# Patient Record
Sex: Female | Born: 1960
Health system: Southern US, Community
[De-identification: ages and names within clinical notes are randomized; demographics above are authoritative.]

## PROBLEM LIST (undated history)

## (undated) DIAGNOSIS — E039 Hypothyroidism, unspecified: Secondary | ICD-10-CM

## (undated) DIAGNOSIS — F32A Depression, unspecified: Secondary | ICD-10-CM

## (undated) DIAGNOSIS — K219 Gastro-esophageal reflux disease without esophagitis: Secondary | ICD-10-CM

## (undated) DIAGNOSIS — F329 Major depressive disorder, single episode, unspecified: Secondary | ICD-10-CM

## (undated) DIAGNOSIS — K5900 Constipation, unspecified: Secondary | ICD-10-CM

## (undated) DIAGNOSIS — F17200 Nicotine dependence, unspecified, uncomplicated: Secondary | ICD-10-CM

## (undated) DIAGNOSIS — C801 Malignant (primary) neoplasm, unspecified: Secondary | ICD-10-CM

## (undated) DIAGNOSIS — E079 Disorder of thyroid, unspecified: Secondary | ICD-10-CM

## (undated) DIAGNOSIS — Z8709 Personal history of other diseases of the respiratory system: Secondary | ICD-10-CM

## (undated) DIAGNOSIS — J302 Other seasonal allergic rhinitis: Secondary | ICD-10-CM

## (undated) DIAGNOSIS — Z923 Personal history of irradiation: Secondary | ICD-10-CM

## (undated) DIAGNOSIS — R0600 Dyspnea, unspecified: Secondary | ICD-10-CM

## (undated) DIAGNOSIS — Z972 Presence of dental prosthetic device (complete) (partial): Secondary | ICD-10-CM

## (undated) HISTORY — DX: Personal history of other diseases of the respiratory system: Z87.09

## (undated) HISTORY — DX: Depression, unspecified: F32.A

## (undated) HISTORY — PX: COLONOSCOPY: SHX174

## (undated) HISTORY — DX: Disorder of thyroid, unspecified: E07.9

## (undated) HISTORY — DX: Nicotine dependence, unspecified, uncomplicated: F17.200

## (undated) HISTORY — PX: TONSILLECTOMY: SHX5217

## (undated) HISTORY — PX: TUBAL LIGATION: SHX77

## (undated) HISTORY — DX: Major depressive disorder, single episode, unspecified: F32.9

---
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
## 2001-02-17 ENCOUNTER — Emergency Department (HOSPITAL_COMMUNITY): Admission: EM | Admit: 2001-02-17 | Discharge: 2001-02-17 | Payer: Self-pay | Admitting: *Deleted

## 2001-02-17 ENCOUNTER — Encounter: Payer: Self-pay | Admitting: *Deleted

## 2001-02-18 ENCOUNTER — Encounter: Payer: Self-pay | Admitting: *Deleted

## 2005-11-18 IMAGING — NM NM THYROID IMAGING W/ UPTAKE SINGLE (24 HR)
4 series · 4 of 4 positions shown · non-contrast
Comparison: none

CLINICAL DATA: NUCLEAR MEDICINE THYROID IMAGING SINGLE UPTAKE:

[Series 1: th thyroid scan · 1.09mm/px · 1 of 1 slices shown (1 of 4)]
[im 1/1]
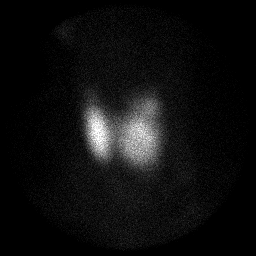

[Series 1: th thyroid scan · 2.39mm/px · 1 of 1 slices shown (2 of 4)]
[im 1/1]
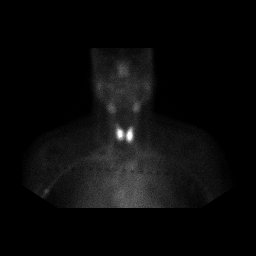

[Series 1: th thyroid scan · 1.09mm/px · 1 of 1 slices shown (3 of 4)]
[im 1/1]
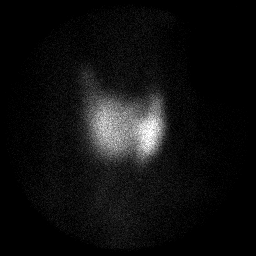

[Series 1: th thyroid scan · 1.09mm/px · 1 of 1 slices shown (4 of 4)]
[im 1/1]
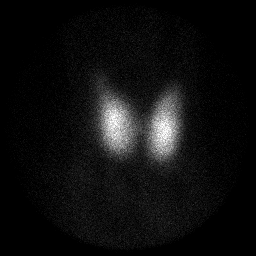

[4 of 4 positions shown; findings below may reference images not displayed]

FINDINGS: After the oral ingestion of 15 uCi [6A] there was found to be a 24 hour uptake of 22.1%, which is within the normal limit of 10-30%.
The patient was then given intravenously 10 mCi [6A] and four scans of the thyroid are made and showed symmetrical uptake of the isotope.  There were no areas of increased or decreased uptake within the slightly prominent thyroid.  The expected activity in the region of the salivary glands was seen.
IMPRESSION:

## 2005-11-20 ENCOUNTER — Ambulatory Visit (HOSPITAL_COMMUNITY): Admission: RE | Admit: 2005-11-20 | Discharge: 2005-11-20 | Payer: Self-pay | Admitting: *Deleted

## 2005-12-01 ENCOUNTER — Ambulatory Visit (HOSPITAL_COMMUNITY): Admission: RE | Admit: 2005-12-01 | Discharge: 2005-12-01 | Payer: Self-pay | Admitting: General Surgery

## 2011-02-11 ENCOUNTER — Other Ambulatory Visit: Payer: Self-pay | Admitting: Family Medicine

## 2011-02-11 DIAGNOSIS — Z139 Encounter for screening, unspecified: Secondary | ICD-10-CM

## 2011-02-19 ENCOUNTER — Ambulatory Visit (HOSPITAL_COMMUNITY): Payer: Self-pay

## 2011-02-24 ENCOUNTER — Ambulatory Visit (HOSPITAL_COMMUNITY)
Admission: RE | Admit: 2011-02-24 | Discharge: 2011-02-24 | Disposition: A | Discharge status: Home / Self Care | Payer: BC Managed Care – PPO | Source: Ambulatory Visit | Attending: Family Medicine | Admitting: Family Medicine

## 2011-02-24 DIAGNOSIS — Z139 Encounter for screening, unspecified: Secondary | ICD-10-CM

## 2011-02-24 DIAGNOSIS — Z1231 Encounter for screening mammogram for malignant neoplasm of breast: Secondary | ICD-10-CM | POA: Insufficient documentation

## 2011-03-03 ENCOUNTER — Ambulatory Visit (HOSPITAL_COMMUNITY)
Admission: RE | Admit: 2011-03-03 | Discharge: 2011-03-03 | Disposition: A | Discharge status: Home / Self Care | Payer: BC Managed Care – PPO | Source: Ambulatory Visit | Attending: General Surgery | Admitting: General Surgery

## 2011-03-03 DIAGNOSIS — Z800 Family history of malignant neoplasm of digestive organs: Secondary | ICD-10-CM | POA: Insufficient documentation

## 2011-03-03 DIAGNOSIS — Z1211 Encounter for screening for malignant neoplasm of colon: Secondary | ICD-10-CM | POA: Insufficient documentation

## 2011-03-03 DIAGNOSIS — K5730 Diverticulosis of large intestine without perforation or abscess without bleeding: Secondary | ICD-10-CM | POA: Insufficient documentation

## 2011-03-09 NOTE — Op Note (Signed)
  Tina Cruz, FIRST                 ACCOUNT NO.:  [id number]  MEDICAL RECORD NO.:  [id number]           PATIENT TYPE:  O  LOCATION:  DAYP                          FACILITY:  [facility]  PHYSICIAN:  Dalia Heading, M.D.  DATE OF BIRTH:  1960/10/28  DATE OF PROCEDURE:  03/03/2011 DATE OF DISCHARGE:                              OPERATIVE REPORT   PREOPERATIVE DIAGNOSIS:  Screening colonoscopy, family history of colon cancer.  POSTOPERATIVE DIAGNOSIS:  Screening colonoscopy, family history of colon cancer, sigmoid diverticulosis.  PROCEDURE:  Colonoscopy.  SURGEON:  Dalia Heading, MD  ANESTHESIA: 1. Demerol 50 mg IV. 2. Versed 4 mg IV  INDICATIONS:  The patient is a 50 year old white female whose mother suffers with colon cancer who now presents for a colonoscopy.  Risks and benefits of the procedure including bleeding and perforation were fully explained to the patient, gave informed consent.  PROCEDURE NOTE:  The patient was placed in the left lateral decubitus position after placement of nasal cannula, pulse oximetry, and noninvasive blood pressure monitoring.  Demerol and Versed were used throughout the procedure for anesthesia.  Rectal examination was performed which was negative.  The colonoscope was advanced to the cecum without difficulty.  Confirmation of placement to the cecum was done using transabdominal palpation of landmarks.  Bowel preparation was adequate.  The cecum was fully visualized and noted to be normal limits. The ascending colon, transverse colon, and proximal descending colon regions were within normal limits.  In the sigmoid colon, some diverticula were seen.  The rectum was within normal limits.  On retroflexion of the colonoscope, the dentate line was inspected and noted to be normal limits.  The colonoscope was returned to the neutral position and all air was evacuated from the colon and rectum prior to removal of the endoscope.  The patient  tolerated the procedure well, was transferred back to Day Surgery in stable condition.  COMPLICATIONS:  None.  SPECIMEN:  None.  RECOMMENDATIONS:  The patient will be given an information sheet on diverticulosis.  A followup colonoscopy is recommended in 5 years given her family history of colon cancer.     Dalia Heading, M.D.     MAJ/MEDQ  D:  03/03/2011  T:  03/03/2011  Job:  [id number]  cc:   [facility].  Electronically Signed by Franky Macho M.D. on 03/09/2011 08:37:57 PM

## 2011-03-16 NOTE — H&P (Signed)
  Tina Cruz, Tina Cruz                 ACCOUNT NO.:  [id number]  MEDICAL RECORD NO.:  [id number]          PATIENT TYPE:  LOCATION:                                 FACILITY:  PHYSICIAN:  Dalia Heading, M.D.  DATE OF BIRTH:  26-Mar-1961  DATE OF ADMISSION:  03/03/2011 DATE OF DISCHARGE:  LH                             HISTORY & PHYSICAL   CHIEF COMPLAINT:  Need for screening colonoscopy, family history of colon cancer.  HISTORY OF PRESENT ILLNESS:  The patient is a 50 year old white female who presents for a screening colonoscopy.  She has a positive family history of colon cancer in her mother and last had a colonoscopy over 5 years ago.  She denies any gastrointestinal complaints.  PAST MEDICAL HISTORY:  Hypothyroidism.  PAST SURGICAL HISTORY:  Tubal ligation.  CURRENT MEDICATIONS:  Synthroid one tablet p.o. daily.  ALLERGIES:  No known drug allergies.  REVIEW OF SYSTEMS:  The patient does smoke a half pack of cigarettes a day.  She denies alcohol or illicit drug use.  She denies any other cardiopulmonary difficulties or bleeding disorders.  Family medical history is positive for colon cancer in her mother.  PHYSICAL EXAMINATION:  GENERAL:  The patient is a well-developed, well- nourished, white female in no acute distress. VITAL SIGNS:  She is afebrile.  Vital signs are stable. HEENT:  Unremarkable. NECK:  Supple without lymphadenopathy. LUNGS:  Clear to auscultation with equal breath sounds bilaterally. HEART:  Reveals regular rate and rhythm without S3, S4, murmurs. ABDOMEN:  Soft, nontender, nondistended.  No hepatosplenomegaly or masses noted. RECTAL:  Deferred to the procedure.  IMPRESSION:  Need for screening colonoscopy, family history of colon cancer.  PLAN:  The patient is scheduled for colonoscopy on Mar 03, 2011.  The risks and benefits of the procedure including bleeding and perforation were fully explained to the patient, gave informed  consent.     Dalia Heading, M.D.     MAJ/MEDQ  D:  02/19/2011  T:  02/20/2011  Job:  [id number]  cc:   [facility]  Electronically Signed by Franky Macho M.D. on 03/16/2011 07:47:03 PM

## 2012-07-22 LAB — HM PAP SMEAR: HM Pap smear: NORMAL

## 2013-02-03 ENCOUNTER — Encounter: Payer: Self-pay | Admitting: Family Medicine

## 2013-02-03 ENCOUNTER — Telehealth: Payer: Self-pay | Admitting: Physician Assistant

## 2013-02-03 DIAGNOSIS — Z8709 Personal history of other diseases of the respiratory system: Secondary | ICD-10-CM | POA: Insufficient documentation

## 2013-02-03 DIAGNOSIS — F329 Major depressive disorder, single episode, unspecified: Secondary | ICD-10-CM | POA: Insufficient documentation

## 2013-02-03 DIAGNOSIS — F17200 Nicotine dependence, unspecified, uncomplicated: Secondary | ICD-10-CM | POA: Insufficient documentation

## 2013-02-03 MED ORDER — LEVOTHYROXINE SODIUM 112 MCG PO TABS: 112.0000 ug | ORAL_TABLET | Freq: Every day | ORAL | Status: DC

## 2013-02-03 NOTE — Telephone Encounter (Signed)
Medication refilled per protocol.Patient needs to be seen before any further refills Pt has appt next week.

## 2013-02-06 ENCOUNTER — Encounter: Payer: Self-pay | Admitting: Physician Assistant

## 2013-02-06 ENCOUNTER — Ambulatory Visit (INDEPENDENT_AMBULATORY_CARE_PROVIDER_SITE_OTHER): Payer: BC Managed Care – PPO | Admitting: Physician Assistant

## 2013-02-06 VITALS — BP 118/82 | HR 76 | Temp 97.4°F | Resp 18 | Ht 65.5 in | Wt 180.0 lb

## 2013-02-06 DIAGNOSIS — F17200 Nicotine dependence, unspecified, uncomplicated: Secondary | ICD-10-CM

## 2013-02-06 DIAGNOSIS — E079 Disorder of thyroid, unspecified: Secondary | ICD-10-CM

## 2013-02-06 NOTE — Progress Notes (Signed)
   Patient ID: Tina Cruz MRN: [id number], DOB: 11/17/1960, 52 y.o. Date of Encounter: 02/06/2013, 5:01 PM    Chief Complaint:  Chief Complaint  Patient presents with  . Medication Refill    thyroid med     HPI: 52 y.o. year old female here to f/u hypothyroid. She is taking med daily as directed. No palpitations, weight change, hair loss, etc. Feels fine with no complaints.     Home Meds: Current Outpatient Prescriptions on File Prior to Visit  Medication Sig Dispense Refill  . levothyroxine (SYNTHROID, LEVOTHROID) 112 MCG tablet Take 1 tablet (112 mcg total) by mouth daily before breakfast.  30 tablet  0   No current facility-administered medications on file prior to visit.    Allergies:  Allergies  Allergen Reactions  . Albuterol Hives and Itching      Review of Systems: See HPi for pertinent ROS. All others negative.   Physical Exam: Blood pressure 118/82, pulse 76, temperature 97.4 F (36.3 C), temperature source Oral, resp. rate 18, height 5' 5.5" (1.664 m), weight 180 lb (81.647 kg)., Body mass index is 29.49 kg/(m^2). General: Well developed, well nourished,WF in no acute distress. Neck: Supple. No thyromegaly. Full ROM. No lymphadenopathy. Lungs: Clear bilaterally to auscultation without wheezes, rales, or rhonchi. Breathing is unlabored. Heart: Regular rhythm. No murmurs, rubs, or gallops. Msk:  Strength and tone normal for age. Extremities/Skin: Warm and dry. No clubbing or cyanosis. No edema. No rashes or suspicious lesions. Neuro: Alert and oriented X 3. Moves all extremities spontaneously. Gait is normal. CNII-XII grossly in tact. Psych:  Responds to questions appropriately with a normal affect.     ASSESSMENT AND PLAN:  52 y.o. year old female with  1. Thyroid disease-Hypothyroidism.  Currently on 112 mcg QD. Check lab and adjust accordingly.  - TSH  2. Active smoker Says she has decreased to 1/2 ppd. (Was 3/4 ppd at LOV 07/1012) . She says she  will use patches if needed-but she will stop on her own --does not want pills (Chantix)   Signed, Shon Hale [place], [place], [facility] 02/06/2013 5:01 PM

## 2013-02-07 LAB — TSH: TSH: 2.411 u[IU]/mL (ref 0.350–4.500)

## 2013-03-14 ENCOUNTER — Telehealth: Payer: Self-pay | Admitting: Family Medicine

## 2013-03-14 MED ORDER — LEVOTHYROXINE SODIUM 112 MCG PO TABS: 112.0000 ug | ORAL_TABLET | Freq: Every day | ORAL | Status: DC

## 2013-03-14 NOTE — Telephone Encounter (Signed)
Medication refilled per protocol. 

## 2013-04-10 ENCOUNTER — Telehealth: Payer: Self-pay | Admitting: Family Medicine

## 2013-04-11 ENCOUNTER — Telehealth: Payer: Self-pay | Admitting: Family Medicine

## 2013-04-11 MED ORDER — LEVOTHYROXINE SODIUM 112 MCG PO TABS: 112.0000 ug | ORAL_TABLET | Freq: Every day | ORAL | Status: DC

## 2013-04-11 NOTE — Telephone Encounter (Signed)
Rx Refilled  

## 2013-08-08 ENCOUNTER — Ambulatory Visit: Payer: BC Managed Care – PPO | Admitting: Family Medicine

## 2013-08-09 ENCOUNTER — Encounter: Payer: Self-pay | Admitting: Physician Assistant

## 2013-08-09 ENCOUNTER — Ambulatory Visit (INDEPENDENT_AMBULATORY_CARE_PROVIDER_SITE_OTHER): Payer: BC Managed Care – PPO | Admitting: Physician Assistant

## 2013-08-09 VITALS — BP 124/86 | HR 68 | Temp 98.2°F | Resp 18 | Wt 181.0 lb

## 2013-08-09 DIAGNOSIS — E079 Disorder of thyroid, unspecified: Secondary | ICD-10-CM

## 2013-08-09 DIAGNOSIS — Z1239 Encounter for other screening for malignant neoplasm of breast: Secondary | ICD-10-CM

## 2013-08-09 NOTE — Progress Notes (Signed)
   Patient ID: Tina Cruz MRN: [id number], DOB: 1961/08/20, 52 y.o. Date of Encounter: 08/09/2013, 2:07 PM    Chief Complaint:  Chief Complaint  Patient presents with  . 6 mth check up    not fasting     HPI: 52 y.o. year old white female here for routine follow up of hypothyroidism.  She is taking her thyroid supplement at 112 mcg daily. She has had no significant changes in her weight. No significant change in energy level. No changes in hair or skin.  She did quit smoking in August 2013. Says that she used Nicorette gum to help with cessation.  She has no complaints today.  Home Meds: See attached medication section for any medications that were entered at today's visit. The computer does not put those onto this list.The following list is a list of meds entered prior to today's visit.   Current Outpatient Prescriptions on File Prior to Visit  Medication Sig Dispense Refill  . Ibuprofen (ADVIL PO) Take 1 tablet by mouth as needed (220mg  OTC).      Marland Kitchen levothyroxine (SYNTHROID, LEVOTHROID) 112 MCG tablet Take 1 tablet (112 mcg total) by mouth daily before breakfast.  30 tablet  6  . ranitidine (ZANTAC) 150 MG tablet Take 150 mg by mouth as needed for heartburn (not RX pt buys OTC).       No current facility-administered medications on file prior to visit.    Allergies:  Allergies  Allergen Reactions  . Albuterol Hives and Itching      Review of Systems: See HPI for pertinent ROS. All other ROS negative.    Physical Exam: Blood pressure 124/86, pulse 68, temperature 98.2 F (36.8 C), temperature source Oral, resp. rate 18, weight 181 lb (82.101 kg)., Body mass index is 29.65 kg/(m^2). General: WNWD WF. Appears in no acute distress. Neck: Supple. No thyromegaly. No lymphadenopathy. No carotid bruits. Lungs: Clear bilaterally to auscultation without wheezes, rales, or rhonchi. Breathing is unlabored. Heart: Regular rhythm. No murmurs, rubs, or gallops. Msk:  Strength  and tone normal for age. Extremities/Skin: Warm and dry.  No edema. No rashes or suspicious lesions. Neuro: Alert and oriented X 3. Moves all extremities spontaneously. Gait is normal. CNII-XII grossly in tact. Psych:  Responds to questions appropriately with a normal affect.     ASSESSMENT AND PLAN:  52 y.o. year old female with  1. Thyroid disease - TSH  2. Congratulations on the  smoking cessation!!  3. she had a complete physical exam by me 07/22/12.  4. mammogram: Last was May 2012.  At Her physical last year we discussed that she needed to follow this up. She says every time she tried to call any [facility] she could never get anyone to answer her phone calls. I  will go ahead and schedule it for her.  Breast cancer screening - MM Digital Screening; Future  5. pelvic exam/Pap smear was done 07/22/12. Discussed need for a normal pelvic exam. He defers now.  6. Colonoscopy: this was done May 2012. Normal. Positive family history. Repeat 5 years. Due May 2017.  7. immunizations: T.dap  Was given 01/2011 Commended influenza vaccine but she defers.  ROV 6 months, sooner if needed.    Murray Hodgkins [place], [place], [facility] 08/09/2013 2:07 PM

## 2013-08-10 ENCOUNTER — Encounter: Payer: Self-pay | Admitting: Family Medicine

## 2013-08-22 ENCOUNTER — Inpatient Hospital Stay (HOSPITAL_COMMUNITY): Admission: RE | Admit: 2013-08-22 | Payer: BC Managed Care – PPO | Source: Ambulatory Visit

## 2013-08-26 ENCOUNTER — Encounter: Payer: Self-pay | Admitting: *Deleted

## 2013-12-23 ENCOUNTER — Other Ambulatory Visit: Payer: Self-pay | Admitting: Family Medicine

## 2013-12-29 ENCOUNTER — Telehealth: Payer: Self-pay | Admitting: Family Medicine

## 2013-12-29 MED ORDER — LEVOTHYROXINE SODIUM 112 MCG PO TABS: ORAL_TABLET | ORAL | Status: DC

## 2013-12-29 NOTE — Telephone Encounter (Signed)
Rx Refilled and pt aware

## 2013-12-29 NOTE — Telephone Encounter (Signed)
Pharmacy [facility] [place] Call back number [phone] Pt is needing a refill on her levothyroxine (SYNTHROID, LEVOTHROID) 112 MCG tablet ---pt wants to know if she needs to move her dr apt up so she can get her meds refilled

## 2014-02-07 ENCOUNTER — Ambulatory Visit (INDEPENDENT_AMBULATORY_CARE_PROVIDER_SITE_OTHER): Payer: BC Managed Care – PPO | Admitting: Physician Assistant

## 2014-02-07 ENCOUNTER — Encounter: Payer: Self-pay | Admitting: Physician Assistant

## 2014-02-07 VITALS — BP 120/82 | HR 80 | Temp 97.8°F | Resp 18 | Ht 66.0 in | Wt 178.0 lb

## 2014-02-07 DIAGNOSIS — F17200 Nicotine dependence, unspecified, uncomplicated: Secondary | ICD-10-CM

## 2014-02-07 DIAGNOSIS — Z23 Encounter for immunization: Secondary | ICD-10-CM

## 2014-02-07 DIAGNOSIS — E079 Disorder of thyroid, unspecified: Secondary | ICD-10-CM

## 2014-02-07 DIAGNOSIS — Z9229 Personal history of other drug therapy: Secondary | ICD-10-CM

## 2014-02-07 NOTE — Progress Notes (Signed)
Patient ID: Tina Cruz MRN: [id number], DOB: 05-Jan-1961, 53 y.o. Date of Encounter: 02/07/2014, 4:19 PM    Chief Complaint:  Chief Complaint  Patient presents with  . Follow-up    6 mos, pt declined PNA shot today      HPI: 53 y.o. year old white female here for routine follow up of hypothyroidism.  She is taking her thyroid supplement at 112 mcg daily. She has had no significant changes in her weight. No significant change in energy level. No changes in hair or skin.  Her last office visit with me she reported that she had quit smoking. Unfortunately, today she reports that she has started back to smoking. Reports that she stayed quit for about 4 months but started back smoking about 2 months ago. Currently  is smoking almost a half pack per day.  She has no complaints today.  Home Meds: See attached medication section for any medications that were entered at today's visit. The computer does not put those onto this list.The following list is a list of meds entered prior to today's visit.   Current Outpatient Prescriptions on File Prior to Visit  Medication Sig Dispense Refill  . Ibuprofen (ADVIL PO) Take 1 tablet by mouth as needed (220mg  OTC).      Marland Kitchen levothyroxine (SYNTHROID, LEVOTHROID) 112 MCG tablet TAKE 1 TABLET BY MOUTH DAILY BEFORE BREAKFAST.  30 tablet  1  . ranitidine (ZANTAC) 150 MG tablet Take 150 mg by mouth as needed for heartburn (not RX pt buys OTC).       No current facility-administered medications on file prior to visit.    Allergies:  Allergies  Allergen Reactions  . Albuterol Hives and Itching      Review of Systems: See HPI for pertinent ROS. All other ROS negative.    Physical Exam: Blood pressure 120/82, pulse 80, temperature 97.8 F (36.6 C), temperature source Oral, resp. rate 18, height 5\' 6"  (1.676 m), weight 178 lb (80.74 kg)., Body mass index is 28.74 kg/(m^2). General: WNWD WF. Appears in no acute distress. Neck: Supple. No  thyromegaly. No lymphadenopathy. No carotid bruits. Lungs: Clear bilaterally to auscultation without wheezes, rales, or rhonchi. Breathing is unlabored. Heart: Regular rhythm. No murmurs, rubs, or gallops. Msk:  Strength and tone normal for age. Extremities/Skin: Warm and dry.  No edema. No rashes or suspicious lesions. Neuro: Alert and oriented X 3. Moves all extremities spontaneously. Gait is normal. CNII-XII grossly in tact. Psych:  Responds to questions appropriately with a normal affect.     ASSESSMENT AND PLAN:  53 y.o. year old female with  1. Thyroid disease - TSH  2. Smoker:  Discussed prescribing her medication to help her with cessation. She defers. Says she will try the Nicorette gum again. Discussed need for Pneumovax given that she is a smoker. She is agreeable to receive Pneumovax today.  3. she had a complete physical exam by me 07/22/12.  4. mammogram: Last was May 2012.  At Her physical last year we discussed that she needed to follow this up.  At her LOV with me 08/2013 She said every time she tried to call any [facility] she could never get anyone to answer her phone calls. At that visit, I placed order for mammogram.  Today patient reports that she canceled that appointment and never did schedule another mammogram. Discussed with her again today the need for annual mammograms. She is well aware of risks versus benefits of having the mammogram  performed.  5. pelvic exam/Pap smear was done 07/22/12. Pap 07/2012: Cytology alone was performed. Did not do HPV cotesting. Cytology was negative. --Should have f/u Pap 3 years.--Due 07/2015 Discussed need for annual pelvic exam. She defers now.  6. Colonoscopy: this was done May 2012. Normal. Positive family history. Repeat 5 years. Due May 2017.  7. immunizations: T.dap  Was given 01/2011 Recommended influenza vaccine at [place] 08/2013 but she deferred. Pneumonia Vaccine: discussed that given her smoking, she needs to get  a Pneumovax today. Discussed that she would not require any further pneumonia vaccine until age 53.  She is agreeable to receive the Pneumovax today.  8. lipid panel was excellent at lab 07/2012. Triglyceride 101      HDL 45    LDL 99. Can wait  3 - 5 years to repeat.  ROV 6 months, sooner if needed.    [address] [place], [place], [facility] 02/07/2014 4:19 PM

## 2014-02-08 LAB — TSH: TSH: 1.808 u[IU]/mL (ref 0.350–4.500)

## 2014-02-09 ENCOUNTER — Encounter: Payer: Self-pay | Admitting: Family Medicine

## 2014-02-09 ENCOUNTER — Other Ambulatory Visit: Payer: Self-pay | Admitting: Family Medicine

## 2014-02-09 MED ORDER — LEVOTHYROXINE SODIUM 112 MCG PO TABS: ORAL_TABLET | ORAL | Status: DC

## 2014-07-09 ENCOUNTER — Ambulatory Visit: Payer: BC Managed Care – PPO | Admitting: Physician Assistant

## 2014-07-11 ENCOUNTER — Encounter: Payer: Self-pay | Admitting: Physician Assistant

## 2014-07-11 ENCOUNTER — Ambulatory Visit (INDEPENDENT_AMBULATORY_CARE_PROVIDER_SITE_OTHER): Payer: BC Managed Care – PPO | Admitting: Physician Assistant

## 2014-07-11 VITALS — BP 124/86 | HR 72 | Temp 98.2°F | Resp 18 | Wt 168.0 lb

## 2014-07-11 DIAGNOSIS — F17200 Nicotine dependence, unspecified, uncomplicated: Secondary | ICD-10-CM

## 2014-07-11 DIAGNOSIS — Z1239 Encounter for other screening for malignant neoplasm of breast: Secondary | ICD-10-CM

## 2014-07-11 DIAGNOSIS — E079 Disorder of thyroid, unspecified: Secondary | ICD-10-CM

## 2014-07-11 NOTE — Progress Notes (Signed)
Patient ID: Tina Cruz MRN: [id number], DOB: [birth year], 53 y.o. Date of Encounter: 07/11/2014, 4:30 PM    Chief Complaint:  Chief Complaint  Patient presents with  . 6 mth check up    refill thyroid meds     HPI: 53 y.o. year old white female here for routine follow up of hypothyroidism.  She is taking her thyroid supplement at 112 mcg daily. She has had no significant changes in her weight. No significant change in energy level. No changes in hair or skin.  At prior office visit with me she reported that she had quit smoking. Unfortunately, at LOV--01/2014-- she reported that she had started back to smoking. Reported that she stayed quit for about 4 months but started back smoking about 2 months prior to that Ray 01/2014. At that 12/2013 OV she was smoking almost a half pack per day. At that visit I discussed prescribing medication to help with smoking cessation. However she deferred and said that she would try the nicotine gum. Today she reports that she is still smoking and is currently smoking about 3/4 ppd.   She has no complaints today.  Home Meds:   Outpatient Prescriptions Prior to Visit  Medication Sig Dispense Refill  . Ibuprofen (ADVIL PO) Take 1 tablet by mouth as needed (220mg  OTC).      Marland Kitchen levothyroxine (SYNTHROID, LEVOTHROID) 112 MCG tablet TAKE 1 TABLET BY MOUTH DAILY BEFORE BREAKFAST.  30 tablet  5  . ranitidine (ZANTAC) 150 MG tablet Take 150 mg by mouth as needed for heartburn (not RX pt buys OTC).       No facility-administered medications prior to visit.     Allergies:  Allergies  Allergen Reactions  . Albuterol Hives and Itching      Review of Systems: See HPI for pertinent ROS. All other ROS negative.    Physical Exam: Blood pressure 124/86, pulse 72, temperature 98.2 F (36.8 C), temperature source Oral, resp. rate 18, weight 168 lb (76.204 kg)., Body mass index is 27.13 kg/(m^2). General: WNWD WF. Appears in no acute distress. Neck: Supple.  No thyromegaly. No lymphadenopathy. No carotid bruits. Lungs: Clear bilaterally to auscultation without wheezes, rales, or rhonchi. Breathing is unlabored. Heart: Regular rhythm. No murmurs, rubs, or gallops. Msk:  Strength and tone normal for age. Extremities/Skin: Warm and dry.  No edema.  Neuro: Alert and oriented X 3. Moves all extremities spontaneously. Gait is normal. CNII-XII grossly in tact. Psych:  Responds to questions appropriately with a normal affect.     ASSESSMENT AND PLAN:  53 y.o. year old female with  1. Thyroid disease - TSH  2. Smoker:  Again today, I discusse prescribing her medication to help her with cessation. Again today, she defers.  01/2014--Pneumovax 23 was given here.   3. she had a complete physical exam by me 07/22/12.  4. mammogram: Last was May 2012.  At Her physical 08/2012 we discussed that she needed to follow this up.  At her OV with me 08/2013 She said every time she tried to call any [facility] she could never get anyone to answer her phone calls. At that visit, I placed order for mammogram.  At [place] 01/2014 patient reported that she canceled that appointment and never did schedule another mammogram. Discussed with her again at 01/2014 the need for annual mammograms.At that visit  she was informed of risks versus benefits of having the mammogram performed. Today 07/11/2014--patient says that she never did have followup mammogram.  I discussed this again today and she is agreeable for me to schedule another mammogram.  Breast cancer screening - MM Digital Screening; Future    5. pelvic exam/Pap smear was done 07/22/12. Pap 07/2012: Cytology alone was performed. Did not do HPV cotesting. Cytology was negative. --Should have f/u Pap 3 years.--Due 07/2015 Discussed need for annual pelvic exam. She defers now.  6. Colonoscopy: this was done May 2012. Normal. Positive family history. Repeat 5 years. Due May 2017.  7. immunizations: T.dap  Was given  01/2011 Recommended influenza vaccine at [place] 08/2013 but she deferred. Recommended again at office visit 06/2014 but she defers. Pneumonia Vaccine: 01/2014--discussed that given her smoking, she needed to get a Pneumovax -s-he was agreeable. Pneumovax 23 given 01/2014.Marland Kitchen Discussed that she would not require any further pneumonia vaccine until age 64.    8. lipid panel was excellent at lab 07/2012.      Triglyceride 101      HDL 45    LDL 99.     Can wait  3 - 5 years to repeat.  ROV 6 months, sooner if needed.     [address] [place], [place], [facility] 07/11/2014 4:30 PM

## 2014-07-12 LAB — TSH: TSH: 0.736 u[IU]/mL (ref 0.350–4.500)

## 2014-07-17 ENCOUNTER — Telehealth: Payer: Self-pay | Admitting: Family Medicine

## 2014-07-17 ENCOUNTER — Encounter: Payer: Self-pay | Admitting: Family Medicine

## 2014-07-17 MED ORDER — LEVOTHYROXINE SODIUM 112 MCG PO TABS: ORAL_TABLET | ORAL | Status: DC

## 2014-07-17 NOTE — Telephone Encounter (Signed)
Letter sent to patient with normal lab results.  RF thyroid med to pharm  90 + 1

## 2014-07-17 NOTE — Telephone Encounter (Signed)
Message copied by Olena Mater on Tue Jul 17, 2014  5:07 PM ------      Message from: Dena Billet      Created: Fri Jul 13, 2014  3:09 PM       TSH normal. Continue current dose. Send refills to last 6 months on thyroid medication. ------

## 2014-07-23 ENCOUNTER — Ambulatory Visit
Admission: RE | Admit: 2014-07-23 | Discharge: 2014-07-23 | Disposition: A | Discharge status: Home / Self Care | Payer: BC Managed Care – PPO | Source: Ambulatory Visit | Attending: Physician Assistant | Admitting: Physician Assistant

## 2014-07-23 DIAGNOSIS — Z1239 Encounter for other screening for malignant neoplasm of breast: Secondary | ICD-10-CM

## 2014-09-05 ENCOUNTER — Encounter: Payer: Self-pay | Admitting: Physician Assistant

## 2015-01-09 ENCOUNTER — Ambulatory Visit: Payer: BC Managed Care – PPO | Admitting: Physician Assistant

## 2015-01-14 ENCOUNTER — Ambulatory Visit (INDEPENDENT_AMBULATORY_CARE_PROVIDER_SITE_OTHER): Payer: BLUE CROSS/BLUE SHIELD | Admitting: Physician Assistant

## 2015-01-14 ENCOUNTER — Encounter: Payer: Self-pay | Admitting: Physician Assistant

## 2015-01-14 VITALS — BP 122/78 | HR 76 | Temp 98.5°F | Resp 18 | Wt 179.0 lb

## 2015-01-14 DIAGNOSIS — E079 Disorder of thyroid, unspecified: Secondary | ICD-10-CM | POA: Diagnosis not present

## 2015-01-14 DIAGNOSIS — F17200 Nicotine dependence, unspecified, uncomplicated: Secondary | ICD-10-CM

## 2015-01-14 DIAGNOSIS — K219 Gastro-esophageal reflux disease without esophagitis: Secondary | ICD-10-CM | POA: Insufficient documentation

## 2015-01-14 DIAGNOSIS — Z720 Tobacco use: Secondary | ICD-10-CM | POA: Diagnosis not present

## 2015-01-14 NOTE — Progress Notes (Signed)
Patient ID: EVANGELINE UTLEY MRN: [id number], DOB: 1961-08-19, 54 y.o. Date of Encounter: 01/14/2015, 4:27 PM    Chief Complaint:  Chief Complaint  Patient presents with  . 6 mth check up thyroid     HPI: 54 y.o. year old white female here for routine follow up of hypothyroidism.  She is taking her thyroid supplement at 112 mcg daily. She has had no significant changes in her weight. No significant change in energy level. No changes in hair or skin.  At prior office visit with me she reported that she had quit smoking. Unfortunately, at OV--01/2014-- she reported that she had started back to smoking. Reported that she stayed quit for about 4 months but started back smoking about 2 months prior to that [place] 01/2014. At that 12/2013 OV she was smoking almost a half pack per day. At that visit I discussed prescribing medication to help with smoking cessation. However she deferred and said that she would try the nicotine gum. At [place] 06/2014 smoking about three fourths pack per day. Today she reports that she is still smoking and is currently smoking about 1/2 ppd.   She has no complaints today.  Home Meds:   Outpatient Prescriptions Prior to Visit  Medication Sig Dispense Refill  . Ibuprofen (ADVIL PO) Take 1 tablet by mouth as needed (220mg  OTC).    Marland Kitchen levothyroxine (SYNTHROID, LEVOTHROID) 112 MCG tablet TAKE 1 TABLET BY MOUTH DAILY BEFORE BREAKFAST. 90 tablet 1  . ranitidine (ZANTAC) 150 MG tablet Take 150 mg by mouth as needed for heartburn (not RX pt buys OTC).     No facility-administered medications prior to visit.     Allergies:  Allergies  Allergen Reactions  . Albuterol Hives and Itching      Review of Systems: See HPI for pertinent ROS. All other ROS negative.    Physical Exam: Blood pressure 122/78, pulse 76, temperature 98.5 F (36.9 C), temperature source Oral, resp. rate 18, weight 179 lb (81.194 kg)., Body mass index is 28.91 kg/(m^2). General: WNWD WF. Appears in  no acute distress. Neck: Supple. No thyromegaly. No lymphadenopathy. No carotid bruits. Lungs: Clear bilaterally to auscultation without wheezes, rales, or rhonchi. Breathing is unlabored. Heart: Regular rhythm. No murmurs, rubs, or gallops. Msk:  Strength and tone normal for age. Extremities/Skin: Warm and dry.  No edema.  Neuro: Alert and oriented X 3. Moves all extremities spontaneously. Gait is normal. CNII-XII grossly in tact. Psych:  Responds to questions appropriately with a normal affect.     ASSESSMENT AND PLAN:  54 y.o. year old female with  1. Thyroid disease - TSH  2. Smoker:  Again today, I discusse prescribing her medication to help her with cessation. Again today, she defers.  01/2014--Pneumovax 23 was given here. At office visit 01/14/15 discussed getting screening chest x-ray. She says she wants to call her insurance regarding cost first and then will get back with me if she wants to have this performed.   3. she had a complete physical exam by me 07/22/12.  4. Breast cancer screening/ mammogram: Last was May 2012.  At Her physical 08/2012 we discussed that she needed to follow this up.  At her OV with me 08/2013 She said every time she tried to call any [facility] she could never get anyone to answer her phone calls. At that visit, I placed order for mammogram.  At [place] 01/2014 patient reported that she canceled that appointment and never did schedule another mammogram.  Discussed with her again at 01/2014 the need for annual mammograms.At that visit  she was informed of risks versus benefits of having the mammogram performed. At [place] 07/11/2014--patient says that she never did have followup mammogram. I discussed this again today and she is agreeable for me to schedule another mammogram. At [place] 01/14/15--she states she did have mammogram but this time has it at the breast center.   5. pelvic exam/Pap smear was done 07/22/12. Pap 07/2012: Cytology alone was performed. Did not  do HPV cotesting. Cytology was negative. --Should have f/u Pap 3 years.--Due 07/2015 Discussed need for annual pelvic exam. She defers now.  6. Colonoscopy: this was done May 2012. Normal. Positive family history. Repeat 5 years. Due May 2017.  7. immunizations: Tdap--  Was given 01/2011 Recommended influenza vaccine at [place] 08/2013 but she deferred. Recommended again at office visit 06/2014 but she defers. Pneumonia Vaccine: 01/2014--discussed that given her smoking, she needed to get a Pneumovax -she was agreeable. Pneumovax 23 given 01/2014.Marland Kitchen    ---No further pneumonia vaccine until age 4.    8. lipid panel was excellent at lab 07/2012.      Triglyceride 101      HDL 45    LDL 99.     Can wait  3 - 5 years to repeat.  ROV 6 months, sooner if needed.     Marin Olp [place], [place], [facility] 01/14/2015 4:27 PM

## 2015-01-15 LAB — TSH: TSH: 2.21 u[IU]/mL (ref 0.350–4.500)

## 2015-01-16 ENCOUNTER — Telehealth: Payer: Self-pay | Admitting: Family Medicine

## 2015-01-16 DIAGNOSIS — E039 Hypothyroidism, unspecified: Secondary | ICD-10-CM

## 2015-01-16 MED ORDER — LEVOTHYROXINE SODIUM 112 MCG PO TABS: ORAL_TABLET | ORAL | Status: DC

## 2015-01-16 NOTE — Telephone Encounter (Signed)
Pt aware of lab results and refill thyroid med to pharmacy

## 2015-01-16 NOTE — Telephone Encounter (Signed)
-----   Message from Orlena Sheldon, PA-C sent at 01/15/2015 10:26 AM EDT ----- Lab is normal. Continue current dose of thyroid medication.

## 2015-04-13 ENCOUNTER — Other Ambulatory Visit: Payer: Self-pay | Admitting: Physician Assistant

## 2015-04-16 NOTE — Telephone Encounter (Signed)
Medication refilled per protocol. 

## 2015-07-11 ENCOUNTER — Other Ambulatory Visit: Payer: Self-pay | Admitting: Physician Assistant

## 2015-07-11 NOTE — Telephone Encounter (Signed)
Refill appropriate and filled per protocol. 

## 2015-07-15 ENCOUNTER — Other Ambulatory Visit: Payer: Self-pay

## 2015-07-15 DIAGNOSIS — Z1231 Encounter for screening mammogram for malignant neoplasm of breast: Secondary | ICD-10-CM

## 2015-07-17 ENCOUNTER — Encounter: Payer: Self-pay | Admitting: Physician Assistant

## 2015-07-17 ENCOUNTER — Ambulatory Visit (INDEPENDENT_AMBULATORY_CARE_PROVIDER_SITE_OTHER): Payer: 59 | Admitting: Physician Assistant

## 2015-07-17 VITALS — BP 116/75 | HR 60 | Temp 98.4°F | Resp 18 | Wt 189.0 lb

## 2015-07-17 DIAGNOSIS — E079 Disorder of thyroid, unspecified: Secondary | ICD-10-CM | POA: Diagnosis not present

## 2015-07-17 DIAGNOSIS — K219 Gastro-esophageal reflux disease without esophagitis: Secondary | ICD-10-CM

## 2015-07-17 LAB — TSH: TSH: 1.578 u[IU]/mL (ref 0.350–4.500)

## 2015-07-17 NOTE — Progress Notes (Signed)
Patient ID: SELICIA WINDOM MRN: [id number], DOB: [birth year], 54 y.o. Date of Encounter: 07/17/2015, 10:39 AM    Chief Complaint:  Chief Complaint  Patient presents with  . 6 mth follow up    is fasting     HPI: 54 y.o. year old white female here for routine follow up of hypothyroidism.  She is taking her thyroid supplement at 112 mcg daily. She has had no significant changes in her weight. No significant change in energy level. No changes in hair or skin.  At prior office visit with me she reported that she had quit smoking. Unfortunately, at OV--01/2014-- she reported that she had started back to smoking. Reported that she stayed quit for about 4 months but started back smoking about 2 months prior to that [place] 01/2014. At that 12/2013 OV she was smoking almost a half pack per day. At that visit I discussed prescribing medication to help with smoking cessation. However she deferred and said that she would try the nicotine gum. At [place] 06/2014 smoking about three fourths pack per day. At [place] 01/2015--she reports that she is still smoking and is currently smoking about 1/2 ppd.  Phebe Colla!! At [place] 07/17/2015--- she reports that she quit smoking August 22. Says she use the Nicorette gum for just 1-2 weeks and otherwise has been good with no assistance.  Says that she wants to get back to walking and riding her bike like she used to do in the past. Says that her lungs are clearing out and she plans to start this up soon.  She has no complaints today.  Home Meds:   Outpatient Prescriptions Prior to Visit  Medication Sig Dispense Refill  . Ibuprofen (ADVIL PO) Take 1 tablet by mouth as needed ('220mg'$  OTC).    Marland Kitchen levothyroxine (SYNTHROID, LEVOTHROID) 112 MCG tablet TAKE 1 TABLET BY MOUTH DAILY BEFORE BREAKFAST. 90 tablet 1  . ranitidine (ZANTAC) 150 MG tablet Take 150 mg by mouth as needed for heartburn (not RX pt buys OTC).    Marland Kitchen levothyroxine (SYNTHROID, LEVOTHROID) 112 MCG tablet TAKE 1 TABLET BY MOUTH  DAILY BEFORE BREAKFAST. 90 tablet 1   No facility-administered medications prior to visit.     Allergies:  Allergies  Allergen Reactions  . Albuterol Hives and Itching      Review of Systems: See HPI for pertinent ROS. All other ROS negative.    Physical Exam: Blood pressure 116/75, pulse 60, temperature 98.4 F (36.9 C), temperature source Oral, resp. rate 18, weight 189 lb (85.73 kg)., Body mass index is 30.52 kg/(m^2). General: WNWD WF. Appears in no acute distress. Neck: Supple. No thyromegaly. No lymphadenopathy. No carotid bruits. Lungs: Clear bilaterally to auscultation without wheezes, rales, or rhonchi. Breathing is unlabored. Heart: Regular rhythm. No murmurs, rubs, or gallops. Msk:  Strength and tone normal for age. Extremities/Skin: Warm and dry.  No edema.  Neuro: Alert and oriented X 3. Moves all extremities spontaneously. Gait is normal. CNII-XII grossly in tact. Psych:  Responds to questions appropriately with a normal affect.     ASSESSMENT AND PLAN:  54 y.o. year old female with  1. Thyroid disease - TSH  2. Smoker:  Again today, I discusse prescribing her medication to help her with cessation. Again today, she defers.  01/2014--Pneumovax 23 was given here. At office visit 01/14/15 discussed getting screening chest x-ray. She says she wants to call her insurance regarding cost first and then will get back with me if she wants to have  this performed. 07/17/2015---CONGRATULATIONS ON CESSATION!!!  3. she had a complete physical exam by me 07/22/12.  4. Breast cancer screening/ mammogram: Last was May 2012.  At Her physical 08/2012 we discussed that she needed to follow this up.  At her OV with me 08/2013 She said every time she tried to call any [facility] she could never get anyone to answer her phone calls. At that visit, I placed order for mammogram.  At [place] 01/2014 patient reported that she canceled that appointment and never did schedule another  mammogram. Discussed with her again at 01/2014 the need for annual mammograms.At that visit  she was informed of risks versus benefits of having the mammogram performed. At [place] 07/11/2014--patient says that she never did have followup mammogram. I discussed this again today and she is agreeable for me to schedule another mammogram. At [place] 01/14/15--she states she did have mammogram but this time has it at the breast center. At [place] 07/17/15 she states that she has a mammogram scheduled for October 14.   5. pelvic exam/Pap smear was done 07/22/12. Pap 07/2012: Cytology alone was performed. Did not do HPV cotesting. Cytology was negative. --Should have f/u Pap 3 years.--Due 07/2015 Discussed need for annual pelvic exam. She defers now.  6. Colonoscopy: this was done May 2012. Normal. Positive family history. Repeat 5 years. Due May 2017.  7. immunizations: Tdap--  Was given 01/2011 Recommended influenza vaccine at [place] 08/2013 but she deferred. Recommended again at office visit 06/2014 but she defers. Recommended again at visit 07/17/15 but she again defers. Pneumonia Vaccine: 01/2014--discussed that given her smoking, she needed to get a Pneumovax -she was agreeable. Pneumovax 23 given 01/2014.Marland Kitchen    ---No further pneumonia vaccine until age 80.    8. lipid panel was excellent at lab 07/2012.      Triglyceride 101      HDL 45    LDL 99.     Can wait  3 - 5 years to repeat.  ROV 6 months, sooner if needed.     Signed, [id number] [place], [place], [facility] 07/17/2015 10:39 AM

## 2015-07-18 ENCOUNTER — Encounter: Payer: Self-pay | Admitting: Family Medicine

## 2015-07-26 ENCOUNTER — Ambulatory Visit
Admission: RE | Admit: 2015-07-26 | Discharge: 2015-07-26 | Disposition: A | Discharge status: Home / Self Care | Payer: 59 | Source: Ambulatory Visit

## 2015-07-26 DIAGNOSIS — Z1231 Encounter for screening mammogram for malignant neoplasm of breast: Secondary | ICD-10-CM

## 2016-01-16 ENCOUNTER — Encounter: Payer: Self-pay | Admitting: Physician Assistant

## 2016-01-16 ENCOUNTER — Ambulatory Visit (INDEPENDENT_AMBULATORY_CARE_PROVIDER_SITE_OTHER): Payer: 59 | Admitting: Physician Assistant

## 2016-01-16 VITALS — BP 122/84 | HR 68 | Temp 98.0°F | Resp 18 | Ht 66.0 in | Wt 188.0 lb

## 2016-01-16 DIAGNOSIS — Z720 Tobacco use: Secondary | ICD-10-CM

## 2016-01-16 DIAGNOSIS — K219 Gastro-esophageal reflux disease without esophagitis: Secondary | ICD-10-CM

## 2016-01-16 DIAGNOSIS — F17200 Nicotine dependence, unspecified, uncomplicated: Secondary | ICD-10-CM

## 2016-01-16 DIAGNOSIS — E079 Disorder of thyroid, unspecified: Secondary | ICD-10-CM | POA: Diagnosis not present

## 2016-01-16 LAB — TSH: TSH: 2.51 m[IU]/L

## 2016-01-16 NOTE — Progress Notes (Signed)
Patient ID: TERSA FOTOPOULOS MRN: [id number], DOB: 05-24-1961, 55 y.o. Date of Encounter: 01/16/2016, 9:32 AM    Chief Complaint:  Chief Complaint  Patient presents with  . 6 mth check up    had coffee w/sugar     HPI: 55 y.o. year old white female here for routine follow up of hypothyroidism.  She is taking her thyroid supplement at 112 mcg daily. She has had no significant changes in her weight. No significant change in energy level. No changes in hair or skin.  At prior office visit with me she reported that she had quit smoking. Unfortunately, at OV--01/2014-- she reported that she had started back to smoking. Reported that she stayed quit for about 4 months but started back smoking about 2 months prior to that [place] 01/2014. At that 12/2013 OV she was smoking almost a half pack per day. At that visit I discussed prescribing medication to help with smoking cessation. However she deferred and said that she would try the nicotine gum. At [place] 06/2014 smoking about three fourths pack per day. At [place] 01/2015--she reports that she is still smoking and is currently smoking about 1/2 ppd.  Phebe Colla!! At [place] 07/17/2015--- she reports that she quit smoking August 22. Says she use the Nicorette gum for just 1-2 weeks and otherwise has been good with no assistance.  Says that she wants to get back to walking and riding her bike like she used to do in the past. Says that her lungs are clearing out and she plans to start this up soon. YAY! At [place] 01/16/2016---Reports that she still is not smoking!! She has no complaints today.  Home Meds:   Outpatient Prescriptions Prior to Visit  Medication Sig Dispense Refill  . Ibuprofen (ADVIL PO) Take 1 tablet by mouth as needed ('220mg'$  OTC).    Marland Kitchen levothyroxine (SYNTHROID, LEVOTHROID) 112 MCG tablet TAKE 1 TABLET BY MOUTH DAILY BEFORE BREAKFAST. 90 tablet 1  . ranitidine (ZANTAC) 150 MG tablet Take 150 mg by mouth as needed for heartburn (not RX pt buys OTC).     No  facility-administered medications prior to visit.     Allergies:  Allergies  Allergen Reactions  . Albuterol Hives and Itching      Review of Systems: See HPI for pertinent ROS. All other ROS negative.    Physical Exam: Blood pressure 122/84, pulse 68, temperature 98 F (36.7 C), temperature source Oral, resp. rate 18, height '5\' 6"'$  (1.676 m), weight 188 lb (85.276 kg)., Body mass index is 30.36 kg/(m^2). General: WNWD WF. Appears in no acute distress. Neck: Supple. No thyromegaly. No lymphadenopathy. No carotid bruits. Lungs: Clear bilaterally to auscultation without wheezes, rales, or rhonchi. Breathing is unlabored. Heart: Regular rhythm. No murmurs, rubs, or gallops. Msk:  Strength and tone normal for age. Extremities/Skin: Warm and dry.  No edema.  Neuro: Alert and oriented X 3. Moves all extremities spontaneously. Gait is normal. CNII-XII grossly in tact. Psych:  Responds to questions appropriately with a normal affect.     ASSESSMENT AND PLAN:  55 y.o. year old female with   AT OV 01/16/2016 SHE STATES THAT---------------- SHE WOULD LIKE TO SCHEDULE EVERY OTHER VISIT AS CPE----------------------------------------------------  1. Thyroid disease - TSH Continue current dose of thyroid. Check lab to monitor.  2. Smoker:  Again today, I discusse prescribing her medication to help her with cessation. Again today, she defers.  01/2014--Pneumovax 23 was given here. At office visit 01/14/15 discussed getting screening chest x-ray.  She says she wants to call her insurance regarding cost first and then will get back with me if she wants to have this performed. 07/17/2015---CONGRATULATIONS ON CESSATION!!! 01/16/2016---YAY!!!  3. she had a complete physical exam by me 07/22/12. At visit 01/16/16 she is agreeable to schedule next visit as a complete physical exam and come fasting. She reports that she actually would like to start scheduling every other visit as a physical.   Will  Up-Date following preventive care at her physical in 6 months.  Breast cancer screening/ mammogram: Last was May 2012.  At Her physical 08/2012 we discussed that she needed to follow this up.  At her OV with me 08/2013 She said every time she tried to call any [facility] she could never get anyone to answer her phone calls. At that visit, I placed order for mammogram.  At [place] 01/2014 patient reported that she canceled that appointment and never did schedule another mammogram. Discussed with her again at 01/2014 the need for annual mammograms.At that visit  she was informed of risks versus benefits of having the mammogram performed. At [place] 07/11/2014--patient says that she never did have followup mammogram. I discussed this again today and she is agreeable for me to schedule another mammogram. At [place] 01/14/15--she states she did have mammogram but this time has it at the breast center. At [place] 07/17/15 she states that she has a mammogram scheduled for October 14.   pelvic exam/Pap smear was done 07/22/12. Pap 07/2012: Cytology alone was performed. Did not do HPV cotesting. Cytology was negative. --Should have f/u Pap 3 years.--Due 07/2015 Discussed need for annual pelvic exam. She defers now.  Colonoscopy: this was done May 2012. Normal. Positive family history. Repeat 5 years. Due May 2017.  Immunizations: Tdap--  Was given 01/2011 Recommended influenza vaccine at [place] 08/2013 but she deferred. Recommended again at office visit 06/2014 but she defers. Recommended again at visit 07/17/15 but she again defers. Pneumonia Vaccine: 01/2014--discussed that given her smoking, she needed to get a Pneumovax -she was agreeable. Pneumovax 23 given 01/2014.Marland Kitchen    ---No further pneumonia vaccine until age 61.     lipid panel was excellent at lab 07/2012.      Triglyceride 101      HDL 45    LDL 99.     Can wait  3 - 5 years to repeat.  -----------Return in 6 months. Going to schedule this early morning says that she can  come fasting and is going to be scheduled as a complete physical.-------------------------------    Signed, Karis Juba, [place], [facility] 01/16/2016 9:32 AM

## 2016-01-20 ENCOUNTER — Other Ambulatory Visit: Payer: Self-pay | Admitting: Family Medicine

## 2016-01-20 MED ORDER — LEVOTHYROXINE SODIUM 112 MCG PO TABS: 112.0000 ug | ORAL_TABLET | Freq: Every day | ORAL | Status: DC

## 2016-03-03 ENCOUNTER — Other Ambulatory Visit: Payer: Self-pay | Admitting: Physician Assistant

## 2016-03-03 NOTE — Telephone Encounter (Signed)
Levothyroxine was just refilled in April for 90 + 1  This request denied

## 2016-06-11 ENCOUNTER — Telehealth: Payer: Self-pay

## 2016-06-11 NOTE — Telephone Encounter (Signed)
[facility] pharmacy sent fax stating manufacture  name changed for levothyroxine

## 2016-06-29 ENCOUNTER — Other Ambulatory Visit: Payer: Self-pay | Admitting: Physician Assistant

## 2016-06-29 DIAGNOSIS — Z1231 Encounter for screening mammogram for malignant neoplasm of breast: Secondary | ICD-10-CM

## 2016-07-22 ENCOUNTER — Encounter: Payer: Self-pay | Admitting: Physician Assistant

## 2016-07-22 ENCOUNTER — Ambulatory Visit (INDEPENDENT_AMBULATORY_CARE_PROVIDER_SITE_OTHER): Payer: 59 | Admitting: Physician Assistant

## 2016-07-22 VITALS — BP 126/84 | HR 66 | Temp 98.1°F | Resp 18 | Wt 186.0 lb

## 2016-07-22 DIAGNOSIS — Z0000 Encounter for general adult medical examination without abnormal findings: Secondary | ICD-10-CM | POA: Diagnosis not present

## 2016-07-22 DIAGNOSIS — E079 Disorder of thyroid, unspecified: Secondary | ICD-10-CM

## 2016-07-22 DIAGNOSIS — K219 Gastro-esophageal reflux disease without esophagitis: Secondary | ICD-10-CM

## 2016-07-22 DIAGNOSIS — Z23 Encounter for immunization: Secondary | ICD-10-CM

## 2016-07-22 LAB — COMPLETE METABOLIC PANEL WITH GFR
ALK PHOS: 63 U/L (ref 33–130)
ALT: 38 U/L — ABNORMAL HIGH (ref 6–29)
AST: 27 U/L (ref 10–35)
Albumin: 4.5 g/dL (ref 3.6–5.1)
BILIRUBIN TOTAL: 0.9 mg/dL (ref 0.2–1.2)
BUN: 11 mg/dL (ref 7–25)
CO2: 26 mmol/L (ref 20–31)
CREATININE: 0.82 mg/dL (ref 0.50–1.05)
Calcium: 9.6 mg/dL (ref 8.6–10.4)
Chloride: 105 mmol/L (ref 98–110)
GFR, Est African American: >89 mL/min (ref >=60)
GFR, Est Non African American: 81 mL/min (ref >=60)
GLUCOSE: 88 mg/dL (ref 70–99)
Potassium: 4.6 mmol/L (ref 3.5–5.3)
SODIUM: 142 mmol/L (ref 135–146)
Total Protein: 6.8 g/dL (ref 6.1–8.1)

## 2016-07-22 LAB — CBC WITH DIFFERENTIAL/PLATELET
BASOS ABS: 0 {cells}/uL (ref 0–200)
Basophils Relative: 0 %
EOS PCT: 2 %
Eosinophils Absolute: 166 cells/uL (ref 15–500)
HCT: 43.6 % (ref 35.0–45.0)
Hemoglobin: 14.6 g/dL (ref 12.0–15.0)
LYMPHS PCT: 26 %
Lymphs Abs: 2158 cells/uL (ref 850–3900)
MCH: 30.5 pg (ref 27.0–33.0)
MCHC: 33.5 g/dL (ref 32.0–36.0)
MCV: 91.2 fL (ref 80.0–100.0)
MONOS PCT: 7 %
MPV: 9.7 fL (ref 7.5–12.5)
Monocytes Absolute: 581 cells/uL (ref 200–950)
NEUTROS PCT: 65 %
Neutro Abs: 5395 cells/uL (ref 1500–7800)
PLATELETS: 259 10*3/uL (ref 140–400)
RBC: 4.78 MIL/uL (ref 3.80–5.10)
RDW: 12.7 % (ref 11.0–15.0)
WBC: 8.3 10*3/uL (ref 3.8–10.8)

## 2016-07-22 LAB — LIPID PANEL
Cholesterol: 145 mg/dL (ref 125–200)
HDL: 48 mg/dL (ref >=46)
LDL Cholesterol: 83 mg/dL (ref <130)
Total CHOL/HDL Ratio: 3 Ratio (ref <=5.0)
Triglycerides: 72 mg/dL (ref <150)
VLDL: 14 mg/dL (ref <30)

## 2016-07-22 LAB — TSH: TSH: 0.52 m[IU]/L

## 2016-07-22 NOTE — Progress Notes (Signed)
Patient ID: Tina Cruz MRN: [id number], DOB: [birth year], 55 y.o. Date of Encounter: 07/22/2016,   Chief Complaint: Physical (CPE)  HPI: 55 y.o. y/o female  here for CPE.   She is taking her thyroid medication daily.  She has not been smoking and has stayed off of cigarettes since her last visit.  She has no complaints or concerns today. Just here for her physical.   Review of Systems: Consitutional: No fever, chills, fatigue, night sweats, lymphadenopathy. No significant/unexplained weight changes. Eyes: No visual changes, eye redness, or discharge. ENT/Mouth: No ear pain, sore throat, nasal drainage, or sinus pain. Cardiovascular: No chest pressure,heaviness, tightness or squeezing, even with exertion. No increased shortness of breath or dyspnea on exertion.No palpitations, edema, orthopnea, PND. Respiratory: No cough, hemoptysis, SOB, or wheezing. Gastrointestinal: No anorexia, dysphagia, reflux, pain, nausea, vomiting, hematemesis, diarrhea, constipation, BRBPR, or melena. Breast: No mass, nodules, bulging, or retraction. No skin changes or inflammation. No nipple discharge. No lymphadenopathy. Genitourinary: No dysuria, hematuria, incontinence, vaginal discharge, pruritis, burning, abnormal bleeding, or pain. Musculoskeletal: No decreased ROM, No joint pain or swelling. No significant pain in neck, back, or extremities. Skin: No rash, pruritis, or concerning lesions. Neurological: No headache, dizziness, syncope, seizures, tremors, memory loss, coordination problems, or paresthesias. Psychological: No anxiety, depression, hallucinations, SI/HI. Endocrine: No polydipsia, polyphagia, polyuria, or known diabetes.No increased fatigue. No palpitations/rapid heart rate. No significant/unexplained weight change. All other systems were reviewed and are otherwise negative.  Past Medical History:  Diagnosis Date  . Active smoker   . Depression   . H/O chronic bronchitis   .  Thyroid disease    hypothyroid     Past Surgical History:  Procedure Laterality Date  . TONSILLECTOMY    . TUBAL LIGATION      Home Meds:  Outpatient Medications Prior to Visit  Medication Sig Dispense Refill  . Ibuprofen (ADVIL PO) Take 1 tablet by mouth as needed ('220mg'$  OTC).    Marland Kitchen levothyroxine (SYNTHROID, LEVOTHROID) 112 MCG tablet Take 1 tablet (112 mcg total) by mouth daily before breakfast. 90 tablet 1  . ranitidine (ZANTAC) 150 MG tablet Take 150 mg by mouth as needed for heartburn (not RX pt buys OTC).     No facility-administered medications prior to visit.     Allergies:  Allergies  Allergen Reactions  . Albuterol Hives and Itching    Social History   Social History  . Marital status: Married    Spouse name: N/A  . Number of children: N/A  . Years of education: N/A   Occupational History  . Not on file.   Social History Main Topics  . Smoking status: Former Smoker    Packs/day: 0.50    Types: Cigarettes    Quit date: 06/03/2015  . Smokeless tobacco: Never Used  . Alcohol use No  . Drug use: No  . Sexual activity: Not on file   Other Topics Concern  . Not on file   Social History Narrative  . No narrative on file    Family History  Problem Relation Age of Onset  . Cancer Mother     colon  . Alcohol abuse Father     Physical Exam: Blood pressure 126/84, pulse 66, temperature 98.1 F (36.7 C), temperature source Oral, resp. rate 18, weight 186 lb (84.4 kg), SpO2 97 %., Body mass index is 30.02 kg/m. General: Well developed, well nourished WF. Appears in no acute distress. HEENT: Normocephalic, atraumatic. Conjunctiva pink, sclera non-icteric. Pupils 2 mm  constricting to 1 mm, round, regular, and equally reactive to light and accomodation. EOMI. Internal auditory canal clear. TMs with good cone of light and without pathology. Nasal mucosa pink. Nares are without discharge. No sinus tenderness. Oral mucosa pink.  Pharynx without exudate.   Neck:  Supple. Trachea midline. No thyromegaly. Full ROM. No lymphadenopathy.No Carotid Bruits. Lungs: Clear to auscultation bilaterally without wheezes, rales, or rhonchi. Breathing is of normal effort and unlabored. Cardiovascular: RRR with S1 S2. No murmurs, rubs, or gallops. Distal pulses 2+ symmetrically. No carotid or abdominal bruits. Breast: Symmetrical. No masses. Nipples without discharge. Abdomen: Soft, non-tender, non-distended with normoactive bowel sounds. No hepatosplenomegaly or masses. No rebound/guarding. No CVA tenderness. No hernias.  Genitourinary:  External genitalia without lesions. Vaginal mucosa pink.No discharge present. Cervix pink and without discharge. No cervical tenderness.Normal uterus size. No adnexal mass or tenderness.  Pap smear taken. Musculoskeletal: Full range of motion and 5/5 strength throughout.  Skin: Warm and moist without erythema, ecchymosis, wounds, or rash. Neuro: A+Ox3. CN II-XII grossly intact. Moves all extremities spontaneously. Full sensation throughout. Normal gait. DTR 2+ throughout upper and lower extremities.  Psych:  Responds to questions appropriately with a normal affect.   Assessment/Plan:  55 y.o. y/o female here for CPE  1. Encounter for preventive health examination  A. Screening Labs: - CBC with Differential/Platelet - COMPLETE METABOLIC PANEL WITH GFR - Lipid panel - TSH - VITAMIN D 25 Hydroxy (Vit-D Deficiency, Fractures)  B. Pap: Pap---07/2012----cytology alone was performed. Did not do HPV co-testing. Cytology was negative. Therefore follow-up Pap smear due 3 years. Due today. Will check co-testing. - PAP, Thin Prep w/HPV rflx HPV Type 16/18   C. Screening Mammogram: Last mammogram 07/26/2015. Has a mammogram scheduled for 07/27/2016.  D. DEXA/BMD:  Will wait to start this at around age 89.  E. Colorectal Cancer Screening: She had colonoscopy May 2012. Normal. Positive family history. Repeat 5 years. Follow up  colonoscopy was due May 2017. Discussed this at her visit 07/2016. She refuses to have a follow-up colonoscopy. She is aware of the risk versus benefit and is aware of her family history increasing her risk for colorectal cancer. Refuses colonoscopy. She is agreeable to do ColoGuard so I have had her talk to Maudie Mercury to get ColoGuard today.  F. Immunizations:  Influenza:----------- she is agreeable to get flu shot today. Given here 07/22/2016 Tetanus: She received T dap 01/27/2011 Pneumococcal: She received pneumonia vaccine--- 02/07/14.(was a smoker)----no further pneumonia vaccine indicated until age 37. Zostavax: Will discuss this at age 46.   2. Gastroesophageal reflux disease, esophagitis presence not specified Controlled with current medication. Only uses PRN.  3. Thyroid disease She is on thyroid supplement. Check lab to monitor. - TSH   Routine office visit 6 months or sooner if needed.   [address] [place], [place], [facility] 07/22/2016 1:38 PM

## 2016-07-23 LAB — VITAMIN D 25 HYDROXY (VIT D DEFICIENCY, FRACTURES): VIT D 25 HYDROXY: 28 ng/mL — AB (ref 30–100)

## 2016-07-24 LAB — PAP, THIN PREP W/HPV RFLX HPV TYPE 16/18: HPV DNA High Risk: NOT DETECTED

## 2016-07-27 ENCOUNTER — Ambulatory Visit
Admission: RE | Admit: 2016-07-27 | Discharge: 2016-07-27 | Disposition: A | Discharge status: Home / Self Care | Payer: 59 | Source: Ambulatory Visit | Attending: Physician Assistant | Admitting: Physician Assistant

## 2016-07-27 DIAGNOSIS — Z1231 Encounter for screening mammogram for malignant neoplasm of breast: Secondary | ICD-10-CM

## 2016-08-03 LAB — COLOGUARD: COLOGUARD: NEGATIVE

## 2016-08-27 DIAGNOSIS — Z23 Encounter for immunization: Secondary | ICD-10-CM | POA: Diagnosis not present

## 2016-08-27 NOTE — Addendum Note (Signed)
Addended by: Olena Mater on: 08/27/2016 03:07 PM   Modules accepted: Orders

## 2016-08-30 ENCOUNTER — Other Ambulatory Visit: Payer: Self-pay | Admitting: Physician Assistant

## 2016-08-31 NOTE — Telephone Encounter (Signed)
Rx refilled per protocol 

## 2017-01-20 ENCOUNTER — Ambulatory Visit (INDEPENDENT_AMBULATORY_CARE_PROVIDER_SITE_OTHER): Payer: 59 | Admitting: Physician Assistant

## 2017-01-20 ENCOUNTER — Encounter: Payer: Self-pay | Admitting: Physician Assistant

## 2017-01-20 VITALS — BP 118/70 | HR 75 | Temp 98.5°F | Resp 16 | Wt 188.6 lb

## 2017-01-20 DIAGNOSIS — K219 Gastro-esophageal reflux disease without esophagitis: Secondary | ICD-10-CM

## 2017-01-20 DIAGNOSIS — E079 Disorder of thyroid, unspecified: Secondary | ICD-10-CM

## 2017-01-20 DIAGNOSIS — F17200 Nicotine dependence, unspecified, uncomplicated: Secondary | ICD-10-CM | POA: Diagnosis not present

## 2017-01-20 DIAGNOSIS — Z1283 Encounter for screening for malignant neoplasm of skin: Secondary | ICD-10-CM | POA: Diagnosis not present

## 2017-01-20 LAB — TSH: TSH: 0.53 mIU/L

## 2017-01-20 NOTE — Progress Notes (Signed)
Patient ID: Tina Cruz MRN: [id number], DOB: August 08, 1961, 56 y.o. Date of Encounter: 01/20/2017, 10:30 AM    Chief Complaint:  Chief Complaint  Patient presents with  . thyroid f/u     HPI: 56 y.o. year old white female here for routine follow up of hypothyroidism.  She is taking her thyroid supplement at 112 mcg daily. She has had no significant changes in her weight. No significant change in energy level. No changes in hair.  She reports that she has stayed off the cigarettes.  She states she does not take Zantac every single day but does take it most days--- goes ahead and takes it if she knows she is going to eat something that causes heartburn--anything with red sauce etc  She has recently noticed new skin lesion on face--right cheek.  No other concerns to address.   She comes for CPE annually. Comes for OV to monitor thyroid Q 6 months--between CPEs. Every other visit CPE / OV  Home Meds:   Outpatient Medications Prior to Visit  Medication Sig Dispense Refill  . Ibuprofen (ADVIL PO) Take 1 tablet by mouth as needed ('220mg'$  OTC).    Marland Kitchen levothyroxine (SYNTHROID, LEVOTHROID) 112 MCG tablet TAKE 1 TABLET DAILY BEFORE BREAKFAST 90 tablet 1  . ranitidine (ZANTAC) 150 MG tablet Take 150 mg by mouth as needed for heartburn (not RX pt buys OTC).     No facility-administered medications prior to visit.      Allergies:  Allergies  Allergen Reactions  . Albuterol Hives and Itching      Review of Systems: See HPI for pertinent ROS. All other ROS negative.    Physical Exam: Blood pressure 118/70, pulse 75, temperature 98.5 F (36.9 C), temperature source Oral, resp. rate 16, weight 188 lb 9.6 oz (85.5 kg), SpO2 98 %., Body mass index is 30.44 kg/m. General: WNWD WF. Appears in no acute distress. Neck: Supple. No thyromegaly. No lymphadenopathy. No carotid bruits. Lungs: Clear bilaterally to auscultation without wheezes, rales, or rhonchi. Breathing is unlabored. Heart:  Regular rhythm. No murmurs, rubs, or gallops. Msk:  Strength and tone normal for age. Extremities/Skin: Warm and dry.  No edema.  Face: Left cheek 0.5 cm diameter slightly raised papule--same color as other skin--- She has lots of skin lesions scattered over all of he body Neuro: Alert and oriented X 3. Moves all extremities spontaneously. Gait is normal. CNII-XII grossly in tact. Psych:  Responds to questions appropriately with a normal affect.     ASSESSMENT AND PLAN:  56 y.o. year old female with   ---------------- SHE SCHEDULES EVERY OTHER VISIT AS CPE----------------------------------------------------  1. Thyroid disease - TSH Continue current dose of thyroid. Check lab to monitor.  2. Past Smoker:  She has quit and stayed quit > 1 year  3. Gastroesophageal reflux disease, esophagitis presence not specified She states she does not take Zantac every single day but does take it most days--- goes ahead and takes it if she knows she is going to eat something that causes heartburn--anything with red sauce etc Stable, controlled  4. Skin cancer screening She has lots of skin lesions. Currnet lesion she has noticed is on her face. Has never seen Dermatologist. Rec she see Dermatology for Screen--She agrees.  - Ambulatory referral to Dermatology   complete physical exams She schedules every other visit as a physical.   Will Up-Date following preventive care at her physical in 6 months.   -----------Return in 6 months. Going to schedule  this early morning says that she can come fasting and is going to be scheduled as a complete physical.-------------------------------    Signed, Karis Juba, [place], BSFM 01/20/2017 10:30 AM

## 2017-01-21 ENCOUNTER — Other Ambulatory Visit: Payer: Self-pay

## 2017-01-21 MED ORDER — LEVOTHYROXINE SODIUM 112 MCG PO TABS: 112.0000 ug | ORAL_TABLET | Freq: Every day | ORAL | 1 refills | Status: DC

## 2017-01-29 ENCOUNTER — Telehealth: Payer: Self-pay

## 2017-01-29 NOTE — Telephone Encounter (Signed)
Called patient to provide Cologuard results

## 2017-03-11 DIAGNOSIS — L918 Other hypertrophic disorders of the skin: Secondary | ICD-10-CM | POA: Insufficient documentation

## 2017-03-11 DIAGNOSIS — L821 Other seborrheic keratosis: Secondary | ICD-10-CM | POA: Insufficient documentation

## 2017-03-11 DIAGNOSIS — D1800 Hemangioma unspecified site: Secondary | ICD-10-CM | POA: Insufficient documentation

## 2017-05-24 ENCOUNTER — Ambulatory Visit (INDEPENDENT_AMBULATORY_CARE_PROVIDER_SITE_OTHER): Payer: 59 | Admitting: Physician Assistant

## 2017-05-24 VITALS — BP 118/80 | HR 80 | Temp 97.7°F

## 2017-05-24 DIAGNOSIS — M5440 Lumbago with sciatica, unspecified side: Secondary | ICD-10-CM | POA: Diagnosis not present

## 2017-05-24 LAB — URINALYSIS, ROUTINE W REFLEX MICROSCOPIC
BILIRUBIN URINE: NEGATIVE
Glucose, UA: NEGATIVE
Hgb urine dipstick: NEGATIVE
KETONES UR: NEGATIVE
Leukocytes, UA: NEGATIVE
Nitrite: NEGATIVE
PH: 7 (ref 5.0–8.0)
Protein, ur: NEGATIVE
SPECIFIC GRAVITY, URINE: 1.01 (ref 1.001–1.035)

## 2017-05-24 MED ORDER — CYCLOBENZAPRINE HCL 10 MG PO TABS: 10.0000 mg | ORAL_TABLET | Freq: Three times a day (TID) | ORAL | 0 refills | Status: DC | PRN

## 2017-05-24 NOTE — Progress Notes (Signed)
Patient ID: EUGENE ZEIDERS MRN: [id number], DOB: Oct 24, 1960, 56 y.o. Date of Encounter: 05/24/2017, 3:04 PM    Chief Complaint:  Chief Complaint  Patient presents with  . right side pain    x63month      HPI: 56 y.o. year old female presents with above.   She states that there has been an area in her right low back that has been hurting off and on for about a month. She states that when she is up moving around it really doesn't bother her but if she sits still that's when she notices it bothering her and it feels like some achy discomfort. Describes it as an achy discomfort. Says it is not sharp or stabbing. Says that some days she has to use multiple ibuprofen but then other days "can deal with it "without having to use any medicine. States that the pain has not interfered with sleep. She was concerned that it may have to do with her urine/kidneys and wanted to check that.  She points to focal area on right low back at about L1 level is area of pain and it is reproduced with palpation today during visit.     Home Meds:   Outpatient Medications Prior to Visit  Medication Sig Dispense Refill  . Ibuprofen (ADVIL PO) Take 1 tablet by mouth as needed (220mg  OTC).    Marland Kitchen levothyroxine (SYNTHROID, LEVOTHROID) 112 MCG tablet Take 1 tablet (112 mcg total) by mouth daily before breakfast. 90 tablet 1  . ranitidine (ZANTAC) 150 MG tablet Take 150 mg by mouth as needed for heartburn (not RX pt buys OTC).     No facility-administered medications prior to visit.     Allergies:  Allergies  Allergen Reactions  . Albuterol Hives and Itching      Review of Systems: See HPI for pertinent ROS. All other ROS negative.    Physical Exam: Blood pressure 118/80, pulse 80, temperature 97.7 F (36.5 C), temperature source Oral, SpO2 98 %., There is no height or weight on file to calculate BMI. General:  WNWD WF. Appears in no acute distress. Neck: Supple. No thyromegaly. No  lymphadenopathy. Lungs: Clear bilaterally to auscultation without wheezes, rales, or rhonchi. Breathing is unlabored. Heart: Regular rhythm. No murmurs, rubs, or gallops. Abdomen: Soft, non-tender, non-distended with normoactive bowel sounds. No hepatomegaly. No rebound/guarding. No obvious abdominal masses. Msk:  Strength and tone normal for age. Right Back-- ~ L1 Level--focla area of tenderness--reproduced with palpation.  Extremities/Skin: Warm and dry.  Neuro: Alert and oriented X 3. Moves all extremities spontaneously. Gait is normal. CNII-XII grossly in tact. Psych:  Responds to questions appropriately with a normal affect.   Results for orders placed or performed in visit on 05/24/17  Urinalysis, Routine w reflex microscopic  Result Value Ref Range   Color, Urine YELLOW YELLOW   APPearance CLEAR CLEAR   Specific Gravity, Urine 1.010 1.001 - 1.035   pH 7.0 5.0 - 8.0   Glucose, UA NEGATIVE NEGATIVE   Bilirubin Urine NEGATIVE NEGATIVE   Ketones, ur NEGATIVE NEGATIVE   Hgb urine dipstick NEGATIVE NEGATIVE   Protein, ur NEGATIVE NEGATIVE   Nitrite NEGATIVE NEGATIVE   Leukocytes, UA NEGATIVE NEGATIVE    ASSESSMENT AND PLAN:  56 y.o. year old female with  1. Acute right-sided low back pain with sciatica, sciatica laterality unspecified Re -assured her that urinalysis is normal. Discussed that her pain is musculoskeletal. Had her do a specific stretch during her visit that I  recommend she do on a regular basis. Told her to gently stretch. Also recommend that she apply heat using heating pad to the area. Discussed that the Flexeril can cause drowsiness and if it causes drowsiness for her than make sure not to take it prior to driving but can at least use it at night. - Urinalysis, Routine w reflex microscopic - cyclobenzaprine (FLEXERIL) 10 MG tablet; Take 1 tablet (10 mg total) by mouth 3 (three) times daily as needed for muscle spasms.  Dispense: 30 tablet; Refill: 0   Signed, [address] [place], [place], [facility] 05/24/2017 3:04 PM

## 2017-06-16 ENCOUNTER — Other Ambulatory Visit: Payer: Self-pay | Admitting: Physician Assistant

## 2017-06-16 DIAGNOSIS — Z1231 Encounter for screening mammogram for malignant neoplasm of breast: Secondary | ICD-10-CM

## 2017-07-22 ENCOUNTER — Ambulatory Visit (INDEPENDENT_AMBULATORY_CARE_PROVIDER_SITE_OTHER): Payer: 59 | Admitting: Physician Assistant

## 2017-07-22 ENCOUNTER — Encounter: Payer: Self-pay | Admitting: Physician Assistant

## 2017-07-22 VITALS — BP 106/54 | HR 62 | Temp 98.0°F | Resp 16 | Ht 66.0 in | Wt 191.0 lb

## 2017-07-22 DIAGNOSIS — Z1159 Encounter for screening for other viral diseases: Secondary | ICD-10-CM

## 2017-07-22 DIAGNOSIS — Z0000 Encounter for general adult medical examination without abnormal findings: Secondary | ICD-10-CM

## 2017-07-22 DIAGNOSIS — K219 Gastro-esophageal reflux disease without esophagitis: Secondary | ICD-10-CM

## 2017-07-22 DIAGNOSIS — Z23 Encounter for immunization: Secondary | ICD-10-CM

## 2017-07-22 DIAGNOSIS — Z114 Encounter for screening for human immunodeficiency virus [HIV]: Secondary | ICD-10-CM

## 2017-07-22 DIAGNOSIS — E079 Disorder of thyroid, unspecified: Secondary | ICD-10-CM | POA: Diagnosis not present

## 2017-07-22 NOTE — Progress Notes (Signed)
Patient ID: KENADEE GATES MRN: [id number], DOB: 06-10-1961, 56 y.o. Date of Encounter: 07/22/2017,   Chief Complaint: Physical (CPE)  HPI: 56 y.o. y/o female  here for CPE.    She has no complaints or concerns today. Just here for her physical. She is taking her thyroid medication daily.  She reports that there've been no updates since last year. No updates in her medical conditions. No new family history of any medical issues.   Review of Systems: Consitutional: No fever, chills, fatigue, night sweats, lymphadenopathy. No significant/unexplained weight changes. Eyes: No visual changes, eye redness, or discharge. ENT/Mouth: No ear pain, sore throat, nasal drainage, or sinus pain. Cardiovascular: No chest pressure,heaviness, tightness or squeezing, even with exertion. No increased shortness of breath or dyspnea on exertion.No palpitations, edema, orthopnea, PND. Respiratory: No cough, hemoptysis, SOB, or wheezing. Gastrointestinal: No anorexia, dysphagia, reflux, pain, nausea, vomiting, hematemesis, diarrhea, constipation, BRBPR, or melena. Breast: No mass, nodules, bulging, or retraction. No skin changes or inflammation. No nipple discharge. No lymphadenopathy. Genitourinary: No dysuria, hematuria, incontinence, vaginal discharge, pruritis, burning, abnormal bleeding, or pain. Musculoskeletal: No decreased ROM, No joint pain or swelling. No significant pain in neck, back, or extremities. Skin: No rash, pruritis, or concerning lesions. Neurological: No headache, dizziness, syncope, seizures, tremors, memory loss, coordination problems, or paresthesias. Psychological: No anxiety, depression, hallucinations, SI/HI. Endocrine: No polydipsia, polyphagia, polyuria, or known diabetes.No increased fatigue. No palpitations/rapid heart rate. No significant/unexplained weight change. All other systems were reviewed and are otherwise negative.  Past Medical History:  Diagnosis Date  .  Active smoker   . Depression   . H/O chronic bronchitis   . Thyroid disease    hypothyroid     Past Surgical History:  Procedure Laterality Date  . TONSILLECTOMY    . TUBAL LIGATION      Home Meds:  Outpatient Medications Prior to Visit  Medication Sig Dispense Refill  . cyclobenzaprine (FLEXERIL) 10 MG tablet Take 1 tablet (10 mg total) by mouth 3 (three) times daily as needed for muscle spasms. 30 tablet 0  . Ibuprofen (ADVIL PO) Take 1 tablet by mouth as needed (220mg  OTC).    Marland Kitchen levothyroxine (SYNTHROID, LEVOTHROID) 112 MCG tablet Take 1 tablet (112 mcg total) by mouth daily before breakfast. 90 tablet 1  . ranitidine (ZANTAC) 150 MG tablet Take 150 mg by mouth as needed for heartburn (not RX pt buys OTC).     No facility-administered medications prior to visit.     Allergies:  Allergies  Allergen Reactions  . Albuterol Hives and Itching    Social History   Social History  . Marital status: Married    Spouse name: N/A  . Number of children: N/A  . Years of education: N/A   Occupational History  . Not on file.   Social History Main Topics  . Smoking status: Former Smoker    Packs/day: 0.50    Types: Cigarettes    Quit date: 06/03/2015  . Smokeless tobacco: Never Used  . Alcohol use No  . Drug use: No  . Sexual activity: Not on file   Other Topics Concern  . Not on file   Social History Narrative  . No narrative on file    Family History  Problem Relation Age of Onset  . Cancer Mother        colon  . Alcohol abuse Father     Physical Exam: Blood pressure (!) 106/54, pulse 62, temperature 98 F (36.7 C), temperature  source Oral, resp. rate 16, height 5\' 6"  (1.676 m), weight 86.6 kg (191 lb), SpO2 97 %., Body mass index is 30.83 kg/m. General: Well developed, well nourished WF. Appears in no acute distress. HEENT: Normocephalic, atraumatic. Conjunctiva pink, sclera non-icteric. Pupils 2 mm constricting to 1 mm, round, regular, and equally reactive  to light and accomodation. EOMI. Internal auditory canal clear. TMs with good cone of light and without pathology. Nasal mucosa pink. Nares are without discharge. No sinus tenderness. Oral mucosa pink.  Pharynx without exudate.   Neck: Supple. Trachea midline. No thyromegaly. Full ROM. No lymphadenopathy.No Carotid Bruits. Lungs: Clear to auscultation bilaterally without wheezes, rales, or rhonchi. Breathing is of normal effort and unlabored. Cardiovascular: RRR with S1 S2. No murmurs, rubs, or gallops. Distal pulses 2+ symmetrically. No carotid or abdominal bruits. Breast: Symmetrical. No masses. Nipples without discharge. Abdomen: Soft, non-tender, non-distended with normoactive bowel sounds. No hepatosplenomegaly or masses. No rebound/guarding. No CVA tenderness. No hernias.  Genitourinary:  External genitalia without lesions. Vaginal mucosa pink.No discharge present. Cervix pink and without discharge. No cervical tenderness.Normal uterus size. No adnexal mass or tenderness.  Musculoskeletal: Full range of motion and 5/5 strength throughout.  Skin: Warm and moist without erythema, ecchymosis, wounds, or rash. Neuro: A+Ox3. CN II-XII grossly intact. Moves all extremities spontaneously. Full sensation throughout. Normal gait. DTR 2+ throughout upper and lower extremities.  Psych:  Responds to questions appropriately with a normal affect.   Assessment/Plan:  56 y.o. y/o female here for CPE  1. Encounter for preventive health examination  A. Screening Labs: - CBC with Differential/Platelet - COMPLETE METABOLIC PANEL WITH GFR - Lipid panel - TSH - VITAMIN D 25 Hydroxy (Vit-D Deficiency, Fractures)  B. Pap: Pap---07/22/2016--- HPV - negative / Cytology - negative------- wait 3-5 years to repeat pap  C. Screening Mammogram: Last mammogram 07/27/2016. Has a mammogram scheduled for 07/28/2017.  D. DEXA/BMD:  Will wait to start this at around age 25.  E. Colorectal Cancer Screening: She  had colonoscopy May 2012. Normal. Positive family history. Repeat 5 years. Follow up colonoscopy was due May 2017. Discussed this at her visit 07/2016. 07/22/2016: She refuses to have a follow-up colonoscopy. She is aware of the risk versus benefit and is aware of her family history increasing her risk for colorectal cancer. Refuses                              colonoscopy. She is agreeable to doColoGuard so I have had her talk to Maudie Mercury to get ColoGuard today. 07/22/2017: She did Cologuard 08/03/2016--- negative  F. Immunizations:  Influenza:----------- she is agreeable to get flu shot today. Given here 07/22/2017 Tetanus: She received T dap 01/27/2011 Pneumococcal: She received pneumonia vaccine--- 02/07/14.(was a smoker)----no further pneumonia vaccine indicated until age 50. Shingrix: Discussed this at CPE 07/22/2017. She is to check with her insurance regarding coverage/cost and then follow-up with [place].   2. Gastroesophageal reflux disease, esophagitis presence not specified Controlled with current medication. Only uses PRN.  3. Thyroid disease She is on thyroid supplement. Check lab to monitor. - TSH   Routine office visit 6 months or sooner if needed.   Signed, [address] [place], [place], [facility] 07/22/2017 10:52 AM

## 2017-07-23 LAB — COMPLETE METABOLIC PANEL WITH GFR
AG Ratio: 1.9 (calc) (ref 1.0–2.5)
ALBUMIN MSPROF: 4.4 g/dL (ref 3.6–5.1)
ALKALINE PHOSPHATASE (APISO): 61 U/L (ref 33–130)
ALT: 36 U/L — ABNORMAL HIGH (ref 6–29)
AST: 23 U/L (ref 10–35)
BUN: 10 mg/dL (ref 7–25)
CALCIUM: 9.5 mg/dL (ref 8.6–10.4)
CO2: 25 mmol/L (ref 20–32)
CREATININE: 0.77 mg/dL (ref 0.50–1.05)
Chloride: 106 mmol/L (ref 98–110)
GFR, EST NON AFRICAN AMERICAN: 86 mL/min/{1.73_m2} (ref >=60)
GFR, Est African American: 100 mL/min/{1.73_m2} (ref >=60)
GLUCOSE: 103 mg/dL — AB (ref 65–99)
Globulin: 2.3 g/dL (calc) (ref 1.9–3.7)
Potassium: 4.4 mmol/L (ref 3.5–5.3)
Sodium: 142 mmol/L (ref 135–146)
Total Bilirubin: 1.1 mg/dL (ref 0.2–1.2)
Total Protein: 6.7 g/dL (ref 6.1–8.1)

## 2017-07-23 LAB — CBC WITH DIFFERENTIAL/PLATELET
BASOS ABS: 50 {cells}/uL (ref 0–200)
BASOS PCT: 0.7 %
EOS PCT: 2.1 %
Eosinophils Absolute: 151 cells/uL (ref 15–500)
HCT: 40.7 % (ref 35.0–45.0)
HEMOGLOBIN: 14.3 g/dL (ref 11.7–15.5)
LYMPHS ABS: 1807 {cells}/uL (ref 850–3900)
MCH: 31.2 pg (ref 27.0–33.0)
MCHC: 35.1 g/dL (ref 32.0–36.0)
MCV: 88.9 fL (ref 80.0–100.0)
MONOS PCT: 8.5 %
MPV: 10.3 fL (ref 7.5–12.5)
Neutro Abs: 4579 cells/uL (ref 1500–7800)
Neutrophils Relative %: 63.6 %
Platelets: 275 10*3/uL (ref 140–400)
RBC: 4.58 10*6/uL (ref 3.80–5.10)
RDW: 12.5 % (ref 11.0–15.0)
Total Lymphocyte: 25.1 %
WBC mixed population: 612 cells/uL (ref 200–950)
WBC: 7.2 10*3/uL (ref 3.8–10.8)

## 2017-07-23 LAB — LIPID PANEL
CHOL/HDL RATIO: 3 (calc) (ref <5.0)
CHOLESTEROL: 145 mg/dL (ref <200)
HDL: 48 mg/dL — AB (ref >50)
LDL CHOLESTEROL (CALC): 83 mg/dL
Non-HDL Cholesterol (Calc): 97 mg/dL (calc) (ref <130)
Triglycerides: 65 mg/dL (ref <150)

## 2017-07-23 LAB — TSH: TSH: 0.65 mIU/L (ref 0.40–4.50)

## 2017-07-23 LAB — HIV ANTIBODY (ROUTINE TESTING W REFLEX): HIV 1&2 Ab, 4th Generation: NONREACTIVE

## 2017-07-23 LAB — HEPATITIS C ANTIBODY
HEP C AB: NONREACTIVE
SIGNAL TO CUT-OFF: 0.01 (ref <1.00)

## 2017-07-28 ENCOUNTER — Ambulatory Visit
Admission: RE | Admit: 2017-07-28 | Discharge: 2017-07-28 | Disposition: A | Discharge status: Home / Self Care | Payer: 59 | Source: Ambulatory Visit | Attending: Physician Assistant | Admitting: Physician Assistant

## 2017-07-28 DIAGNOSIS — Z1231 Encounter for screening mammogram for malignant neoplasm of breast: Secondary | ICD-10-CM

## 2017-08-04 ENCOUNTER — Other Ambulatory Visit: Payer: Self-pay | Admitting: Physician Assistant

## 2018-01-20 ENCOUNTER — Other Ambulatory Visit: Payer: Self-pay

## 2018-01-20 ENCOUNTER — Encounter: Payer: Self-pay | Admitting: Physician Assistant

## 2018-01-20 ENCOUNTER — Ambulatory Visit: Payer: 59 | Admitting: Physician Assistant

## 2018-01-20 VITALS — BP 118/78 | HR 73 | Temp 98.4°F | Resp 16 | Ht 66.0 in | Wt 182.4 lb

## 2018-01-20 DIAGNOSIS — E079 Disorder of thyroid, unspecified: Secondary | ICD-10-CM

## 2018-01-20 DIAGNOSIS — K219 Gastro-esophageal reflux disease without esophagitis: Secondary | ICD-10-CM | POA: Diagnosis not present

## 2018-01-20 LAB — TSH: TSH: 1.67 mIU/L (ref 0.40–4.50)

## 2018-01-20 NOTE — Progress Notes (Addendum)
Patient ID: FRED HAMMES MRN: [id number], DOB: 01/14/1961, 57 y.o. Date of Encounter: 01/20/2018, 9:15 AM    Chief Complaint:  Chief Complaint  Patient presents with  . 6 month follow up     HPI: 57 y.o. year old white female here for routine follow up of hypothyroidism.  She is taking her thyroid supplement at 112 mcg daily. She has had no significant changes in her weight. No significant change in energy level. No changes in hair.  She continues to use the Zantac's only as needed.  She states she does not take Zantac every single day but does take it most days--- goes ahead and takes it if she knows she is going to eat something that causes heartburn--anything with red sauce etc  He states that everything has been stable and she has no specific concerns to address.   She comes for CPE annually. Comes for OV to monitor thyroid Q 6 months--between CPEs. Every other visit CPE / OV  Home Meds:   Outpatient Medications Prior to Visit  Medication Sig Dispense Refill  . cyclobenzaprine (FLEXERIL) 10 MG tablet Take 1 tablet (10 mg total) by mouth 3 (three) times daily as needed for muscle spasms. 30 tablet 0  . Ibuprofen (ADVIL PO) Take 1 tablet by mouth as needed (220mg  OTC).    Marland Kitchen levothyroxine (SYNTHROID, LEVOTHROID) 112 MCG tablet TAKE 1 TABLET (112 MCG TOTAL) BY MOUTH DAILY BEFORE BREAKFAST. 90 tablet 1  . ranitidine (ZANTAC) 150 MG tablet Take 150 mg by mouth as needed for heartburn (not RX pt buys OTC).     No facility-administered medications prior to visit.      Allergies:  Allergies  Allergen Reactions  . Albuterol Hives and Itching      Review of Systems: See HPI for pertinent ROS. All other ROS negative.    Physical Exam: Blood pressure 118/78, pulse 73, temperature 98.4 F (36.9 C), temperature source Oral, resp. rate 16, height 5\' 6"  (1.676 m), weight 82.7 kg (182 lb 6.4 oz), SpO2 97 %., Body mass index is 29.44 kg/m. General: WNWD WF. Appears in no acute  distress. Neck: Supple. No thyromegaly. No lymphadenopathy. No carotid bruits. Lungs: Clear bilaterally to auscultation without wheezes, rales, or rhonchi. Breathing is unlabored. Heart: Regular rhythm. No murmurs, rubs, or gallops. Msk:  Strength and tone normal for age. Extremities/Skin: Warm and dry.  No edema.  Neuro: Alert and oriented X 3. Moves all extremities spontaneously. Gait is normal. CNII-XII grossly in tact. Psych:  Responds to questions appropriately with a normal affect.     ASSESSMENT AND PLAN:  57 y.o. year old female with   ---------------- SHE SCHEDULES EVERY OTHER VISIT AS CPE----------------------------------------------------  1. Thyroid disease - TSH Continue current dose of thyroid. Check lab to monitor.  2. Past Smoker:  She has quit and stayed quit > 2 years  3. Gastroesophageal reflux disease, esophagitis presence not specified She states she does not take Zantac every single day but does take it most days--- goes ahead and takes it if she knows she is going to eat something that causes heartburn--anything with red sauce etc Stable, controlled   She schedules every other visit as a physical.   Will Up-Date following preventive care at her physical in 6 months.   -----------Return in 6 months. Going to schedule this early morning says that she can come fasting and is going to be scheduled as a complete physical.-------------------------------    Signed, Karis Juba, [place],  BSFM 01/20/2018 9:15 AM

## 2018-01-27 ENCOUNTER — Encounter: Payer: Self-pay | Admitting: *Deleted

## 2018-02-11 ENCOUNTER — Other Ambulatory Visit: Payer: Self-pay | Admitting: Physician Assistant

## 2018-06-06 ENCOUNTER — Other Ambulatory Visit: Payer: Self-pay | Admitting: Physician Assistant

## 2018-06-06 DIAGNOSIS — Z1231 Encounter for screening mammogram for malignant neoplasm of breast: Secondary | ICD-10-CM

## 2018-07-27 ENCOUNTER — Encounter: Payer: Self-pay | Admitting: Physician Assistant

## 2018-07-27 ENCOUNTER — Ambulatory Visit (INDEPENDENT_AMBULATORY_CARE_PROVIDER_SITE_OTHER): Payer: Managed Care, Other (non HMO) | Admitting: Physician Assistant

## 2018-07-27 VITALS — BP 122/84 | HR 89 | Temp 98.3°F | Resp 16 | Ht 67.25 in | Wt 192.0 lb

## 2018-07-27 DIAGNOSIS — J309 Allergic rhinitis, unspecified: Secondary | ICD-10-CM | POA: Insufficient documentation

## 2018-07-27 DIAGNOSIS — J301 Allergic rhinitis due to pollen: Secondary | ICD-10-CM | POA: Diagnosis not present

## 2018-07-27 DIAGNOSIS — Z23 Encounter for immunization: Secondary | ICD-10-CM

## 2018-07-27 DIAGNOSIS — E079 Disorder of thyroid, unspecified: Secondary | ICD-10-CM

## 2018-07-27 DIAGNOSIS — Z0000 Encounter for general adult medical examination without abnormal findings: Secondary | ICD-10-CM | POA: Diagnosis not present

## 2018-07-27 LAB — COMPLETE METABOLIC PANEL WITH GFR
AG Ratio: 1.9 (calc) (ref 1.0–2.5)
ALBUMIN MSPROF: 4.5 g/dL (ref 3.6–5.1)
ALT: 30 U/L — ABNORMAL HIGH (ref 6–29)
AST: 20 U/L (ref 10–35)
Alkaline phosphatase (APISO): 56 U/L (ref 33–130)
BILIRUBIN TOTAL: 1.3 mg/dL — AB (ref 0.2–1.2)
BUN: 12 mg/dL (ref 7–25)
CALCIUM: 9.6 mg/dL (ref 8.6–10.4)
CO2: 25 mmol/L (ref 20–32)
Chloride: 103 mmol/L (ref 98–110)
Creat: 0.87 mg/dL (ref 0.50–1.05)
GFR, EST AFRICAN AMERICAN: 86 mL/min/{1.73_m2} (ref >=60)
GFR, EST NON AFRICAN AMERICAN: 74 mL/min/{1.73_m2} (ref >=60)
GLUCOSE: 102 mg/dL — AB (ref 65–99)
Globulin: 2.4 g/dL (calc) (ref 1.9–3.7)
Potassium: 4.5 mmol/L (ref 3.5–5.3)
Sodium: 139 mmol/L (ref 135–146)
TOTAL PROTEIN: 6.9 g/dL (ref 6.1–8.1)

## 2018-07-27 LAB — CBC WITH DIFFERENTIAL/PLATELET
BASOS PCT: 0.5 %
Basophils Absolute: 43 cells/uL (ref 0–200)
Eosinophils Absolute: 179 cells/uL (ref 15–500)
Eosinophils Relative: 2.1 %
HCT: 42 % (ref 35.0–45.0)
HEMOGLOBIN: 14.6 g/dL (ref 11.7–15.5)
Lymphs Abs: 2185 cells/uL (ref 850–3900)
MCH: 31.3 pg (ref 27.0–33.0)
MCHC: 34.8 g/dL (ref 32.0–36.0)
MCV: 89.9 fL (ref 80.0–100.0)
MPV: 10.4 fL (ref 7.5–12.5)
Monocytes Relative: 8.2 %
NEUTROS ABS: 5398 {cells}/uL (ref 1500–7800)
Neutrophils Relative %: 63.5 %
Platelets: 276 10*3/uL (ref 140–400)
RBC: 4.67 10*6/uL (ref 3.80–5.10)
RDW: 12.7 % (ref 11.0–15.0)
TOTAL LYMPHOCYTE: 25.7 %
WBC: 8.5 10*3/uL (ref 3.8–10.8)
WBCMIX: 697 {cells}/uL (ref 200–950)

## 2018-07-27 LAB — LIPID PANEL
CHOL/HDL RATIO: 3.4 (calc) (ref <5.0)
CHOLESTEROL: 172 mg/dL (ref <200)
HDL: 50 mg/dL — AB (ref >50)
LDL Cholesterol (Calc): 103 mg/dL (calc) — ABNORMAL HIGH
NON-HDL CHOLESTEROL (CALC): 122 mg/dL (ref <130)
TRIGLYCERIDES: 99 mg/dL (ref <150)

## 2018-07-27 LAB — TSH: TSH: 2.97 mIU/L (ref 0.40–4.50)

## 2018-07-27 MED ORDER — CETIRIZINE HCL 10 MG PO CAPS: 1.0000 | ORAL_CAPSULE | Freq: Every day | ORAL | 11 refills | Status: AC | PRN

## 2018-07-27 NOTE — Progress Notes (Signed)
Patient ID: TABBY BEASTON MRN: [id number], DOB: Jul 26, 1961, 57 y.o. Date of Encounter: 07/27/2018,   Chief Complaint: Physical (CPE)  HPI: 57 y.o. y/o female  here for CPE.    She ishere for her physical. She reports that the only issue she has been having at all is-- that she has noticed a hacky cough over the past month.  Says that she can feel that there is some drainage in her throat.  Otherwise reports that things have been stable.  Has no other specific concerns to address today. She is taking her thyroid medication daily.  She reports that there've been no other updates since last year. No other updates in her medical conditions. No new family history of any medical issues.   Review of Systems: Consitutional: No fever, chills, fatigue, night sweats, lymphadenopathy. No significant/unexplained weight changes. Eyes: No visual changes, eye redness, or discharge. ENT/Mouth: No ear pain, sore throat,  or sinus pain. Cardiovascular: No chest pressure,heaviness, tightness or squeezing, even with exertion. No increased shortness of breath or dyspnea on exertion.No palpitations, edema, orthopnea, PND. Respiratory: No hemoptysis, SOB, or wheezing. Gastrointestinal: No anorexia, dysphagia, reflux, pain, nausea, vomiting, hematemesis, diarrhea, constipation, BRBPR, or melena. Breast: No mass, nodules, bulging, or retraction. No skin changes or inflammation. No nipple discharge. No lymphadenopathy. Genitourinary: No dysuria, hematuria, incontinence, vaginal discharge, pruritis, burning, abnormal bleeding, or pain. Musculoskeletal: No decreased ROM, No joint pain or swelling. No significant pain in neck, back, or extremities. Skin: No rash, pruritis, or concerning lesions. Neurological: No headache, dizziness, syncope, seizures, tremors, memory loss, coordination problems, or paresthesias. Psychological: No anxiety, depression, hallucinations, SI/HI. Endocrine: No polydipsia, polyphagia,  polyuria, or known diabetes.No increased fatigue. No palpitations/rapid heart rate. No significant/unexplained weight change. All other systems were reviewed and are otherwise negative.  Past Medical History:  Diagnosis Date  . Active smoker   . Depression   . H/O chronic bronchitis   . Thyroid disease    hypothyroid     Past Surgical History:  Procedure Laterality Date  . TONSILLECTOMY    . TUBAL LIGATION      Home Meds:  Outpatient Medications Prior to Visit  Medication Sig Dispense Refill  . cyclobenzaprine (FLEXERIL) 10 MG tablet Take 1 tablet (10 mg total) by mouth 3 (three) times daily as needed for muscle spasms. 30 tablet 0  . Ibuprofen (ADVIL PO) Take 1 tablet by mouth as needed (220mg  OTC).    Marland Kitchen levothyroxine (SYNTHROID, LEVOTHROID) 112 MCG tablet TAKE 1 TABLET (112 MCG TOTAL) BY MOUTH DAILY BEFORE BREAKFAST. 90 tablet 1  . ranitidine (ZANTAC) 150 MG tablet Take 150 mg by mouth as needed for heartburn (not RX pt buys OTC).     No facility-administered medications prior to visit.     Allergies:  Allergies  Allergen Reactions  . Albuterol Hives and Itching    Social History   Socioeconomic History  . Marital status: Married    Spouse name: Not on file  . Number of children: Not on file  . Years of education: Not on file  . Highest education level: Not on file  Occupational History  . Not on file  Social Needs  . Financial resource strain: Not on file  . Food insecurity:    Worry: Not on file    Inability: Not on file  . Transportation needs:    Medical: Not on file    Non-medical: Not on file  Tobacco Use  . Smoking status: Former Smoker  Packs/day: 0.50    Types: Cigarettes    Last attempt to quit: 06/03/2015    Years since quitting: 3.1  . Smokeless tobacco: Never Used  Substance and Sexual Activity  . Alcohol use: No    Alcohol/week: 0.0 standard drinks  . Drug use: No  . Sexual activity: Not on file  Lifestyle  . Physical activity:     Days per week: Not on file    Minutes per session: Not on file  . Stress: Not on file  Relationships  . Social connections:    Talks on phone: Not on file    Gets together: Not on file    Attends religious service: Not on file    Active member of club or organization: Not on file    Attends meetings of clubs or organizations: Not on file    Relationship status: Not on file  . Intimate partner violence:    Fear of current or ex partner: Not on file    Emotionally abused: Not on file    Physically abused: Not on file    Forced sexual activity: Not on file  Other Topics Concern  . Not on file  Social History Narrative  . Not on file    Family History  Problem Relation Age of Onset  . Cancer Mother        colon  . Alcohol abuse Father     Physical Exam: Blood pressure 122/84, pulse 89, temperature 98.3 F (36.8 C), temperature source Oral, resp. rate 16, height 5' 7.25" (1.708 m), weight 87.1 kg, SpO2 96 %., Body mass index is 29.85 kg/m. General: WNWD WF. Appears in no acute distress. Head: Normocephalic, atraumatic, eyes without discharge, sclera non-icteric, nares are without discharge. Bilateral auditory canals clear, TM's are without perforation, pearly grey and translucent with reflective cone of light bilaterally. Oral cavity moist, posterior pharynx without exudate, erythema.  Neck: Supple. No thyromegaly. No lymphadenopathy. No carotid bruits. Lungs: Clear bilaterally to auscultation without wheezes, rales, or rhonchi. Breathing is unlabored. Heart: RRR with S1 S2. No murmurs, rubs, or gallops. Breast Exam: Inspection is normal.  Symmetrical.  No skin changes.  Palpation is normal.  No masses.  No nipple discharge. Pelvic Exam: Sternal genitalia normal.  Vaginal mucosa normal.  Cervix normal.  Bimanual exam normal with no adnexal mass. Abdomen: Soft, non-tender, non-distended with normoactive bowel sounds. No hepatomegaly. No rebound/guarding. No obvious abdominal  masses. Musculoskeletal:  Strength and tone normal for age. Extremities/Skin: Warm and dry. No clubbing or cyanosis. No edema. No rashes or suspicious lesions. Neuro: Alert and oriented X 3. Moves all extremities spontaneously. Gait is normal. CNII-XII grossly in tact. Psych:  Responds to questions appropriately with a normal affect.    Assessment/Plan:  57 y.o. y/o female here for CPE  1. Encounter for preventive health examination  A. Screening Labs: - CBC with Differential/Platelet - COMPLETE METABOLIC PANEL WITH GFR - Lipid panel - TSH  B. Pap: Pap---07/22/2016--- HPV - negative / Cytology - negative------- wait 3-5 years to repeat pap  C. Screening Mammogram: Last mammogram 07/28/2017. Has a mammogram scheduled for 07/29/2018.  D. DEXA/BMD:  Will wait to start this at around age 89.  E. Colorectal Cancer Screening: She had colonoscopy May 2012. Normal. Positive family history. Repeat 5 years. Follow up colonoscopy was due May 2017. Discussed this at her visit 07/2016. 07/22/2016: She refuses to have a follow-up colonoscopy. She is aware of the risk versus benefit and is aware of her family  history increasing her risk for colorectal cancer. Refuses                              colonoscopy. She is agreeable to doColoGuard so I have had her talk to Maudie Mercury to get ColoGuard today. 07/22/2017: She did Cologuard 08/03/2016--- negative 07/27/2018: She did Cologuard 08/03/2016.  That was negative.  Will be due for screening again in 3 years which would be 08/04/2019.  Will re-discuss colorectal cancer screening at that time.  F. Immunizations:  Influenza:----------- she is agreeable to get flu shot today. Given here 07/27/2018 Tetanus: She received T dap 01/27/2011 Pneumococcal: She received pneumonia vaccine--- 02/07/14.(was a smoker)----no further pneumonia vaccine indicated until age 41. Shingrix: Discussed this at CPE 07/27/2018. She is to check with her insurance regarding  coverage/cost and then if cost is ok with her, she will receive at pharmacy.   2. Gastroesophageal reflux disease, esophagitis presence not specified Controlled with current medication. Only uses PRN.  3. Thyroid disease She is on thyroid supplement. Check lab to monitor. - TSH  4. Seasonal allergic rhinitis due to pollen Discussed that her symptoms sound consistent with some seasonal allergies.  She can use Zyrtec once a day for 1 to 2weeks during spring and fall at time of seasonal allergies.  Follow-up if symptoms do not resolve with this. - Cetirizine HCl (ZYRTEC ALLERGY) 10 MG CAPS; Take 1 capsule (10 mg total) by mouth daily as needed (allergies).  Dispense: 30 capsule; Refill: 11   Routine office visit 6 months or sooner if needed.   Signed, [address] [place], [place], [facility] 07/27/2018 10:52 AM

## 2018-07-29 ENCOUNTER — Ambulatory Visit
Admission: RE | Admit: 2018-07-29 | Discharge: 2018-07-29 | Disposition: A | Discharge status: Home / Self Care | Payer: Managed Care, Other (non HMO) | Source: Ambulatory Visit | Attending: Physician Assistant | Admitting: Physician Assistant

## 2018-07-29 DIAGNOSIS — Z1231 Encounter for screening mammogram for malignant neoplasm of breast: Secondary | ICD-10-CM

## 2018-07-29 IMAGING — MG DIGITAL SCREENING BILATERAL MAMMOGRAM WITH TOMO AND CAD
8 series · 8 of 24 positions shown · non-contrast
Comparison: Previous exam(s).

CLINICAL DATA: Screening.

EXAM:
DIGITAL SCREENING BILATERAL MAMMOGRAM WITH TOMO AND CAD

[R CC synth-2D]
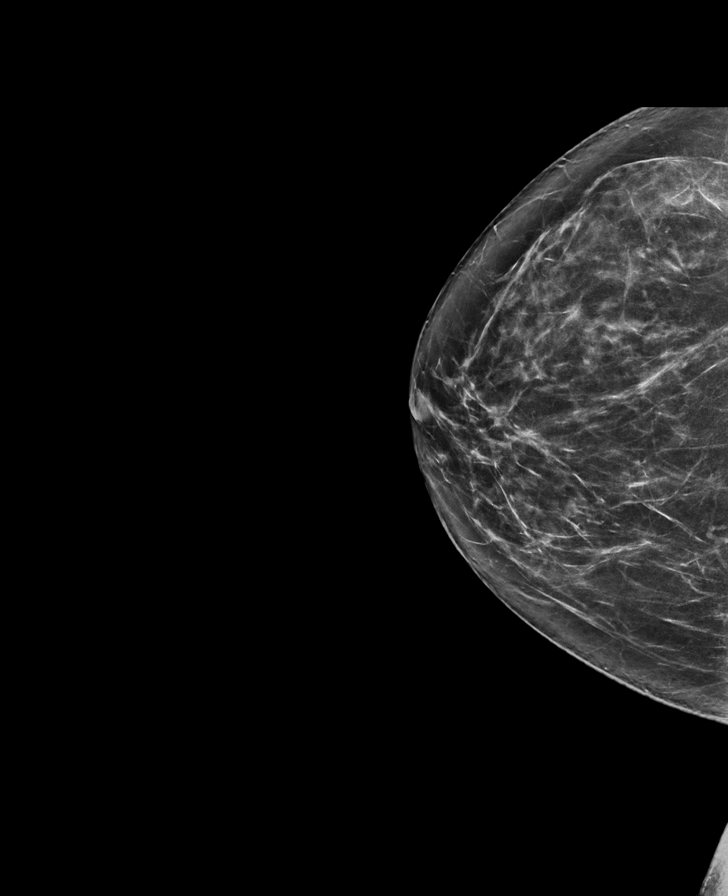

[L MLO synth-2D]
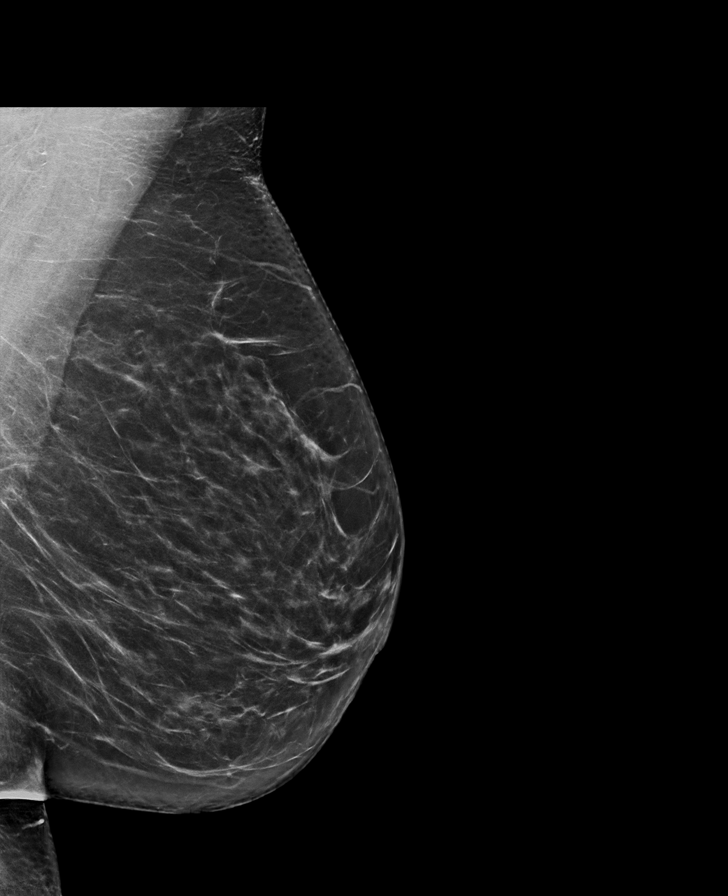

[L CC synth-2D]
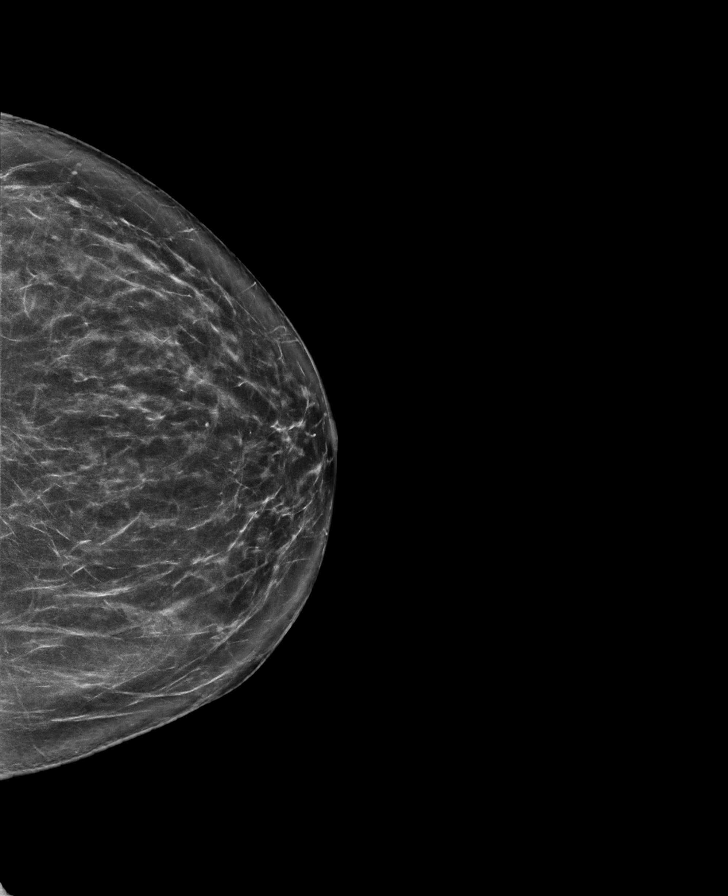

[R MLO synth-2D]
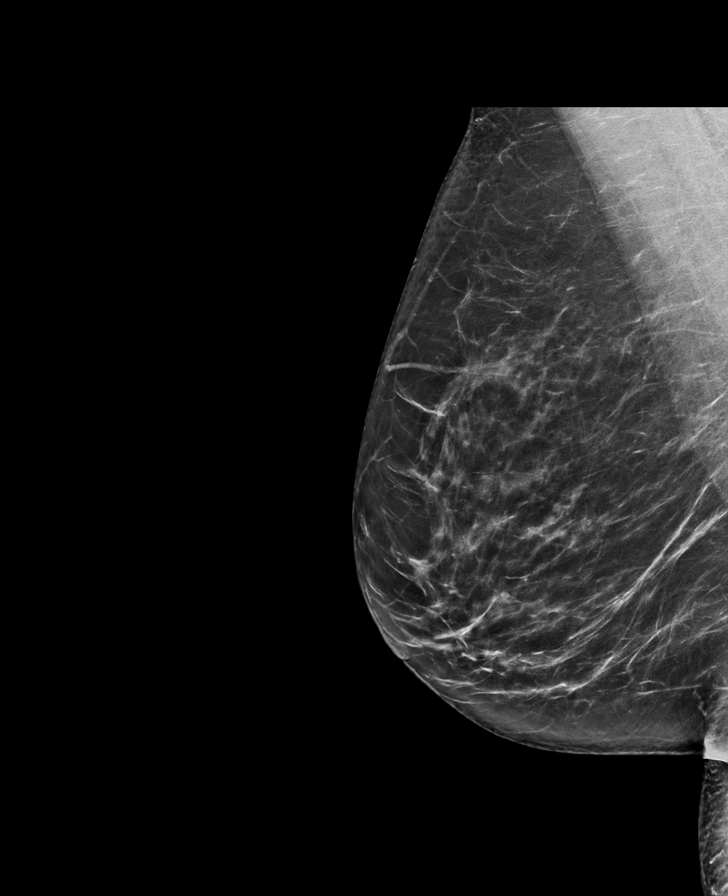

[R MLO tomo · tomo slice 39/78.0]
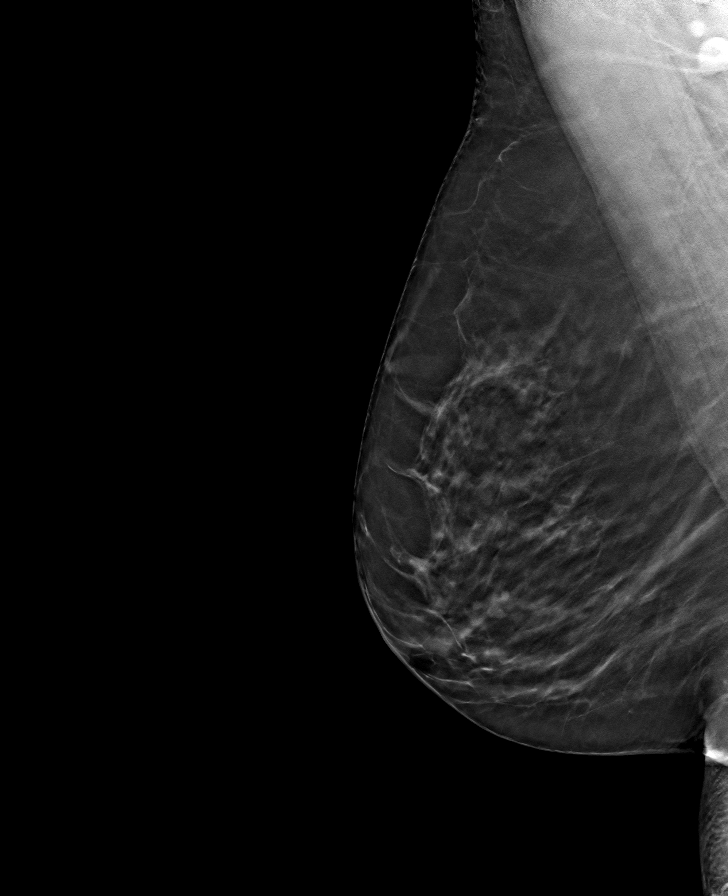

[L MLO tomo · tomo slice 41/80.0]
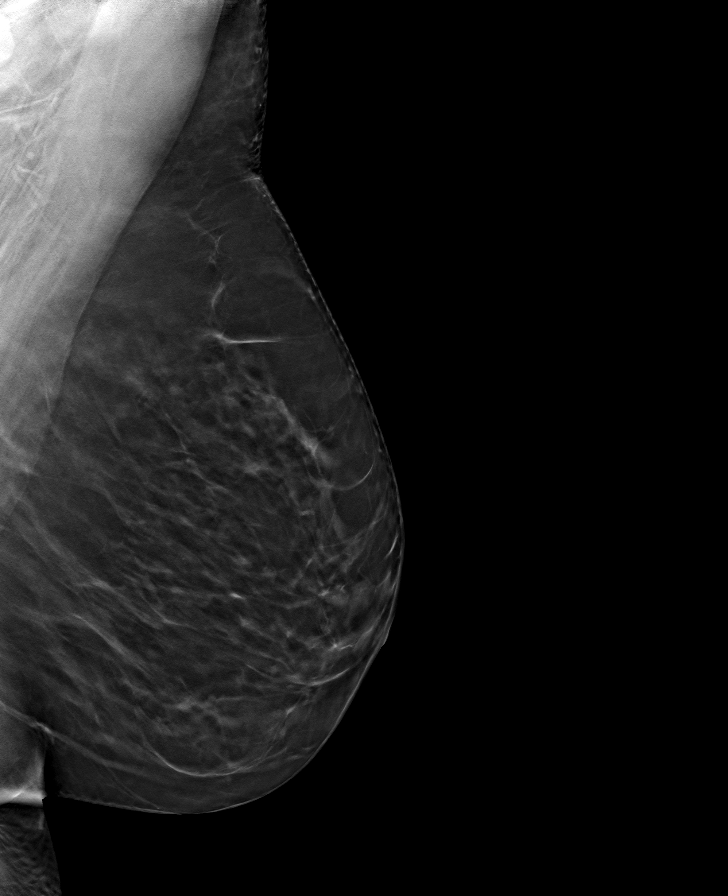

[R CC tomo · tomo slice 39/76.0]
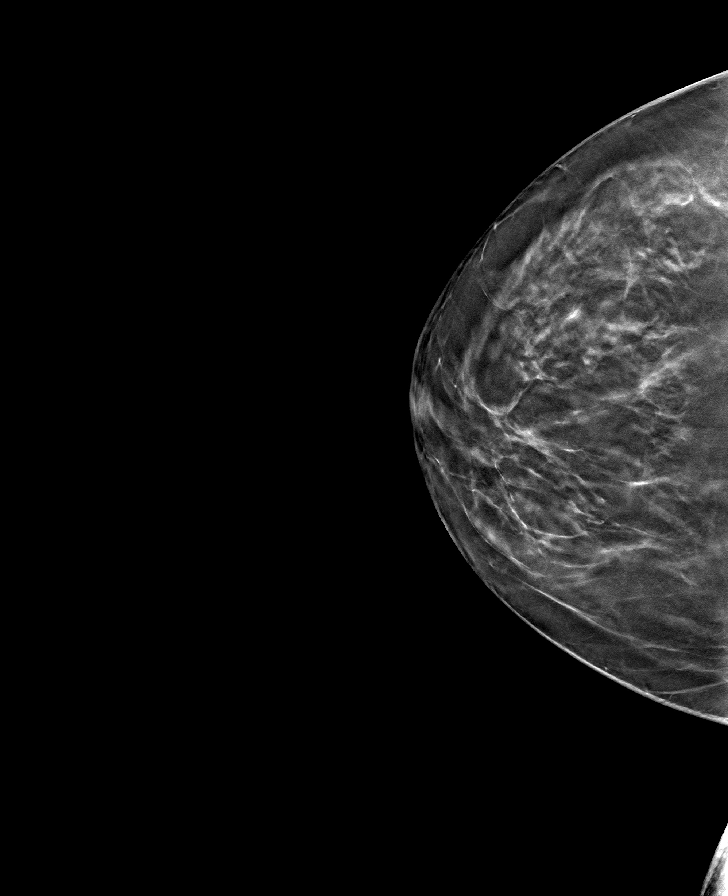

[L CC tomo · tomo slice 39/76.0]
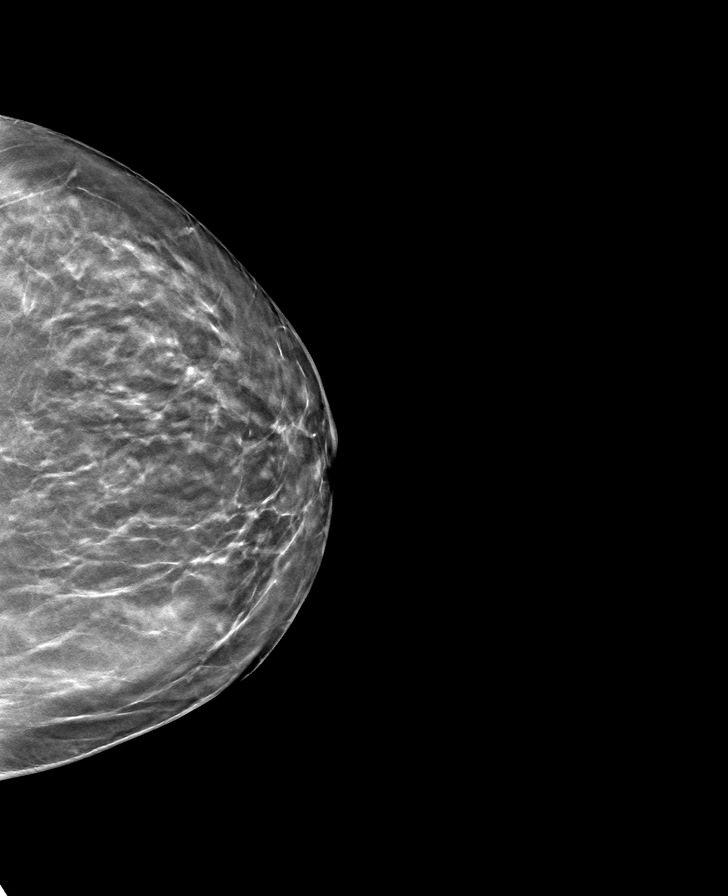

[8 of 24 positions shown; findings below may reference images not displayed]

ACR Breast Density Category b: There are scattered areas of
fibroglandular density.
FINDINGS: There are no findings suspicious for malignancy. Images were
processed with CAD.
IMPRESSION: No mammographic evidence of malignancy. A result letter of this
screening mammogram will be mailed directly to the patient.

RECOMMENDATION:
Screening mammogram in one year. (Code:[TQ])

BI-RADS CATEGORY  1: Negative.

## 2018-08-03 ENCOUNTER — Other Ambulatory Visit: Payer: Self-pay | Admitting: Physician Assistant

## 2018-09-26 ENCOUNTER — Ambulatory Visit: Payer: Managed Care, Other (non HMO) | Admitting: Family Medicine

## 2018-09-27 ENCOUNTER — Ambulatory Visit: Payer: Managed Care, Other (non HMO) | Admitting: Family Medicine

## 2018-09-27 ENCOUNTER — Encounter: Payer: Self-pay | Admitting: Family Medicine

## 2018-09-27 VITALS — BP 110/76 | HR 87 | Temp 98.2°F | Ht 67.25 in | Wt 195.4 lb

## 2018-09-27 DIAGNOSIS — J069 Acute upper respiratory infection, unspecified: Secondary | ICD-10-CM

## 2018-09-27 DIAGNOSIS — J209 Acute bronchitis, unspecified: Secondary | ICD-10-CM

## 2018-09-27 MED ORDER — AZITHROMYCIN 250 MG PO TABS: ORAL_TABLET | ORAL | 0 refills | Status: DC

## 2018-09-27 MED ORDER — PREDNISONE 20 MG PO TABS: 40.0000 mg | ORAL_TABLET | Freq: Every day | ORAL | 0 refills | Status: AC

## 2018-09-27 MED ORDER — BENZONATATE 100 MG PO CAPS: 100.0000 mg | ORAL_CAPSULE | Freq: Three times a day (TID) | ORAL | 0 refills | Status: DC | PRN

## 2018-09-27 MED ORDER — LEVALBUTEROL TARTRATE 45 MCG/ACT IN AERO: 1.0000 | INHALATION_SPRAY | RESPIRATORY_TRACT | 0 refills | Status: DC | PRN

## 2018-09-27 MED ORDER — PREDNISONE 20 MG PO TABS: 40.0000 mg | ORAL_TABLET | Freq: Every day | ORAL | 0 refills | Status: DC

## 2018-09-27 NOTE — Progress Notes (Signed)
Patient ID: Tina Cruz, female    DOB: 1961-05-28, 58 y.o.   MRN: [id number]  PCP: [name], MD  Chief Complaint  Patient presents with  . Cough    Patient in today with c/o productive cough, Sob. Onset of symptoms October    Subjective:   Tina Cruz is a 57 y.o. female, presents to clinic with CC of cough with wheeze that has been ongoing and gradually worsening for about 2 months.  She has xopenex which helps a little bit, but recently her wheeze has come more frequently and cough has become more forceful, productive with exertional shortness of breath with minor activity.  She does have a history of GERD and allergic rhinitis.  She does believe she has some nasal symptoms continuing and possibly a recent URI.  No recent sweats, chest pain, weight loss.   Patient Active Problem List   Diagnosis Date Noted  . Allergic rhinitis 07/27/2018  . GERD (gastroesophageal reflux disease) 01/14/2015  . Thyroid disease      Prior to Admission medications   Medication Sig Start Date End Date Taking? Authorizing Provider  Cetirizine HCl (ZYRTEC ALLERGY) 10 MG CAPS Take 1 capsule (10 mg total) by mouth daily as needed (allergies). 07/27/18  Yes Dena Billet B, PA-C  cyclobenzaprine (FLEXERIL) 10 MG tablet Take 1 tablet (10 mg total) by mouth 3 (three) times daily as needed for muscle spasms. 05/24/17  Yes Dena Billet B, PA-C  Ibuprofen (ADVIL PO) Take 1 tablet by mouth as needed (220mg  OTC).   Yes [provider]  levothyroxine (SYNTHROID, LEVOTHROID) 112 MCG tablet TAKE 1 TABLET (112 MCG TOTAL) BY MOUTH DAILY BEFORE BREAKFAST. 08/03/18  Yes Susy Frizzle, MD  ranitidine (ZANTAC) 150 MG tablet Take 150 mg by mouth as needed for heartburn (not RX pt buys OTC).   Yes [provider]     Allergies  Allergen Reactions  . Albuterol Hives and Itching     Family History  Problem Relation Age of Onset  . Cancer Mother        colon  . Alcohol  abuse Father      Social History   Socioeconomic History  . Marital status: Married    Spouse name: Not on file  . Number of children: Not on file  . Years of education: Not on file  . Highest education level: Not on file  Occupational History  . Not on file  Social Needs  . Financial resource strain: Not on file  . Food insecurity:    Worry: Not on file    Inability: Not on file  . Transportation needs:    Medical: Not on file    Non-medical: Not on file  Tobacco Use  . Smoking status: Former Smoker    Packs/day: 0.50    Types: Cigarettes    Last attempt to quit: 06/03/2015    Years since quitting: 3.3  . Smokeless tobacco: Never Used  Substance and Sexual Activity  . Alcohol use: No    Alcohol/week: 0.0 standard drinks  . Drug use: No  . Sexual activity: Not on file  Lifestyle  . Physical activity:    Days per week: Not on file    Minutes per session: Not on file  . Stress: Not on file  Relationships  . Social connections:    Talks on phone: Not on file    Gets together: Not on file    Attends religious service: Not  on file    Active member of club or organization: Not on file    Attends meetings of clubs or organizations: Not on file    Relationship status: Not on file  . Intimate partner violence:    Fear of current or ex partner: Not on file    Emotionally abused: Not on file    Physically abused: Not on file    Forced sexual activity: Not on file  Other Topics Concern  . Not on file  Social History Narrative  . Not on file     Review of Systems  Constitutional: Negative.   HENT: Negative.   Eyes: Negative.   Respiratory: Negative.   Cardiovascular: Negative.   Gastrointestinal: Negative.   Endocrine: Negative.   Genitourinary: Negative.   Musculoskeletal: Negative.   Skin: Negative.   Allergic/Immunologic: Negative.   Neurological: Negative.   Hematological: Negative.   Psychiatric/Behavioral: Negative.   All other systems reviewed and  are negative.      Objective:    Vitals:   09/27/18 0823  BP: 110/76  Pulse: 87  Temp: 98.2 F (36.8 C)  TempSrc: Oral  SpO2: 97%  Weight: 195 lb 6 oz (88.6 kg)  Height: 5' 7.25" (1.708 m)      Physical Exam Constitutional:      General: She is not in acute distress.    Appearance: Normal appearance. She is well-developed. She is not toxic-appearing or diaphoretic.  HENT:     Head: Normocephalic and atraumatic.     Right Ear: External ear normal.     Left Ear: External ear normal.     Nose: Congestion and rhinorrhea present.     Mouth/Throat:     Mouth: Mucous membranes are moist.     Pharynx: Uvula midline. Posterior oropharyngeal erythema present.  Eyes:     General: Lids are normal.     Conjunctiva/sclera: Conjunctivae normal.     Pupils: Pupils are equal, round, and reactive to light.  Neck:     Musculoskeletal: Normal range of motion and neck supple.     Trachea: Phonation normal. No tracheal deviation.  Cardiovascular:     Rate and Rhythm: Normal rate and regular rhythm.     Pulses: Normal pulses.          Radial pulses are 2+ on the right side and 2+ on the left side.       Posterior tibial pulses are 2+ on the right side and 2+ on the left side.     Heart sounds: Normal heart sounds. No murmur. No friction rub. No gallop.   Pulmonary:     Effort: Pulmonary effort is normal. No respiratory distress.     Breath sounds: No stridor. Wheezing and rhonchi present. No rales.  Chest:     Chest wall: No tenderness.  Abdominal:     General: Bowel sounds are normal. There is no distension.     Palpations: Abdomen is soft.     Tenderness: There is no abdominal tenderness. There is no guarding or rebound.  Musculoskeletal: Normal range of motion.        General: No deformity.  Lymphadenopathy:     Cervical: No cervical adenopathy.  Skin:    General: Skin is warm and dry.     Capillary Refill: Capillary refill takes less than 2 seconds.     Coloration: Skin is  not pale.     Findings: No rash.  Neurological:     Mental Status: She is alert  and oriented to person, place, and time.     Motor: No abnormal muscle tone.     Gait: Gait normal.  Psychiatric:        Speech: Speech normal.        Behavior: Behavior normal.           Assessment & Plan:      ICD-10-CM   1. Upper respiratory tract infection, unspecified type J06.9    suspect viral infection   2. Bronchospasm with bronchitis, acute J20.9 azithromycin (ZITHROMAX) 250 MG tablet    benzonatate (TESSALON) 100 MG capsule    levalbuterol (XOPENEX HFA) 45 MCG/ACT inhaler    predniSONE (DELTASONE) 20 MG tablet    DISCONTINUED: predniSONE (DELTASONE) 20 MG tablet    DISCONTINUED: azithromycin (ZITHROMAX) 250 MG tablet    DISCONTINUED: levalbuterol (XOPENEX HFA) 45 MCG/ACT inhaler   hx of same, only tolerates xopenex, tx with steroid and inhaler, f/up if not improving.    With 2 months of persistent cough with bronchospasm pt may need a maintenance inhaler.  Will see first if she improves with steroids, mucinex and zpak.  If no improvement may need CXR, PFT's and/or maintenance inhaler.   Delsa Grana, PA-C 09/27/18 8:44 AM

## 2018-09-27 NOTE — Patient Instructions (Addendum)
Get and start taking a daily steroid nasal spray like flonase or nasonex.  Try and use the xopenex inhaler as needed for wheeze or shortness of breath. If you develop hives take 50 mg of benadryl right away and let us know  If you are still having shortness of breath and wheeze - please contact us early next week (Monday) and we will give you a sample medicine to try (different daily inhaler)  Your prescription coverage shows me on this computer nothing is covered so I sent it to [facility] and then also printed the meds so that you could look them up on Goodrx.com and find other pharmacies to try and get them at for cash prices.    Also start taking mucinex daily

## 2018-10-03 ENCOUNTER — Telehealth: Payer: Self-pay | Admitting: Family Medicine

## 2018-10-03 NOTE — Telephone Encounter (Signed)
Patient called in today stating that she is no better from her visit on 09/27/18 with Leisa.Patient states that she has completed the Z pak, has been taking mucinex and flonase and cough is no better. She denies fever and SOB. Patient would like to know if there is anything else we can call in. Please advise?

## 2018-10-03 NOTE — Telephone Encounter (Signed)
Left message return call

## 2018-10-03 NOTE — Telephone Encounter (Signed)
The cough is going to linger, you can call in Robitussin Codiene if she can tolerate Use inhaler as needed  F/U Friday for recheck

## 2018-10-07 ENCOUNTER — Ambulatory Visit: Payer: Managed Care, Other (non HMO) | Admitting: Family Medicine

## 2018-10-07 ENCOUNTER — Other Ambulatory Visit: Payer: Self-pay

## 2018-10-07 ENCOUNTER — Encounter: Payer: Self-pay | Admitting: Family Medicine

## 2018-10-07 VITALS — BP 120/68 | HR 88 | Temp 99.2°F | Resp 18 | Ht 67.25 in | Wt 194.0 lb

## 2018-10-07 DIAGNOSIS — J0110 Acute frontal sinusitis, unspecified: Secondary | ICD-10-CM

## 2018-10-07 DIAGNOSIS — R05 Cough: Secondary | ICD-10-CM | POA: Diagnosis not present

## 2018-10-07 DIAGNOSIS — R059 Cough, unspecified: Secondary | ICD-10-CM

## 2018-10-07 MED ORDER — HYDROCOD POLST-CPM POLST ER 10-8 MG/5ML PO SUER: 5.0000 mL | Freq: Two times a day (BID) | ORAL | 0 refills | Status: DC | PRN

## 2018-10-07 MED ORDER — AMOXICILLIN-POT CLAVULANATE 875-125 MG PO TABS: 1.0000 | ORAL_TABLET | Freq: Two times a day (BID) | ORAL | 0 refills | Status: DC

## 2018-10-07 NOTE — Progress Notes (Signed)
   Subjective:    Patient ID: Tina Cruz, female    DOB: [birth year], 57 y.o.   MRN: [id number]  Patient presents for Illness (x weeks- URI- cough has not improved)   Pt here with continued cold and congestion. Seen on 17th   Prescribed zpak, prednisone, xopenex, tessalon which has not helped   Coug is the worst, sometimes has some production but mostly in the evening. Also has some sore throat and sinus pressure and headache that started after the cough.   She did try the xopenex but little relief   Has been taking sinus OTC medicine, mucinex.  Has been taking zyrtec       No vomiting or diarrhea      Non smoker         Review Of Systems:  GEN- denies fatigue, fever, weight loss,weakness, recent illness HEENT- denies eye drainage, change in vision, +nasal discharge, CVS- denies chest pain, palpitations RESP- denies SOB, +cough, wheeze ABD- denies N/V, change in stools, abd pain GU- denies dysuria, hematuria, dribbling, incontinence MSK- denies joint pain, muscle aches, injury Neuro- denies headache, dizziness, syncope, seizure activity       Objective:    BP 120/68   Pulse 88   Temp 99.2 F (37.3 C) (Oral)   Resp 18   Ht 5' 7.25" (1.708 m)   Wt 194 lb (88 kg)   SpO2 93%   BMI 30.16 kg/m  GEN- NAD, alert and oriented x3 HEENT- PERRL, EOMI, non injected sclera, pink conjunctiva, MMM, oropharynx mild injection, TM clear bilat no effusion,  + left frontal sinus tenderness, inflammed turbinates,  Nasal drainage  Neck- Supple, + ant LAD CVS- RRR, no murmur RESP-CTAB, coughing fits, no wheeze  EXT- No edema Pulses- Radial 2+          Assessment & Plan:      Problem List Items Addressed This Visit    None    Visit Diagnoses    Acute non-recurrent frontal sinusitis    -  Primary   Augmentin, flonase, mucinex, zyrtec. Tussionex for cough. no red flags, can use xopenex for true bronchospasm   Relevant Medications   amoxicillin-clavulanate (AUGMENTIN)  875-125 MG tablet   chlorpheniramine-HYDROcodone (TUSSIONEX PENNKINETIC ER) 10-8 MG/5ML SUER   Cough          Note: This dictation was prepared with Dragon dictation along with smaller phrase technology. Any transcriptional errors that result from this process are unintentional.

## 2018-10-07 NOTE — Patient Instructions (Signed)
Use flonase 1 spray each nostril twice a day  Take antibiotics - Augmentin  Take Tussionex syrup  Xopenex as needed

## 2018-10-13 NOTE — Telephone Encounter (Signed)
Patient seen on 10/07/18

## 2018-10-25 ENCOUNTER — Telehealth: Payer: Self-pay | Admitting: *Deleted

## 2018-10-25 MED ORDER — PREDNISONE 20 MG PO TABS: 40.0000 mg | ORAL_TABLET | Freq: Every day | ORAL | 0 refills | Status: DC

## 2018-10-25 MED ORDER — AZITHROMYCIN 250 MG PO TABS: ORAL_TABLET | ORAL | 0 refills | Status: DC

## 2018-10-25 NOTE — Telephone Encounter (Signed)
Prescription sent to pharmacy. .   Call placed to patient and patient made aware.  

## 2018-10-25 NOTE — Telephone Encounter (Signed)
Received call from patient.   States that she continues to have sinus pressure and HA. Reports that cough continues and she now has pain under L breast from coughing so much.   Denies fever.  MD please advise.

## 2018-10-25 NOTE — Telephone Encounter (Signed)
Change to zpak, prednisone 40mg  daily for 5 days She has tussionex already Use nasal saline or flonse as tolerated Recheck in office is still not improving

## 2018-10-31 ENCOUNTER — Telehealth: Payer: Self-pay | Admitting: *Deleted

## 2018-10-31 NOTE — Telephone Encounter (Signed)
Received call from patient.   Reports that she is still not improved.  Per last chart message, patient will need to be seen.   Call placed to patient. No answer. No VM.

## 2018-11-02 ENCOUNTER — Ambulatory Visit
Admission: RE | Admit: 2018-11-02 | Discharge: 2018-11-02 | Disposition: A | Discharge status: Home / Self Care | Payer: Managed Care, Other (non HMO) | Source: Ambulatory Visit | Attending: Family Medicine | Admitting: Family Medicine

## 2018-11-02 ENCOUNTER — Ambulatory Visit: Payer: Managed Care, Other (non HMO) | Admitting: Family Medicine

## 2018-11-02 ENCOUNTER — Encounter: Payer: Self-pay | Admitting: Family Medicine

## 2018-11-02 VITALS — BP 110/80 | HR 91 | Temp 98.4°F | Resp 16 | Ht 67.5 in | Wt 192.0 lb

## 2018-11-02 DIAGNOSIS — R05 Cough: Secondary | ICD-10-CM | POA: Diagnosis not present

## 2018-11-02 DIAGNOSIS — R9389 Abnormal findings on diagnostic imaging of other specified body structures: Secondary | ICD-10-CM | POA: Diagnosis not present

## 2018-11-02 DIAGNOSIS — J329 Chronic sinusitis, unspecified: Secondary | ICD-10-CM | POA: Diagnosis not present

## 2018-11-02 DIAGNOSIS — R059 Cough, unspecified: Secondary | ICD-10-CM

## 2018-11-02 IMAGING — DX DG CHEST 2V
2 series · 2 of 2 positions shown · non-contrast
Comparison: None.

CLINICAL DATA: Persistent productive cough for 3 months despite 3
rounds of antibiotics.

EXAM:
CHEST - 2 VIEW

[dg chest 2 view (1 of 2)]
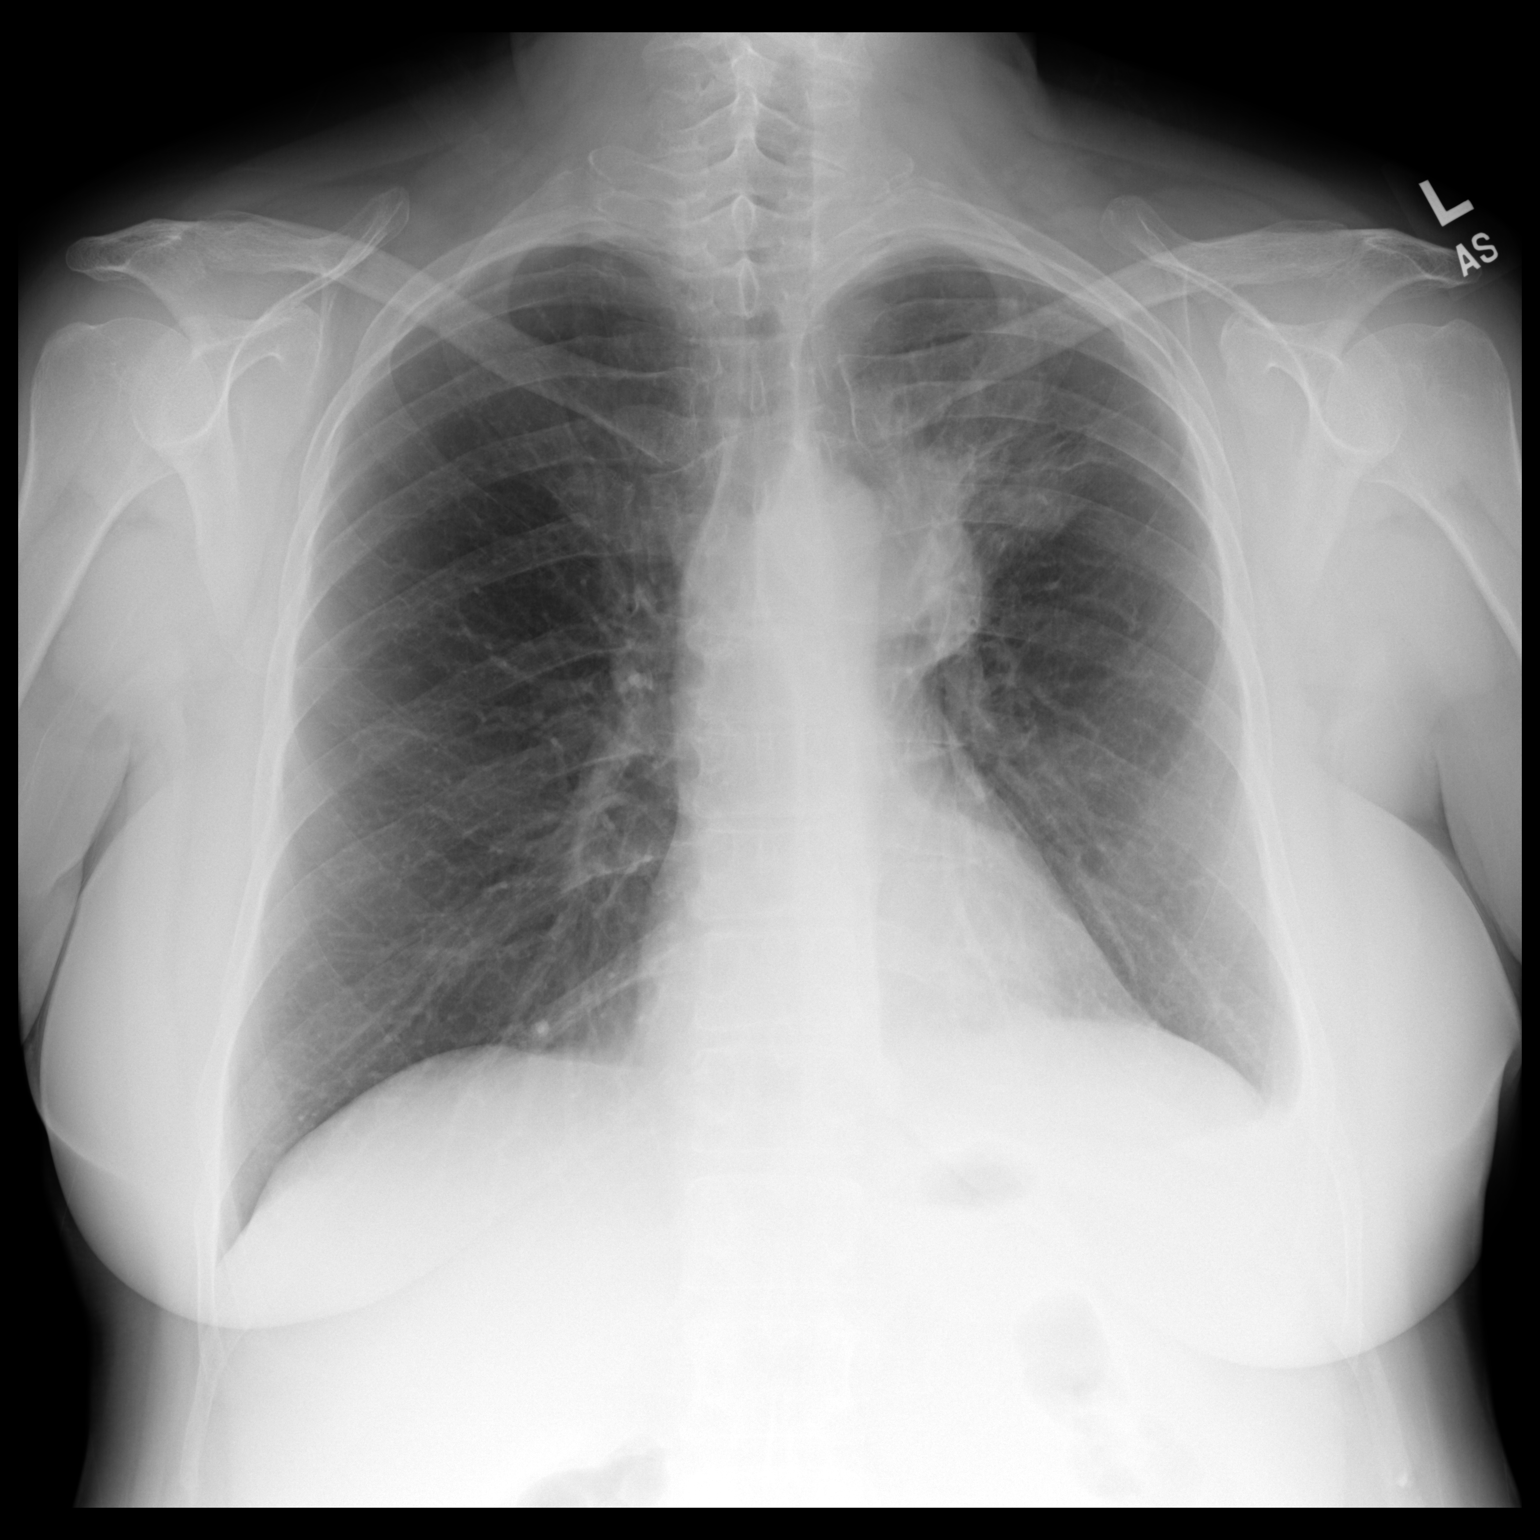

[dg chest 2 view (2 of 2)]
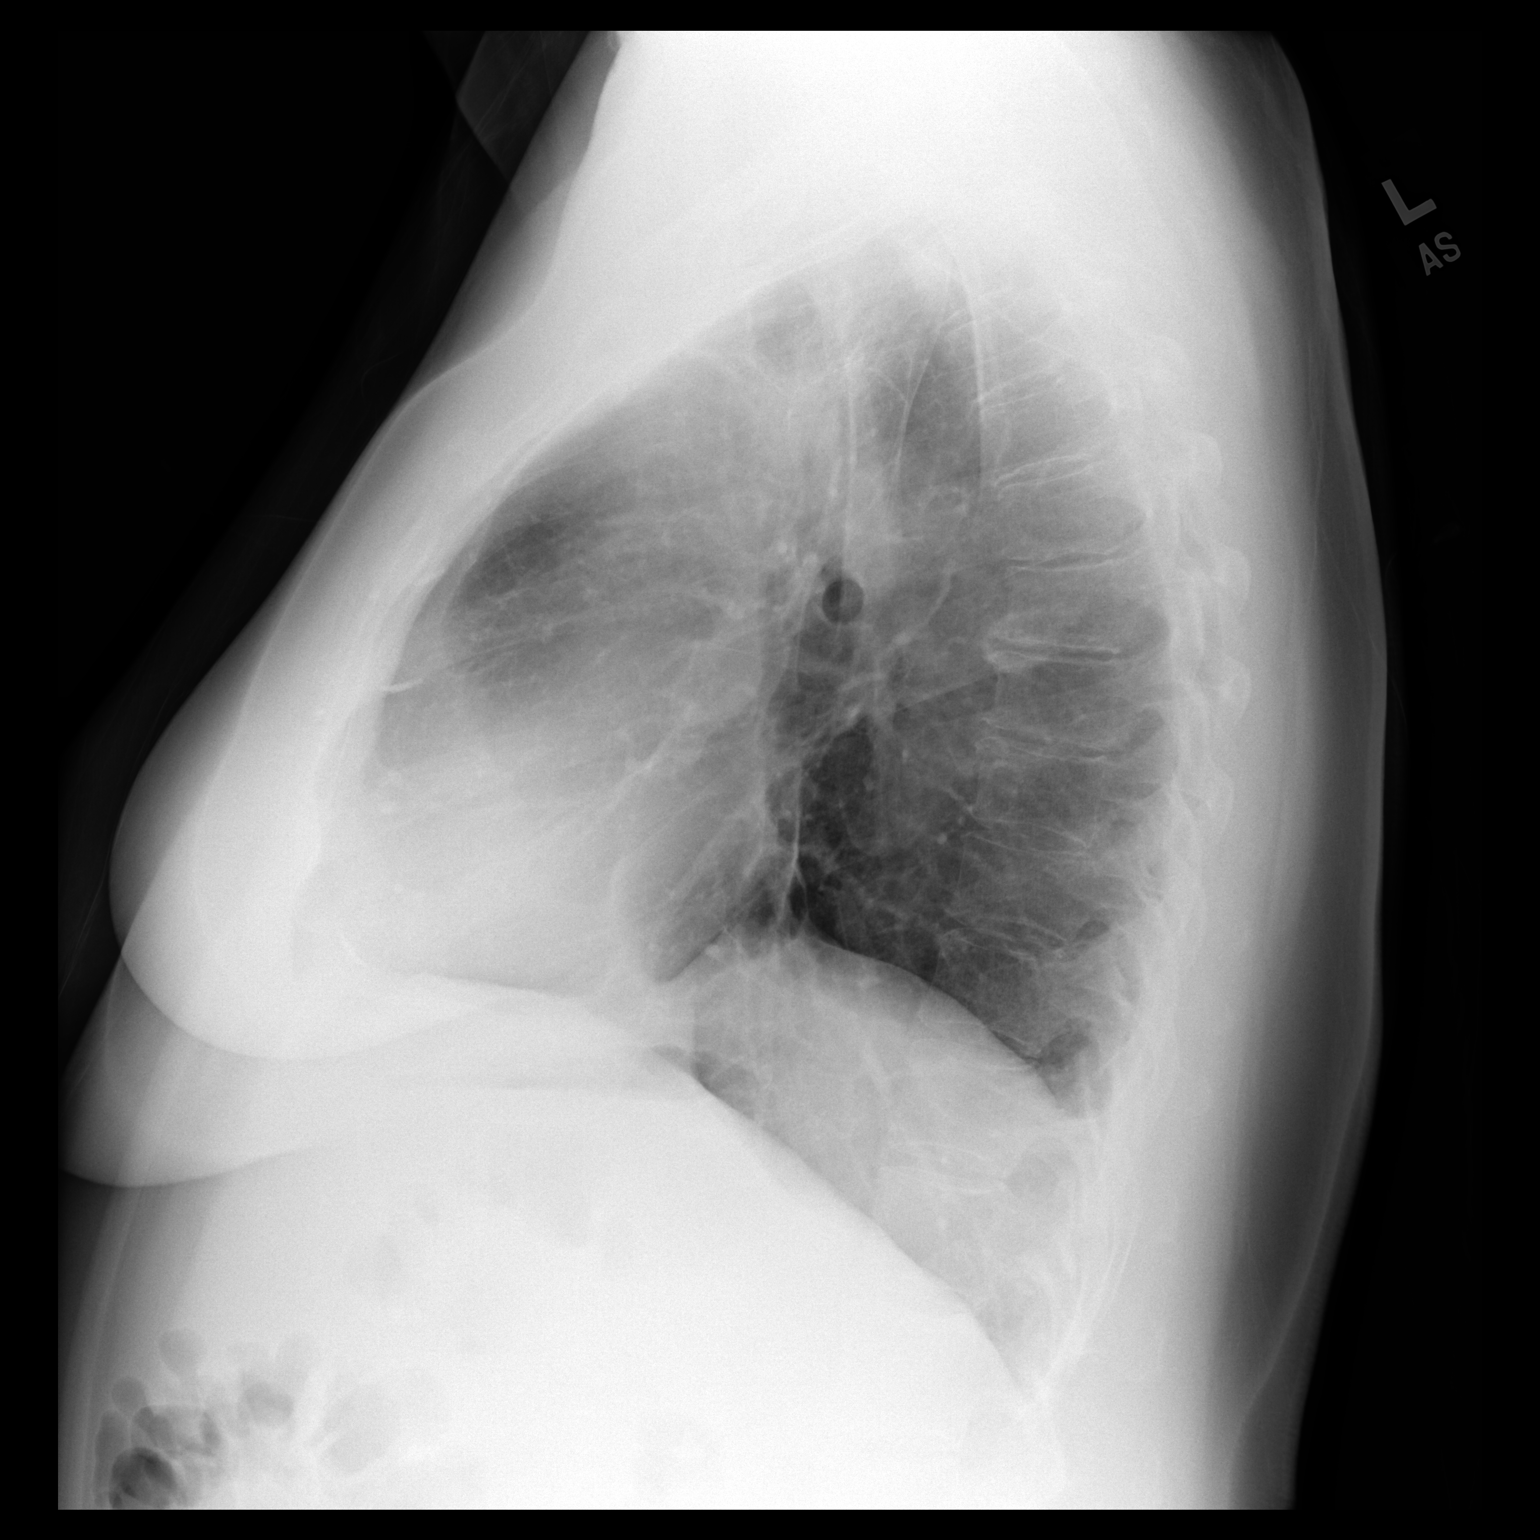

[2 of 2 positions shown; findings below may reference images not displayed]

FINDINGS: The heart size is normal. There is suspicion of a mass in the AP
window with adjacent suprahilar airspace disease and mild volume
loss. The right lung is clear. There is no pleural effusion or
pneumothorax. No acute osseous findings are evident.
IMPRESSION: Left suprahilar airspace disease with volume loss and suspicion of
underlying mass in the AP window. Although potentially secondary to
pneumonia, these findings are worrisome for underlying bronchogenic
carcinoma, especially given the duration of the patient's symptoms.
Further evaluation with chest CT with contrast recommended.

These results will be called to the ordering clinician or
representative by the Radiologist Assistant, and communication
documented in the PACS or zVision Dashboard.

## 2018-11-02 MED ORDER — MONTELUKAST SODIUM 10 MG PO TABS: 10.0000 mg | ORAL_TABLET | Freq: Every day | ORAL | 3 refills | Status: DC

## 2018-11-02 MED ORDER — LEVOFLOXACIN 750 MG PO TABS: 750.0000 mg | ORAL_TABLET | Freq: Every day | ORAL | 0 refills | Status: AC

## 2018-11-02 MED ORDER — AZELASTINE HCL 0.1 % NA SOLN: 1.0000 | Freq: Two times a day (BID) | NASAL | 12 refills | Status: AC | PRN

## 2018-11-02 MED ORDER — PREDNISONE 20 MG PO TABS: ORAL_TABLET | ORAL | 0 refills | Status: DC

## 2018-11-02 NOTE — Telephone Encounter (Signed)
Patient returned call and made aware.   Appointment scheduled.   

## 2018-11-02 NOTE — Progress Notes (Signed)
Patient ID: Tina Cruz, female    DOB: [birth year], 58 y.o.   MRN: [id number]  PCP: [name], MD  Chief Complaint  Patient presents with  . URI    Nasal congestion, chest congestion, post nasal drip    Subjective:   Tina Cruz is a 58 y.o. female, presents to clinic with CC of continued and recurrent sinus pain, pressure and drainage with cough, intermittently productive.  She states that she has been coughing ever since October of last year her sinuses have also continued to be problematic since same time.  She has been seen in clinic multiple times she has had 3 rounds of steroids, 3 rounds of antibiotics including Z-Pak then Augmentin and then another Z-Pak.  She states each time she gets a little bit better and about a week after she finishes antibiotic she gets worse again.  She is using Zyrtec and Flonase most days but not every day.  She states that she feels generally ill coughing continue to make her ribs and abs sore.  She does have Xopenex and it does help a little bit.  She just feels rundown, fatigue and ill again.  This morning she had acute worsening of her sinus pain and pressure goes behind her left eye.  She has used over-the-counter Mucinex which did help her cough but nothing else has improved the sinus pain and pressure.  She does not believe she has had fever sweats chills decreased appetite and she does not have shortness of breath or wheeze right now.  She has had bronchitis multiple times.  Has a rash reaction allergy to albuterol.  Does have Xopenex we are able to get this approved through her insurance again.  She has no history of COPD.  She quit smoking 5 years ago and says that for about 3 years she smoked maybe quarter to half a pack a day.  Ever had any lung disease asthma history in her past.  She says she does work outside [employer] has been difficult for her, she works with lawns, and the leaves, grass and plants seems to bothering  her sinuses.  No allergy or sinusitis hx in the past like this.  She has not been to ENT, she has not tried switching nasal sprays, antihistamines and she does not believe she is ever been on Singulair in the past.     Patient Active Problem List   Diagnosis Date Noted  . Allergic rhinitis 07/27/2018  . Hemangioma 03/11/2017  . Seborrheic keratosis 03/11/2017  . Skin tag 03/11/2017  . GERD (gastroesophageal reflux disease) 01/14/2015  . Thyroid disease      Prior to Admission medications   Medication Sig Start Date End Date Taking? Authorizing Provider  Cetirizine HCl (ZYRTEC ALLERGY) 10 MG CAPS Take 1 capsule (10 mg total) by mouth daily as needed (allergies). 07/27/18  Yes Dena Billet B, PA-C  Ibuprofen (ADVIL PO) Take 1 tablet by mouth as needed (220mg  OTC).   Yes [provider]  levalbuterol (XOPENEX HFA) 45 MCG/ACT inhaler Inhale 1-2 puffs into the lungs every 4 (four) hours as needed for wheezing or shortness of breath. 09/27/18  Yes Delsa Grana, PA-C  levothyroxine (SYNTHROID, LEVOTHROID) 112 MCG tablet TAKE 1 TABLET (112 MCG TOTAL) BY MOUTH DAILY BEFORE BREAKFAST. 08/03/18  Yes Susy Frizzle, MD  ranitidine (ZANTAC) 150 MG tablet Take 150 mg by mouth as needed for heartburn (not RX pt buys OTC).   Yes [provider]  chlorpheniramine-HYDROcodone (TUSSIONEX PENNKINETIC ER) 10-8 MG/5ML SUER Take 5 mLs by mouth every 12 (twelve) hours as needed. Patient not taking: Reported on 11/02/2018 10/07/18   [name], MD  cyclobenzaprine (FLEXERIL) 10 MG tablet Take 1 tablet (10 mg total) by mouth 3 (three) times daily as needed for muscle spasms. Patient not taking: Reported on 11/02/2018 05/24/17   Dena Billet B, PA-C     Allergies  Allergen Reactions  . Albuterol Hives and Itching     Family History  Problem Relation Age of Onset  . Cancer Mother        colon  . Alcohol abuse Father      Social History   Socioeconomic History  . Marital  status: Married    Spouse name: Not on file  . Number of children: Not on file  . Years of education: Not on file  . Highest education level: Not on file  Occupational History  . Not on file  Social Needs  . Financial resource strain: Not on file  . Food insecurity:    Worry: Not on file    Inability: Not on file  . Transportation needs:    Medical: Not on file    Non-medical: Not on file  Tobacco Use  . Smoking status: Former Smoker    Packs/day: 0.50    Types: Cigarettes    Last attempt to quit: 06/03/2015    Years since quitting: 3.4  . Smokeless tobacco: Never Used  Substance and Sexual Activity  . Alcohol use: No    Alcohol/week: 0.0 standard drinks  . Drug use: No  . Sexual activity: Not on file  Lifestyle  . Physical activity:    Days per week: Not on file    Minutes per session: Not on file  . Stress: Not on file  Relationships  . Social connections:    Talks on phone: Not on file    Gets together: Not on file    Attends religious service: Not on file    Active member of club or organization: Not on file    Attends meetings of clubs or organizations: Not on file    Relationship status: Not on file  . Intimate partner violence:    Fear of current or ex partner: Not on file    Emotionally abused: Not on file    Physically abused: Not on file    Forced sexual activity: Not on file  Other Topics Concern  . Not on file  Social History Narrative  . Not on file     Review of Systems  Constitutional: Negative.   HENT: Negative.   Eyes: Negative.   Respiratory: Negative.   Cardiovascular: Negative.   Gastrointestinal: Negative.   Endocrine: Negative.   Genitourinary: Negative.   Musculoskeletal: Negative.   Skin: Negative.   Allergic/Immunologic: Negative.   Neurological: Negative.   Hematological: Negative.   Psychiatric/Behavioral: Negative.   All other systems reviewed and are negative.      Objective:    Vitals:   11/02/18 1414  BP: 110/80   Pulse: 91  Resp: 16  Temp: 98.4 F (36.9 C)  TempSrc: Oral  SpO2: 98%  Weight: 192 lb (87.1 kg)  Height: 5' 7.5" (1.715 m)      Physical Exam Vitals signs and nursing note reviewed.  Constitutional:      General: She is not in acute distress.    Appearance: She is well-developed. She is not ill-appearing, toxic-appearing or diaphoretic.  HENT:     Head: Normocephalic and atraumatic.     Right Ear: Hearing, tympanic membrane, ear canal and external ear normal.     Left Ear: Hearing, tympanic membrane, ear canal and external ear normal.     Nose: Mucosal edema, congestion and rhinorrhea present.     Right Sinus: No maxillary sinus tenderness or frontal sinus tenderness.     Left Sinus: No maxillary sinus tenderness or frontal sinus tenderness.     Comments: No ttp to sinuses, nasal mucosa very erythematous    Mouth/Throat:     Mouth: Mucous membranes are moist. Mucous membranes are not pale.     Pharynx: Uvula midline. No oropharyngeal exudate, posterior oropharyngeal erythema or uvula swelling.     Tonsils: No tonsillar abscesses.  Eyes:     General:        Right eye: No discharge.        Left eye: No discharge.     Conjunctiva/sclera: Conjunctivae normal.     Pupils: Pupils are equal, round, and reactive to light.  Neck:     Musculoskeletal: Normal range of motion and neck supple.     Trachea: No tracheal deviation.  Cardiovascular:     Rate and Rhythm: Normal rate and regular rhythm.     Heart sounds: Normal heart sounds.  Pulmonary:     Effort: Pulmonary effort is normal. No respiratory distress.     Breath sounds: No stridor. Rhonchi and rales present. No wheezing.  Chest:     Chest wall: Tenderness present.  Abdominal:     General: Bowel sounds are normal. There is no distension.     Palpations: Abdomen is soft.  Musculoskeletal: Normal range of motion.  Skin:    General: Skin is warm and dry.     Coloration: Skin is not pale.     Findings: No rash.    Neurological:     Mental Status: She is alert.     Motor: No abnormal muscle tone.     Coordination: Coordination normal.  Psychiatric:        Behavior: Behavior normal.           Assessment & Plan:      ICD-10-CM   1. Cough R05 DG Chest 2 View    predniSONE (DELTASONE) 20 MG tablet  2. Rhinosinusitis J32.9 predniSONE (DELTASONE) 20 MG tablet    montelukast (SINGULAIR) 10 MG tablet    azelastine (ASTELIN) 0.1 % nasal spray   chronic for the past 3-4 months, some allergic component, works outdoors plants/yards    Will get chest x-ray with several months of coughing she has some history of bronchitis but no diagnosis of COPD and no other history of lung disease, very small history of smoking 1-2 pack year.  Allergic component seems to be undercontrolled.  Max out med tx for allergic rhinitis, shorter lower steroid burst, CXR to further evaluate.  X-ray results to guide for another round of abx - I would use doxy or levaquin with this hx.     Delsa Grana, PA-C 11/02/18 2:38 PM  5:42 PM Received x-ray results which show a suprahilar infiltrate suspicious for a mass, with recommendation for further imaging with a CT scan - will call GI in the morning for recommendation of scan high resolution vs CT chest with contrast - labs if contrasted scan

## 2018-11-02 NOTE — Patient Instructions (Signed)
Go get chest X-ray  Continue your mucinex Start steroids Use inhaler frequently as needed for coughing, wheeze, shortness of breath  Try to switch up your antihistamine and start singulair in addition to your meds Can try new rx for nasal spray Saline nasal spray  I will call you tomorrow with Xray results - think we will still need an antibiotic if you continue to have worse pain and pressure in sinuses.

## 2018-11-03 ENCOUNTER — Other Ambulatory Visit: Payer: Self-pay | Admitting: Family Medicine

## 2018-11-03 DIAGNOSIS — R059 Cough, unspecified: Secondary | ICD-10-CM

## 2018-11-03 DIAGNOSIS — R9389 Abnormal findings on diagnostic imaging of other specified body structures: Secondary | ICD-10-CM

## 2018-11-03 DIAGNOSIS — R05 Cough: Secondary | ICD-10-CM

## 2018-11-03 NOTE — Addendum Note (Signed)
Addended by: Delsa Grana on: 11/03/2018 02:46 PM   Modules accepted: Orders

## 2018-11-03 NOTE — Progress Notes (Signed)
Abnormal findings on chest x-ray the result note has already been reviewed and patient called and notified  Called radiology to review CT recommendations, ordered accordingly, basic labs to check renal function and WBC.  Pt notified to come do labs few days prior to CT.  Problem List Items Addressed This Visit    None    Visit Diagnoses    Cough    -  Primary   Relevant Medications   predniSONE (DELTASONE) 20 MG tablet   Other Relevant Orders   DG Chest 2 View (Completed)   CBC with Differential/Platelet   COMPLETE METABOLIC PANEL WITH GFR   CT Chest W Contrast   Rhinosinusitis       chronic for the past 3-4 months, some allergic component, works outdoors plants/yards   Relevant Medications   predniSONE (DELTASONE) 20 MG tablet   montelukast (SINGULAIR) 10 MG tablet   azelastine (ASTELIN) 0.1 % nasal spray   levofloxacin (LEVAQUIN) 750 MG tablet   Abnormal finding on chest xray       Relevant Orders   CBC with Differential/Platelet   COMPLETE METABOLIC PANEL WITH GFR   CT Chest W Contrast

## 2018-11-04 ENCOUNTER — Other Ambulatory Visit: Payer: Managed Care, Other (non HMO)

## 2018-11-04 DIAGNOSIS — R059 Cough, unspecified: Secondary | ICD-10-CM

## 2018-11-04 DIAGNOSIS — R9389 Abnormal findings on diagnostic imaging of other specified body structures: Secondary | ICD-10-CM

## 2018-11-04 DIAGNOSIS — R05 Cough: Secondary | ICD-10-CM

## 2018-11-05 LAB — CBC WITH DIFFERENTIAL/PLATELET
Absolute Monocytes: 964 cells/uL — ABNORMAL HIGH (ref 200–950)
Basophils Absolute: 49 cells/uL (ref 0–200)
Basophils Relative: 0.4 %
Eosinophils Absolute: 98 cells/uL (ref 15–500)
Eosinophils Relative: 0.8 %
HCT: 42.6 % (ref 35.0–45.0)
Hemoglobin: 14.6 g/dL (ref 11.7–15.5)
Lymphs Abs: 3221 cells/uL (ref 850–3900)
MCH: 31.1 pg (ref 27.0–33.0)
MCHC: 34.3 g/dL (ref 32.0–36.0)
MCV: 90.6 fL (ref 80.0–100.0)
MPV: 10.2 fL (ref 7.5–12.5)
Monocytes Relative: 7.9 %
NEUTROS ABS: 7869 {cells}/uL — AB (ref 1500–7800)
Neutrophils Relative %: 64.5 %
Platelets: 314 10*3/uL (ref 140–400)
RBC: 4.7 10*6/uL (ref 3.80–5.10)
RDW: 12.6 % (ref 11.0–15.0)
Total Lymphocyte: 26.4 %
WBC: 12.2 10*3/uL — ABNORMAL HIGH (ref 3.8–10.8)

## 2018-11-05 LAB — COMPLETE METABOLIC PANEL WITH GFR
AG Ratio: 1.9 (calc) (ref 1.0–2.5)
ALT: 28 U/L (ref 6–29)
AST: 16 U/L (ref 10–35)
Albumin: 4.3 g/dL (ref 3.6–5.1)
Alkaline phosphatase (APISO): 61 U/L (ref 33–130)
BUN: 13 mg/dL (ref 7–25)
CO2: 25 mmol/L (ref 20–32)
Calcium: 9.8 mg/dL (ref 8.6–10.4)
Chloride: 106 mmol/L (ref 98–110)
Creat: 0.84 mg/dL (ref 0.50–1.05)
GFR, Est African American: 89 mL/min/{1.73_m2} (ref >=60)
GFR, Est Non African American: 77 mL/min/{1.73_m2} (ref >=60)
Globulin: 2.3 g/dL (calc) (ref 1.9–3.7)
Glucose, Bld: 104 mg/dL — ABNORMAL HIGH (ref 65–99)
Potassium: 3.9 mmol/L (ref 3.5–5.3)
Sodium: 141 mmol/L (ref 135–146)
TOTAL PROTEIN: 6.6 g/dL (ref 6.1–8.1)
Total Bilirubin: 1 mg/dL (ref 0.2–1.2)

## 2018-11-11 ENCOUNTER — Ambulatory Visit
Admission: RE | Admit: 2018-11-11 | Discharge: 2018-11-11 | Disposition: A | Discharge status: Home / Self Care | Payer: Managed Care, Other (non HMO) | Source: Ambulatory Visit | Attending: Family Medicine | Admitting: Family Medicine

## 2018-11-11 IMAGING — CT CT CHEST W/ CM
2 of 4 series · 12 of 36 positions shown, 15 images · IV contrast (iopamidol)
Comparison: [DATE]

CLINICAL DATA: Cough, shortness of breath

EXAM:
CT CHEST WITH CONTRAST
TECHNIQUE: Multidetector CT imaging of the chest was performed during
intravenous contrast administration.
CONTRAST:  75mL [4C] IOPAMIDOL ([4C]) INJECTION 61%

[Series 2: chest 2.00 br40 s3 ax · axial · 0.64mm/px · z∈[+1625,+1909]mm · 9 of 168 slices shown, 12 images]
[im 13/168  mediastinal]
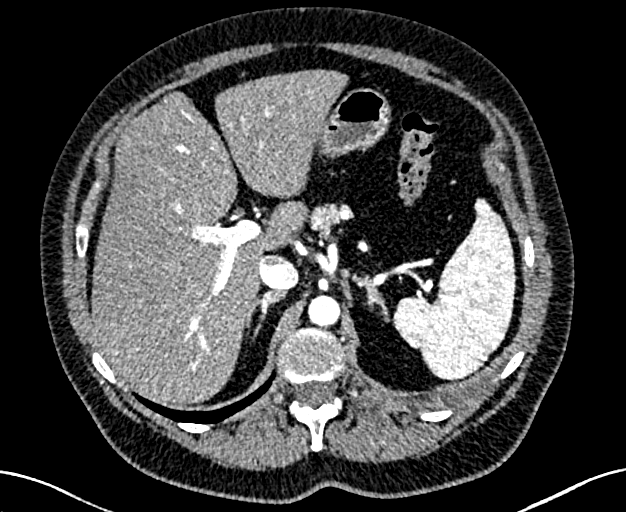
[im 13/168  lung]
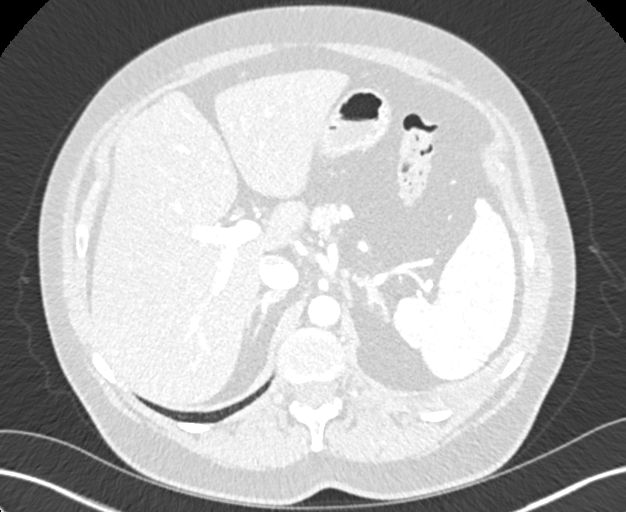
[im 39/168  lung]
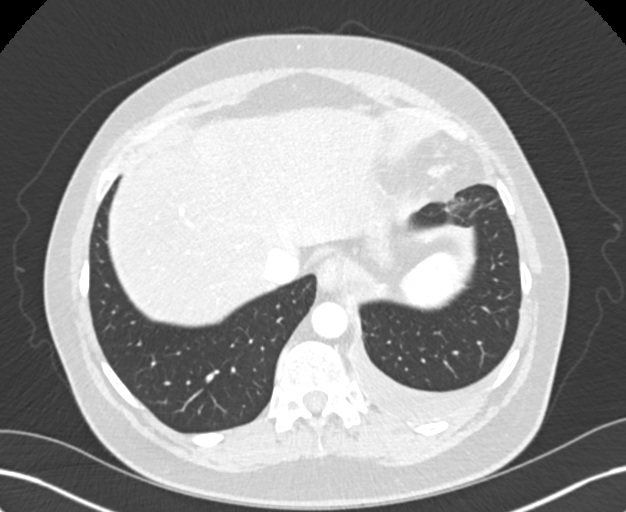
[im 52/168  lung]
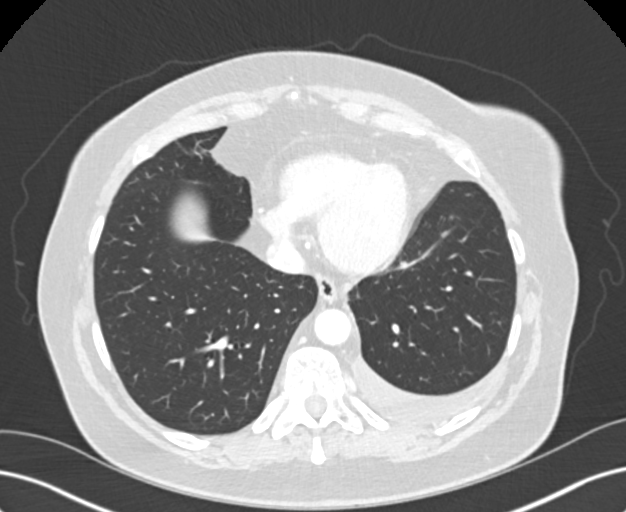
[im 65/168  lung]
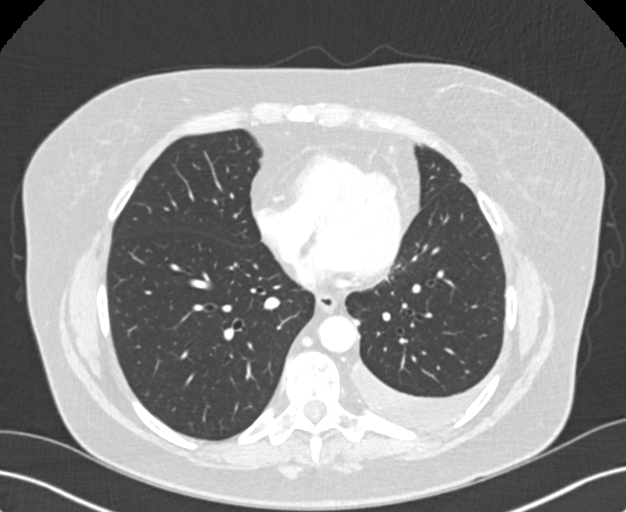
[im 90/168  mediastinal]
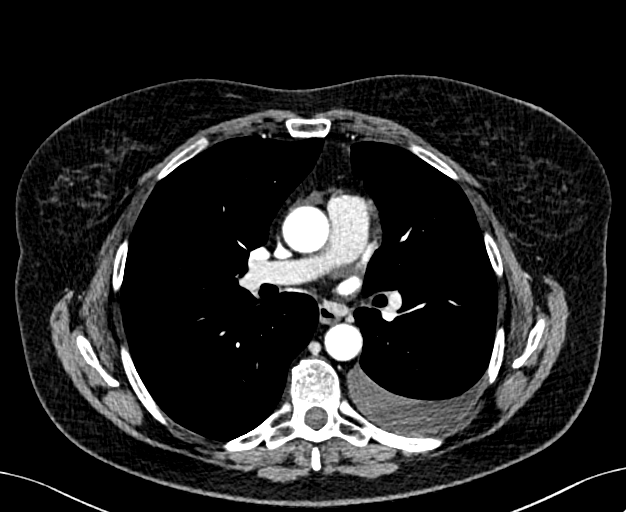
[im 90/168  lung]
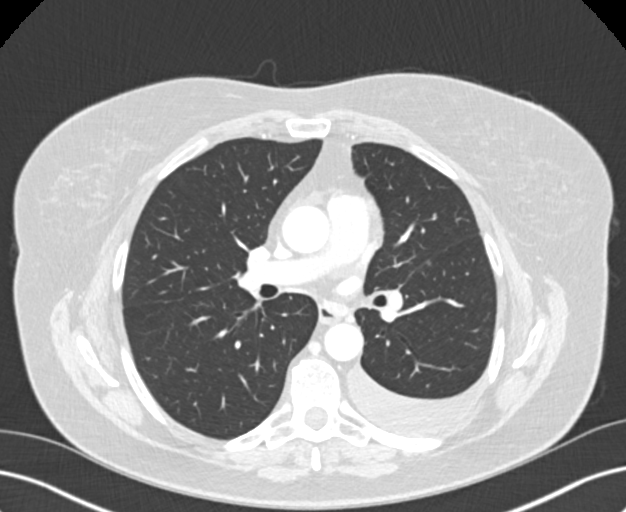
[im 103/168  lung]
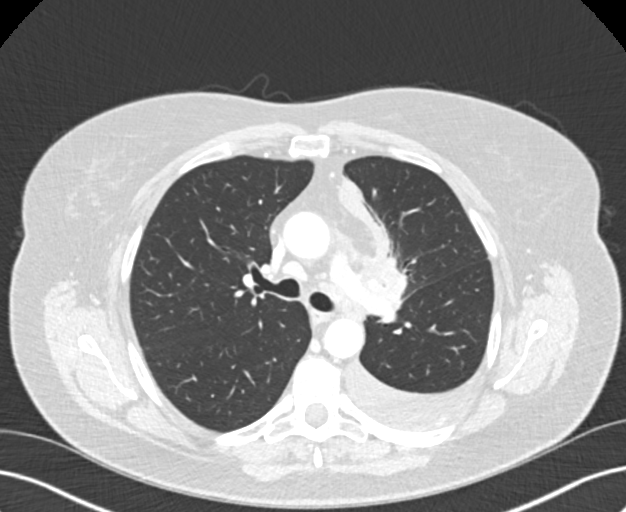
[im 116/168  lung]
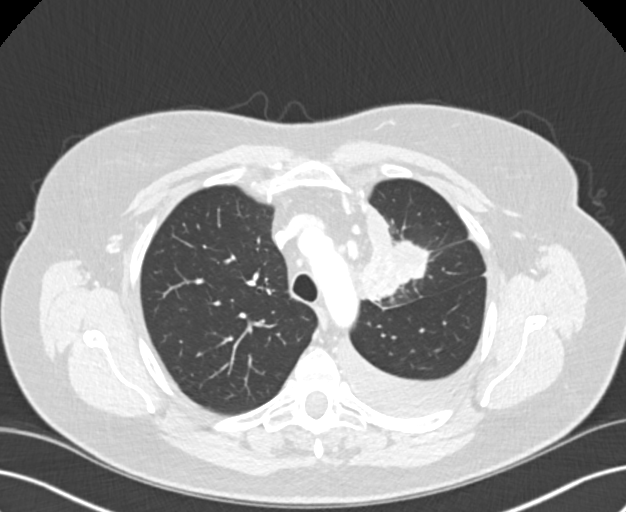
[im 142/168  lung]
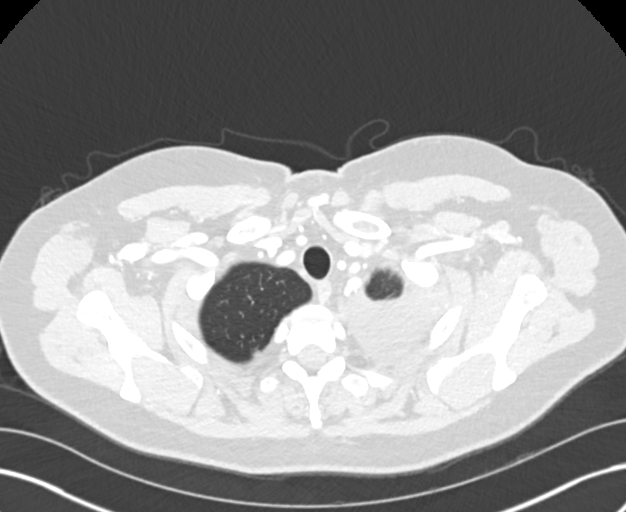
[im 155/168  mediastinal]
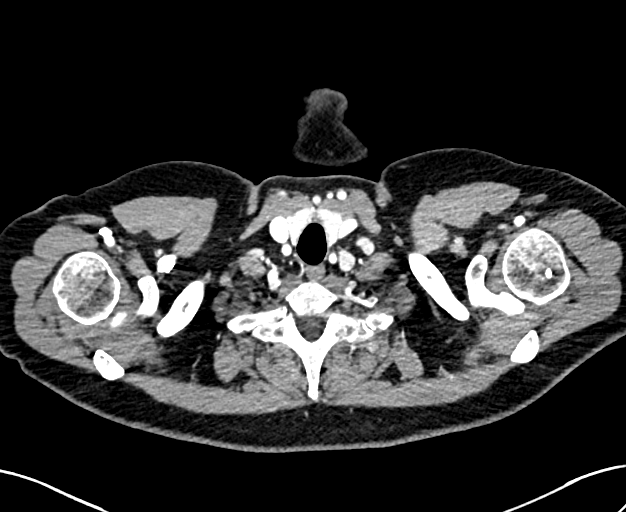
[im 155/168  lung]
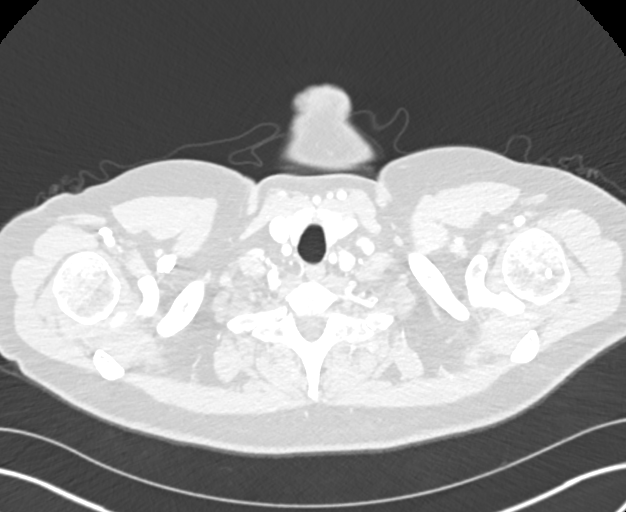

[Series 4: chest 2.00 br40 s3 cor · coronal · 0.66mm/px · 3 of 163 slices shown]
[im 33/163  lung]
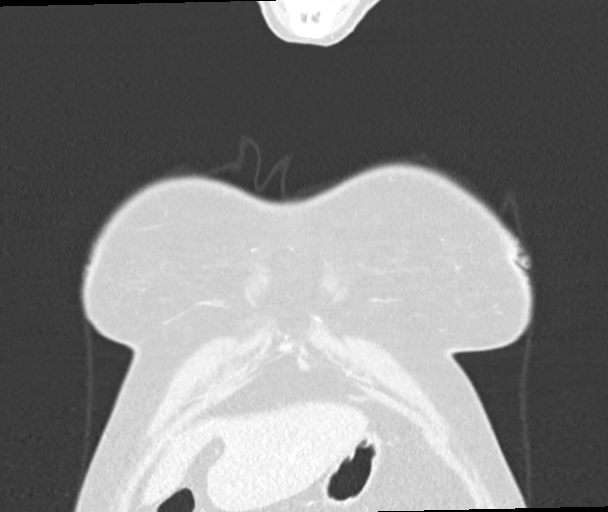
[im 65/163  lung]
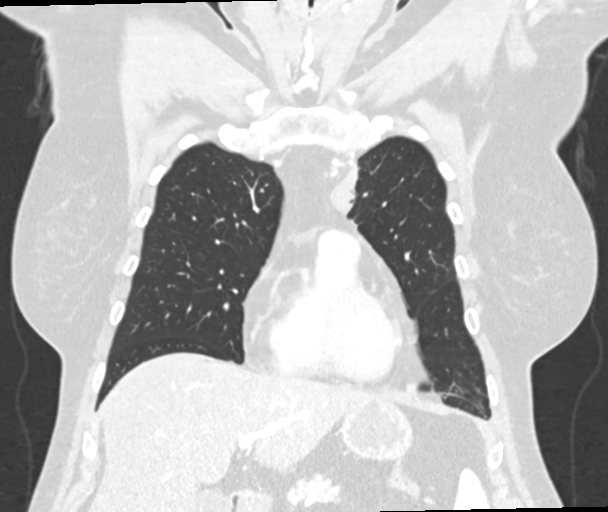
[im 98/163  lung]
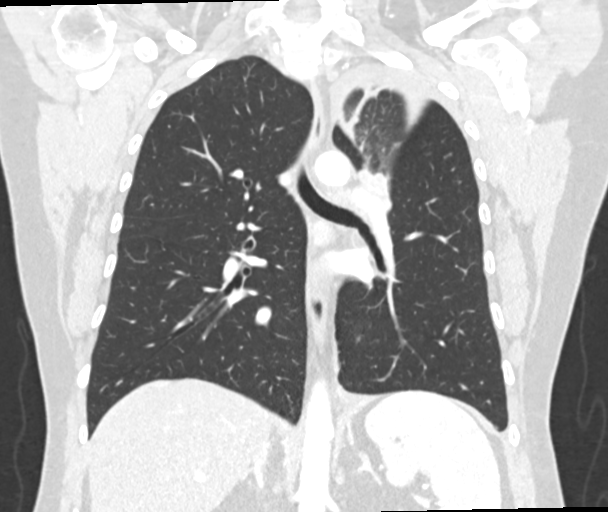

[12 of 36 positions shown; findings below may reference images not displayed]

FINDINGS: Cardiovascular: Heart is normal size. Scattered coronary artery
calcifications. Aorta is normal caliber.

Mediastinum/Nodes: AP window adenopathy and prevascular adenopathy.
Upper prevascular lymph node has a short axis diameter of 10 mm.
Numerous similarly sized or smaller prevascular lymph nodes. Mildly
enlarged low right paratracheal lymph node has a short axis diameter
of 9 mm. AP window nodal mass has a short axis diameter of 9 mm. No
axillary adenopathy.

Lungs/Pleura: There is an irregular infiltrating mass noted in the
medial left upper lobe which abuts and likely extends into the
mediastinum. There is adjacent postobstructive atelectasis or
pneumonia in the left upper lobe. Due to the irregular nature and
the adjacent postobstructive process, is difficult to accurately
measure, but mass measures approximately 4.2 x 3.2 cm on image 60 of
series 2. This extends to the superior left hilum. Occlusion of the
apical branch of the left upper lobe bronchus. There is a moderate
pleural effusion. There is nodularity in the pleural space
concerning fora pleural spread of tumor small nodules along the
major fissure also compatible with pleural involvement.

Right lung clear.

Upper Abdomen: Diffuse fatty infiltration of the liver. No adrenal
mass. No acute findings in the upper abdomen.

Musculoskeletal: Chest wall soft tissues are unremarkable. No acute
bony abnormality or focal bone lesion.
IMPRESSION: Irregular infiltrating mass in the medial left upper lobe abutting
the left hilum and likely extending/invading the mediastinum.
Postobstructive atelectasis or pneumonia in the left upper lobe
makes it difficult to accurately measure the mass, but approximately
4.2 x 3.2 cm.

Moderate left pleural effusion. There is pleural nodularity noted
compatible with pleural spread.

Mediastinal adenopathy in the prevascular space and AP window.
Borderline sized low right paratracheal lymph node.

Fatty infiltration of the liver.

These results will be called to the ordering clinician or
representative by the Radiologist Assistant, and communication
documented in the PACS or zVision Dashboard.

## 2018-11-11 MED ORDER — IOPAMIDOL (ISOVUE-300) INJECTION 61%
75.0000 mL | Freq: Once | INTRAVENOUS | Status: AC | PRN
  Administered 2018-11-11: 75 mL via INTRAVENOUS

## 2018-11-15 ENCOUNTER — Ambulatory Visit: Payer: Managed Care, Other (non HMO) | Admitting: Family Medicine

## 2018-11-15 ENCOUNTER — Encounter: Payer: Self-pay | Admitting: Family Medicine

## 2018-11-15 VITALS — BP 126/80 | HR 100 | Temp 98.6°F | Resp 16 | Ht 67.5 in | Wt 191.0 lb

## 2018-11-15 DIAGNOSIS — R918 Other nonspecific abnormal finding of lung field: Secondary | ICD-10-CM

## 2018-11-15 DIAGNOSIS — J329 Chronic sinusitis, unspecified: Secondary | ICD-10-CM | POA: Diagnosis not present

## 2018-11-15 MED ORDER — PREDNISONE 20 MG PO TABS: ORAL_TABLET | ORAL | 0 refills | Status: DC

## 2018-11-15 NOTE — Progress Notes (Signed)
Patient ID: Tina Cruz, female    DOB: 1961/08/20, 58 y.o.   MRN: [id number]  PCP: [name], MD  Chief Complaint  Patient presents with  . Results    Patient in today to discuss.   . Sinusitis    Has c/o sinus headache, couugh    Subjective:   Tina Cruz is a 58 y.o. female, presents to clinic with CC of follow up for cough, dyspnea, abnormal CXR and abnormal follow up CT chest - resulted last Friday night - she was called this morning asked to go over results in clinic today. Her cough and wheeze has improved a little bit.  She is still using her inhaler and it is still helping.  She has developed left lower ribs, back and side pain with deep inspiration and when laying down at night - especially last night, pain was more severe, she couldn't sleep, pain was worse when laying on her left side.   No weight loss.  No hemoptysis. Pt has taken zpak, augmentin and levaquin in the past 2 months, still have some left sinus pain and pressure.     Patient Active Problem List   Diagnosis Date Noted  . Allergic rhinitis 07/27/2018  . Hemangioma 03/11/2017  . Seborrheic keratosis 03/11/2017  . Skin tag 03/11/2017  . GERD (gastroesophageal reflux disease) 01/14/2015  . Thyroid disease      Prior to Admission medications   Medication Sig Start Date End Date Taking? Authorizing Provider  azelastine (ASTELIN) 0.1 % nasal spray Place 1 spray into both nostrils 2 (two) times daily as needed for rhinitis. Use in each nostril as directed 11/02/18  Yes Delsa Grana, PA-C  Cetirizine HCl (ZYRTEC ALLERGY) 10 MG CAPS Take 1 capsule (10 mg total) by mouth daily as needed (allergies). 07/27/18  Yes Dena Billet B, PA-C  cyclobenzaprine (FLEXERIL) 10 MG tablet Take 1 tablet (10 mg total) by mouth 3 (three) times daily as needed for muscle spasms. 05/24/17  Yes Dena Billet B, PA-C  Ibuprofen (ADVIL PO) Take 1 tablet by mouth as needed (220mg  OTC).   Yes [provider]    levalbuterol (XOPENEX HFA) 45 MCG/ACT inhaler Inhale 1-2 puffs into the lungs every 4 (four) hours as needed for wheezing or shortness of breath. 09/27/18  Yes Delsa Grana, PA-C  levothyroxine (SYNTHROID, LEVOTHROID) 112 MCG tablet TAKE 1 TABLET (112 MCG TOTAL) BY MOUTH DAILY BEFORE BREAKFAST. 08/03/18  Yes Susy Frizzle, MD  montelukast (SINGULAIR) 10 MG tablet Take 1 tablet (10 mg total) by mouth at bedtime. 11/02/18  Yes Delsa Grana, PA-C  ranitidine (ZANTAC) 150 MG tablet Take 150 mg by mouth as needed for heartburn (not RX pt buys OTC).   Yes [provider]     Allergies  Allergen Reactions  . Albuterol Hives and Itching     Family History  Problem Relation Age of Onset  . Cancer Mother        colon  . Alcohol abuse Father      Social History   Socioeconomic History  . Marital status: Married    Spouse name: Not on file  . Number of children: Not on file  . Years of education: Not on file  . Highest education level: Not on file  Occupational History  . Not on file  Social Needs  . Financial resource strain: Not on file  . Food insecurity:    Worry: Not on file    Inability: Not  on file  . Transportation needs:    Medical: Not on file    Non-medical: Not on file  Tobacco Use  . Smoking status: Former Smoker    Packs/day: 0.50    Types: Cigarettes    Last attempt to quit: 06/03/2015    Years since quitting: 3.4  . Smokeless tobacco: Never Used  Substance and Sexual Activity  . Alcohol use: No    Alcohol/week: 0.0 standard drinks  . Drug use: No  . Sexual activity: Not on file  Lifestyle  . Physical activity:    Days per week: Not on file    Minutes per session: Not on file  . Stress: Not on file  Relationships  . Social connections:    Talks on phone: Not on file    Gets together: Not on file    Attends religious service: Not on file    Active member of club or organization: Not on file    Attends meetings of clubs or organizations: Not  on file    Relationship status: Not on file  . Intimate partner violence:    Fear of current or ex partner: Not on file    Emotionally abused: Not on file    Physically abused: Not on file    Forced sexual activity: Not on file  Other Topics Concern  . Not on file  Social History Narrative  . Not on file     Review of Systems     Objective:    Vitals:   11/15/18 1115  BP: 126/80  Pulse: 100  Resp: 16  Temp: 98.6 F (37 C)  TempSrc: Oral  SpO2: 97%  Weight: 191 lb (86.6 kg)  Height: 5' 7.5" (1.715 m)    HR 96  Physical Exam Vitals signs (Hr recheck at time of exam - 96) and nursing note reviewed.  Constitutional:      General: She is not in acute distress.    Appearance: Normal appearance. She is well-developed. She is obese. She is not ill-appearing, toxic-appearing or diaphoretic.  HENT:     Head: Normocephalic and atraumatic.     Right Ear: External ear normal.     Left Ear: External ear normal.     Nose: Mucosal edema and congestion present.     Left Sinus: Maxillary sinus tenderness and frontal sinus tenderness present.     Mouth/Throat:     Lips: Pink.     Mouth: Mucous membranes are moist.     Pharynx: Oropharynx is clear. Uvula midline.  Eyes:     General: Lids are normal.     Conjunctiva/sclera: Conjunctivae normal.     Pupils: Pupils are equal, round, and reactive to light.  Neck:     Musculoskeletal: Normal range of motion and neck supple.     Vascular: No JVD.     Trachea: Trachea and phonation normal. No tracheal deviation.  Cardiovascular:     Rate and Rhythm: Normal rate and regular rhythm.  No extrasystoles are present.    Chest Wall: PMI is not displaced.     Pulses: Normal pulses.          Radial pulses are 2+ on the right side and 2+ on the left side.     Heart sounds: Normal heart sounds. No murmur. No friction rub. No gallop.   Pulmonary:     Effort: Pulmonary effort is normal. No tachypnea, accessory muscle usage, prolonged  expiration, respiratory distress or retractions.     Breath  sounds: Normal breath sounds. No stridor, decreased air movement or transmitted upper airway sounds. No decreased breath sounds, wheezing, rhonchi or rales.  Chest:     Chest wall: No tenderness.  Abdominal:     General: Bowel sounds are normal. There is no distension.     Palpations: Abdomen is soft.     Tenderness: There is no abdominal tenderness. There is no guarding or rebound.  Musculoskeletal: Normal range of motion.        General: No deformity.     Right lower leg: No edema.     Left lower leg: No edema.  Lymphadenopathy:     Cervical: No cervical adenopathy.  Skin:    General: Skin is warm and dry.     Capillary Refill: Capillary refill takes less than 2 seconds.     Coloration: Skin is not cyanotic, mottled or pale.     Findings: No rash.     Nails: There is no clubbing.   Neurological:     Mental Status: She is alert and oriented to person, place, and time.     Motor: No abnormal muscle tone.     Gait: Gait normal.  Psychiatric:        Speech: Speech normal.        Behavior: Behavior normal.           Assessment & Plan:      ICD-10-CM   1. Mass of left lung R91.8   2. Mass of upper lobe of left lung R91.8 Ambulatory referral to Cardiothoracic Surgery  3. Rhinosinusitis J32.9 Ambulatory referral to ENT     Follow up today for CT results - finding of left upper lobe lung mass -although she has had antibiotics 3 times a CT was obtained about 5 days after she finished Levaquin she noted clinical improvement however the CT does show some possible pneumonia remaining in the left upper lobe there is some atelectasis, mass is invading mediastinum and also appears to be spreading along major fissures and there is moderate left pleural effusion.  Patient recently began to have left chest pain located along her lower ribs wrapping all the way around worse with deep inspiration and with laying on her left side to  do believe is secondary to the pleural effusion.  There is no appreciable dullness rales to the left lower lobe.  No wheeze and there was actually good breath sounds in all lung fields bilaterally anteriorly and posteriorly.  Patient was mildly tachycardic but that improved when she was resting in the room she was very anxious during the appointment.   She appeared in good health without respiratory distress when at rest, no hypoxia.  I had printed out CT results and reviewed report and images with her in the exam room.  I discussed the plan with her/ process to get dx -  First refer urgently to CT surgery or pulmonology to get a biopsy of the mass to get a definitive dx.  I did discuss concern that if may be malignancy - and if it was she would get referred to oncology and pathology dx would guide tx and prognosis.     I did discuss with her the appearance of remaining pneumonia -she did improve with the last prescription of Levaquin and with steroids and using her inhalers -at least the coughing had improved her exertional shortness of breath is only minimally better than last office visit.  Do believe the pleural effusion she is starting to feel  and although I did not appreciate it being significantly large I could hear good breath sounds bilaterally at the bases -discussed with her thoracentesis, symptoms of acutely worsening pleural effusion.  Explained how it can be from infection, COPD or for malignancy -we discussed signs and symptoms of worsening pleural effusion and she verbalized understanding of ER precautions.    Encouraged her to continue to use her inhalers she was given another short taper of steroids to see if it helps with the pleuritic chest pain.    Did discuss the case today with Dr. Buelah Manis, the patient's PCP, also discussed with the referral coordinator today to urgently get her referred to cardiothoracic surgery.  Delsa Grana, PA-C 11/15/18 11:34 AM

## 2018-11-24 ENCOUNTER — Encounter: Payer: Managed Care, Other (non HMO) | Admitting: Cardiothoracic Surgery

## 2018-11-24 ENCOUNTER — Telehealth: Payer: Self-pay | Admitting: Family Medicine

## 2018-11-24 NOTE — Telephone Encounter (Signed)
Spoke with patient and informed her of recommendations as listed below. Patient verbalized understanding and stated that it is not that bad she just wanted to have another round of prednisone until she could be seen. I still urged patient to go to ER or Urgent care. Patient verbalized understanding.

## 2018-11-24 NOTE — Telephone Encounter (Signed)
She would need to get checked at [place] somewhere.  It could be infection, more fluid accumulating or something else.  I would recommend going somewhere that can get a CXR fast in case they need it because of whats going on with her.  If any pain is severe or if she has SOB, weakness, near syncope- she may unfortunately need to go to the ER.

## 2018-11-24 NOTE — Telephone Encounter (Signed)
Patient called in stating that she is having worsening lung pain that is keeping her up at night. She was due to see the Cardiothoracic surgeon today Dr. Kerby Less and the appointment was rescheduled due to provider not being in office today and she will not see him now until the 20th. She would like to know if there is anything you can call in for the pain. Another round of prednisone, or etc. Please advise?

## 2018-12-01 ENCOUNTER — Institutional Professional Consult (permissible substitution): Payer: Managed Care, Other (non HMO) | Admitting: Cardiothoracic Surgery

## 2018-12-01 ENCOUNTER — Other Ambulatory Visit: Payer: Self-pay | Admitting: *Deleted

## 2018-12-01 ENCOUNTER — Encounter: Payer: Self-pay | Admitting: Cardiothoracic Surgery

## 2018-12-01 VITALS — BP 138/80 | HR 96 | Resp 20 | Ht 67.5 in | Wt 187.0 lb

## 2018-12-01 DIAGNOSIS — J90 Pleural effusion, not elsewhere classified: Secondary | ICD-10-CM

## 2018-12-01 DIAGNOSIS — R918 Other nonspecific abnormal finding of lung field: Secondary | ICD-10-CM | POA: Diagnosis not present

## 2018-12-01 DIAGNOSIS — Z01818 Encounter for other preprocedural examination: Secondary | ICD-10-CM

## 2018-12-01 DIAGNOSIS — R911 Solitary pulmonary nodule: Secondary | ICD-10-CM

## 2018-12-01 NOTE — Progress Notes (Signed)
PCP is Buelah Manis, Modena Nunnery, MD Referring Provider is Delsa Grana, PA-C  Chief Complaint  Patient presents with  . Lung Mass    Surgical eval, Chest CT 11/11/2018  Patient examined, images of chest CT scan personally reviewed with patient  HPI: 58 year old female who stopped smoking 10 years ago presents with symptoms of cough chest discomfort pleuritic pain fatigue and weight loss since mid January.  Chest x-ray was abnormal and a follow-up CT scan shows a 4-5 cm left upper lobe mass with extension into the AP window, narrowing of left upper lobe bronchus And involvement of the left pulmonary artery.  There is a moderate left pleural effusion and nodularity of the parietal pleura.  She denies bone pain, headache change in vision or other neurologic deficit.  Her primary care physician has treated her with antibiotics and prednisone Past Medical History:  Diagnosis Date  . Active smoker   . Depression   . H/O chronic bronchitis   . Thyroid disease    hypothyroid    Past Surgical History:  Procedure Laterality Date  . TONSILLECTOMY    . TUBAL LIGATION      Family History  Problem Relation Age of Onset  . Cancer Mother        colon  . Alcohol abuse Father     Social History Social History   Tobacco Use  . Smoking status: Former Smoker    Packs/day: 0.50    Types: Cigarettes    Last attempt to quit: 06/03/2015    Years since quitting: 3.4  . Smokeless tobacco: Never Used  Substance Use Topics  . Alcohol use: No    Alcohol/week: 0.0 standard drinks  . Drug use: No    Current Outpatient Medications  Medication Sig Dispense Refill  . azelastine (ASTELIN) 0.1 % nasal spray Place 1 spray into both nostrils 2 (two) times daily as needed for rhinitis. Use in each nostril as directed 30 mL 12  . Cetirizine HCl (ZYRTEC ALLERGY) 10 MG CAPS Take 1 capsule (10 mg total) by mouth daily as needed (allergies). 30 capsule 11  . cyclobenzaprine (FLEXERIL) 10 MG tablet Take 1 tablet  (10 mg total) by mouth 3 (three) times daily as needed for muscle spasms. 30 tablet 0  . famotidine (PEPCID) 20 MG tablet Take 20 mg by mouth daily.    . Ibuprofen (ADVIL PO) Take 1 tablet by mouth as needed (220mg  OTC).    Marland Kitchen levalbuterol (XOPENEX HFA) 45 MCG/ACT inhaler Inhale 1-2 puffs into the lungs every 4 (four) hours as needed for wheezing or shortness of breath. 1 Inhaler 0  . levothyroxine (SYNTHROID, LEVOTHROID) 112 MCG tablet TAKE 1 TABLET (112 MCG TOTAL) BY MOUTH DAILY BEFORE BREAKFAST. 90 tablet 1  . montelukast (SINGULAIR) 10 MG tablet Take 1 tablet (10 mg total) by mouth at bedtime. 30 tablet 3  . predniSONE (DELTASONE) 20 MG tablet 2 tabs poqday 1-3, 1 tabs poqday 4-6 9 tablet 0   No current facility-administered medications for this visit.     Allergies  Allergen Reactions  . Albuterol Hives and Itching    Review of Systems  Patient is retired from working at [employer] No fever Received an influenza vaccine this fall Right-hand-dominant No previous thoracic surgery or injuries No history of cardiac disease arrhythmia or heart failure No abdominal pain jaundice or blood per rectum No edema or neuropathy No active dental complaints or difficulty swallowing   BP 138/80   Pulse 96   Resp  20   Ht 5' 7.5" (1.715 m)   Wt 187 lb (84.8 kg)   SpO2 95% Comment: RA  BMI 28.86 kg/m  Physical Exam     Physical Exam  General: Middle-aged overweight white female no acute distress, anxious HEENT: Normocephalic pupils equal , dentition adequate Neck: Supple without JVD, adenopathy, or bruit Chest: Diminished breath sounds on left with tubular breath sounds posteriorly, no tenderness  or deformity Cardiovascular: Regular rate and rhythm, no murmur, no gallop, peripheral pulses             palpable in all extremities Abdomen:  Soft, nontender, no palpable mass or organomegaly Extremities: Warm, well-perfused, no clubbing cyanosis edema or tenderness,               no venous stasis changes of the legs Rectal/GU: Deferred Neuro: Grossly non--focal and symmetrical throughout Skin: Clean and dry without rash or ulceration   Diagnostic Tests: CT images personally reviewed showing the left upper lobe mass with extension into the mediastinum, pleural effusion, narrowing of the upper lobe bronchus and invasion of the left main pulmonary artery  Impression: Probable advanced age lung cancer  Plan: Patient needs clinical staging and tissue diagnosis. Will obtain PET scan and brain MRI to finish staging. Will arrange for left thoracentesis and cytology of pleural fluid. If nondiagnostic or more tissue is needed then probable bronchoscopy and biopsy of left upper lobe mass would be the next step.  Patient return after thoracentesis and PET scan   Len Childs, MD [facility] [phone]

## 2018-12-06 ENCOUNTER — Ambulatory Visit (HOSPITAL_COMMUNITY)
Admission: RE | Admit: 2018-12-06 | Discharge: 2018-12-06 | Disposition: A | Discharge status: Home / Self Care | Payer: Managed Care, Other (non HMO) | Source: Ambulatory Visit | Attending: Physician Assistant | Admitting: Physician Assistant

## 2018-12-06 ENCOUNTER — Ambulatory Visit (HOSPITAL_COMMUNITY)
Admission: RE | Admit: 2018-12-06 | Discharge: 2018-12-06 | Disposition: A | Discharge status: Home / Self Care | Payer: Managed Care, Other (non HMO) | Source: Ambulatory Visit | Attending: Cardiothoracic Surgery | Admitting: Cardiothoracic Surgery

## 2018-12-06 DIAGNOSIS — R911 Solitary pulmonary nodule: Secondary | ICD-10-CM | POA: Diagnosis not present

## 2018-12-06 DIAGNOSIS — Z01818 Encounter for other preprocedural examination: Secondary | ICD-10-CM | POA: Diagnosis not present

## 2018-12-06 DIAGNOSIS — J90 Pleural effusion, not elsewhere classified: Secondary | ICD-10-CM | POA: Insufficient documentation

## 2018-12-06 IMAGING — CR DG CHEST 1V
1 series · 1 of 1 positions shown · non-contrast
Comparison: Chest radiograph [DATE]

CLINICAL DATA: Post left thoracentesis.

EXAM:
CHEST  1 VIEW

[w chest pa]
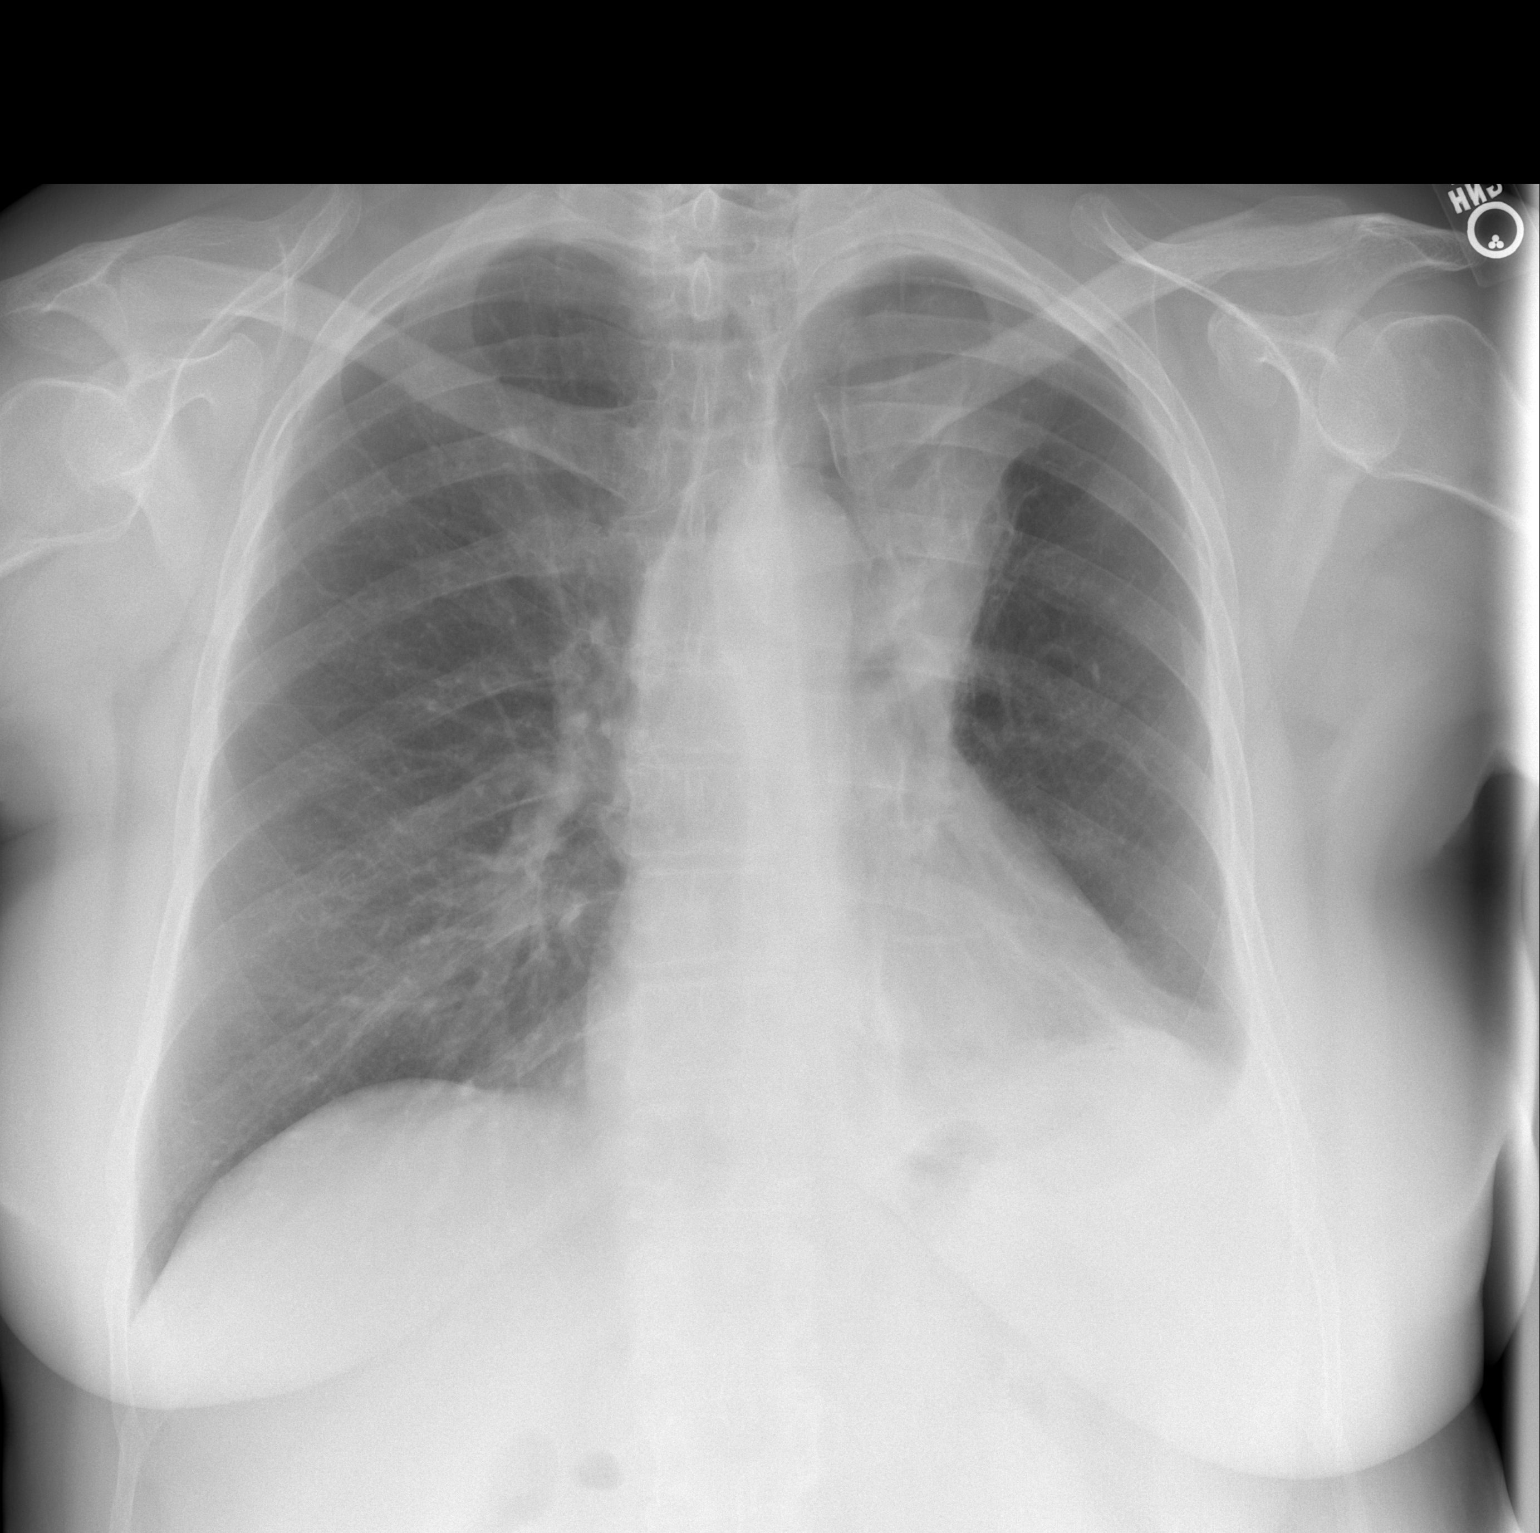

[1 of 1 positions shown; findings below may reference images not displayed]

FINDINGS: Aeration in left lung has minimally changed following the
thoracentesis. Persistent opacity and volume loss in the left upper
lobe compatible with the known lesion. Negative for a pneumothorax.
Right lung is clear. Cardiomediastinal silhouette is unchanged.
Stable blunting at the left costophrenic angle.
IMPRESSION: 1. Negative for pneumothorax following the left thoracentesis.
2. Aeration in the left lung has not significantly changed.
Persistent opacity and volume loss in the left upper lobe.

## 2018-12-06 IMAGING — US US THORACENTESIS ASP PLEURAL SPACE W/IMG GUIDE
1 series · 4 of 4 positions shown · non-contrast
Comparison: none

INDICATION: Left upper lobe mass with extension into the mediastinum. Pleural
effusion. Request for diagnostic and therapeutic thoracentesis.

[Series 1: us thoracentesis asp pleural space w/img guide · 4 of 4 slices shown]
[im 1/4]
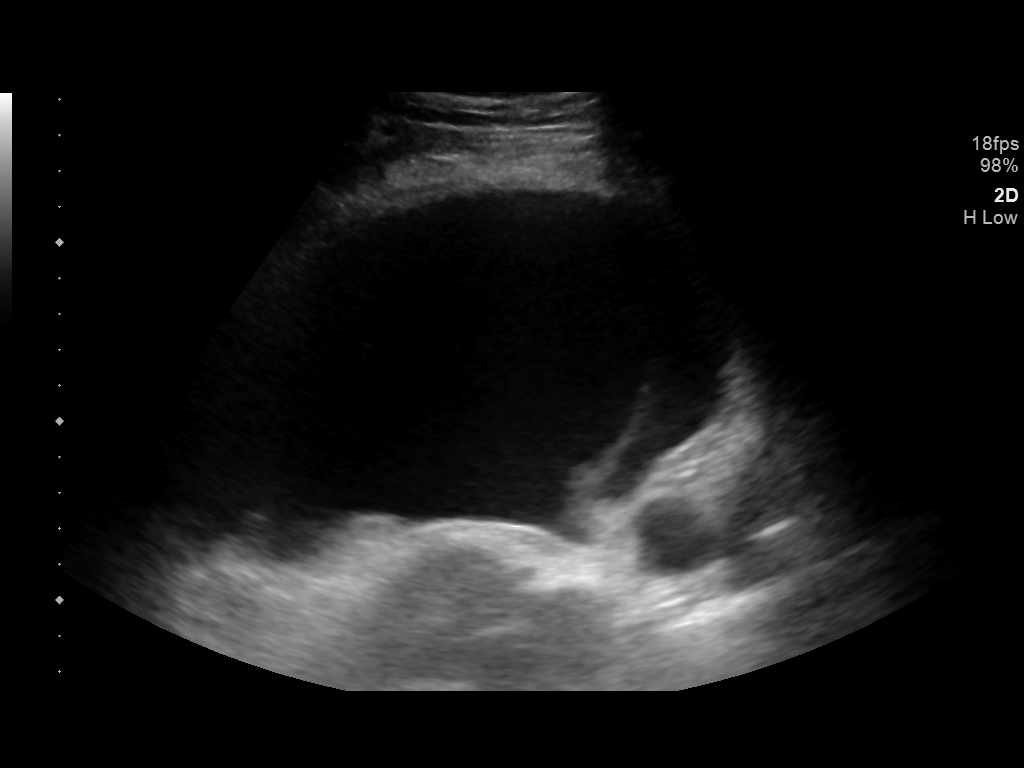
[im 2/4]
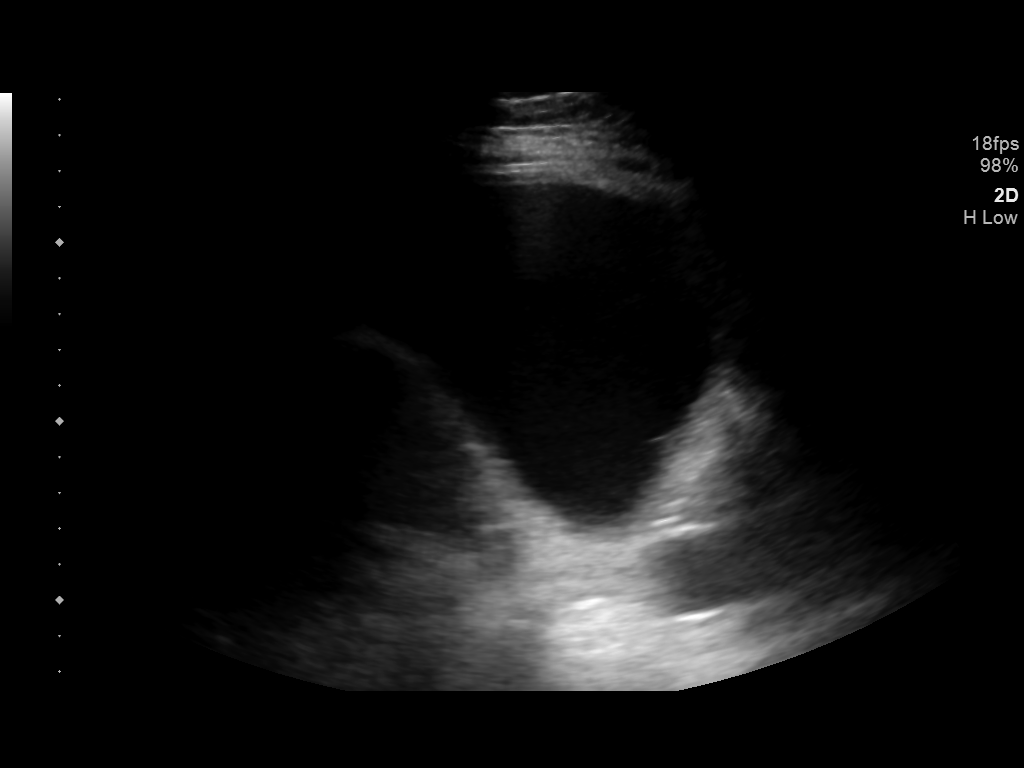
[im 3/4]
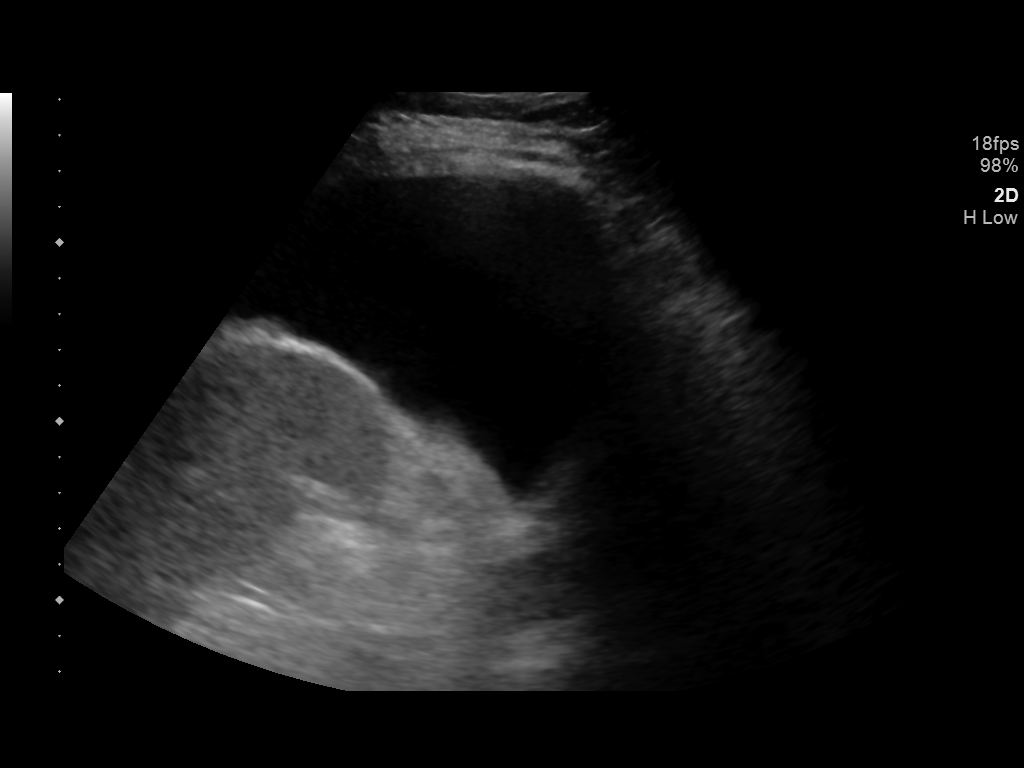
[im 4/4]
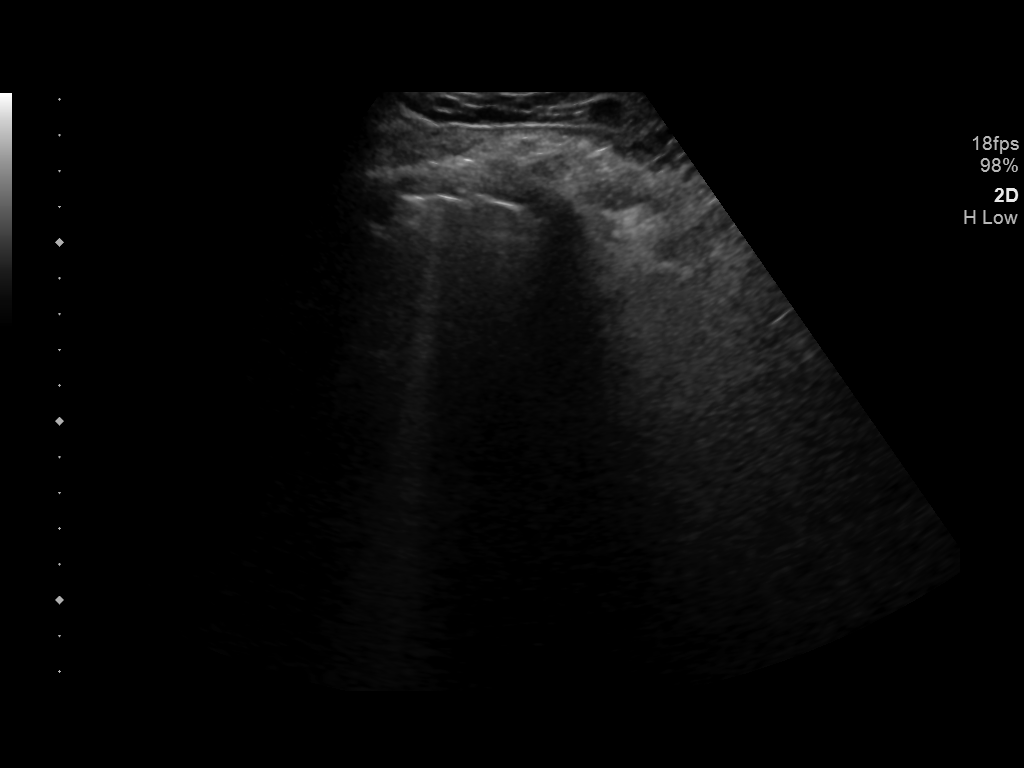

[4 of 4 positions shown; findings below may reference images not displayed]

EXAM:
ULTRASOUND GUIDED LEFT THORACENTESIS

MEDICATIONS:
1% lidocaine 10 mL

COMPLICATIONS:
None immediate.

PROCEDURE:
An ultrasound guided thoracentesis was thoroughly discussed with the
patient and questions answered. The benefits, risks, alternatives
and complications were also discussed. The patient understands and
wishes to proceed with the procedure. Written consent was obtained.

Ultrasound was performed to localize and mark an adequate pocket of
fluid in the left chest. The area was then prepped and draped in the
normal sterile fashion. 1% Lidocaine was used for local anesthesia.
Under ultrasound guidance a 6 Fr Safe-T-Centesis catheter was
introduced. Thoracentesis was performed. The catheter was removed
and a dressing applied.
FINDINGS: A total of approximately 1.5 L of hazy yellow fluid was removed.
Samples were sent to the laboratory as requested by the clinical
team.
IMPRESSION: Successful ultrasound guided left thoracentesis yielding 1.5 L of
pleural fluid.

No pneumothorax on post-procedure chest x-ray.

## 2018-12-06 MED ORDER — LIDOCAINE HCL 1 % IJ SOLN
INTRAMUSCULAR | Status: AC
  Filled 2018-12-06: qty 20

## 2018-12-06 NOTE — Procedures (Signed)
PROCEDURE SUMMARY:  Successful US guided left thoracentesis. Yielded 1.5 L of hazy yellow fluid. Patient tolerated procedure well. No immediate complications. EBL = trace  Specimen was sent for labs.  Post procedure chest X-ray reveals no pneumothorax  Toneshia Coello S Adelynne Joerger PA-C 12/06/2018 1:16 PM

## 2018-12-07 ENCOUNTER — Encounter (HOSPITAL_COMMUNITY): Payer: Self-pay | Admitting: Radiology

## 2018-12-07 ENCOUNTER — Ambulatory Visit (HOSPITAL_COMMUNITY)
Admission: RE | Admit: 2018-12-07 | Discharge: 2018-12-07 | Disposition: A | Discharge status: Home / Self Care | Payer: Managed Care, Other (non HMO) | Source: Ambulatory Visit | Attending: Cardiothoracic Surgery | Admitting: Cardiothoracic Surgery

## 2018-12-07 DIAGNOSIS — Z01818 Encounter for other preprocedural examination: Secondary | ICD-10-CM | POA: Diagnosis present

## 2018-12-07 DIAGNOSIS — R911 Solitary pulmonary nodule: Secondary | ICD-10-CM

## 2018-12-07 IMAGING — MR MR HEAD WO/W CM
10 of 13 series · 35 of 48 positions shown · IV contrast (Yes)
Comparison: None.

CLINICAL DATA: Solitary pulmonary nodule.  Staging lung cancer

EXAM:
MRI HEAD WITHOUT AND WITH CONTRAST
TECHNIQUE: Multiplanar, multiecho pulse sequences of the brain and surrounding
structures were obtained without and with intravenous contrast.
CONTRAST:  8 mL Gadovist IV

[Series 3: DWI · axial · 3.0mm · 1.09mm/px · z∈[-36,+125]mm · 8 of 110 slices shown (1 of 4)]
[im 1/110]
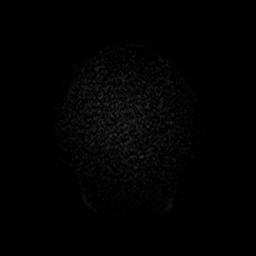
[im 13/110]
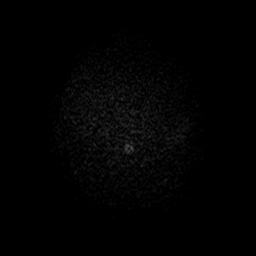
[im 37/110]
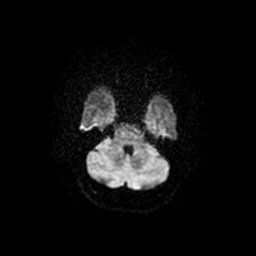
[im 49/110]
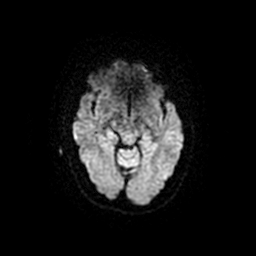
[im 61/110]
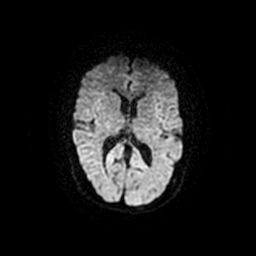
[im 73/110]
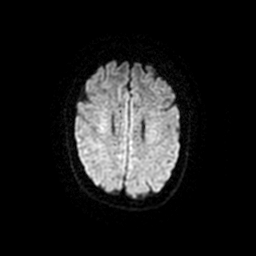
[im 97/110]
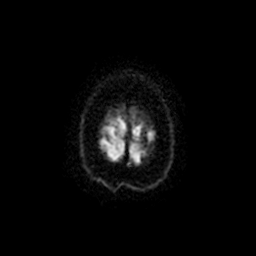
[im 110/110]
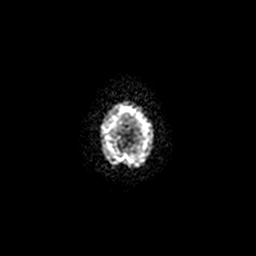

[Series 4: T1 · sagittal · 5.0mm · 0.47mm/px · 2 of 24 slices shown]
[im 1/24]
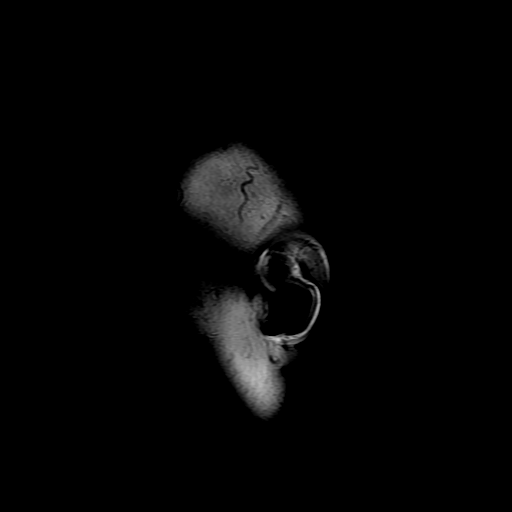
[im 24/24]
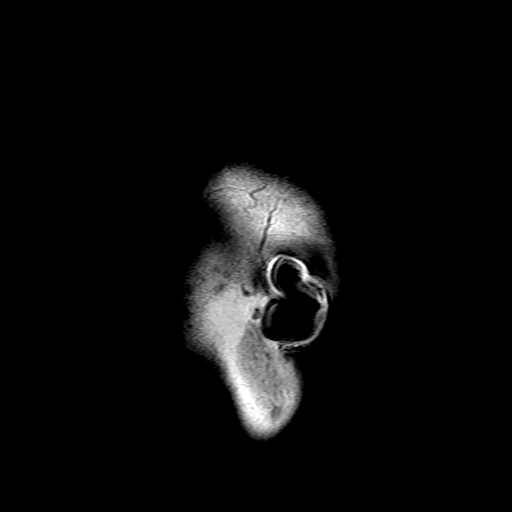

[Series 5: DWI · coronal · 4.0mm · 1.09mm/px · 7 of 84 slices shown (2 of 4)]
[im 1/84]
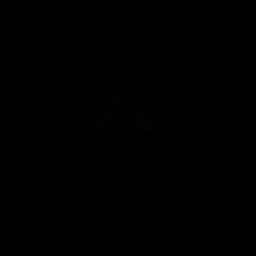
[im 14/84]
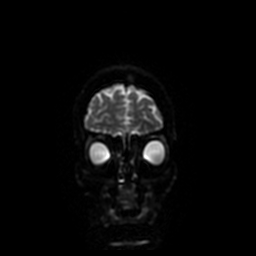
[im 28/84]
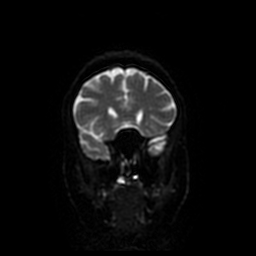
[im 42/84]
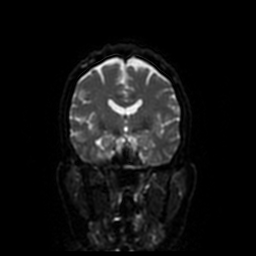
[im 56/84]
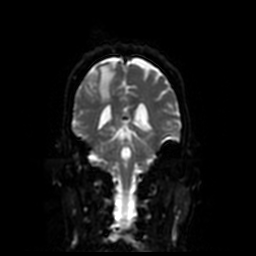
[im 70/84]
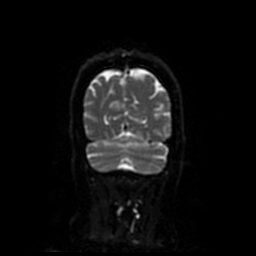
[im 84/84]
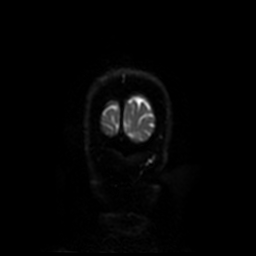

[Series 6: T2 · axial · 5.0mm · 0.43mm/px · z∈[-44,+112]mm · 2 of 24 slices shown]
[im 1/24]
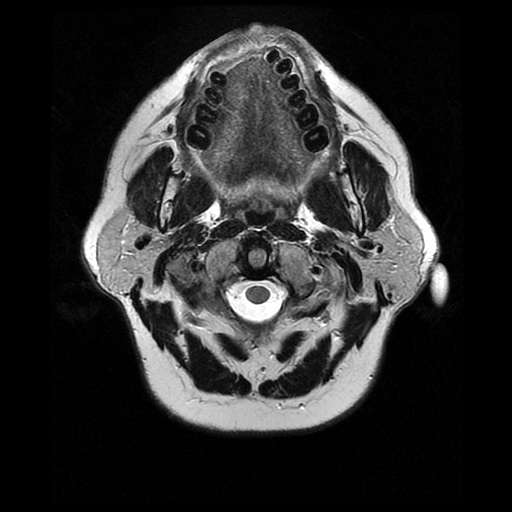
[im 24/24]
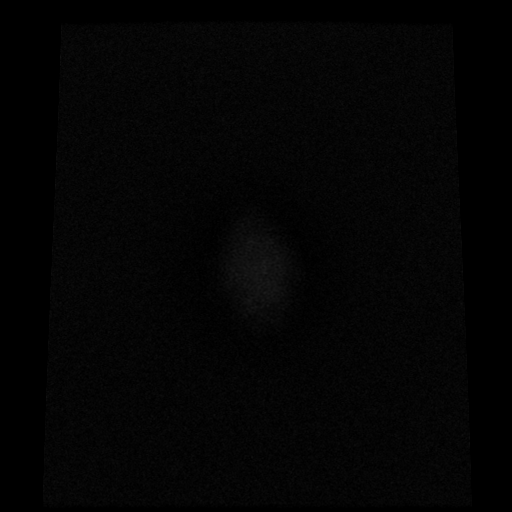

[Series 7: FLAIR · axial · 3.0mm · 0.43mm/px · z∈[-42,+110]mm · 2 of 27 slices shown]
[im 1/27]
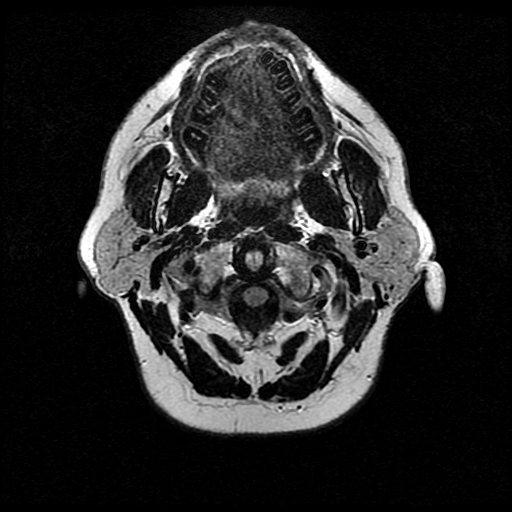
[im 27/27]
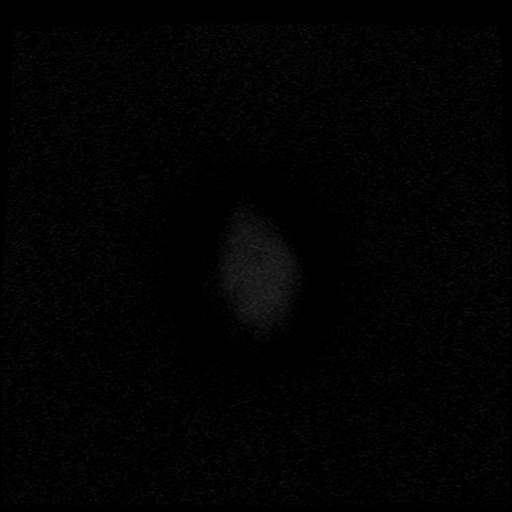

[Series 10: T2 post-contrast · coronal · 5.0mm · 0.45mm/px · 2 of 27 slices shown]
[im 1/27]
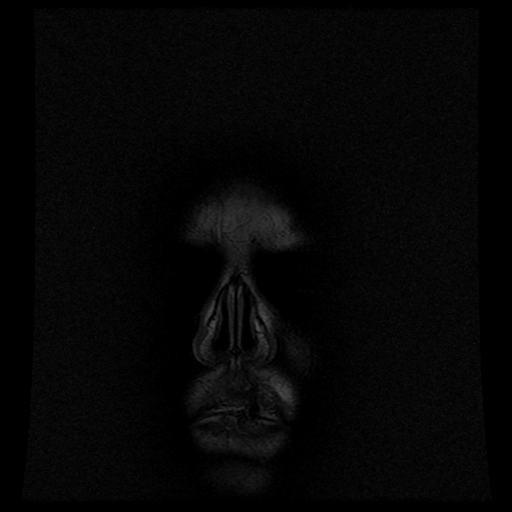
[im 27/27]
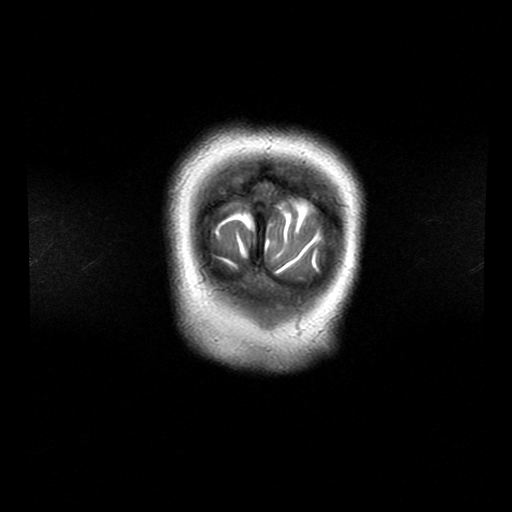

[Series 12: T1 post-contrast · coronal · 5.0mm · 0.45mm/px · 2 of 27 slices shown (1 of 2)]
[im 1/27]
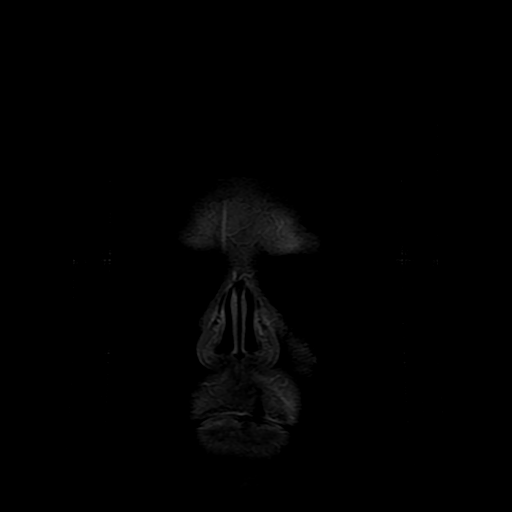
[im 27/27]
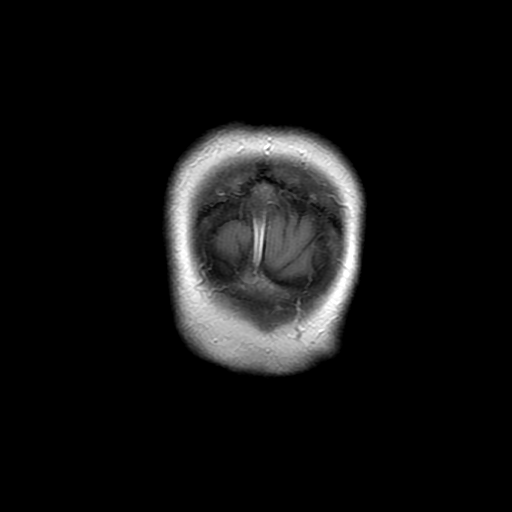

[Series 13: T1 post-contrast · sagittal · 5.0mm · 0.47mm/px · 2 of 24 slices shown (2 of 2)]
[im 1/24]
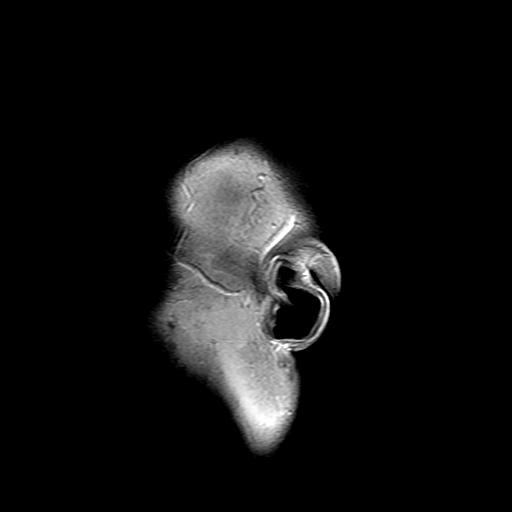
[im 24/24]
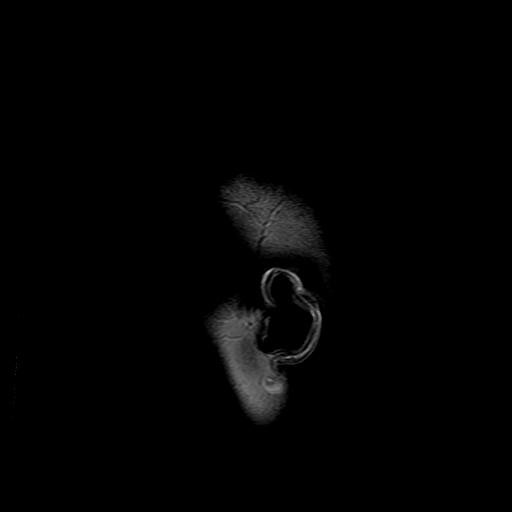

[Series 300: DWI · axial · 3.0mm · 1.09mm/px · z∈[-36,+125]mm · 5 of 55 slices shown (3 of 4)]
[im 1/55]
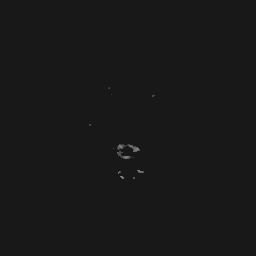
[im 14/55]
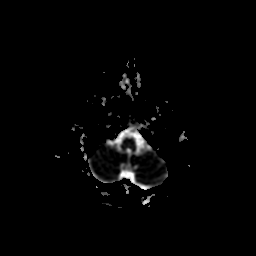
[im 28/55]
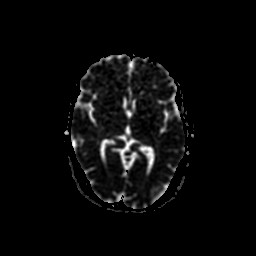
[im 41/55]
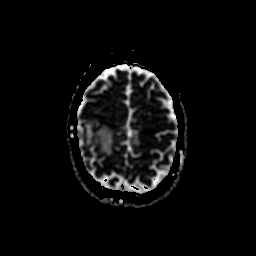
[im 55/55]
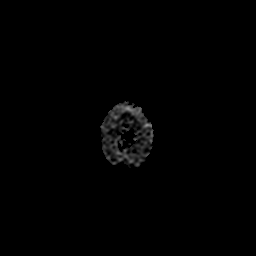

[Series 500: DWI · coronal · 4.0mm · 1.09mm/px · 3 of 42 slices shown (4 of 4)]
[im 1/42]
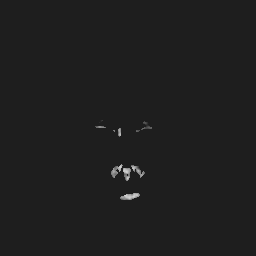
[im 21/42]
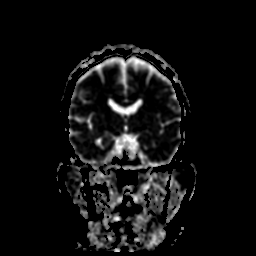
[im 42/42]
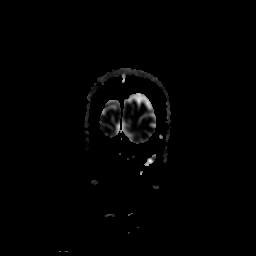

[35 of 48 positions shown; findings below may reference images not displayed]

FINDINGS: Brain: 14 mm enhancing mass in the right frontal parietal lobe
compatible with metastatic disease. Mild to moderate surrounding
vasogenic edema. This is near the motor strip.

Subtle 2 mm enhancing nodule left frontal cortex also likely due to
metastatic disease.

Ventricle size normal. No midline shift. Negative for hemorrhage. No
acute infarct. Minimal chronic white matter changes.

Vascular: Normal arterial flow voids

Skull and upper cervical spine: Multiple skull lesions are present
suspicious for metastatic disease. The most definitive is in the
left occipital bone which shows restricted diffusion and
enhancement. Other lesions are suspicious.

Sinuses/Orbits: Paranasal sinuses clear. Right mastoid effusion.
Normal orbit bilaterally.

Other: None
IMPRESSION: 14 mm metastatic deposit right frontal parietal lobe with
surrounding edema.

2 mm left frontal lesion highly suspicious for metastatic disease

Multiple skull lesions suspicious for metastatic disease. Left
occipital skull lesion is very suspicious for metastatic disease

These results will be called to the ordering clinician or
representative by the Radiologist Assistant, and communication
documented in the PACS or zVision Dashboard.

## 2018-12-07 MED ORDER — GADOBUTROL 1 MMOL/ML IV SOLN
8.0000 mL | Freq: Once | INTRAVENOUS | Status: AC | PRN
  Administered 2018-12-07: 8 mL via INTRAVENOUS

## 2018-12-12 ENCOUNTER — Ambulatory Visit (HOSPITAL_COMMUNITY)
Admission: RE | Admit: 2018-12-12 | Discharge: 2018-12-12 | Disposition: A | Discharge status: Home / Self Care | Payer: Managed Care, Other (non HMO) | Source: Ambulatory Visit | Attending: Cardiothoracic Surgery | Admitting: Cardiothoracic Surgery

## 2018-12-12 DIAGNOSIS — Z01818 Encounter for other preprocedural examination: Secondary | ICD-10-CM

## 2018-12-12 DIAGNOSIS — R911 Solitary pulmonary nodule: Secondary | ICD-10-CM | POA: Diagnosis not present

## 2018-12-12 LAB — GLUCOSE, CAPILLARY: Glucose-Capillary: 94 mg/dL (ref 70–99)

## 2018-12-12 IMAGING — PT NM PET TUM IMG INITIAL (PI) SKULL BASE T - THIGH
1 of 8 series · 1 of 25 positions shown · non-contrast
Comparison: CT [DATE]

CLINICAL DATA: Initial treatment strategy for LEFT upper lobe mass

EXAM:
NUCLEAR MEDICINE PET SKULL BASE TO THIGH
TECHNIQUE: Nine point mCi F-18 FDG was injected intravenously. Full-ring PET
imaging was performed from the skull base to thigh after the
radiotracer. CT data was obtained and used for attenuation
correction and anatomic localization.
Fasting blood glucose: 94 mg/dl

[Series 4: ct sk_thigh 5.0 b31f · axial · 5.0mm · 0.89mm/px · 1 of 230 slices shown]
[im 230/230  brain]
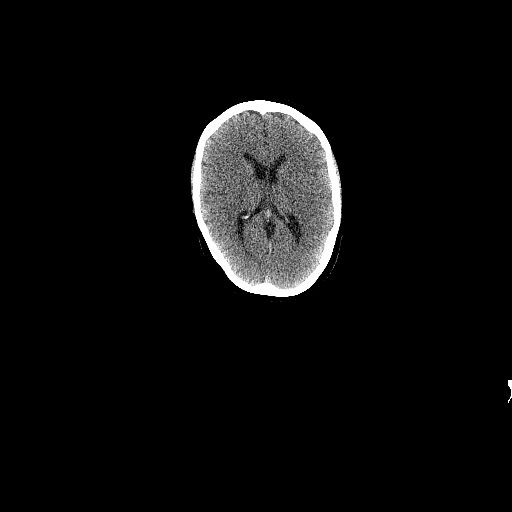

[1 of 25 positions shown; findings below may reference images not displayed]

FINDINGS: Mediastinal blood pool activity: SUV max

NECK: Hypermetabolic LEFT supraclavicular node with SUV max equal
7.4 measures 10 mm short axis (image 55/4)

Incidental CT findings: None

CHEST: A partially calcified hypermetabolic LEFT suprahilar mass
surrounds the LEFT upper lobe bronchus.. Mass is intensely
hypermetabolic with SUV max equal 12.9. Mass measures approximately
3.5 x 2.5 cm. Post obstructive segmental collapse of the LEFT upper
lobe.

There is hypermetabolic nodal extension to the mediastinum with a
hypermetabolic AP window node which difficult define on the CT
portion but evident on the PET data.

Along the most inferior aspect of the pleural surface of the LEFT
hemithorax there is hypermetabolic rim of metabolic activity (image
94 for example). Large LEFT effusion.

Incidental CT findings: Large LEFT pleural effusion

ABDOMEN/PELVIS: Focal metabolic activity of the RIGHT adrenal gland
with SUV max equal 6.3. Minimal enlargement the body to 8 mm. LEFT
adrenal gland appears normal.

No abnormal activity in liver. No hypermetabolic abdominopelvic
lymph nodes.

Incidental CT findings: none

SKELETON: Multiple foci of intense focal metabolic activity within
the spine, sacrum pelvis and including the RIGHT femur and LEFT
scapula.

Example lesion in the transverse process of the T2 vertebral body on
the LEFT with SUV max equal 11.9. There is destructive lesion at
this level. Example lesion in the LEFT acetabulum with SUV max equal
15.0. Subtle lucent lesion.

Example lesion in the L 3 vertebral body with SUV max equal 17.9.
Subtle lytic lesion measuring 2 cm this level.

Incidental CT findings: No pathologic fracture
IMPRESSION: 1. Intensely hypermetabolic LEFT suprahilar mass consistent primary
bronchogenic carcinoma. Post obstructive collapse of the LEFT upper
lobe.
2. Evidence of hypermetabolic nodal metastasis including the AP
window and LEFT supraclavicular node. LEFT supraclavicular node may
be amenable to biopsy.
3. Evidence of pleural metastasis in the LEFT hemithorax. Large LEFT
pleural effusion.
4. Multiple foci of hypermetabolic lytic skeletal metastasis in the
spine, scapula, pelvis, and RIGHT femur.
5. High concern for small RIGHT adrenal metastasis.

## 2018-12-12 MED ORDER — FLUDEOXYGLUCOSE F - 18 (FDG) INJECTION
9.3000 | Freq: Once | INTRAVENOUS | Status: AC | PRN
  Administered 2018-12-12: 9.3 via INTRAVENOUS

## 2018-12-13 ENCOUNTER — Ambulatory Visit: Payer: Managed Care, Other (non HMO) | Admitting: Cardiothoracic Surgery

## 2018-12-13 ENCOUNTER — Other Ambulatory Visit: Payer: Self-pay

## 2018-12-13 ENCOUNTER — Telehealth: Payer: Self-pay | Admitting: *Deleted

## 2018-12-13 ENCOUNTER — Encounter: Payer: Self-pay | Admitting: Cardiothoracic Surgery

## 2018-12-13 VITALS — BP 120/69 | HR 105 | Resp 16 | Ht 67.5 in | Wt 182.0 lb

## 2018-12-13 DIAGNOSIS — J90 Pleural effusion, not elsewhere classified: Secondary | ICD-10-CM

## 2018-12-13 DIAGNOSIS — R918 Other nonspecific abnormal finding of lung field: Secondary | ICD-10-CM

## 2018-12-13 NOTE — Progress Notes (Signed)
PCP is Buelah Manis, Modena Nunnery, MD Referring Provider is Delsa Grana, PA-C  Chief Complaint  Patient presents with  . Follow-up    after testing PET, MRI..12/12/18...had a thoracentesis on 12/06/18 with 1.5 L  drained  . Lung Mass    HPI:   Past Medical History:  Diagnosis Date  . Active smoker   . Depression   . H/O chronic bronchitis   . Thyroid disease    hypothyroid    Past Surgical History:  Procedure Laterality Date  . TONSILLECTOMY    . TUBAL LIGATION      Family History  Problem Relation Age of Onset  . Cancer Mother        colon  . Alcohol abuse Father     Social History Social History   Tobacco Use  . Smoking status: Former Smoker    Packs/day: 0.50    Types: Cigarettes    Last attempt to quit: 06/03/2015    Years since quitting: 3.5  . Smokeless tobacco: Never Used  Substance Use Topics  . Alcohol use: No    Alcohol/week: 0.0 standard drinks  . Drug use: No    Current Outpatient Medications  Medication Sig Dispense Refill  . azelastine (ASTELIN) 0.1 % nasal spray Place 1 spray into both nostrils 2 (two) times daily as needed for rhinitis. Use in each nostril as directed 30 mL 12  . Cetirizine HCl (ZYRTEC ALLERGY) 10 MG CAPS Take 1 capsule (10 mg total) by mouth daily as needed (allergies). 30 capsule 11  . cyclobenzaprine (FLEXERIL) 10 MG tablet Take 1 tablet (10 mg total) by mouth 3 (three) times daily as needed for muscle spasms. 30 tablet 0  . famotidine (PEPCID) 20 MG tablet Take 20 mg by mouth daily.    . Ibuprofen (ADVIL PO) Take 1 tablet by mouth as needed (220mg  OTC).    Marland Kitchen levalbuterol (XOPENEX HFA) 45 MCG/ACT inhaler Inhale 1-2 puffs into the lungs every 4 (four) hours as needed for wheezing or shortness of breath. 1 Inhaler 0  . levothyroxine (SYNTHROID, LEVOTHROID) 112 MCG tablet TAKE 1 TABLET (112 MCG TOTAL) BY MOUTH DAILY BEFORE BREAKFAST. 90 tablet 1   No current facility-administered medications for this visit.     Allergies  Allergen  Reactions  . Albuterol Hives and Itching    Review of Systems  BP 120/69 (BP Location: Left Arm, Patient Position: Sitting, Cuff Size: Large)   Pulse (!) 105   Resp 16   Ht 5' 7.5" (1.715 m)   Wt 182 lb (82.6 kg)   SpO2 94% Comment: RA  BMI 28.08 kg/m  Physical Exam   Diagnostic Tests:   Impression:   Plan:   Tina Childs, MD [facility] [phone]

## 2018-12-13 NOTE — Progress Notes (Signed)
PCP is Buelah Manis, Modena Nunnery, MD Referring Provider is Delsa Grana, PA-C  Chief Complaint  Patient presents with  . Follow-up    after testing PET, MRI..12/12/18...had a thoracentesis on 12/06/18 with 1.5 L  drained  . Lung Mass    HPI: Patient turns to discuss recently diagnosed left upper lobe tumor.  There is invasion into the pulmonary artery and AP window.  Thoracentesis removed 1 L of fluid with cytology positive for metastatic adenocarcinoma lung.  I called pathology and they will do the foundation 1 analysis for genetic markers.  PET scan shows intense uptake of the main tumor in the AP window and upper lobe, positive left supraclavicular node, positive mets in the spine and sacrum, positive medicine fever and acetabulum and ribs. Brain MRI shows metastatic disease in the skull and left frontal lobe 2 cm mass.  Patient is complaining of back pain not relieved by Advil.  I gave her a prescription for oxycodone.  Patient will be referred to the thoracic oncology clinic, Dr. Earlie Server.  Past Medical History:  Diagnosis Date  . Active smoker   . Depression   . H/O chronic bronchitis   . Thyroid disease    hypothyroid    Past Surgical History:  Procedure Laterality Date  . TONSILLECTOMY    . TUBAL LIGATION      Family History  Problem Relation Age of Onset  . Cancer Mother        colon  . Alcohol abuse Father     Social History Social History   Tobacco Use  . Smoking status: Former Smoker    Packs/day: 0.50    Types: Cigarettes    Last attempt to quit: 06/03/2015    Years since quitting: 3.5  . Smokeless tobacco: Never Used  Substance Use Topics  . Alcohol use: No    Alcohol/week: 0.0 standard drinks  . Drug use: No    Current Outpatient Medications  Medication Sig Dispense Refill  . azelastine (ASTELIN) 0.1 % nasal spray Place 1 spray into both nostrils 2 (two) times daily as needed for rhinitis. Use in each nostril as directed 30 mL 12  . Cetirizine HCl (ZYRTEC  ALLERGY) 10 MG CAPS Take 1 capsule (10 mg total) by mouth daily as needed (allergies). 30 capsule 11  . cyclobenzaprine (FLEXERIL) 10 MG tablet Take 1 tablet (10 mg total) by mouth 3 (three) times daily as needed for muscle spasms. 30 tablet 0  . famotidine (PEPCID) 20 MG tablet Take 20 mg by mouth daily.    . Ibuprofen (ADVIL PO) Take 1 tablet by mouth as needed (220mg  OTC).    Marland Kitchen levalbuterol (XOPENEX HFA) 45 MCG/ACT inhaler Inhale 1-2 puffs into the lungs every 4 (four) hours as needed for wheezing or shortness of breath. 1 Inhaler 0  . levothyroxine (SYNTHROID, LEVOTHROID) 112 MCG tablet TAKE 1 TABLET (112 MCG TOTAL) BY MOUTH DAILY BEFORE BREAKFAST. 90 tablet 1   No current facility-administered medications for this visit.     Allergies  Allergen Reactions  . Albuterol Hives and Itching    Review of Systems  Patient having significant back pain, poor appetite, fatigue, pain in her back with the thoracentesis was performed  BP 120/69 (BP Location: Left Arm, Patient Position: Sitting, Cuff Size: Large)   Pulse (!) 105   Resp 16   Ht 5' 7.5" (1.715 m)   Wt 182 lb (82.6 kg)   SpO2 94% Comment: RA  BMI 28.08 kg/m  Physical Exam Slight diminished  breath sounds at the left base No friction rub or gallop No JVD or neck mass Peripheral edema No focal motor deficit  Diagnostic Tests: Results of PET scan and brain MRI and thoracentesis cytology all reviewed with patient  Impression: 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 I will see the patient back in 3 weeks with chest x-ray to see if the malignant effusion recurs and would need a Pleurx catheter.  Len Childs, MD [facility] [phone]

## 2018-12-13 NOTE — Telephone Encounter (Signed)
Oncology Nurse Navigator Documentation  Oncology Nurse Navigator Flowsheets 12/13/2018  Navigator Location [facility]  Referral date to RadOnc/MedOnc 12/13/2018  Navigator Encounter Type Introductory phone call;Telephone  Telephone Outgoing Call/I received referral on Tina Cruz from Dr. Darcey Nora.  I communicated with them that patient could be seen tomorrow.  They updated Tina Cruz. I called her and also updated her on appt and what to expect.  She verbalized understanding of appt time and place.   Confirmed Diagnosis Date 12/06/2018  Treatment Phase Pre-Tx/Tx Discussion  Barriers/Navigation Needs Education;Coordination of Care  Education Other  Interventions Coordination of Care;Education  Coordination of Care Appts  Education Method Verbal  Acuity Level 2  Time Spent with Patient 30

## 2018-12-14 ENCOUNTER — Other Ambulatory Visit: Payer: Self-pay

## 2018-12-14 ENCOUNTER — Inpatient Hospital Stay: Payer: Managed Care, Other (non HMO) | Admitting: Internal Medicine

## 2018-12-14 ENCOUNTER — Inpatient Hospital Stay: Payer: Managed Care, Other (non HMO)

## 2018-12-14 ENCOUNTER — Encounter: Payer: Self-pay | Admitting: Internal Medicine

## 2018-12-14 ENCOUNTER — Telehealth: Payer: Self-pay | Admitting: Internal Medicine

## 2018-12-14 VITALS — BP 145/76 | HR 98 | Temp 98.0°F | Resp 20 | Ht 67.0 in | Wt 181.6 lb

## 2018-12-14 DIAGNOSIS — E039 Hypothyroidism, unspecified: Secondary | ICD-10-CM

## 2018-12-14 DIAGNOSIS — Z800 Family history of malignant neoplasm of digestive organs: Secondary | ICD-10-CM

## 2018-12-14 DIAGNOSIS — C3412 Malignant neoplasm of upper lobe, left bronchus or lung: Secondary | ICD-10-CM

## 2018-12-14 DIAGNOSIS — C778 Secondary and unspecified malignant neoplasm of lymph nodes of multiple regions: Secondary | ICD-10-CM

## 2018-12-14 DIAGNOSIS — M545 Low back pain: Secondary | ICD-10-CM | POA: Diagnosis not present

## 2018-12-14 DIAGNOSIS — C7931 Secondary malignant neoplasm of brain: Secondary | ICD-10-CM | POA: Diagnosis not present

## 2018-12-14 DIAGNOSIS — Z79890 Hormone replacement therapy (postmenopausal): Secondary | ICD-10-CM | POA: Diagnosis not present

## 2018-12-14 DIAGNOSIS — G893 Neoplasm related pain (acute) (chronic): Secondary | ICD-10-CM | POA: Insufficient documentation

## 2018-12-14 DIAGNOSIS — J189 Pneumonia, unspecified organism: Secondary | ICD-10-CM | POA: Diagnosis not present

## 2018-12-14 DIAGNOSIS — R634 Abnormal weight loss: Secondary | ICD-10-CM

## 2018-12-14 DIAGNOSIS — Z510 Encounter for antineoplastic radiation therapy: Secondary | ICD-10-CM | POA: Diagnosis not present

## 2018-12-14 DIAGNOSIS — Z7189 Other specified counseling: Secondary | ICD-10-CM

## 2018-12-14 DIAGNOSIS — C3492 Malignant neoplasm of unspecified part of left bronchus or lung: Secondary | ICD-10-CM | POA: Diagnosis not present

## 2018-12-14 DIAGNOSIS — Z79899 Other long term (current) drug therapy: Secondary | ICD-10-CM

## 2018-12-14 DIAGNOSIS — J90 Pleural effusion, not elsewhere classified: Secondary | ICD-10-CM

## 2018-12-14 DIAGNOSIS — K5900 Constipation, unspecified: Secondary | ICD-10-CM | POA: Insufficient documentation

## 2018-12-14 DIAGNOSIS — R918 Other nonspecific abnormal finding of lung field: Secondary | ICD-10-CM

## 2018-12-14 DIAGNOSIS — Z87891 Personal history of nicotine dependence: Secondary | ICD-10-CM

## 2018-12-14 DIAGNOSIS — C782 Secondary malignant neoplasm of pleura: Secondary | ICD-10-CM | POA: Diagnosis not present

## 2018-12-14 DIAGNOSIS — R0902 Hypoxemia: Secondary | ICD-10-CM | POA: Diagnosis not present

## 2018-12-14 DIAGNOSIS — C7971 Secondary malignant neoplasm of right adrenal gland: Secondary | ICD-10-CM | POA: Diagnosis not present

## 2018-12-14 DIAGNOSIS — C7951 Secondary malignant neoplasm of bone: Secondary | ICD-10-CM

## 2018-12-14 DIAGNOSIS — Z79891 Long term (current) use of opiate analgesic: Secondary | ICD-10-CM | POA: Diagnosis not present

## 2018-12-14 DIAGNOSIS — Z7952 Long term (current) use of systemic steroids: Secondary | ICD-10-CM | POA: Diagnosis not present

## 2018-12-14 DIAGNOSIS — R590 Localized enlarged lymph nodes: Secondary | ICD-10-CM | POA: Diagnosis not present

## 2018-12-14 DIAGNOSIS — J910 Malignant pleural effusion: Secondary | ICD-10-CM | POA: Diagnosis not present

## 2018-12-14 LAB — COMPREHENSIVE METABOLIC PANEL
ALT: 24 U/L (ref 0–44)
AST: 22 U/L (ref 15–41)
Albumin: 4.3 g/dL (ref 3.5–5.0)
Alkaline Phosphatase: 68 U/L (ref 38–126)
Anion gap: 9 (ref 5–15)
BUN: 10 mg/dL (ref 6–20)
CHLORIDE: 104 mmol/L (ref 98–111)
CO2: 25 mmol/L (ref 22–32)
CREATININE: 0.85 mg/dL (ref 0.44–1.00)
Calcium: 9.7 mg/dL (ref 8.9–10.3)
GFR calc Af Amer: >60 mL/min (ref >60)
GFR calc non Af Amer: >60 mL/min (ref >60)
Glucose, Bld: 103 mg/dL — ABNORMAL HIGH (ref 70–99)
POTASSIUM: 4.1 mmol/L (ref 3.5–5.1)
Sodium: 138 mmol/L (ref 135–145)
Total Bilirubin: 1.2 mg/dL (ref 0.3–1.2)
Total Protein: 7.2 g/dL (ref 6.5–8.1)

## 2018-12-14 LAB — CBC WITH DIFFERENTIAL (CANCER CENTER ONLY)
Abs Immature Granulocytes: 0.08 10*3/uL — ABNORMAL HIGH (ref 0.00–0.07)
Abs Immature Granulocytes: 0.07 10*3/uL (ref 0.00–0.07)
Basophils Absolute: 0 10*3/uL (ref 0.0–0.1)
Basophils Absolute: 0.1 10*3/uL (ref 0.0–0.1)
Basophils Relative: 0 %
Basophils Relative: 0 %
Eosinophils Absolute: 0.2 10*3/uL (ref 0.0–0.5)
Eosinophils Absolute: 0.2 10*3/uL (ref 0.0–0.5)
Eosinophils Relative: 2 %
Eosinophils Relative: 1 %
HCT: 38.9 % (ref 36.0–46.0)
HCT: 39.4 % (ref 36.0–46.0)
Hemoglobin: 13.3 g/dL (ref 12.0–15.0)
Hemoglobin: 13.5 g/dL (ref 12.0–15.0)
Immature Granulocytes: 1 %
Immature Granulocytes: 1 %
Lymphocytes Relative: 16 %
Lymphocytes Relative: 12 %
Lymphs Abs: 1.8 10*3/uL (ref 0.7–4.0)
Lymphs Abs: 1.6 10*3/uL (ref 0.7–4.0)
MCH: 30.7 pg (ref 26.0–34.0)
MCH: 30.8 pg (ref 26.0–34.0)
MCHC: 34.2 g/dL (ref 30.0–36.0)
MCHC: 34.3 g/dL (ref 30.0–36.0)
MCV: 89.8 fL (ref 80.0–100.0)
MCV: 89.7 fL (ref 80.0–100.0)
MONOS PCT: 8 %
Monocytes Absolute: 0.9 10*3/uL (ref 0.1–1.0)
Monocytes Absolute: 0.9 10*3/uL (ref 0.1–1.0)
Monocytes Relative: 6 %
Neutro Abs: 8.6 10*3/uL — ABNORMAL HIGH (ref 1.7–7.7)
Neutro Abs: 10.6 10*3/uL — ABNORMAL HIGH (ref 1.7–7.7)
Neutrophils Relative %: 73 %
Neutrophils Relative %: 80 %
Platelet Count: 342 10*3/uL (ref 150–400)
Platelet Count: 370 10*3/uL (ref 150–400)
RBC: 4.33 MIL/uL (ref 3.87–5.11)
RBC: 4.39 MIL/uL (ref 3.87–5.11)
RDW: 11.9 % (ref 11.5–15.5)
RDW: 12.1 % (ref 11.5–15.5)
WBC Count: 11.6 10*3/uL — ABNORMAL HIGH (ref 4.0–10.5)
WBC Count: 13.4 10*3/uL — ABNORMAL HIGH (ref 4.0–10.5)
nRBC: 0 % (ref 0.0–0.2)
nRBC: 0 % (ref 0.0–0.2)

## 2018-12-14 MED ORDER — DEXAMETHASONE 4 MG PO TABS: 4.0000 mg | ORAL_TABLET | Freq: Two times a day (BID) | ORAL | 1 refills | Status: DC

## 2018-12-14 NOTE — Progress Notes (Signed)
[place] [phone]   [phone]  CONSULT NOTE  REFERRING PHYSICIAN: Dr. Tharon Aquas Trigt  REASON FOR CONSULTATION:  58 years old white female recently diagnosed with lung cancer.  HPI Tina Cruz is a 58 y.o. female with several years of intermittent smoking as well as past medical history significant for depression, and hypothyroidism.  The patient mentions that over the last 3 months she has been complaining of persistent productive cough.  She was seen by her primary care physician and treated for chronic bronchitis with several courses of antibiotics as well as steroids.  She has no improvement in her condition and chest x-ray was performed in November 02, 2018 and that showed left suprahilar airspace disease with suspicious mass in the AP window.  This was followed by CT scan of the chest on November 11, 2018 and that showed an irregular infiltrating mass noted in the medial left upper lobe which abuts and likely extends into the mediastinum.  There is an adjacent postobstructive atelectasis or pneumonia in the left upper lobe.  It was difficult to accurately measure the mass but it was approximately 4.2 x 3.2 cm with extension to the superior left hilum and occlusion of the apical branch of the left upper lobe bronchus.  There was also moderate left pleural effusion.  There was nodularity in the pleural space concerning for pleural spread of tumor.  This scan also showed AP window adenopathy with prevascular adenopathy and upper prevascular lymph nodes measuring 1.0 cm.  There was numerous similarly sized or smaller prevascular lymph nodes and mildly enlarged low right paratracheal lymph node with short axis diameter of 0.9 cm.  There was also AP window nodal mass measuring 0.9 cm.  The patient was referred to Dr. Darcey Nora and on 12/06/2018 she underwent ultrasound-guided left thoracentesis with drainage of 1.5 L of pleural fluid.  The final cytology  ([id number]) showed malignant cells consistent with metastatic lung adenocarcinoma.  The malignant cells are positive for cytokeratin 7, Napsin-A and TTF-1.  There was focal nonspecific staining for cytokeratin 20.  The malignant cells are negative for CDX 2, estrogen receptor, GATA-3, GCDFP-15, Caletinin and WT-1.  The findings are consistent with metastatic lung adenocarcinoma. The patient also had MRI of the brain on 12/07/2018 and that showed 1.4 cm metastatic deposit in the right frontal parietal lobe with surrounding edema.  There was 0.2 cm left frontal lesion highly suspicious for metastatic disease in addition to multiple skull lesions suspicious for metastatic disease.  There was also a left occipital skull lesion very suspicious for metastatic disease. A PET scan was performed on December 12, 2018 and that showed intensely hypermetabolic left suprahilar mass consistent with primary bronchogenic carcinoma.  There was postobstructive collapse of the left upper lobe.  There was evidence of hypermetabolic nodal metastasis including the AP window and left supraclavicular node.  There was evidence of pleural metastasis in the left hemithorax with large left pleural effusion as well as multiple foci of hypermetabolic lytic skeletal metastases in the spine, scapula, pelvis and right femur.  There was also high concern for a small right adrenal metastasis. Dr. Darcey Nora kindly referred the patient to me today for evaluation and recommendation regarding treatment of her condition. When seen today she is feeling fine except for constipation and she took an enema today.  She did have a bowel movement later today.  She continues to have lower back pain and she is currently on oxycodone.  She  has no nausea, vomiting or diarrhea.  She has no chest pain but has shortness of breath with exertion which improved after the left thoracentesis.  She has no hemoptysis.  The patient denied having any headache but has occasional  blurry vision.  She lost around 6 pounds in the last 2 months. Family history significant for mother with colon cancer in her 9s, father died from alcoholic complication and half brother had stomach cancer. The patient is married and has 2 children a son and daughter.  Her son lives locally and her daughter is in [place].  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  She has a history for smoking intermittently 1 pack/day for around 10-15 years.  She quit completely 5 years ago.  She has no history of alcohol or drug abuse.  HPI  Past Medical History:  Diagnosis Date  . Active smoker   . Depression   . H/O chronic bronchitis   . Thyroid disease    hypothyroid    Past Surgical History:  Procedure Laterality Date  . TONSILLECTOMY    . TUBAL LIGATION      Family History  Problem Relation Age of Onset  . Cancer Mother        colon  . Alcohol abuse Father     Social History Social History   Tobacco Use  . Smoking status: Former Smoker    Packs/day: 0.50    Types: Cigarettes    Last attempt to quit: 06/03/2015    Years since quitting: 3.5  . Smokeless tobacco: Never Used  Substance Use Topics  . Alcohol use: No    Alcohol/week: 0.0 standard drinks  . Drug use: No    Allergies  Allergen Reactions  . Albuterol Hives and Itching    Current Outpatient Medications  Medication Sig Dispense Refill  . oxyCODONE (OXY IR/ROXICODONE) 5 MG immediate release tablet Take 5 mg by mouth 2 (two) times daily.    Marland Kitchen azelastine (ASTELIN) 0.1 % nasal spray Place 1 spray into both nostrils 2 (two) times daily as needed for rhinitis. Use in each nostril as directed 30 mL 12  . Cetirizine HCl (ZYRTEC ALLERGY) 10 MG CAPS Take 1 capsule (10 mg total) by mouth daily as needed (allergies). 30 capsule 11  . cyclobenzaprine (FLEXERIL) 10 MG tablet Take 1 tablet (10 mg total) by mouth 3 (three) times daily as needed for muscle spasms. 30 tablet 0  . famotidine  (PEPCID) 20 MG tablet Take 20 mg by mouth daily.    . Ibuprofen (ADVIL PO) Take 1 tablet by mouth as needed (235m OTC).    .Marland Kitchenlevalbuterol (XOPENEX HFA) 45 MCG/ACT inhaler Inhale 1-2 puffs into the lungs every 4 (four) hours as needed for wheezing or shortness of breath. 1 Inhaler 0  . levothyroxine (SYNTHROID, LEVOTHROID) 112 MCG tablet TAKE 1 TABLET (112 MCG TOTAL) BY MOUTH DAILY BEFORE BREAKFAST. 90 tablet 1   No current facility-administered medications for this visit.     Review of Systems  Constitutional: positive for fatigue and weight loss Eyes: negative Ears, nose, mouth, throat, and face: negative Respiratory: positive for dyspnea on exertion Cardiovascular: negative Gastrointestinal: negative Genitourinary:negative Integument/breast: negative Hematologic/lymphatic: negative Musculoskeletal:positive for back pain Neurological: negative Behavioral/Psych: negative Endocrine: negative Allergic/Immunologic: negative  Physical Exam  RMQK:MMNOT healthy, no distress, well nourished and well developed SKIN: skin color, texture, turgor are normal, no rashes or significant lesions HEAD: Normocephalic, No masses, lesions, tenderness or abnormalities EYES:  normal, PERRLA, Conjunctiva are pink and non-injected EARS: External ears normal, Canals clear OROPHARYNX:no exudate, no erythema and lips, buccal mucosa, and tongue normal  NECK: supple, no adenopathy, no JVD LYMPH:  no palpable lymphadenopathy, no hepatosplenomegaly BREAST:not examined LUNGS: decreased breath sounds HEART: regular rate & rhythm, no murmurs and no gallops ABDOMEN:abdomen soft, non-tender, normal bowel sounds and no masses or organomegaly BACK: No CVA tenderness, Range of motion is normal EXTREMITIES:no joint deformities, effusion, or inflammation, no edema  NEURO: alert & oriented x 3 with fluent speech, no focal motor/sensory deficits  PERFORMANCE STATUS: ECOG 1  LABORATORY DATA: Lab Results    Component Value Date   WBC 11.6 (H) 12/14/2018   HGB 13.3 12/14/2018   HCT 38.9 12/14/2018   MCV 89.8 12/14/2018   PLT 342 12/14/2018      Chemistry      Component Value Date/Time   NA 141 11/04/2018 0938   K 3.9 11/04/2018 0938   CL 106 11/04/2018 0938   CO2 25 11/04/2018 0938   BUN 13 11/04/2018 0938   CREATININE 0.84 11/04/2018 0938      Component Value Date/Time   CALCIUM 9.8 11/04/2018 0938   ALKPHOS 63 07/22/2016 0928   AST 16 11/04/2018 0938   ALT 28 11/04/2018 0938   BILITOT 1.0 11/04/2018 0938       RADIOGRAPHIC STUDIES: Dg Chest 1 View  Result Date: 12/06/2018 CLINICAL DATA:  Post left thoracentesis. EXAM: CHEST  1 VIEW COMPARISON:  Chest radiograph 11/02/2018 FINDINGS: Aeration in left lung has minimally changed following the thoracentesis. Persistent opacity and volume loss in the left upper lobe compatible with the known lesion. Negative for a pneumothorax. Right lung is clear. Cardiomediastinal silhouette is unchanged. Stable blunting at the left costophrenic angle. IMPRESSION: 1. Negative for pneumothorax following the left thoracentesis. 2. Aeration in the left lung has not significantly changed. Persistent opacity and volume loss in the left upper lobe. Electronically Signed   By: Markus Daft M.D.   On: 12/06/2018 12:20   Mr Jeri Cos JS Contrast  Result Date: 12/07/2018 CLINICAL DATA:  Solitary pulmonary nodule.  Staging lung cancer EXAM: MRI HEAD WITHOUT AND WITH CONTRAST TECHNIQUE: Multiplanar, multiecho pulse sequences of the brain and surrounding structures were obtained without and with intravenous contrast. CONTRAST:  8 mL Gadovist IV COMPARISON:  None. FINDINGS: Brain: 14 mm enhancing mass in the right frontal parietal lobe compatible with metastatic disease. Mild to moderate surrounding vasogenic edema. This is near the motor strip. Subtle 2 mm enhancing nodule left frontal cortex also likely due to metastatic disease. Ventricle size normal. No midline  shift. Negative for hemorrhage. No acute infarct. Minimal chronic white matter changes. Vascular: Normal arterial flow voids Skull and upper cervical spine: Multiple skull lesions are present suspicious for metastatic disease. The most definitive is in the left occipital bone which shows restricted diffusion and enhancement. Other lesions are suspicious. Sinuses/Orbits: Paranasal sinuses clear. Right mastoid effusion. Normal orbit bilaterally. Other: None IMPRESSION: 14 mm metastatic deposit right frontal parietal lobe with surrounding edema. 2 mm left frontal lesion highly suspicious for metastatic disease Multiple skull lesions suspicious for metastatic disease. Left occipital skull lesion is very suspicious for metastatic disease These results will be called to the ordering clinician or representative by the Radiologist Assistant, and communication documented in the PACS or zVision Dashboard. Electronically Signed   By: Franchot Gallo M.D.   On: 12/07/2018 16:59   Nm Pet Image Initial (pi) Skull Base To Thigh  Result Date: 12/12/2018 CLINICAL DATA:  Initial treatment strategy for LEFT upper lobe mass EXAM: NUCLEAR MEDICINE PET SKULL BASE TO THIGH TECHNIQUE: Nine point mCi F-18 FDG was injected intravenously. Full-ring PET imaging was performed from the skull base to thigh after the radiotracer. CT data was obtained and used for attenuation correction and anatomic localization. Fasting blood glucose: 94 mg/dl COMPARISON:  CT 11/11/2018 FINDINGS: Mediastinal blood pool activity: SUV max 2.5 NECK: Hypermetabolic LEFT supraclavicular node with SUV max equal 7.4 measures 10 mm short axis (image 55/4) Incidental CT findings: None CHEST: A partially calcified hypermetabolic LEFT suprahilar mass surrounds the LEFT upper lobe bronchus. Mass is intensely hypermetabolic with SUV max equal 12.9. Mass measures approximately 3.5 x 2.5 cm. Post obstructive segmental collapse of the LEFT upper lobe. There is hypermetabolic  nodal extension to the mediastinum with a hypermetabolic AP window node which difficult define on the CT portion but evident on the PET data. Along the most inferior aspect of the pleural surface of the LEFT hemithorax there is hypermetabolic rim of metabolic activity (image 94 for example). Large LEFT effusion. Incidental CT findings: Large LEFT pleural effusion ABDOMEN/PELVIS: Focal metabolic activity of the RIGHT adrenal gland with SUV max equal 6.3. Minimal enlargement the body to 8 mm. LEFT adrenal gland appears normal. No abnormal activity in liver. No hypermetabolic abdominopelvic lymph nodes. Incidental CT findings: none SKELETON: Multiple foci of intense focal metabolic activity within the spine, sacrum pelvis and including the RIGHT femur and LEFT scapula. Example lesion in the transverse process of the T2 vertebral body on the LEFT with SUV max equal 11.9. There is destructive lesion at this level. Example lesion in the LEFT acetabulum with SUV max equal 15.0. Subtle lucent lesion. Example lesion in the L 3 vertebral body with SUV max equal 17.9. Subtle lytic lesion measuring 2 cm this level. Incidental CT findings: No pathologic fracture IMPRESSION: 1. Intensely hypermetabolic LEFT suprahilar mass consistent primary bronchogenic carcinoma. Post obstructive collapse of the LEFT upper lobe. 2. Evidence of hypermetabolic nodal metastasis including the AP window and LEFT supraclavicular node. LEFT supraclavicular node may be amenable to biopsy. 3. Evidence of pleural metastasis in the LEFT hemithorax. Large LEFT pleural effusion. 4. Multiple foci of hypermetabolic lytic skeletal metastasis in the spine, scapula, pelvis, and RIGHT femur. 5. High concern for small RIGHT adrenal metastasis. Electronically Signed   By: Suzy Bouchard M.D.   On: 12/12/2018 10:15   US Thoracentesis Asp Pleural Space W/img Guide  Result Date: 12/06/2018 INDICATION: Left upper lobe mass with extension into the mediastinum.  Pleural effusion. Request for diagnostic and therapeutic thoracentesis. EXAM: ULTRASOUND GUIDED LEFT THORACENTESIS MEDICATIONS: 1% lidocaine 10 mL COMPLICATIONS: None immediate. PROCEDURE: An ultrasound guided thoracentesis was thoroughly discussed with the patient and questions answered. The benefits, risks, alternatives and complications were also discussed. The patient understands and wishes to proceed with the procedure. Written consent was obtained. Ultrasound was performed to localize and mark an adequate pocket of fluid in the left chest. The area was then prepped and draped in the normal sterile fashion. 1% Lidocaine was used for local anesthesia. Under ultrasound guidance a 6 Fr Safe-T-Centesis catheter was introduced. Thoracentesis was performed. The catheter was removed and a dressing applied. FINDINGS: A total of approximately 1.5 L of hazy yellow fluid was removed. Samples were sent to the laboratory as requested by the clinical team. IMPRESSION: Successful ultrasound guided left thoracentesis yielding 1.5 L of pleural fluid. No pneumothorax on post-procedure chest x-ray. Read by: Abigail Butts  Benjie Karvonen, PA-C Electronically Signed   By: Jerilynn Mages.  Shick M.D.   On: 12/06/2018 13:15    ASSESSMENT: This is a very pleasant 58 years old white female recently diagnosed with a stage IV (T2b, N3, M1 C) non-small cell lung cancer, adenocarcinoma presented with left suprahilar mass with postobstructive collapse of the left upper lobe in addition to mediastinal and left supraclavicular lymphadenopathy as well as malignant left pleural effusion with pleural-based metastasis and metastatic disease to the bone, right adrenal gland and brain diagnosed in February 2020.   PLAN: I had a lengthy discussion with the patient today about her current disease stage, prognosis and treatment options. I personally and independently reviewed her scan images and discussed the result and showed the images to the patient today. I explained to  the patient that she has incurable condition and all the treatment will be of palliative nature. I recommended for the patient to send the tissue block to [facility] for molecular studies and PDL 1 expression.  In the meantime I will send blood test for EGFR mutation analysis. I will refer the patient to radiation oncology for palliative radiotherapy to the brain as well as the metastatic bone disease and the obstructive left suprahilar mass. I will refill the patient back to Dr. Prescott Gum for consideration of left Pleurx catheter placement for drainage of the recurrent left pleural effusion. For pain management the patient will continue her current treatment with oxycodone for now. For constipation she was advised to take MiraLAX on as-needed basis. For the vasogenic edema from the brain metastasis, I will start the patient on Decadron 4 mg p.o. twice daily and this dose will be tapered slowly by radiation oncology during her treatment. I will see the patient back for follow-up visit in around 3 weeks after the availability of the molecular study results for more detailed discussion of her treatment options including palliative care, systemic chemotherapy in combination with immunotherapy versus targeted therapy if the patient has any actionable mutation. The patient agreed to the current plan. She was advised to call immediately if she has any concerning symptoms in the interval. The patient voices understanding of current disease status and treatment options and is in agreement with the current care plan.  All questions were answered. The patient knows to call the clinic with any problems, questions or concerns. We can certainly see the patient much sooner if necessary.  Thank you so much for allowing me to participate in the care of [facility]. I will continue to follow up the patient with you and assist in her care.  I spent 55 minutes counseling the patient face to face. The total  time spent in the appointment was 80 minutes.  Disclaimer: This note was dictated with voice recognition software. Similar sounding words can inadvertently be transcribed and may not be corrected upon review.   Eilleen Kempf December 14, 2018, 1:45 PM

## 2018-12-14 NOTE — Telephone Encounter (Signed)
Gave avs and calendar ° °

## 2018-12-15 ENCOUNTER — Other Ambulatory Visit: Payer: Self-pay | Admitting: *Deleted

## 2018-12-15 ENCOUNTER — Encounter: Payer: Self-pay | Admitting: *Deleted

## 2018-12-15 NOTE — Progress Notes (Signed)
Thoracic Location of Tumor / Histology:  stage IV (T2b, N3, M1 C) non-small cell lung cancer, adenocarcinoma presented with left suprahilar mass with postobstructive collapse of the left upper lobe in addition to mediastinal and left supraclavicular lymphadenopathy as well as malignant left pleural effusion with pleural-based metastasis and metastatic disease to the bone, right adrenal gland and brain diagnosed in February 2020.  Patient presented with symptoms of: The patient mentions that over the last 3 months she has been complaining of persistent productive cough.  She was seen by her primary care physician and treated for chronic bronchitis with several courses of antibiotics as well as steroids.  She has no improvement in her condition and chest x-ray was performed in November 02, 2018 and that showed left suprahilar airspace disease with suspicious mass in the AP window.  This was followed by CT scan of the chest on November 11, 2018 and that showed an irregular infiltrating mass noted in the medial left upper lobe which abuts and likely extends into the mediastinum.    Biopsies revealed: 12/06/18:  Diagnosis PLEURAL FLUID, LEFT (SPECIMEN 1 OF 1 COLLECTED 12/06/18): MALIGNANT CELLS CONSISTENT WITH METASTATIC LUNG ADENOCARCINOMA. SEE COMMENT. COMMENT: THE MALIGNANT CELLS ARE POSITIVE FOR CYTOKERATIN 7, NAPSIN-A, AND TTF-1. THERE IS FOCAL NON SPECIFIC STAINING FOR CYTOKERATIN 20. THE MALIGNANT CELLS ARE NEGATIVE FOR CDX-2, ESTROGEN RECEPTOR, GATA-3, GCDFP-15, CALRETININ, AND WT-1. THE FINDINGS ARE CONSISTENT WITH METASTATIC LUNG ADENOCARCINOMA.  Tobacco/Marijuana/Snuff/ETOH use: several years of intermittent smoking.  She has a history for smoking intermittently 1 pack/day for around 10-15 years.  She quit completely 5 years ago.  She has no history of alcohol or drug abuse  Tobacco Use  . Smoking status: Former Smoker    Packs/day: 0.50    Types: Cigarettes    Last attempt to quit:  06/03/2015    Years since quitting: 3.5  . Smokeless tobacco: Never Used  Substance Use Topics  . Alcohol use: No    Alcohol/week: 0.0 standard drinks  . Drug use: No     Past/Anticipated interventions by cardiothoracic surgery, if any: Per Dr. Lucianne Lei Trigt 12/13/18:  Impression: 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 I will see the patient back in 3 weeks with chest x-ray to see if the malignant effusion recurs and would need a Pleurx catheter.  Past/Anticipated interventions by medical oncology, if any: Per Dr. Julien Nordmann 12/14/18:  I recommended for the patient to send the tissue block to [facility] for molecular studies and PDL 1 expression.  In the meantime I will send blood test for EGFR mutation analysis. I will refer the patient to radiation oncology for palliative radiotherapy to the brain as well as the metastatic bone disease and the obstructive left suprahilar mass. I will refill the patient back to Dr. Prescott Gum for consideration of left Pleurx catheter placement for drainage of the recurrent left pleural effusion. For pain management the patient will continue her current treatment with oxycodone for now. For constipation she was advised to take MiraLAX on as-needed basis. For the vasogenic edema from the brain metastasis, I will start the patient on Decadron 4 mg p.o. twice daily and this dose will be tapered slowly by radiation oncology during her treatment. I will see the patient back for follow-up visit in around 3 weeks after the availability of the molecular study results for more detailed discussion of her treatment options including palliative care, systemic chemotherapy in combination with immunotherapy versus  targeted therapy if the patient has any actionable mutation.  Signs/Symptoms  Weight changes, if any: She lost around 6 pounds in the last 2 months.  Respiratory complaints, if any: She has no chest pain but has  shortness of breath with exertion which improved after the left thoracentesis  Hemoptysis, if any: She has no hemoptysis.  Pain issues, if any:  She continues to have lower back pain and she is currently on oxycodone.  SAFETY ISSUES:  Prior radiation? No  Pacemaker/ICD? No  Possible current pregnancy? No  Is the patient on methotrexate? No  Current Complaints / other details:  Pt presents today for initial consult with Dr. Sondra Come for Radiation Oncology. Pt is unaccompanied.   BP 129/82 (BP Location: Left Arm, Patient Position: Sitting)   Pulse 100   Temp 98.4 F (36.9 C) (Oral)   Resp 18   Ht _0  (1.727 m)   Wt 181 lb 12.8 oz (82.5 kg)   SpO2 92%   BMI 27.64 kg/m   Wt Readings from Last 3 Encounters:  12/16/18 181 lb 12.8 oz (82.5 kg)  12/14/18 181 lb 9.6 oz (82.4 kg)  12/13/18 182 lb (82.6 kg)     Per Dr. Julien Nordmann: I will refer the patient to radiation oncology for palliative radiotherapy to the brain as well as the metastatic bone disease and the obstructive left suprahilar mass.  Loma Sousa, RN BSN

## 2018-12-15 NOTE — Progress Notes (Signed)
Oncology Nurse Navigator Documentation  Oncology Nurse Navigator Flowsheets 12/15/2018  Navigator Location [facility]  Navigator Encounter Type Other/per Dr. Julien Nordmann, I requested pathology to send [facility] and PDL 1 on recent pathology yesterday.  Treatment Phase Pre-Tx/Tx Discussion  Barriers/Navigation Needs Coordination of Care  Interventions Coordination of Care  Coordination of Care Other  Acuity Level 1  Time Spent with Patient 15

## 2018-12-15 NOTE — Progress Notes (Signed)
The proposed treatment discussed in cancer conference 12/15/2018 is for discussion purpose only and not a binding recommendation.  The patient was not physically examined nor present for their treatment options.  Therefore, final treatment plan cannot be decided.

## 2018-12-16 ENCOUNTER — Ambulatory Visit
Admission: RE | Admit: 2018-12-16 | Discharge: 2018-12-16 | Disposition: A | Discharge status: Home / Self Care | Payer: Managed Care, Other (non HMO) | Source: Ambulatory Visit | Attending: Radiation Oncology | Admitting: Radiation Oncology

## 2018-12-16 ENCOUNTER — Other Ambulatory Visit: Payer: Self-pay | Admitting: Radiation Therapy

## 2018-12-16 ENCOUNTER — Other Ambulatory Visit: Payer: Self-pay

## 2018-12-16 ENCOUNTER — Encounter: Payer: Self-pay | Admitting: Radiation Oncology

## 2018-12-16 VITALS — BP 129/82 | HR 100 | Temp 98.4°F | Resp 18 | Ht 68.0 in | Wt 181.8 lb

## 2018-12-16 DIAGNOSIS — C782 Secondary malignant neoplasm of pleura: Secondary | ICD-10-CM | POA: Insufficient documentation

## 2018-12-16 DIAGNOSIS — C3492 Malignant neoplasm of unspecified part of left bronchus or lung: Secondary | ICD-10-CM

## 2018-12-16 DIAGNOSIS — K5900 Constipation, unspecified: Secondary | ICD-10-CM | POA: Insufficient documentation

## 2018-12-16 DIAGNOSIS — C7931 Secondary malignant neoplasm of brain: Secondary | ICD-10-CM

## 2018-12-16 DIAGNOSIS — Z79899 Other long term (current) drug therapy: Secondary | ICD-10-CM | POA: Insufficient documentation

## 2018-12-16 DIAGNOSIS — C7951 Secondary malignant neoplasm of bone: Secondary | ICD-10-CM

## 2018-12-16 DIAGNOSIS — Z87891 Personal history of nicotine dependence: Secondary | ICD-10-CM | POA: Insufficient documentation

## 2018-12-16 DIAGNOSIS — R0902 Hypoxemia: Secondary | ICD-10-CM | POA: Insufficient documentation

## 2018-12-16 DIAGNOSIS — Z79891 Long term (current) use of opiate analgesic: Secondary | ICD-10-CM | POA: Insufficient documentation

## 2018-12-16 DIAGNOSIS — Z7952 Long term (current) use of systemic steroids: Secondary | ICD-10-CM | POA: Insufficient documentation

## 2018-12-16 DIAGNOSIS — Z800 Family history of malignant neoplasm of digestive organs: Secondary | ICD-10-CM | POA: Insufficient documentation

## 2018-12-16 DIAGNOSIS — E039 Hypothyroidism, unspecified: Secondary | ICD-10-CM | POA: Insufficient documentation

## 2018-12-16 DIAGNOSIS — J910 Malignant pleural effusion: Secondary | ICD-10-CM | POA: Insufficient documentation

## 2018-12-16 DIAGNOSIS — M545 Low back pain: Secondary | ICD-10-CM | POA: Insufficient documentation

## 2018-12-16 DIAGNOSIS — Z510 Encounter for antineoplastic radiation therapy: Secondary | ICD-10-CM | POA: Diagnosis not present

## 2018-12-16 DIAGNOSIS — C7971 Secondary malignant neoplasm of right adrenal gland: Secondary | ICD-10-CM | POA: Insufficient documentation

## 2018-12-16 DIAGNOSIS — R590 Localized enlarged lymph nodes: Secondary | ICD-10-CM | POA: Insufficient documentation

## 2018-12-16 DIAGNOSIS — C7949 Secondary malignant neoplasm of other parts of nervous system: Principal | ICD-10-CM

## 2018-12-16 DIAGNOSIS — J189 Pneumonia, unspecified organism: Secondary | ICD-10-CM | POA: Insufficient documentation

## 2018-12-16 DIAGNOSIS — Z79890 Hormone replacement therapy (postmenopausal): Secondary | ICD-10-CM | POA: Insufficient documentation

## 2018-12-16 NOTE — Progress Notes (Signed)
Radiation Oncology         [phone] ________________________________  Initial Outpatient Consultation  Name: Tina Cruz MRN: [id number]  Date: 12/16/2018  DOB: 1960-12-28  QI:ONGEXB, Modena Nunnery, MD  [name], MD   REFERRING PHYSICIAN: [name], MD  DIAGNOSIS:  Stage IV (T2b, N3, M1 C) non-small cell lung cancer, adenocarcinoma with lymphadenopathy and metastases to bone, right adrenal gland, and brain  HISTORY OF PRESENT ILLNESS::Tina Cruz is a 58 y.o. female who seen out of the courtesy of Dr. Julien Nordmann for an opinion concerning radiation therapy as part of management of patient's recently diagnosed advanced non-small cell lung cancer. The patient presented to her PCP on 09/27/2018 with persistent productive cough since around 07/2018. She received 3 rounds of steroids and antibiotics with no improvement. She underwent chest x-ray during her appointment, with results revealing left suprahilar airspace disease with volume loss and suspicion of underlying mass in the AP window. This was followed by a chest CT scan on 11/11/2018, which revealed: irregular infiltrating mass in the medial left upper lobe abutting the left hilum and likely extending/invading the mediastinum, postobstructive atelectasis or pneumonia in the left upper lobe makes it difficult to accurately measure the mass, but approximately 4.2 x 3.2 cm; mediastinal adenopathy in the prevascular space and AP window, borderline sized low right paratracheal lymph node. She was referred to Dr. Prescott Gum on 12/01/2018 for surgical consultation.  The patient underwent left thoracentesis with cytology of pleural fluid on 12/06/2018 showing: malignant cells consistent with metastatic lung adenocarcinoma. She also underwent staging brain MRI on 12/07/2018 with results revealing: 14 mm metastatic deposit right frontal parietal lobe with surrounding edema; 2 mm left frontal lesion highly suspicious for metastatic  disease; multiple skull lesions suspicious for metastatic disease, left occipital skull lesion is very suspicious for metastatic disease. In addition, she underwent PET scan on 12/12/2018 showing: intensely hypermetabolic LEFT suprahilar mass consistent primary bronchogenic carcinoma, post obstructive collapse of the LEFT upper lobe; evidence of hypermetabolic nodal metastasis including the AP window and LEFT supraclavicular node, LEFT supraclavicular node may be amenable to biopsy; evidence of pleural metastasis in the LEFT hemithorax; multiple foci of hypermetabolic lytic skeletal metastasis in the spine, scapula, pelvis, and RIGHT femur; high concern for small RIGHT adrenal metastasis.  She met with Dr. Julien Nordmann on 12/14/2018, who recommended for the patient to send the tissue block to [facility] for molecular studies and PDL 1 expression. He will see the patient back for follow-up visit in around 3 weeks after the availability of the molecular study results for more detailed discussion of her treatment options including palliative care, systemic chemotherapy in combination with immunotherapy versus targeted therapy if the patient has any actionable mutation.   PREVIOUS RADIATION THERAPY: No  PAST MEDICAL HISTORY:  has a past medical history of Active smoker, Depression, H/O chronic bronchitis, and Thyroid disease.    PAST SURGICAL HISTORY: Past Surgical History:  Procedure Laterality Date  . TONSILLECTOMY    . TUBAL LIGATION      FAMILY HISTORY: family history includes Alcohol abuse in her father; Cancer in her mother.  SOCIAL HISTORY:  reports that she quit smoking about 3 years ago. Her smoking use included cigarettes. She smoked 0.50 packs per day. She has never used smokeless tobacco. She reports that she does not drink alcohol or use drugs.  ALLERGIES: Albuterol  MEDICATIONS:  Current Outpatient Medications  Medication Sig Dispense Refill  . azelastine (ASTELIN) 0.1 % nasal spray Place  1 spray into both nostrils 2 (two) times daily as needed for rhinitis. Use in each nostril as directed 30 mL 12  . Cetirizine HCl (ZYRTEC ALLERGY) 10 MG CAPS Take 1 capsule (10 mg total) by mouth daily as needed (allergies). 30 capsule 11  . cyclobenzaprine (FLEXERIL) 10 MG tablet Take 1 tablet (10 mg total) by mouth 3 (three) times daily as needed for muscle spasms. 30 tablet 0  . dexamethasone (DECADRON) 4 MG tablet Take 1 tablet (4 mg total) by mouth 2 (two) times daily. 30 tablet 1  . famotidine (PEPCID) 20 MG tablet Take 20 mg by mouth daily.    . Ibuprofen (ADVIL PO) Take 1 tablet by mouth as needed (219m OTC).    .Marland Kitchenlevalbuterol (XOPENEX HFA) 45 MCG/ACT inhaler Inhale 1-2 puffs into the lungs every 4 (four) hours as needed for wheezing or shortness of breath. 1 Inhaler 0  . levothyroxine (SYNTHROID, LEVOTHROID) 112 MCG tablet TAKE 1 TABLET (112 MCG TOTAL) BY MOUTH DAILY BEFORE BREAKFAST. 90 tablet 1  . oxyCODONE (OXY IR/ROXICODONE) 5 MG immediate release tablet Take 5 mg by mouth 2 (two) times daily.     No current facility-administered medications for this encounter.     REVIEW OF SYSTEMS:  A 10+ POINT REVIEW OF SYSTEMS WAS OBTAINED including neurology, dermatology, psychiatry, cardiac, respiratory, lymph, extremities, GI, GU, musculoskeletal, constitutional, reproductive, HEENT. She has lost around 6 pounds in the last 2 months. She reports shortness of breath with exertion and states this has improved since the left thoracentesis. She denies chest pain and hemoptysis. She reports continued mid/ lower back pain which radiates into the left flank region, and she is currently on oxycodone but continues to have difficulty sleeping at night in light of her pain. She reports her pain being constant.   PHYSICAL EXAM:  height is '5\' 8"'  (1.727 m) and weight is 181 lb 12.8 oz (82.5 kg). Her oral temperature is 98.4 F (36.9 C). Her blood pressure is 129/82 and her pulse is 100. Her respiration is 18  and oxygen saturation is 92%.   General: Alert and oriented, in no acute distress HEENT: Head is normocephalic. Extraocular movements are intact. Oropharynx is clear. Neck: Neck is supple, no palpable cervical or supraclavicular lymphadenopathy. Heart: Regular in rate and rhythm with no murmurs, rubs, or gallops. Chest: Clear to auscultation bilaterally, with no rhonchi, wheezes, or rales. Abdomen: Soft, nontender, nondistended, with no rigidity or guarding. Extremities: No cyanosis or edema. Lymphatics: see Neck Exam Skin: No concerning lesions. Musculoskeletal: symmetric strength and muscle tone throughout. Neurologic: Cranial nerves II through XII are grossly intact. No obvious focalities. Speech is fluent. Coordination is intact. Psychiatric: Judgment and insight are intact. Affect is appropriate. Her pain appears to be localized to the lower thoracic spine region with pain in the left lateral chest/left flank region.  ECOG = 1  0 - Asymptomatic (Fully active, able to carry on all predisease activities without restriction)  1 - Symptomatic but completely ambulatory (Restricted in physically strenuous activity but ambulatory and able to carry out work of a light or sedentary nature. For example, light housework, office work)  2 - Symptomatic, <50% in bed during the day (Ambulatory and capable of all self care but unable to carry out any work activities. Up and about more than 50% of waking hours)  3 - Symptomatic, >50% in bed, but not bedbound (Capable of only limited self-care, confined to bed or chair 50% or more of waking hours)  4 - Bedbound (Completely disabled. Cannot carry on any self-care. Totally confined to bed or chair)  5 - Death   Eustace Pen MM, Creech RH, Tormey DC, et al. [phone]). "Toxicity and response criteria of the [facility]". [place] Oncol. 5 (6): 649-55  LABORATORY DATA:  Lab Results  Component Value Date   WBC 13.4 (H) 12/14/2018    HGB 13.5 12/14/2018   HCT 39.4 12/14/2018   MCV 89.7 12/14/2018   PLT 370 12/14/2018   NEUTROABS 10.6 (H) 12/14/2018   Lab Results  Component Value Date   NA 138 12/14/2018   K 4.1 12/14/2018   CL 104 12/14/2018   CO2 25 12/14/2018   GLUCOSE 103 (H) 12/14/2018   CREATININE 0.85 12/14/2018   CALCIUM 9.7 12/14/2018      RADIOGRAPHY: Dg Chest 1 View  Result Date: 12/06/2018 CLINICAL DATA:  Post left thoracentesis. EXAM: CHEST  1 VIEW COMPARISON:  Chest radiograph 11/02/2018 FINDINGS: Aeration in left lung has minimally changed following the thoracentesis. Persistent opacity and volume loss in the left upper lobe compatible with the known lesion. Negative for a pneumothorax. Right lung is clear. Cardiomediastinal silhouette is unchanged. Stable blunting at the left costophrenic angle. IMPRESSION: 1. Negative for pneumothorax following the left thoracentesis. 2. Aeration in the left lung has not significantly changed. Persistent opacity and volume loss in the left upper lobe. Electronically Signed   By: Markus Daft M.D.   On: 12/06/2018 12:20   Mr Jeri Cos LN Contrast  Result Date: 12/07/2018 CLINICAL DATA:  Solitary pulmonary nodule.  Staging lung cancer EXAM: MRI HEAD WITHOUT AND WITH CONTRAST TECHNIQUE: Multiplanar, multiecho pulse sequences of the brain and surrounding structures were obtained without and with intravenous contrast. CONTRAST:  8 mL Gadovist IV COMPARISON:  None. FINDINGS: Brain: 14 mm enhancing mass in the right frontal parietal lobe compatible with metastatic disease. Mild to moderate surrounding vasogenic edema. This is near the motor strip. Subtle 2 mm enhancing nodule left frontal cortex also likely due to metastatic disease. Ventricle size normal. No midline shift. Negative for hemorrhage. No acute infarct. Minimal chronic white matter changes. Vascular: Normal arterial flow voids Skull and upper cervical spine: Multiple skull lesions are present suspicious for metastatic  disease. The most definitive is in the left occipital bone which shows restricted diffusion and enhancement. Other lesions are suspicious. Sinuses/Orbits: Paranasal sinuses clear. Right mastoid effusion. Normal orbit bilaterally. Other: None IMPRESSION: 14 mm metastatic deposit right frontal parietal lobe with surrounding edema. 2 mm left frontal lesion highly suspicious for metastatic disease Multiple skull lesions suspicious for metastatic disease. Left occipital skull lesion is very suspicious for metastatic disease These results will be called to the ordering clinician or representative by the Radiologist Assistant, and communication documented in the PACS or zVision Dashboard. Electronically Signed   By: Franchot Gallo M.D.   On: 12/07/2018 16:59   Nm Pet Image Initial (pi) Skull Base To Thigh  Result Date: 12/12/2018 CLINICAL DATA:  Initial treatment strategy for LEFT upper lobe mass EXAM: NUCLEAR MEDICINE PET SKULL BASE TO THIGH TECHNIQUE: Nine point mCi F-18 FDG was injected intravenously. Full-ring PET imaging was performed from the skull base to thigh after the radiotracer. CT data was obtained and used for attenuation correction and anatomic localization. Fasting blood glucose: 94 mg/dl COMPARISON:  CT 11/11/2018 FINDINGS: Mediastinal blood pool activity: SUV max 2.5 NECK: Hypermetabolic LEFT supraclavicular node with SUV max equal 7.4 measures 10 mm short axis (image 55/4) Incidental CT  findings: None CHEST: A partially calcified hypermetabolic LEFT suprahilar mass surrounds the LEFT upper lobe bronchus. Mass is intensely hypermetabolic with SUV max equal 12.9. Mass measures approximately 3.5 x 2.5 cm. Post obstructive segmental collapse of the LEFT upper lobe. There is hypermetabolic nodal extension to the mediastinum with a hypermetabolic AP window node which difficult define on the CT portion but evident on the PET data. Along the most inferior aspect of the pleural surface of the LEFT hemithorax  there is hypermetabolic rim of metabolic activity (image 94 for example). Large LEFT effusion. Incidental CT findings: Large LEFT pleural effusion ABDOMEN/PELVIS: Focal metabolic activity of the RIGHT adrenal gland with SUV max equal 6.3. Minimal enlargement the body to 8 mm. LEFT adrenal gland appears normal. No abnormal activity in liver. No hypermetabolic abdominopelvic lymph nodes. Incidental CT findings: none SKELETON: Multiple foci of intense focal metabolic activity within the spine, sacrum pelvis and including the RIGHT femur and LEFT scapula. Example lesion in the transverse process of the T2 vertebral body on the LEFT with SUV max equal 11.9. There is destructive lesion at this level. Example lesion in the LEFT acetabulum with SUV max equal 15.0. Subtle lucent lesion. Example lesion in the L 3 vertebral body with SUV max equal 17.9. Subtle lytic lesion measuring 2 cm this level. Incidental CT findings: No pathologic fracture IMPRESSION: 1. Intensely hypermetabolic LEFT suprahilar mass consistent primary bronchogenic carcinoma. Post obstructive collapse of the LEFT upper lobe. 2. Evidence of hypermetabolic nodal metastasis including the AP window and LEFT supraclavicular node. LEFT supraclavicular node may be amenable to biopsy. 3. Evidence of pleural metastasis in the LEFT hemithorax. Large LEFT pleural effusion. 4. Multiple foci of hypermetabolic lytic skeletal metastasis in the spine, scapula, pelvis, and RIGHT femur. 5. High concern for small RIGHT adrenal metastasis. Electronically Signed   By: Suzy Bouchard M.D.   On: 12/12/2018 10:15   US Thoracentesis Asp Pleural Space W/img Guide  Result Date: 12/06/2018 INDICATION: Left upper lobe mass with extension into the mediastinum. Pleural effusion. Request for diagnostic and therapeutic thoracentesis. EXAM: ULTRASOUND GUIDED LEFT THORACENTESIS MEDICATIONS: 1% lidocaine 10 mL COMPLICATIONS: None immediate. PROCEDURE: An ultrasound guided  thoracentesis was thoroughly discussed with the patient and questions answered. The benefits, risks, alternatives and complications were also discussed. The patient understands and wishes to proceed with the procedure. Written consent was obtained. Ultrasound was performed to localize and mark an adequate pocket of fluid in the left chest. The area was then prepped and draped in the normal sterile fashion. 1% Lidocaine was used for local anesthesia. Under ultrasound guidance a 6 Fr Safe-T-Centesis catheter was introduced. Thoracentesis was performed. The catheter was removed and a dressing applied. FINDINGS: A total of approximately 1.5 L of hazy yellow fluid was removed. Samples were sent to the laboratory as requested by the clinical team. IMPRESSION: Successful ultrasound guided left thoracentesis yielding 1.5 L of pleural fluid. No pneumothorax on post-procedure chest x-ray. Read by: Gareth Eagle, PA-C Electronically Signed   By: Jerilynn Mages.  Shick M.D.   On: 12/06/2018 13:15      IMPRESSION: Stage IV (T2b, N3, M1 C) non-small cell lung cancer, adenocarcinoma with lymphadenopathy and metastases to bone, right adrenal gland, and brain. Patient is symptomatic from her osseous metastasis/ left pleural metastasis and would be at good candidate for palliative treatments to this area. She also has a obstructing lung lesion with postobstructive pneumonia and would be at risk for postobstructive pneumonia. I would recommend radiation treatments to the left dominant  chest mass. Patient would also appear to be a good candidate for stereotactic radiosurgery directed at her brain metastasis.   Today, I talked to the patient and  about the findings and work-up thus far.  We discussed the natural history of lung cancer and general treatment, highlighting the role of radiotherapy in the management.  We discussed the available radiation techniques, and focused on the details of logistics and delivery.  We reviewed the anticipated  acute and late sequelae associated with radiation in this setting.  The patient was encouraged to ask questions that I answered to the best of my ability.  A patient consent form was discussed and signed.  We retained a copy for our records.  The patient would like to proceed with radiation and will be scheduled for CT simulation this afternoon for treatments to the left lung mass as well as her lower thoracic spine/pleural metastasis.  PLAN:CT simulation later today for palliative radiation therapy directed at the left lung mass which is causing partial collapse and postobstructive pneumonitis the left upper lung. She will also undergo simulation for treatments to her lower thoracic spine and left pleural lesions. The Patient did meet with the stereotactic radiosurgery coordinator and the patient will be scheduled for 3T MRI for further evaluation of her brain metastasis. She will meet with Dr. Eppie Gibson in the near future for detailed evaluation concerning stereotactic radiosurgery. She will start of conventional radiation therapy next week. Anticipate 3 weeks of radiation treatment.     ------------------------------------------------  Blair Promise, PhD, MD  This document serves as a record of services personally performed by Gery Pray, MD. It was created on his behalf by Wilburn Mylar, a trained medical scribe. The creation of this record is based on the scribe's personal observations and the provider's statements to them. This document has been checked and approved by the attending provider.

## 2018-12-16 NOTE — Progress Notes (Signed)
  Radiation Oncology         [phone] ________________________________  Name: Tina Cruz MRN: [id number]  Date: 12/16/2018  DOB: 11/02/1960  SIMULATION AND TREATMENT PLANNING NOTE    ICD-10-CM   1. Adenocarcinoma of left lung, stage 4 (HCC) C34.92   2. Bone metastasis (HCC) C79.51     DIAGNOSIS:  Stage IV (T2b, N3, M1 C) non-small cell lung cancer, adenocarcinoma with lymphadenopathy and metastases to bone, right adrenal gland, and brain  NARRATIVE:  The patient was brought to the [facility].  Identity was confirmed.  All relevant records and images related to the planned course of therapy were reviewed.  The patient freely provided informed written consent to proceed with treatment after reviewing the details related to the planned course of therapy. The consent form was witnessed and verified by the simulation staff.  Then, the patient was set-up in a stable reproducible  supine position for radiation therapy.  CT images were obtained.  Surface markings were placed.  The CT images were loaded into the planning software.  Then the target and avoidance structures were contoured.  Treatment planning then occurred.  The radiation prescription was entered and confirmed.  Then, I designed and supervised the construction of a total of 8 medically necessary complex treatment devices.  I have requested : 3D Simulation  I have requested a DVH of the following structures: heart, lungs, GTV, PTV.  I have ordered:CBC  PLAN:  The patient will receive 35 Gy in 14 fractions directed at the left upper lung mass. The second area of treatment will be the left pleural-based metastasis/rib metastasis as well as osseous metastasis in the lower thoracic spine region. This area will receive 35 gray in 14 fractions.  -----------------------------------  Blair Promise, PhD, MD  This document serves as a record of services personally performed by Gery Pray, MD. It was created on his  behalf by Wilburn Mylar, a trained medical scribe. The creation of this record is based on the scribe's personal observations and the provider's statements to them. This document has been checked and approved by the attending provider.

## 2018-12-20 DIAGNOSIS — Z510 Encounter for antineoplastic radiation therapy: Secondary | ICD-10-CM | POA: Diagnosis not present

## 2018-12-21 ENCOUNTER — Ambulatory Visit
Admission: RE | Admit: 2018-12-21 | Discharge: 2018-12-21 | Disposition: A | Discharge status: Home / Self Care | Payer: Managed Care, Other (non HMO) | Source: Ambulatory Visit | Attending: Radiation Oncology | Admitting: Radiation Oncology

## 2018-12-21 DIAGNOSIS — Z510 Encounter for antineoplastic radiation therapy: Secondary | ICD-10-CM | POA: Diagnosis not present

## 2018-12-21 DIAGNOSIS — C3492 Malignant neoplasm of unspecified part of left bronchus or lung: Secondary | ICD-10-CM

## 2018-12-21 NOTE — Progress Notes (Signed)
Location/Histology of Brain Tumor:  12/07/18 MRI brain -14 mm metastatic deposit right frontal parietal lobe with surrounding edema. -2 mm left frontal lesion highly suspicious for metastatic disease Multiple skull lesions suspicious for metastatic disease. Left occipital skull lesion is very suspicious for metastatic disease  Patient presented with symptoms of:  There were no specific symptoms related to the brain metastasis.   Past or anticipated interventions, if any, per neurosurgery:  None  Past or anticipated interventions, if any, per medical oncology:  12/14/18 Dr. Julien Nordmann PLAN: I had a lengthy discussion with the patient today about her current disease stage, prognosis and treatment options. I personally and independently reviewed her scan images and discussed the result and showed the images to the patient today. I explained to the patient that she has incurable condition and all the treatment will be of palliative nature. I recommended for the patient to send the tissue block to [facility] for molecular studies and PDL 1 expression.  In the meantime I will send blood test for EGFR mutation analysis. I will refer the patient to radiation oncology for palliative radiotherapy to the brain as well as the metastatic bone disease and the obstructive left suprahilar mass. I will refill the patient back to Dr. Prescott Gum for consideration of left Pleurx catheter placement for drainage of the recurrent left pleural effusion. For pain management the patient will continue her current treatment with oxycodone for now. For constipation she was advised to take MiraLAX on as-needed basis. For the vasogenic edema from the brain metastasis, I will start the patient on Decadron 4 mg p.o. twice daily and this dose will be tapered slowly by radiation oncology during her treatment. I will see the patient back for follow-up visit in around 3 weeks after the availability of the molecular study results for  more detailed discussion of her treatment options including palliative care, systemic chemotherapy in combination with immunotherapy versus targeted therapy if the patient has any actionable mutation. The patient agreed to the current plan. She was advised to call immediately if she has any concerning symptoms in the interval. The patient voices understanding of current disease status and treatment options and is in agreement with the current care plan.  Dose of Decadron, if applicable: 4 mg twice daily, to be tapered by radiation oncology.   Recent neurologic symptoms, if any:   Seizures: no  Headaches: She reports occasional headaches behind left eye.   Nausea: Yes, she believes it is related to pain medicine.   Dizziness/ataxia: No  Difficulty with hand coordination: No  Focal numbness/weakness: No  Visual deficits/changes: She denies.   Confusion/Memory deficits: She denies.   Painful bone metastases at present, if any: A pleural-based metastasis/rib metastasis and osseous metastasis in the lower thoracic spine region. Radiation is planned by Dr. Sondra Come to these areas.  SAFETY ISSUES:  Prior radiation? No  Pacemaker/ICD? No  Possible current pregnancy? No  Is the patient on methotrexate? No  Additional Complaints / other details:  12/25/18 MRI brain scheduled.   BP 113/74   Pulse 85   Temp 98.3 F (36.8 C) (Oral)   Wt 176 lb (79.8 kg)   SpO2 97% Comment: room air  BMI 26.76 kg/m    Wt Readings from Last 3 Encounters:  12/27/18 176 lb (79.8 kg)  12/27/18 176 lb (79.8 kg)  12/16/18 181 lb 12.8 oz (82.5 kg)    Dr. Clabe Seal simulation note 12/16/18: The patient will receive 35 Gy in 14 fractions directed at  the left upper lung mass. The second area of treatment will be the left pleural-based metastasis/rib metastasis as well as osseous metastasis in the lower thoracic spine region. This area will receive 35 gray in 14 fractions.

## 2018-12-21 NOTE — Progress Notes (Signed)
  Radiation Oncology         ([phone]) [phone] ________________________________  Name: Tina Cruz MRN: [id number]  Date: 12/21/2018  DOB: 1961-04-22  Simulation Verification Note    ICD-10-CM   1. Adenocarcinoma of left lung, stage 4 (HCC) C34.92     Status: outpatient  NARRATIVE: The patient was brought to the treatment unit and placed in the planned treatment position. The clinical setup was verified. Then port films were obtained and uploaded to the radiation oncology medical record software.  The treatment beams were carefully compared against the planned radiation fields. The position location and shape of the radiation fields was reviewed. They targeted volume of tissue appears to be appropriately covered by the radiation beams. Organs at risk appear to be excluded as planned.  Based on my personal review, I approved the simulation verification. The patient's treatment will proceed as planned.  -----------------------------------  Blair Promise, PhD, MD

## 2018-12-22 ENCOUNTER — Ambulatory Visit
Admission: RE | Admit: 2018-12-22 | Discharge: 2018-12-22 | Disposition: A | Discharge status: Home / Self Care | Payer: Managed Care, Other (non HMO) | Source: Ambulatory Visit | Attending: Radiation Oncology | Admitting: Radiation Oncology

## 2018-12-22 ENCOUNTER — Encounter: Payer: Self-pay | Admitting: General Practice

## 2018-12-22 DIAGNOSIS — Z510 Encounter for antineoplastic radiation therapy: Secondary | ICD-10-CM | POA: Diagnosis not present

## 2018-12-22 NOTE — Progress Notes (Signed)
[facility] Psychosocial Distress Screening Clinical Social Work  Clinical Social Work was referred by distress screening protocol.  The patient scored a 5 on the Psychosocial Distress Thermometer which indicates moderate distress. Clinical Social Worker contacted patient by phone to assess for distress and other psychosocial needs. "I am hanging in there, managing pain is the most difficult thing for me."  "I have left sided pain all the time, not sleeping well" due to pain.  Has "lots of support", "great family."  CSW and patient discussed common feeling and emotions when being diagnosed with cancer, and the importance of support during treatment. CSW informed patient of the support team and support services at [facility]. CSW provided contact information and encouraged patient to call with any questions or concerns.  Will mail information packet, encouraged her to access resources as needed.    ONCBCN DISTRESS SCREENING 12/16/2018  Screening Type Initial Screening  Distress experienced in past week (1-10) 5  Emotional problem type Nervousness/Anxiety  Information Concerns Type Lack of info about complementary therapy choices;Lack of info about maintaining fitness  Physical Problem type Pain;Breathing;Loss of appetitie;Constipation/diarrhea    Clinical Social Worker follow up needed: No.  If yes, follow up plan:  Beverely Pace, [place], LCSW Clinical Social Worker Phone:  [phone]

## 2018-12-23 ENCOUNTER — Other Ambulatory Visit: Payer: Self-pay

## 2018-12-23 ENCOUNTER — Ambulatory Visit
Admission: RE | Admit: 2018-12-23 | Discharge: 2018-12-23 | Disposition: A | Discharge status: Home / Self Care | Payer: Managed Care, Other (non HMO) | Source: Ambulatory Visit | Attending: Radiation Oncology | Admitting: Radiation Oncology

## 2018-12-23 DIAGNOSIS — Z510 Encounter for antineoplastic radiation therapy: Secondary | ICD-10-CM | POA: Diagnosis not present

## 2018-12-25 ENCOUNTER — Ambulatory Visit
Admission: RE | Admit: 2018-12-25 | Discharge: 2018-12-25 | Disposition: A | Discharge status: Home / Self Care | Payer: Managed Care, Other (non HMO) | Source: Ambulatory Visit | Attending: Radiation Oncology | Admitting: Radiation Oncology

## 2018-12-25 ENCOUNTER — Other Ambulatory Visit: Payer: Self-pay

## 2018-12-25 DIAGNOSIS — C7949 Secondary malignant neoplasm of other parts of nervous system: Principal | ICD-10-CM

## 2018-12-25 DIAGNOSIS — C7931 Secondary malignant neoplasm of brain: Secondary | ICD-10-CM

## 2018-12-25 IMAGING — MR MRI HEAD WITHOUT AND WITH CONTRAST
11 series · 48 of 48 positions shown · IV contrast (multihance)
Comparison: [DATE]

CLINICAL DATA: S RS brain study. Treatment planning for new
diagnosis of non-small cell lung cancer. Right parietal metastasis
shown on previous exam. Calvarial metastases shown on previous exam.
Left frontal metastasis on previous exam.

EXAM:
MRI HEAD WITHOUT AND WITH CONTRAST
TECHNIQUE: Multiplanar, multiecho pulse sequences of the brain and surrounding
structures were obtained without and with intravenous contrast.
CONTRAST:  15mL MULTIHANCE GADOBENATE DIMEGLUMINE 529 MG/ML IV SOLN

[Series 2: FLAIR · sagittal · 3.0mm · 0.75mm/px · 2 of 39 slices shown (1 of 2)]
[im 1/39]
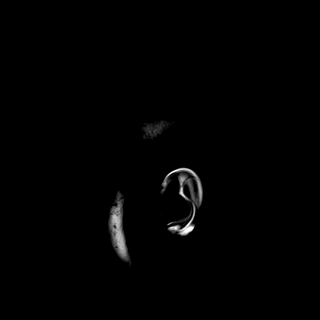
[im 39/39]
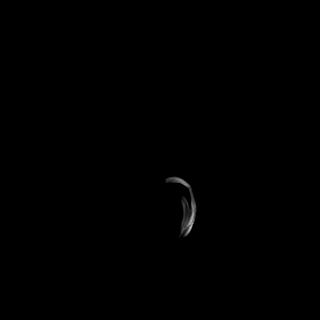

[Series 3: DWI · axial · 3.0mm · 1.50mm/px · z∈[-66,+83]mm · 5 of 78 slices shown (1 of 2)]
[im 1/78]
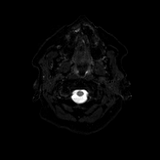
[im 20/78]
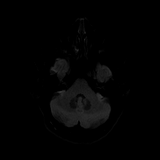
[im 39/78]
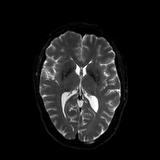
[im 58/78]
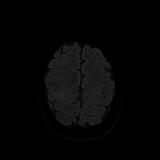
[im 78/78]
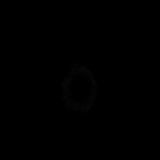

[Series 4: DWI · axial · 3.0mm · 1.50mm/px · z∈[-66,+83]mm · 2 of 38 slices shown (2 of 2)]
[im 1/38]
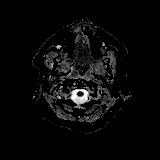
[im 38/38]
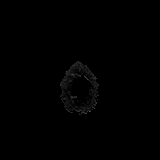

[Series 5: T2 · axial · 5.0mm · 0.60mm/px · z∈[-70,+86]mm · 2 of 27 slices shown]
[im 1/27]
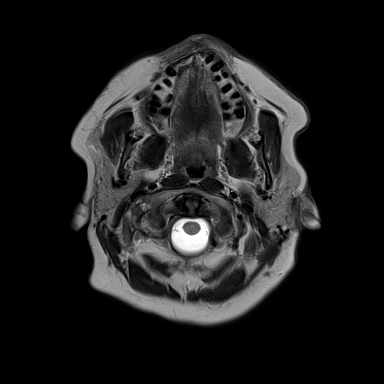
[im 27/27]
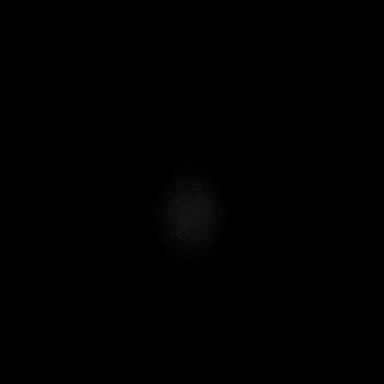

[Series 7: swi_images · axial · 1.5mm · 0.90mm/px · z∈[-63,+80]mm · 6 of 96 slices shown]
[im 1/96]
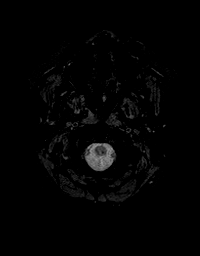
[im 20/96]
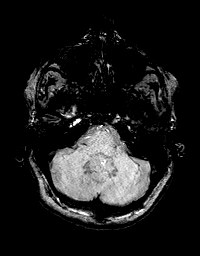
[im 39/96]
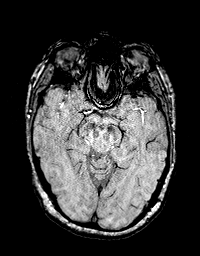
[im 58/96]
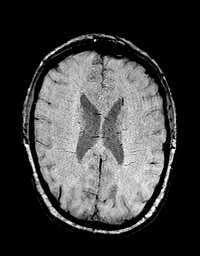
[im 77/96]
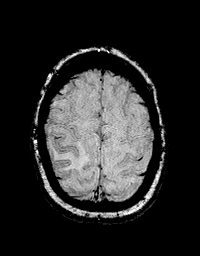
[im 96/96]
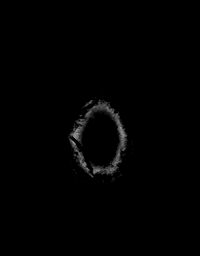

[Series 8: FLAIR · axial · 3.0mm · 0.57mm/px · z∈[-68,+85]mm · 3 of 52 slices shown (2 of 2)]
[im 1/52]
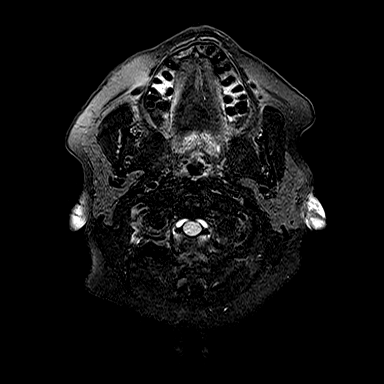
[im 26/52]
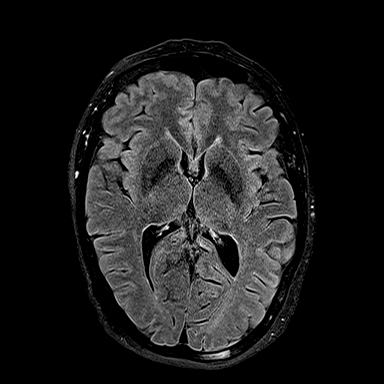
[im 52/52]
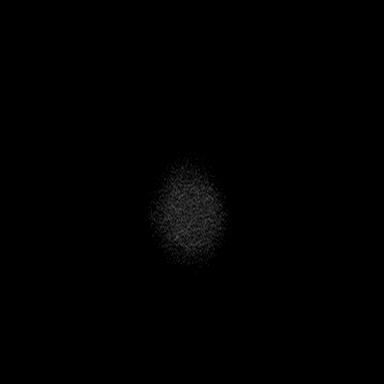

[Series 9: T1 · axial · 1.0mm · 0.75mm/px · z∈[-77,+82]mm · 10 of 160 slices shown]
[im 1/160]
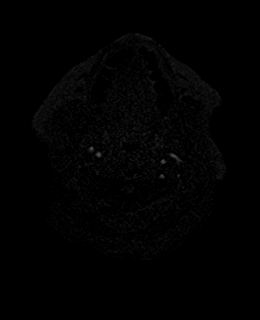
[im 18/160]
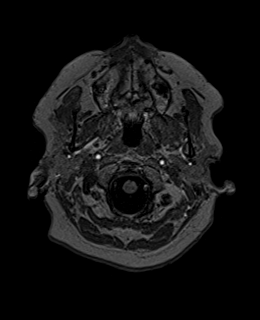
[im 36/160]
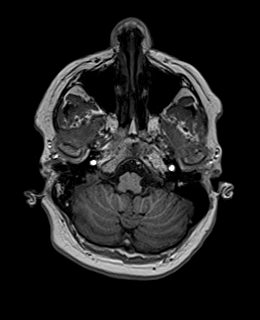
[im 54/160]
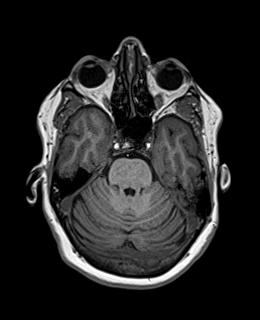
[im 71/160]
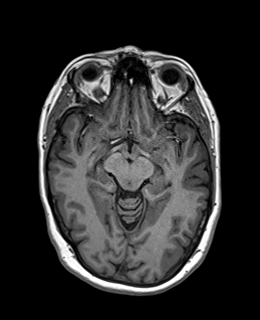
[im 89/160]
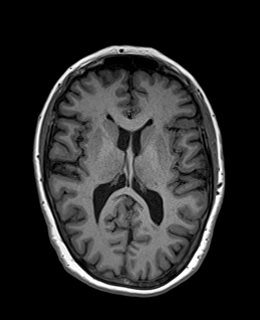
[im 107/160]
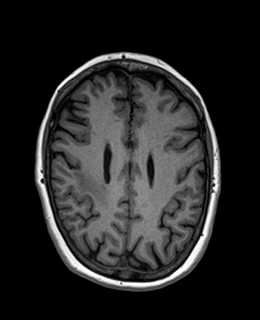
[im 124/160]
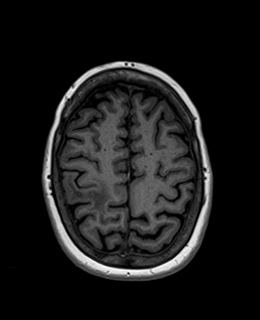
[im 142/160]
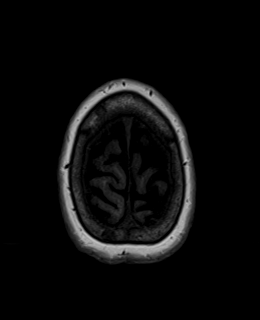
[im 160/160]
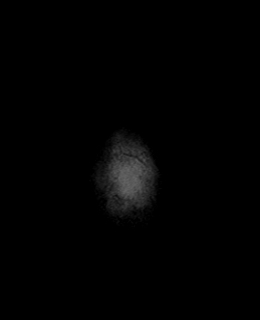

[Series 10: T2 post-contrast · coronal · 3.0mm · 0.57mm/px · 3 of 47 slices shown]
[im 1/47]
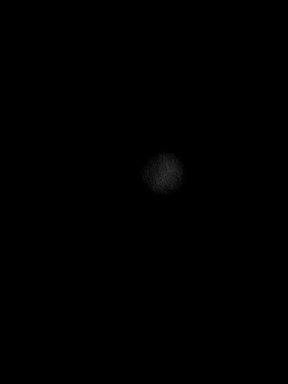
[im 24/47]
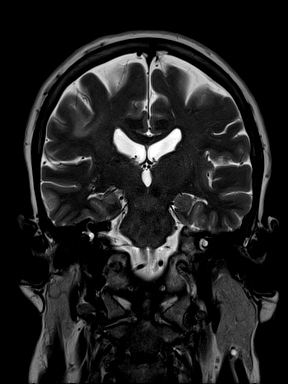
[im 47/47]
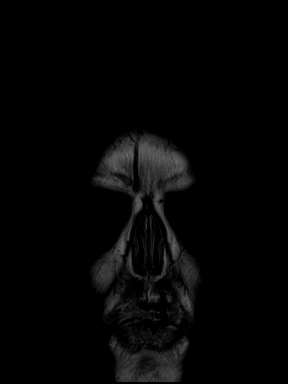

[Series 11: T1 post-contrast · axial · 1.0mm · 0.75mm/px · z∈[-75,+84]mm · 10 of 160 slices shown (1 of 2)]
[im 1/160]
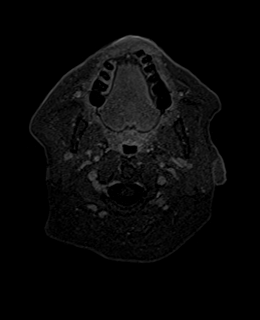
[im 18/160]
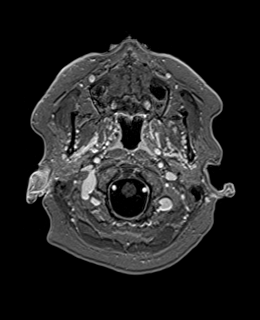
[im 36/160]
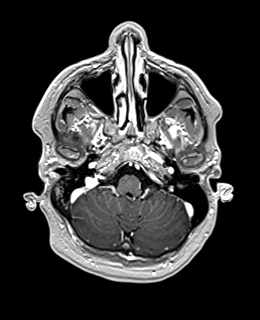
[im 54/160]
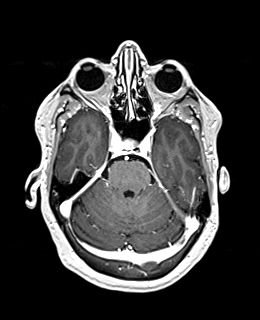
[im 71/160]
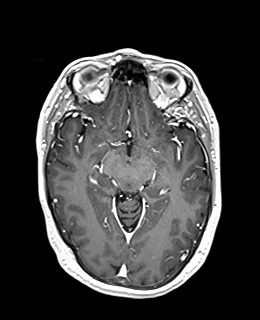
[im 89/160]
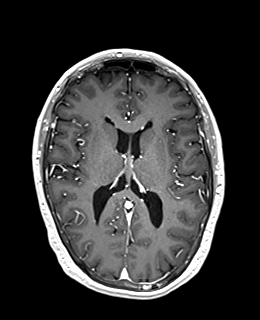
[im 107/160]
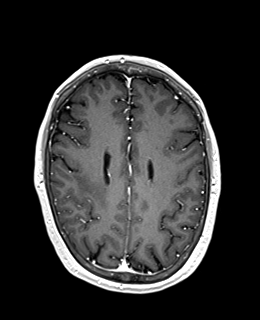
[im 124/160]
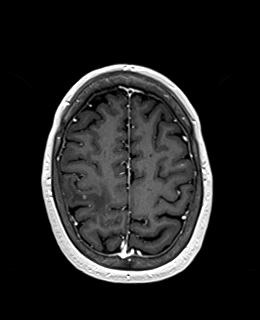
[im 142/160]
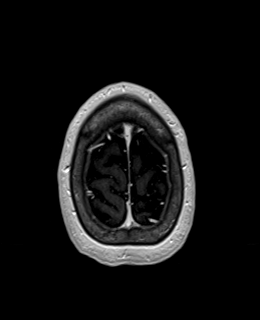
[im 160/160]
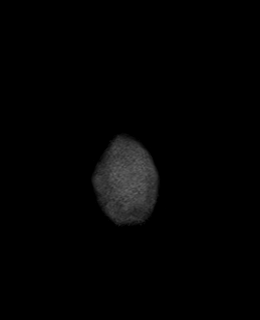

[Series 12: T1 post-contrast · coronal · 3.0mm · 0.57mm/px · 3 of 47 slices shown (2 of 2)]
[im 1/47]
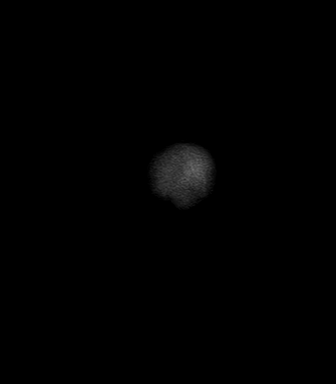
[im 24/47]
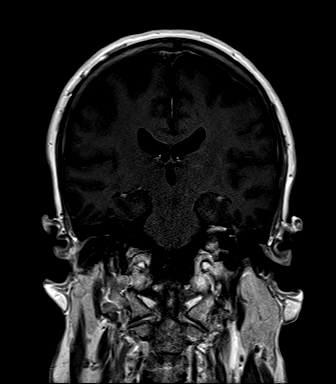
[im 47/47]
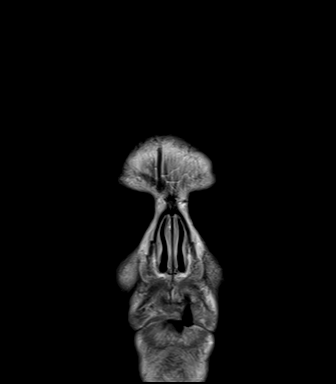

[Series 13: FLAIR post-contrast · sagittal · 3.0mm · 0.75mm/px · 2 of 39 slices shown]
[im 1/39]
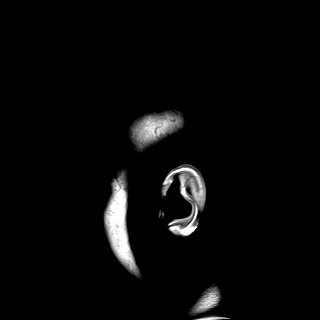
[im 39/39]
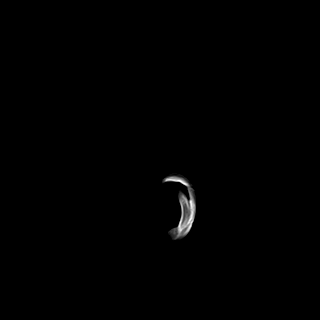

[48 of 48 positions shown; findings below may reference images not displayed]

FINDINGS: Brain: The brainstem and cerebellum are normal. Cerebral hemispheres
show minimal small vessel change of the white matter without
significant ischemic changes. There is a 13 x 14 mm metastasis in
what probably represents the precentral gyrus of the posterior
frontal lobe. There is moderate surrounding edema. There is a second
probable metastasis in the left frontal lobe, axial image 92,
measuring 2-3 mm. There is an adjacent vessel, but I think this is
probably a small metastasis. No third lesion is identified. No
evidence of hydrocephalus or extra-axial collection.

Vascular: Major vessels at the base of the brain show flow.

Skull and upper cervical spine: Multiple calvarial metastases as
noted by arrows.

Sinuses/Orbits: Clear/normal

Other: None
IMPRESSION: 13 x 14 mm metastasis with surrounding vasogenic edema in the
posterior right frontal lobe axial image 117.

Probable 2-3 mm metastasis in the left frontal lobe axial image 92.

Multiple calvarial metastases as marked by arrows.

## 2018-12-25 MED ORDER — GADOBENATE DIMEGLUMINE 529 MG/ML IV SOLN
15.0000 mL | Freq: Once | INTRAVENOUS | Status: AC | PRN
  Administered 2018-12-25: 15 mL via INTRAVENOUS

## 2018-12-26 ENCOUNTER — Other Ambulatory Visit: Payer: Self-pay

## 2018-12-26 ENCOUNTER — Inpatient Hospital Stay: Payer: Managed Care, Other (non HMO)

## 2018-12-26 ENCOUNTER — Ambulatory Visit
Admission: RE | Admit: 2018-12-26 | Discharge: 2018-12-26 | Disposition: A | Discharge status: Home / Self Care | Payer: Managed Care, Other (non HMO) | Source: Ambulatory Visit | Attending: Radiation Oncology | Admitting: Radiation Oncology

## 2018-12-26 DIAGNOSIS — Z510 Encounter for antineoplastic radiation therapy: Secondary | ICD-10-CM | POA: Diagnosis not present

## 2018-12-26 LAB — EPIDERMAL GROWTH FACTOR RECEPTOR (EGFR) MUTATION ANALYSIS

## 2018-12-26 NOTE — Progress Notes (Signed)
Radiation Oncology         [phone] ________________________________  Outpatient Re-Consultation  Name: Tina Cruz MRN: [id number]  Date: 12/27/2018  DOB: 09/02/1961  ZS:WFUXNA, Modena Nunnery, MD  Gery Pray, MD   REFERRING PHYSICIAN: Gery Pray, MD  DIAGNOSIS: Stage IV (T2b, N3, M1 C) non-small cell lung cancer, adenocarcinoma with lymphadenopathy and metastases to bone, right adrenal gland, and brain.     ICD-10-CM   1. Brain metastasis ([place]) C79.31     HISTORY OF PRESENT ILLNESS::Tina Cruz is a 58 y.o. female who is seen at the request of Dr. Julien Nordmann for metastatic lung cancer.  On 12/06/2018 the patient was diagnosed with metastatic lung adenocarcinoma. She initially presented to her PCP in December 2019 with persistent cough that did not get better after 3 rounds of steroids and antibiotics. Chest x-ray and subsequent chest CT performed in January 2020 revealed a left suprahilar mass with postobstructive collapse of the left upper lobe, mediastinal and left supraclavicular lymphadenopathy, and left pleural effusion. Cytology of the pleural fluid revealed metastatic lung cancer.   MRI of the brain on 12/07/2018 demonstrated: 14 mm metastatic deposit right frontal parietal lobe with surrounding edema; 2 mm left frontal lesion highly suspicious for metastatic disease; multiple skull lesions suspicious for metastatic disease, left occipital skull lesion especially. I reviewed her images w/ CNS tumor board.  There were no specific symptoms related to the brain metastasis per patient at presentation. Recent neurologic symptoms, if any:   Seizures: no  Headaches: She reports occasional headaches behind left eye.   Nausea: Yes, she believes it is related to pain medicine.   Dizziness/ataxia: No  Difficulty with hand coordination: No  Focal numbness/weakness: No  Visual deficits/changes: She denies.   Confusion/Memory deficits: She denies.   Painful bone  metastases at present, if any: A pleural-based metastasis/rib metastasis and osseous metastasis in the lower thoracic spine region. Radiation is planned by Dr. Sondra Come to these areas.  SAFETY ISSUES:  Prior radiation? No  Pacemaker/ICD? No  Possible current pregnancy? No Is the patient on methotrexate? No  Currently on Dexamethasone 4mg  BID  PREVIOUS RADIATION THERAPY: No  PAST MEDICAL HISTORY:  has a past medical history of Active smoker, Depression, H/O chronic bronchitis, and Thyroid disease.    PAST SURGICAL HISTORY: Past Surgical History:  Procedure Laterality Date   TONSILLECTOMY     TUBAL LIGATION      FAMILY HISTORY: family history includes Alcohol abuse in her father; Cancer in her mother.  SOCIAL HISTORY:  reports that she quit smoking about 3 years ago. Her smoking use included cigarettes. She smoked 0.50 packs per day. She has never used smokeless tobacco. She reports that she does not drink alcohol or use drugs.  ALLERGIES: Albuterol  MEDICATIONS:  Current Outpatient Medications  Medication Sig Dispense Refill   azelastine (ASTELIN) 0.1 % nasal spray Place 1 spray into both nostrils 2 (two) times daily as needed for rhinitis. Use in each nostril as directed 30 mL 12   Cetirizine HCl (ZYRTEC ALLERGY) 10 MG CAPS Take 1 capsule (10 mg total) by mouth daily as needed (allergies). 30 capsule 11   cyclobenzaprine (FLEXERIL) 10 MG tablet Take 1 tablet (10 mg total) by mouth 3 (three) times daily as needed for muscle spasms. 30 tablet 0   dexamethasone (DECADRON) 4 MG tablet Take 1 tablet (4 mg total) by mouth 2 (two) times daily. 30 tablet 1   famotidine (PEPCID) 20 MG tablet Take 20 mg by  mouth daily.     Ibuprofen (ADVIL PO) Take 1 tablet by mouth as needed (220mg  OTC).     levalbuterol (XOPENEX HFA) 45 MCG/ACT inhaler Inhale 1-2 puffs into the lungs every 4 (four) hours as needed for wheezing or shortness of breath. 1 Inhaler 0   levothyroxine (SYNTHROID,  LEVOTHROID) 112 MCG tablet TAKE 1 TABLET (112 MCG TOTAL) BY MOUTH DAILY BEFORE BREAKFAST. 90 tablet 1   oxyCODONE (OXY IR/ROXICODONE) 5 MG immediate release tablet Take 5 mg by mouth 2 (two) times daily.     LORazepam (ATIVAN) 1 MG tablet Take 1 tablet about 30 min before wearing mask for brain treatment. Must have a driver after taking this. Take 1 tab PRN before MRIs in the future, if feeling anxious. 3 tablet 0   No current facility-administered medications for this encounter.     REVIEW OF SYSTEMS:  A 10+ POINT REVIEW OF SYSTEMS WAS OBTAINED including neurology, dermatology, psychiatry, cardiac, respiratory, lymph, extremities, GI, GU, Musculoskeletal, constitutional, breasts, reproductive, HEENT.  All pertinent positives are noted in the HPI.  All others are negative.    PHYSICAL EXAM:  weight is 176 lb (79.8 kg). Her oral temperature is 98.3 F (36.8 C). Her blood pressure is 113/74 and her pulse is 85. Her oxygen saturation is 97%.   General: Alert and oriented, in no acute distress HEENT: Head is normocephalic. Extraocular movements are intact. Oropharynx is clear. Extremities: No cyanosis or edema. Lymphatics: see Neck Exam Skin: No concerning lesions. Musculoskeletal: symmetric strength and muscle tone throughout. Neurologic: Cranial nerves II through XII are grossly intact. No obvious focalities. Speech is fluent. Coordination is intact. Psychiatric: Judgment and insight are intact. Affect is appropriate.   LABORATORY DATA:  Lab Results  Component Value Date   WBC 13.4 (H) 12/14/2018   HGB 13.5 12/14/2018   HCT 39.4 12/14/2018   MCV 89.7 12/14/2018   PLT 370 12/14/2018   CMP     Component Value Date/Time   NA 138 12/14/2018 1324   K 4.1 12/14/2018 1324   CL 104 12/14/2018 1324   CO2 25 12/14/2018 1324   GLUCOSE 103 (H) 12/14/2018 1324   BUN 10 12/14/2018 1324   CREATININE 0.85 12/14/2018 1324   CREATININE 0.84 11/04/2018 0938   CALCIUM 9.7 12/14/2018 1324    PROT 7.2 12/14/2018 1324   ALBUMIN 4.3 12/14/2018 1324   AST 22 12/14/2018 1324   ALT 24 12/14/2018 1324   ALKPHOS 68 12/14/2018 1324   BILITOT 1.2 12/14/2018 1324   GFRNONAA >60 12/14/2018 1324   GFRNONAA 77 11/04/2018 0938   GFRAA >60 12/14/2018 1324   GFRAA 89 11/04/2018 0938         RADIOGRAPHY: Dg Chest 1 View  Result Date: 12/06/2018 CLINICAL DATA:  Post left thoracentesis. EXAM: CHEST  1 VIEW COMPARISON:  Chest radiograph 11/02/2018 FINDINGS: Aeration in left lung has minimally changed following the thoracentesis. Persistent opacity and volume loss in the left upper lobe compatible with the known lesion. Negative for a pneumothorax. Right lung is clear. Cardiomediastinal silhouette is unchanged. Stable blunting at the left costophrenic angle. IMPRESSION: 1. Negative for pneumothorax following the left thoracentesis. 2. Aeration in the left lung has not significantly changed. Persistent opacity and volume loss in the left upper lobe. Electronically Signed   By: Markus Daft M.D.   On: 12/06/2018 12:20   Mr Jeri Cos GD Contrast  Result Date: 12/25/2018 CLINICAL DATA:  S RS brain study. Treatment planning for new diagnosis of non-small  cell lung cancer. Right parietal metastasis shown on previous exam. Calvarial metastases shown on previous exam. Left frontal metastasis on previous exam. EXAM: MRI HEAD WITHOUT AND WITH CONTRAST TECHNIQUE: Multiplanar, multiecho pulse sequences of the brain and surrounding structures were obtained without and with intravenous contrast. CONTRAST:  1mL MULTIHANCE GADOBENATE DIMEGLUMINE 529 MG/ML IV SOLN COMPARISON:  12/07/2018 FINDINGS: Brain: The brainstem and cerebellum are normal. Cerebral hemispheres show minimal small vessel change of the white matter without significant ischemic changes. There is a 13 x 14 mm metastasis in what probably represents the precentral gyrus of the posterior frontal lobe. There is moderate surrounding edema. There is a second  probable metastasis in the left frontal lobe, axial image 92, measuring 2-3 mm. There is an adjacent vessel, but I think this is probably a small metastasis. No third lesion is identified. No evidence of hydrocephalus or extra-axial collection. Vascular: Major vessels at the base of the brain show flow. Skull and upper cervical spine: Multiple calvarial metastases as noted by arrows. Sinuses/Orbits: Clear/normal Other: None IMPRESSION: 13 x 14 mm metastasis with surrounding vasogenic edema in the posterior right frontal lobe axial image 117. Probable 2-3 mm metastasis in the left frontal lobe axial image 92. Multiple calvarial metastases as marked by arrows. Electronically Signed   By: Nelson Chimes M.D.   On: 12/25/2018 14:36   Mr Jeri Cos XA Contrast  Result Date: 12/07/2018 CLINICAL DATA:  Solitary pulmonary nodule.  Staging lung cancer EXAM: MRI HEAD WITHOUT AND WITH CONTRAST TECHNIQUE: Multiplanar, multiecho pulse sequences of the brain and surrounding structures were obtained without and with intravenous contrast. CONTRAST:  8 mL Gadovist IV COMPARISON:  None. FINDINGS: Brain: 14 mm enhancing mass in the right frontal parietal lobe compatible with metastatic disease. Mild to moderate surrounding vasogenic edema. This is near the motor strip. Subtle 2 mm enhancing nodule left frontal cortex also likely due to metastatic disease. Ventricle size normal. No midline shift. Negative for hemorrhage. No acute infarct. Minimal chronic white matter changes. Vascular: Normal arterial flow voids Skull and upper cervical spine: Multiple skull lesions are present suspicious for metastatic disease. The most definitive is in the left occipital bone which shows restricted diffusion and enhancement. Other lesions are suspicious. Sinuses/Orbits: Paranasal sinuses clear. Right mastoid effusion. Normal orbit bilaterally. Other: None IMPRESSION: 14 mm metastatic deposit right frontal parietal lobe with surrounding edema. 2 mm  left frontal lesion highly suspicious for metastatic disease Multiple skull lesions suspicious for metastatic disease. Left occipital skull lesion is very suspicious for metastatic disease These results will be called to the ordering clinician or representative by the Radiologist Assistant, and communication documented in the PACS or zVision Dashboard. Electronically Signed   By: Franchot Gallo M.D.   On: 12/07/2018 16:59   Nm Pet Image Initial (pi) Skull Base To Thigh  Result Date: 12/12/2018 CLINICAL DATA:  Initial treatment strategy for LEFT upper lobe mass EXAM: NUCLEAR MEDICINE PET SKULL BASE TO THIGH TECHNIQUE: Nine point mCi F-18 FDG was injected intravenously. Full-ring PET imaging was performed from the skull base to thigh after the radiotracer. CT data was obtained and used for attenuation correction and anatomic localization. Fasting blood glucose: 94 mg/dl COMPARISON:  CT 11/11/2018 FINDINGS: Mediastinal blood pool activity: SUV max 2.5 NECK: Hypermetabolic LEFT supraclavicular node with SUV max equal 7.4 measures 10 mm short axis (image 55/4) Incidental CT findings: None CHEST: A partially calcified hypermetabolic LEFT suprahilar mass surrounds the LEFT upper lobe bronchus. Mass is intensely hypermetabolic with SUV  max equal 12.9. Mass measures approximately 3.5 x 2.5 cm. Post obstructive segmental collapse of the LEFT upper lobe. There is hypermetabolic nodal extension to the mediastinum with a hypermetabolic AP window node which difficult define on the CT portion but evident on the PET data. Along the most inferior aspect of the pleural surface of the LEFT hemithorax there is hypermetabolic rim of metabolic activity (image 94 for example). Large LEFT effusion. Incidental CT findings: Large LEFT pleural effusion ABDOMEN/PELVIS: Focal metabolic activity of the RIGHT adrenal gland with SUV max equal 6.3. Minimal enlargement the body to 8 mm. LEFT adrenal gland appears normal. No abnormal activity in  liver. No hypermetabolic abdominopelvic lymph nodes. Incidental CT findings: none SKELETON: Multiple foci of intense focal metabolic activity within the spine, sacrum pelvis and including the RIGHT femur and LEFT scapula. Example lesion in the transverse process of the T2 vertebral body on the LEFT with SUV max equal 11.9. There is destructive lesion at this level. Example lesion in the LEFT acetabulum with SUV max equal 15.0. Subtle lucent lesion. Example lesion in the L 3 vertebral body with SUV max equal 17.9. Subtle lytic lesion measuring 2 cm this level. Incidental CT findings: No pathologic fracture IMPRESSION: 1. Intensely hypermetabolic LEFT suprahilar mass consistent primary bronchogenic carcinoma. Post obstructive collapse of the LEFT upper lobe. 2. Evidence of hypermetabolic nodal metastasis including the AP window and LEFT supraclavicular node. LEFT supraclavicular node may be amenable to biopsy. 3. Evidence of pleural metastasis in the LEFT hemithorax. Large LEFT pleural effusion. 4. Multiple foci of hypermetabolic lytic skeletal metastasis in the spine, scapula, pelvis, and RIGHT femur. 5. High concern for small RIGHT adrenal metastasis. Electronically Signed   By: Suzy Bouchard M.D.   On: 12/12/2018 10:15   US Thoracentesis Asp Pleural Space W/img Guide  Result Date: 12/06/2018 INDICATION: Left upper lobe mass with extension into the mediastinum. Pleural effusion. Request for diagnostic and therapeutic thoracentesis. EXAM: ULTRASOUND GUIDED LEFT THORACENTESIS MEDICATIONS: 1% lidocaine 10 mL COMPLICATIONS: None immediate. PROCEDURE: An ultrasound guided thoracentesis was thoroughly discussed with the patient and questions answered. The benefits, risks, alternatives and complications were also discussed. The patient understands and wishes to proceed with the procedure. Written consent was obtained. Ultrasound was performed to localize and mark an adequate pocket of fluid in the left chest. The  area was then prepped and draped in the normal sterile fashion. 1% Lidocaine was used for local anesthesia. Under ultrasound guidance a 6 Fr Safe-T-Centesis catheter was introduced. Thoracentesis was performed. The catheter was removed and a dressing applied. FINDINGS: A total of approximately 1.5 L of hazy yellow fluid was removed. Samples were sent to the laboratory as requested by the clinical team. IMPRESSION: Successful ultrasound guided left thoracentesis yielding 1.5 L of pleural fluid. No pneumothorax on post-procedure chest x-ray. Read by: Gareth Eagle, PA-C Electronically Signed   By: Jerilynn Mages.  Shick M.D.   On: 12/06/2018 13:15      IMPRESSION/PLAN: This is a very pleasant 58 year old woman with metastatic disease to the brain.  I had a lengthy discussion with the patient after reviewing their MRI results with them.  We spoke about whole brain radiotherapy versus stereotactic radiosurgery to the brain. We spoke about the differing risks benefits and side effects of both of these treatments. During part of our discussion, we spoke about the hair loss, fatigue and cognitive effects that can result from whole brain radiotherapy.  Additionally, we spoke about radionecrosis that can result from stereotactic radiosurgery.  I explained that whole brain radiotherapy is more comprehensive and therefore can decrease the chance of recurrences elsewhere in the brain, while stereotactic radiosurgery only treats the areas of gross disease while sparing the rest of the brain parenchyma.  After lengthy discussion, the patient would like to proceed with stereotactic brain radiosurgery to their metastatic disease. They will meet with neurosurgery in the near future to discuss this further; a neurosurgeon will participate in their case.  CT simulation will take place later this morning and treatment on the end of this week, 12/30/18.  I plan to deliver 20 Gy in 1 fraction to both brain lesions.  Dexamethasone taper  given:      __________________________________________   Eppie Gibson, MD  This document serves as a record of services personally performed by Eppie Gibson, MD. It was created on her behalf by Wilburn Mylar, a trained medical scribe. The creation of this record is based on the scribe's personal observations and the provider's statements to them. This document has been checked and approved by the attending provider.

## 2018-12-26 NOTE — Progress Notes (Signed)
Has armband been applied?  Yes  Does patient have an allergy to IV contrast dye?: No   Has patient ever received premedication for IV contrast dye?: N/A  Does patient take metformin?: No  If patient does take metformin when was the last dose: N/A  Date of lab work: 12/16/18 BUN: 10 CR: 0.85 EGFR: >60  IV site: :Left AC  Has IV site been added to flowsheet?  Yes

## 2018-12-27 ENCOUNTER — Ambulatory Visit
Admission: RE | Admit: 2018-12-27 | Discharge: 2018-12-27 | Disposition: A | Discharge status: Home / Self Care | Payer: Managed Care, Other (non HMO) | Source: Ambulatory Visit | Attending: Radiation Oncology | Admitting: Radiation Oncology

## 2018-12-27 ENCOUNTER — Encounter: Payer: Self-pay | Admitting: Radiation Oncology

## 2018-12-27 ENCOUNTER — Other Ambulatory Visit: Payer: Self-pay | Admitting: Radiation Oncology

## 2018-12-27 ENCOUNTER — Other Ambulatory Visit: Payer: Self-pay

## 2018-12-27 VITALS — BP 113/74 | HR 85 | Temp 98.3°F | Resp 20 | Ht 68.0 in | Wt 176.0 lb

## 2018-12-27 DIAGNOSIS — C7931 Secondary malignant neoplasm of brain: Secondary | ICD-10-CM

## 2018-12-27 DIAGNOSIS — Z510 Encounter for antineoplastic radiation therapy: Secondary | ICD-10-CM | POA: Diagnosis not present

## 2018-12-27 MED ORDER — SODIUM CHLORIDE 0.9% FLUSH
10.0000 mL | Freq: Once | INTRAVENOUS | Status: AC
  Administered 2018-12-27: 10 mL via INTRAVENOUS

## 2018-12-27 MED ORDER — LORAZEPAM 1 MG PO TABS: ORAL_TABLET | ORAL | 0 refills | Status: DC

## 2018-12-27 NOTE — Progress Notes (Signed)
  Radiation Oncology         (336) [phone] ________________________________  Name: Tina Cruz MRN: [id number]  Date: 12/27/2018  DOB: 1961-01-21  SIMULATION AND TREATMENT PLANNING NOTE; SPECIAL TREATMENT PROCEDURE NOTE:  Outpatient  DIAGNOSIS:    ICD-10-CM   1. Brain metastasis ([place]) C79.31     NARRATIVE:  The patient was brought to the [facility].  Identity was confirmed.  All relevant records and images related to the planned course of therapy were reviewed.  The patient freely provided informed written consent to proceed with treatment after reviewing the details related to the planned course of therapy. The consent form was witnessed and verified by the simulation staff. Intravenous access was established for contrast administration. Then, the patient was set-up in a stable reproducible supine position for radiation therapy.  A relocatable thermoplastic stereotactic head frame was fabricated for precise immobilization.  CT images were obtained.  Surface markings were placed.  The CT images were loaded into the planning software and fused with the patient's targeting MRI scan.  Then the target and avoidance structures were contoured.  Treatment planning then occurred.  The radiation prescription was entered and confirmed.  I have requested 3D planning  I have requested a DVH of the following structures: Brain stem, brain, left eye, right ete, lenses, optic chiasm, target volumes, uninvolved brain, and normal tissue.    PLAN:  The patient will receive 20 Gy in 1 fraction to the two brain metastases. Stereotactic radiosurgery will be used.  SPECIAL TREATMENT PROCEDURE NOTE:   This constitutes a special treatment procedure due to the ablative dose that will be delivered and the technical nature of treatment.  This highly technical modality of treatment ensures that the ablative dose is centered on the patient's tumor while sparing normal tissues from excessive dose and  risk of detrimental effects.   -----------------------------------  Eppie Gibson, MD

## 2018-12-28 ENCOUNTER — Other Ambulatory Visit: Payer: Self-pay

## 2018-12-28 ENCOUNTER — Ambulatory Visit
Admission: RE | Admit: 2018-12-28 | Discharge: 2018-12-28 | Disposition: A | Discharge status: Home / Self Care | Payer: Managed Care, Other (non HMO) | Source: Ambulatory Visit | Attending: Radiation Oncology | Admitting: Radiation Oncology

## 2018-12-28 DIAGNOSIS — Z510 Encounter for antineoplastic radiation therapy: Secondary | ICD-10-CM | POA: Diagnosis not present

## 2018-12-29 ENCOUNTER — Ambulatory Visit: Payer: Managed Care, Other (non HMO)

## 2018-12-29 ENCOUNTER — Telehealth: Payer: Self-pay

## 2018-12-29 NOTE — Telephone Encounter (Signed)
Pt severely constipated. Pt did pass small amount of hard stool and noted scant BRB in stool. Pt encouraged to use Fleets enema followed by half bottle of magnesium citrate. Pt was encouraged to follow up with this RN this afternoon via phone. Direct number given. Pt reassured. Pt verbalized understanding and agreement. Loma Sousa, RN BSN

## 2018-12-30 ENCOUNTER — Other Ambulatory Visit: Payer: Self-pay

## 2018-12-30 ENCOUNTER — Encounter (HOSPITAL_COMMUNITY): Payer: Self-pay | Admitting: Cardiothoracic Surgery

## 2018-12-30 ENCOUNTER — Ambulatory Visit
Admission: RE | Admit: 2018-12-30 | Discharge: 2018-12-30 | Disposition: A | Discharge status: Home / Self Care | Payer: Managed Care, Other (non HMO) | Source: Ambulatory Visit | Attending: Radiation Oncology | Admitting: Radiation Oncology

## 2018-12-30 ENCOUNTER — Encounter: Payer: Self-pay | Admitting: Radiation Oncology

## 2018-12-30 DIAGNOSIS — Z923 Personal history of irradiation: Secondary | ICD-10-CM

## 2018-12-30 DIAGNOSIS — Z510 Encounter for antineoplastic radiation therapy: Secondary | ICD-10-CM | POA: Diagnosis not present

## 2018-12-30 DIAGNOSIS — C7931 Secondary malignant neoplasm of brain: Secondary | ICD-10-CM

## 2018-12-30 HISTORY — DX: Personal history of irradiation: Z92.3

## 2018-12-30 NOTE — Op Note (Signed)
  Name: Tina Cruz  MRN: [id number]  Date: 12/30/2018   DOB: 1960-12-25  Stereotactic Radiosurgery Operative Note  PRE-OPERATIVE DIAGNOSIS:  Multiple Brain Metastases  POST-OPERATIVE DIAGNOSIS:  Multiple Brain Metastases  PROCEDURE:  Stereotactic Radiosurgery  SURGEON:  Peggyann Shoals, MD  NARRATIVE: The patient underwent a radiation treatment planning session in the radiation oncology simulation suite under the care of the radiation oncology physician and physicist.  I participated closely in the radiation treatment planning afterwards. The patient underwent planning CT which was fused to 3T high resolution MRI with 1 mm axial slices.  These images were fused on the planning system.  We contoured the gross target volumes and subsequently expanded this to yield the Planning Target Volume. I actively participated in the planning process.  I helped to define and review the target contours and also the contours of the optic pathway, eyes, brainstem and selected nearby organs at risk.  All the dose constraints for critical structures were reviewed and compared to AAPM Task Group 101.  The prescription dose conformity was reviewed.  I approved the plan electronically.    Accordingly, Tina Cruz was brought to the TrueBeam stereotactic radiation treatment linac and placed in the custom immobilization mask.  The patient was aligned according to the IR fiducial markers with BrainLab Exactrac, then orthogonal x-rays were used in ExacTrac with the 6DOF robotic table and the shifts were made to align the patient  Tina Cruz received stereotactic radiosurgery uneventfully.    Lesions treated:  2   Complex lesions treated:  0 (>3.5 cm, <37mm of optic path, or within the brainstem)   The detailed description of the procedure is recorded in the radiation oncology procedure note.  I was present for the duration of the procedure.  DISPOSITION:  Following delivery, the patient was  transported to nursing in stable condition and monitored for possible acute effects to be discharged to home in stable condition with follow-up in one month.  Peggyann Shoals, MD 12/30/2018 2:12 PM

## 2018-12-30 NOTE — Progress Notes (Signed)
  Radiation Oncology         (336) [phone] ________________________________  Stereotactic Treatment Procedure Note  Name: Tina Cruz MRN: [id number]  Date: 12/30/2018  DOB: 1961/05/21  Outpatient  SPECIAL TREATMENT PROCEDURE    ICD-10-CM   1. Brain metastasis ([place]) C79.31      3D TREATMENT PLANNING AND DOSIMETRY:  The patient's radiation plan was reviewed and approved by neurosurgery and radiation oncology prior to treatment.  It showed 3-dimensional radiation distributions overlaid onto the planning CT/MRI image set.  The [facility] for the target structures as well as the organs at risk were reviewed. The documentation of the 3D plan and dosimetry are filed in the radiation oncology EMR.  NARRATIVE:  Tina Cruz was brought to the TrueBeam stereotactic radiation treatment machine and placed supine on the CT couch. The head frame was applied, and the patient was set up for stereotactic radiosurgery.  Neurosurgery was present for the set-up and delivery  SIMULATION VERIFICATION:  In the couch zero-angle position, the patient underwent Exactrac imaging using the Brainlab system with orthogonal KV images.  These were carefully aligned and repeated to confirm treatment position for each of the isocenters.  The Exactrac snap film verification was repeated at each couch angle.  SPECIAL TREATMENT PROCEDURE: Tina Cruz received stereotactic radiosurgery to the following targets: Right posterior frontal 77mm target was treated using 4 Rapid Arc VMAT Beams to a prescription dose of 20 Gy.  ExacTrac Snap verification was performed for each couch angle. Left frontal 40mm target was treated using 4 Dynamic Conformal Arcs to a prescription dose of 20 Gy.  ExacTrac Snap verification was performed for each couch angle.  This constitutes a special treatment procedure due to the ablative dose delivered and the technical nature of treatment.  This highly technical modality of treatment  ensures that the ablative dose is centered on the patient's tumor while sparing normal tissues from excessive dose and risk of detrimental effects.  STEREOTACTIC TREATMENT MANAGEMENT:  Following delivery, the patient was transported to nursing in stable condition and monitored for possible acute effects.   The patient tolerated treatment without significant acute effects, and was discharged to home in stable condition.    PLAN: Follow-up in one month with Dr Sondra Come.  ________________________________   Eppie Gibson, MD

## 2019-01-02 ENCOUNTER — Ambulatory Visit
Admission: RE | Admit: 2019-01-02 | Discharge: 2019-01-02 | Disposition: A | Discharge status: Home / Self Care | Payer: Managed Care, Other (non HMO) | Source: Ambulatory Visit | Attending: Radiation Oncology | Admitting: Radiation Oncology

## 2019-01-02 ENCOUNTER — Other Ambulatory Visit: Payer: Self-pay

## 2019-01-02 ENCOUNTER — Encounter: Payer: Self-pay | Admitting: Radiation Oncology

## 2019-01-02 DIAGNOSIS — Z510 Encounter for antineoplastic radiation therapy: Secondary | ICD-10-CM | POA: Diagnosis not present

## 2019-01-03 ENCOUNTER — Ambulatory Visit
Admission: RE | Admit: 2019-01-03 | Discharge: 2019-01-03 | Disposition: A | Discharge status: Home / Self Care | Payer: Managed Care, Other (non HMO) | Source: Ambulatory Visit | Attending: Radiation Oncology | Admitting: Radiation Oncology

## 2019-01-03 ENCOUNTER — Other Ambulatory Visit: Payer: Self-pay | Admitting: Radiation Oncology

## 2019-01-03 ENCOUNTER — Other Ambulatory Visit: Payer: Self-pay

## 2019-01-03 ENCOUNTER — Other Ambulatory Visit: Payer: Self-pay | Admitting: *Deleted

## 2019-01-03 DIAGNOSIS — J910 Malignant pleural effusion: Secondary | ICD-10-CM

## 2019-01-03 DIAGNOSIS — Z510 Encounter for antineoplastic radiation therapy: Secondary | ICD-10-CM | POA: Diagnosis not present

## 2019-01-03 DIAGNOSIS — C3492 Malignant neoplasm of unspecified part of left bronchus or lung: Secondary | ICD-10-CM

## 2019-01-03 MED ORDER — OXYCODONE HCL 5 MG PO TABS: 5.0000 mg | ORAL_TABLET | Freq: Two times a day (BID) | ORAL | 0 refills | Status: DC

## 2019-01-04 ENCOUNTER — Other Ambulatory Visit: Payer: Self-pay

## 2019-01-04 ENCOUNTER — Telehealth: Payer: Self-pay

## 2019-01-04 ENCOUNTER — Ambulatory Visit: Payer: Managed Care, Other (non HMO) | Admitting: Cardiothoracic Surgery

## 2019-01-04 ENCOUNTER — Ambulatory Visit
Admission: RE | Admit: 2019-01-04 | Discharge: 2019-01-04 | Disposition: A | Discharge status: Home / Self Care | Payer: Managed Care, Other (non HMO) | Source: Ambulatory Visit | Attending: Cardiothoracic Surgery | Admitting: Cardiothoracic Surgery

## 2019-01-04 ENCOUNTER — Other Ambulatory Visit: Payer: Self-pay | Admitting: *Deleted

## 2019-01-04 ENCOUNTER — Ambulatory Visit
Admission: RE | Admit: 2019-01-04 | Discharge: 2019-01-04 | Disposition: A | Discharge status: Home / Self Care | Payer: Managed Care, Other (non HMO) | Source: Ambulatory Visit | Attending: Radiation Oncology | Admitting: Radiation Oncology

## 2019-01-04 ENCOUNTER — Telehealth (INDEPENDENT_AMBULATORY_CARE_PROVIDER_SITE_OTHER): Payer: Managed Care, Other (non HMO) | Admitting: Cardiothoracic Surgery

## 2019-01-04 ENCOUNTER — Encounter: Payer: Self-pay | Admitting: Cardiothoracic Surgery

## 2019-01-04 DIAGNOSIS — J910 Malignant pleural effusion: Secondary | ICD-10-CM

## 2019-01-04 DIAGNOSIS — J90 Pleural effusion, not elsewhere classified: Secondary | ICD-10-CM

## 2019-01-04 DIAGNOSIS — Z510 Encounter for antineoplastic radiation therapy: Secondary | ICD-10-CM | POA: Diagnosis not present

## 2019-01-04 IMAGING — CR CHEST - 2 VIEW
2 series · 2 of 2 positions shown · non-contrast
Comparison: [DATE]

CLINICAL DATA: Malignant pleural effusion.

EXAM:
CHEST - 2 VIEW

[w chest pa]
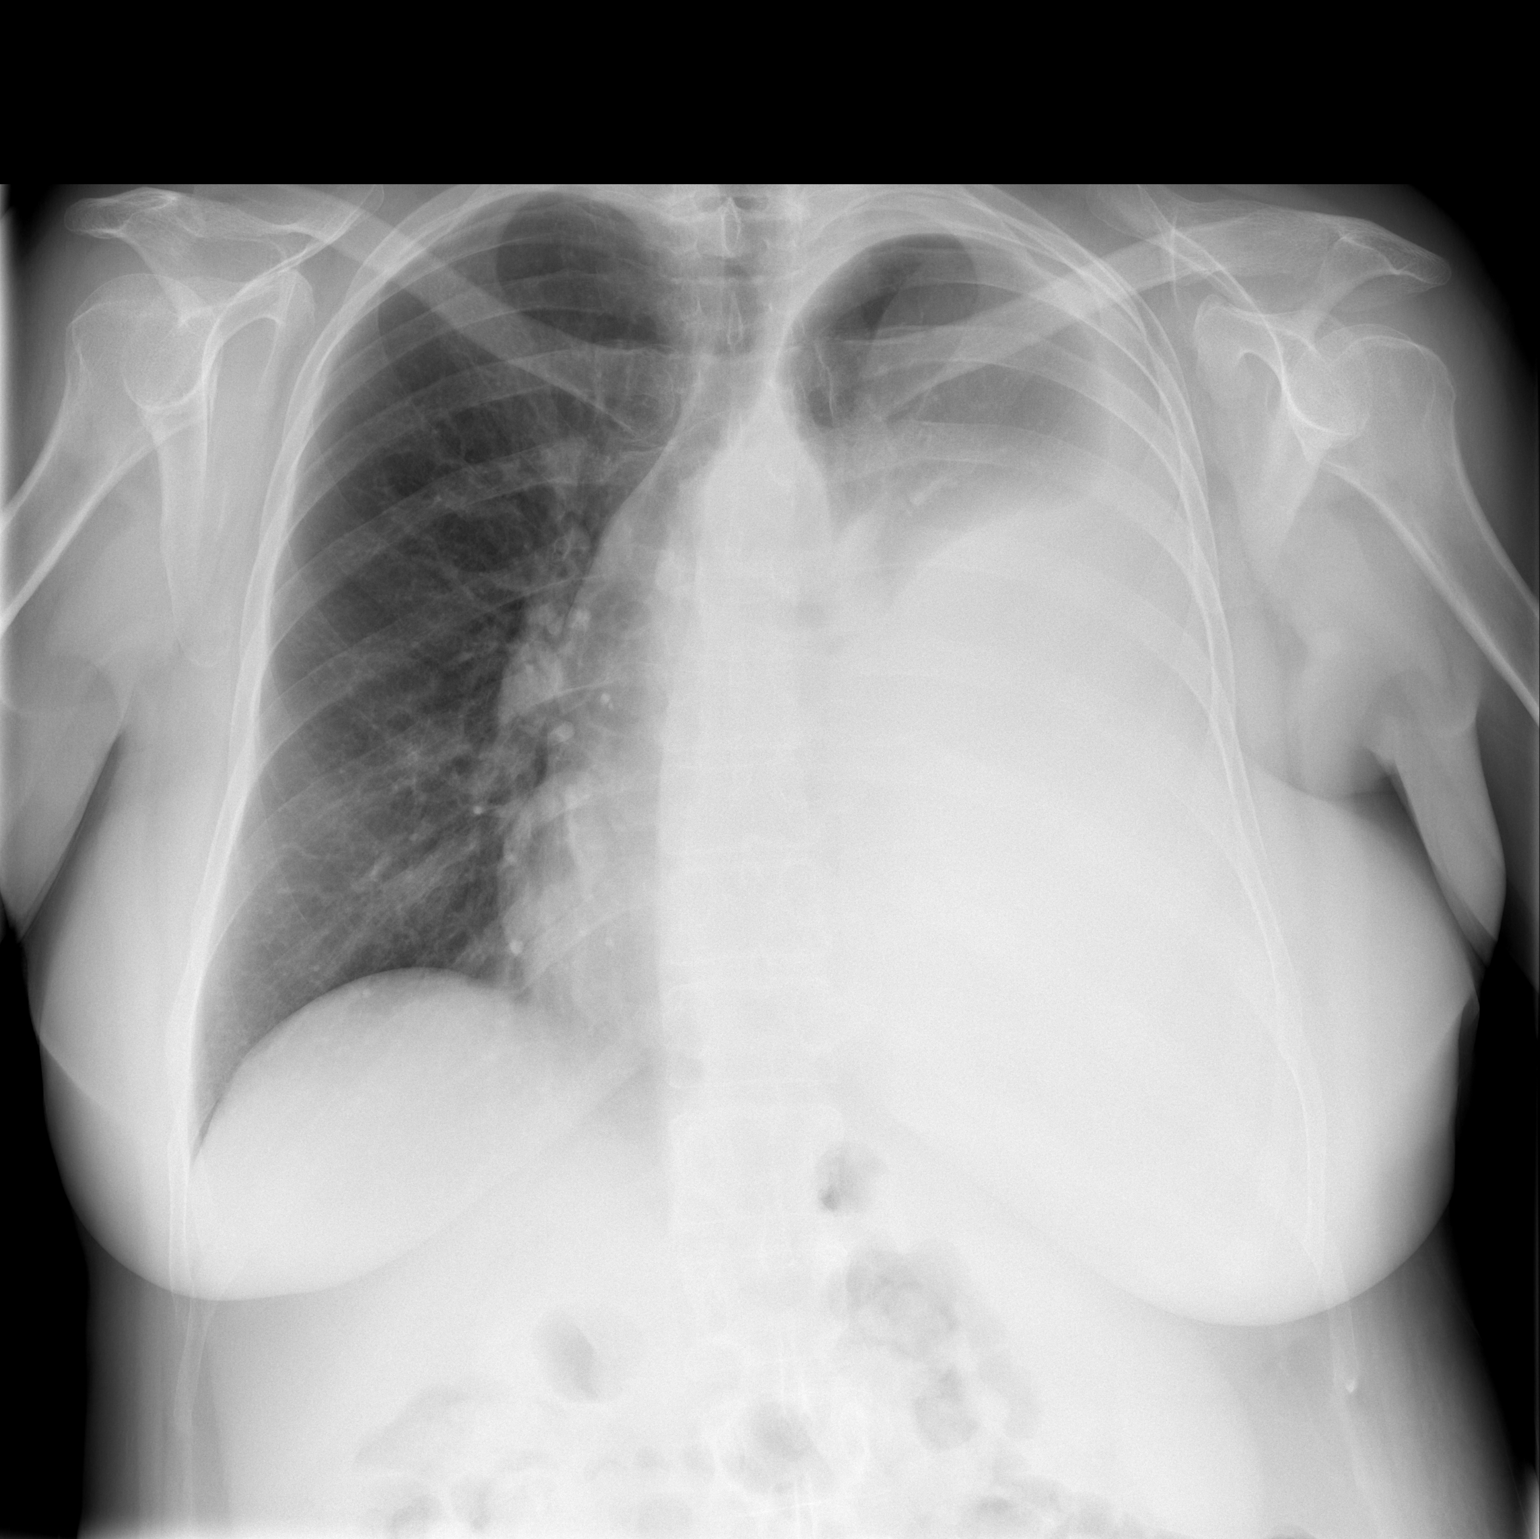

[w chest lat]
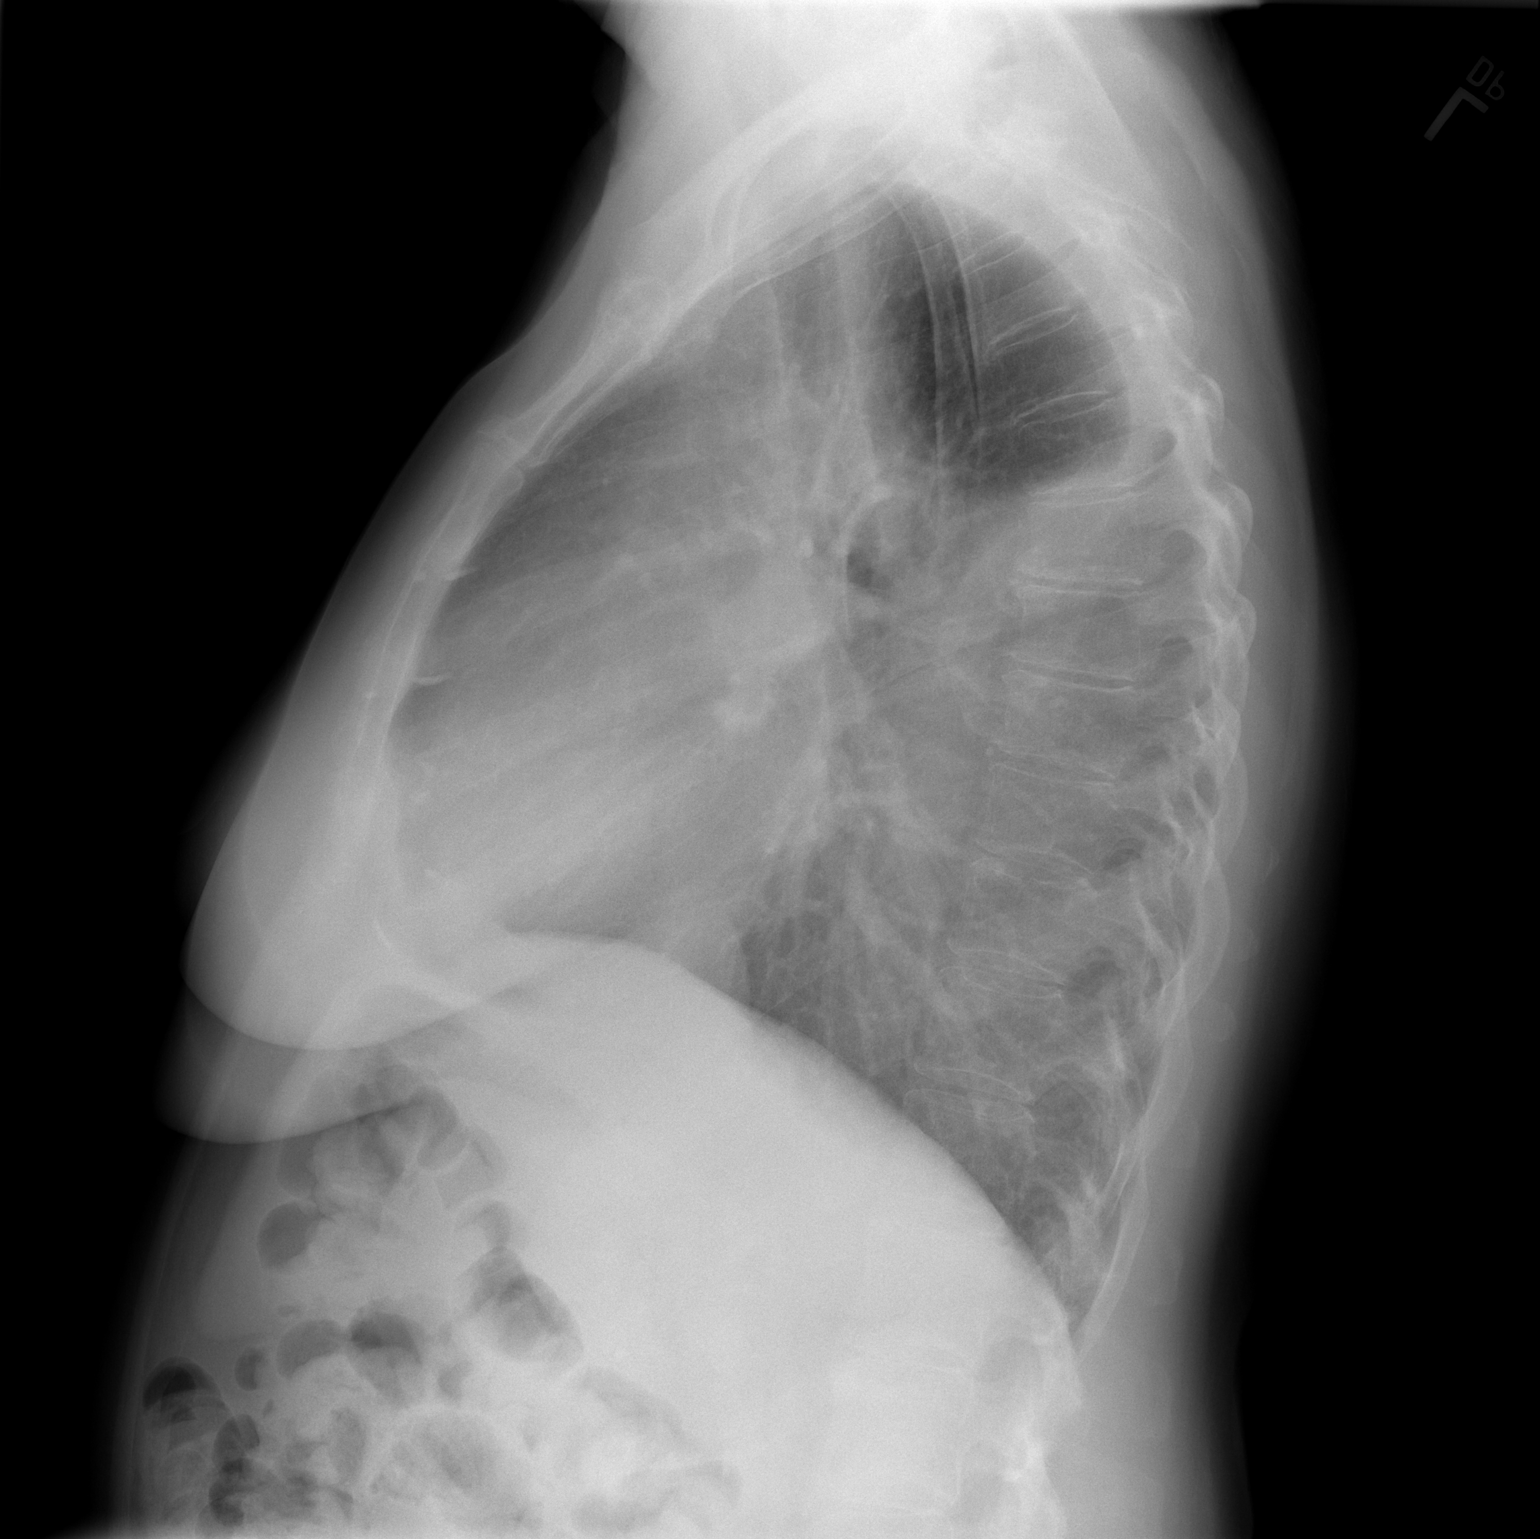

[2 of 2 positions shown; findings below may reference images not displayed]

FINDINGS: Increased densities throughout the left chest compatible with a
large left pleural effusion. There appears to be mediastinal shift
towards the right due to the large effusion. Left pleural effusion
has enlarged from the prior imaging and there is a small amount of
residual aeration in the left upper lung. Right lung is clear. No
acute bone abnormality. Left side of the heart is obscured by the
large left pleural effusion.
IMPRESSION: Large left pleural effusion. Pleural effusion has enlarged since the
PET-CT on [DATE]. Right mediastinal shift due to the large left
pleural effusion.

## 2019-01-04 NOTE — Telephone Encounter (Signed)
[address]       [place] [address]             [phone]     CARDIOTHORACIC SURGERY TELEPHONE VIRTUAL OFFICE NOTE  Referring Provider is No ref. provider found Primary Cardiologist is No primary care provider on file. PCP is [place], Modena Nunnery, MD   HPI:  I spoke with Francesco Runner via telephone on 01/04/2019 at 4:14 PM and verified that I was speaking with the correct person.  I called the patient from my office with her electronic medical record in front of me and she was at home.  Patient has been diagnosed with stage IV non-cell carcinoma/adenocarcinoma with diagnosis made from left pleural effusion.  Since the thoracentesis the left pleural effusion has significant large as documented by chest x-ray today.  The patient is symptomatic with shortness of breath.  The patient would benefit from a left Pleurx catheter for drainage of the recurrent left malignant effusion.  The patient is receiving radiation therapy at this time daily to her brain and chest.  Pleurx catheter will be scheduled at [facility] the afternoon of March 27.  I discussed the procedure including the benefits, alternatives, and risks.  She understands this will be an outpatient procedure and will help manage the recurrent effusion so that she will not have to keep coming to the hospital for thoracentesis.  She understands the risk of bleeding and infection.   Current Outpatient Medications  Medication Sig Dispense Refill  . azelastine (ASTELIN) 0.1 % nasal spray Place 1 spray into both nostrils 2 (two) times daily as needed for rhinitis. Use in each nostril as directed 30 mL 12  . Cetirizine HCl (ZYRTEC ALLERGY) 10 MG CAPS Take 1 capsule (10 mg total) by mouth daily as needed (allergies). 30 capsule 11  . cyclobenzaprine (FLEXERIL) 10 MG tablet Take 1 tablet (10 mg total) by mouth 3 (three) times daily as needed for muscle spasms. 30 tablet 0  . dexamethasone (DECADRON) 4 MG tablet  Take 1 tablet (4 mg total) by mouth 2 (two) times daily. 30 tablet 1  . famotidine (PEPCID) 20 MG tablet Take 20 mg by mouth daily.    . Ibuprofen (ADVIL PO) Take 1 tablet by mouth as needed (220mg  OTC).    Marland Kitchen levalbuterol (XOPENEX HFA) 45 MCG/ACT inhaler Inhale 1-2 puffs into the lungs every 4 (four) hours as needed for wheezing or shortness of breath. 1 Inhaler 0  . levothyroxine (SYNTHROID, LEVOTHROID) 112 MCG tablet TAKE 1 TABLET (112 MCG TOTAL) BY MOUTH DAILY BEFORE BREAKFAST. 90 tablet 1  . LORazepam (ATIVAN) 1 MG tablet Take 1 tablet about 30 min before wearing mask for brain treatment. Must have a driver after taking this. Take 1 tab PRN before MRIs in the future, if feeling anxious. 3 tablet 0  . oxyCODONE (OXY IR/ROXICODONE) 5 MG immediate release tablet Take 1 tablet (5 mg total) by mouth 2 (two) times daily. 30 tablet 0   No current facility-administered medications for this visit.      Diagnostic Tests:  Images of chest x-ray taken today personally reviewed and discussed with patient   Impression: Recurrent malignant left pleural effusion in a patient with stage IV adenocarcinoma of the left lung.  She would benefit from left Pleurx catheter  Plan:  Patient we prepared for outpatient left Pleurx catheter insertion at [facility] on March 27.    I discussed limitations of evaluation and management  via telephone.  The patient was advised to call back or seek an in-person evaluation if the patient's clinical condition changes in any significant manner.  I spent in excess of 87minutes of non-face-to-face time during the conduct of this telephone virtual office consultation.  Level 1  (99441)             5-10 minutes Level 2  (99442)            11-20 minutes Level 3  (99443)            21-30 minutes   Dahlia Byes MD 01/04/2019 4:14 PM

## 2019-01-04 NOTE — Telephone Encounter (Signed)
Nutrition Assessment   Reason for Assessment:   Patient identified on Malnutrition Screening report for weight loss and poor appetite.   ASSESSMENT:  58 year old female with stage IV lung cancer with metastatic disease to bone, adrenal gland and brain.  Past medical history of depression, bronchitis, thyroid disease.  Patient receiving palliative radiation.  Called patient to introduce self and service.  Patient reports appetite has been effected some by nausea but more by constipation.  Reports she feels full at times.  Reports that she has tried carnation breakfast essentials and has been drinking them although feels that milk causing gas and bloating.  Reports that she is not taking anything for nausea.      Nutrition Focused Physical Exam: deferred   Medications: reviewed    Labs: reviewed   Anthropometrics:   Height: 68 inches Weight: 176 lb  Noted weight of 192 in 11/02/2018 BMI: 26  8% weight loss in the last 2 months   Estimated Energy Needs  Kcals: 2000-2400 calories Protein: 100-120 g Fluid: > 2 L    NUTRITION DIAGNOSIS: Inadequate oral intake related to cancer and cancer related treatment side effects as evidenced by 8% weight loss in the last 2 months   INTERVENTION:  Discussed strategies to help with constipation.  Encouraged follow-up with MD for bowel regimen if not working. Encouraged patient to speak with MD regarding antiemetic therapy.   Discussed strategies to increase calories and protein and handout emailed to patient.   Contact information given to patient   MONITORING, EVALUATION, GOAL: Patient will consume adequate calories and protein to meet nutritional needs   Next Visit: patient to contact if needed  Tina Cruz B. Zenia Resides, [place], [place] Registered Dietitian [phone] (pager)

## 2019-01-05 ENCOUNTER — Ambulatory Visit
Admission: RE | Admit: 2019-01-05 | Discharge: 2019-01-05 | Disposition: A | Discharge status: Home / Self Care | Payer: Managed Care, Other (non HMO) | Source: Ambulatory Visit | Attending: Radiation Oncology | Admitting: Radiation Oncology

## 2019-01-05 ENCOUNTER — Other Ambulatory Visit: Payer: Self-pay | Admitting: Medical Oncology

## 2019-01-05 ENCOUNTER — Encounter (HOSPITAL_COMMUNITY): Payer: Self-pay | Admitting: *Deleted

## 2019-01-05 ENCOUNTER — Other Ambulatory Visit: Payer: Self-pay

## 2019-01-05 DIAGNOSIS — Z510 Encounter for antineoplastic radiation therapy: Secondary | ICD-10-CM | POA: Diagnosis not present

## 2019-01-05 DIAGNOSIS — C3492 Malignant neoplasm of unspecified part of left bronchus or lung: Secondary | ICD-10-CM

## 2019-01-05 NOTE — Progress Notes (Signed)
Spoke with patient regarding pre-op instructions for DOS.  Patient denies chest pain, denies cardiac hx.  Patient has SOB due to her lung fluid.  Patient instructed to stop now all vitamins, supplements, Ibuprofen, Motrin, Aleve, Fish Oil, BC powder, Goody's until after surgery.  May use tylenol if needed.  Patient informed of hospital's visitation restriction policy that's now in effect.      Cardiologist -  Denies  Chest x-ray - 01/04/2019 EKG - denies Stress Test -denies  ECHO - denies Cardiac Cath - denies  Sleep Study - denies CPAP - N/A   Coronavirus Screening  Have you or your husband Ronalee Belts experienced the following symptoms:  Cough yes/no: No Fever (>100.78F)  yes/no: No Runny nose yes/no: No Sore throat yes/no: No  Congestion - No N/V - No Difficulty breathing/shortness of breath  No for husband, yes for patient due to lung fluid.  Have you or your husband Ronalee Belts traveled in the last 14 days and where? yes/no: No  Patient verbalizes understanding of all pre-op instructions.

## 2019-01-06 ENCOUNTER — Ambulatory Visit: Admission: RE | Admit: 2019-01-06 | Payer: Managed Care, Other (non HMO) | Source: Ambulatory Visit

## 2019-01-06 ENCOUNTER — Other Ambulatory Visit: Payer: Self-pay

## 2019-01-06 ENCOUNTER — Other Ambulatory Visit: Payer: Self-pay | Admitting: *Deleted

## 2019-01-06 ENCOUNTER — Ambulatory Visit (HOSPITAL_COMMUNITY)
Admission: RE | Admit: 2019-01-06 | Payer: Managed Care, Other (non HMO) | Source: Home / Self Care | Admitting: Cardiothoracic Surgery

## 2019-01-06 ENCOUNTER — Emergency Department (HOSPITAL_COMMUNITY): Payer: Managed Care, Other (non HMO)

## 2019-01-06 ENCOUNTER — Inpatient Hospital Stay (HOSPITAL_BASED_OUTPATIENT_CLINIC_OR_DEPARTMENT_OTHER): Payer: Managed Care, Other (non HMO) | Admitting: Medical

## 2019-01-06 ENCOUNTER — Encounter (HOSPITAL_COMMUNITY): Payer: Self-pay | Admitting: Emergency Medicine

## 2019-01-06 ENCOUNTER — Encounter (HOSPITAL_COMMUNITY)
Admission: EM | Disposition: A | Discharge status: Home Health | Payer: Self-pay | Source: Home / Self Care | Attending: Internal Medicine

## 2019-01-06 ENCOUNTER — Inpatient Hospital Stay (HOSPITAL_COMMUNITY)
Admission: EM | Admit: 2019-01-06 | Discharge: 2019-01-08 | DRG: 176 | Disposition: A | Discharge status: Home Health | Payer: Managed Care, Other (non HMO) | Attending: Internal Medicine | Admitting: Internal Medicine

## 2019-01-06 DIAGNOSIS — J449 Chronic obstructive pulmonary disease, unspecified: Secondary | ICD-10-CM | POA: Diagnosis present

## 2019-01-06 DIAGNOSIS — R0902 Hypoxemia: Secondary | ICD-10-CM

## 2019-01-06 DIAGNOSIS — E039 Hypothyroidism, unspecified: Secondary | ICD-10-CM | POA: Diagnosis present

## 2019-01-06 DIAGNOSIS — C7951 Secondary malignant neoplasm of bone: Secondary | ICD-10-CM | POA: Diagnosis present

## 2019-01-06 DIAGNOSIS — I2699 Other pulmonary embolism without acute cor pulmonale: Principal | ICD-10-CM | POA: Diagnosis present

## 2019-01-06 DIAGNOSIS — Z87891 Personal history of nicotine dependence: Secondary | ICD-10-CM | POA: Diagnosis not present

## 2019-01-06 DIAGNOSIS — J90 Pleural effusion, not elsewhere classified: Secondary | ICD-10-CM

## 2019-01-06 DIAGNOSIS — C7931 Secondary malignant neoplasm of brain: Secondary | ICD-10-CM

## 2019-01-06 DIAGNOSIS — Z923 Personal history of irradiation: Secondary | ICD-10-CM

## 2019-01-06 DIAGNOSIS — C3492 Malignant neoplasm of unspecified part of left bronchus or lung: Secondary | ICD-10-CM | POA: Diagnosis present

## 2019-01-06 DIAGNOSIS — Z79890 Hormone replacement therapy (postmenopausal): Secondary | ICD-10-CM

## 2019-01-06 DIAGNOSIS — K219 Gastro-esophageal reflux disease without esophagitis: Secondary | ICD-10-CM | POA: Diagnosis present

## 2019-01-06 DIAGNOSIS — E079 Disorder of thyroid, unspecified: Secondary | ICD-10-CM | POA: Diagnosis present

## 2019-01-06 DIAGNOSIS — I2609 Other pulmonary embolism with acute cor pulmonale: Secondary | ICD-10-CM | POA: Diagnosis not present

## 2019-01-06 DIAGNOSIS — J910 Malignant pleural effusion: Secondary | ICD-10-CM | POA: Diagnosis present

## 2019-01-06 DIAGNOSIS — Z9689 Presence of other specified functional implants: Secondary | ICD-10-CM

## 2019-01-06 HISTORY — DX: Gastro-esophageal reflux disease without esophagitis: K21.9

## 2019-01-06 HISTORY — PX: IR PERC PLEURAL DRAIN W/INDWELL CATH W/IMG GUIDE: IMG5383

## 2019-01-06 HISTORY — DX: Dyspnea, unspecified: R06.00

## 2019-01-06 HISTORY — DX: Malignant (primary) neoplasm, unspecified: C80.1

## 2019-01-06 HISTORY — DX: Hypothyroidism, unspecified: E03.9

## 2019-01-06 HISTORY — DX: Other seasonal allergic rhinitis: J30.2

## 2019-01-06 HISTORY — DX: Constipation, unspecified: K59.00

## 2019-01-06 HISTORY — DX: Presence of dental prosthetic device (complete) (partial): Z97.2

## 2019-01-06 LAB — CBC WITH DIFFERENTIAL/PLATELET
Abs Immature Granulocytes: 0.12 10*3/uL — ABNORMAL HIGH (ref 0.00–0.07)
Basophils Absolute: 0 10*3/uL (ref 0.0–0.1)
Basophils Relative: 0 %
Eosinophils Absolute: 0 10*3/uL (ref 0.0–0.5)
Eosinophils Relative: 0 %
HCT: 41.8 % (ref 36.0–46.0)
Hemoglobin: 14.1 g/dL (ref 12.0–15.0)
IMMATURE GRANULOCYTES: 1 %
Lymphocytes Relative: 1 %
Lymphs Abs: 0.2 10*3/uL — ABNORMAL LOW (ref 0.7–4.0)
MCH: 31.3 pg (ref 26.0–34.0)
MCHC: 33.7 g/dL (ref 30.0–36.0)
MCV: 92.7 fL (ref 80.0–100.0)
Monocytes Absolute: 0.6 10*3/uL (ref 0.1–1.0)
Monocytes Relative: 5 %
Neutro Abs: 11.5 10*3/uL — ABNORMAL HIGH (ref 1.7–7.7)
Neutrophils Relative %: 93 %
PLATELETS: 215 10*3/uL (ref 150–400)
RBC: 4.51 MIL/uL (ref 3.87–5.11)
RDW: 13.3 % (ref 11.5–15.5)
WBC: 12.5 10*3/uL — ABNORMAL HIGH (ref 4.0–10.5)
nRBC: 0 % (ref 0.0–0.2)

## 2019-01-06 LAB — COMPREHENSIVE METABOLIC PANEL
ALBUMIN: 4 g/dL (ref 3.5–5.0)
ALK PHOS: 81 U/L (ref 38–126)
ALT: 38 U/L (ref 0–44)
ANION GAP: 11 (ref 5–15)
AST: 27 U/L (ref 15–41)
BUN: 12 mg/dL (ref 6–20)
CO2: 25 mmol/L (ref 22–32)
Calcium: 9.3 mg/dL (ref 8.9–10.3)
Chloride: 103 mmol/L (ref 98–111)
Creatinine, Ser: 0.64 mg/dL (ref 0.44–1.00)
GFR calc Af Amer: >60 mL/min (ref >60)
GFR calc non Af Amer: >60 mL/min (ref >60)
GLUCOSE: 142 mg/dL — AB (ref 70–99)
Potassium: 3.7 mmol/L (ref 3.5–5.1)
Sodium: 139 mmol/L (ref 135–145)
Total Bilirubin: 1.6 mg/dL — ABNORMAL HIGH (ref 0.3–1.2)
Total Protein: 7.1 g/dL (ref 6.5–8.1)

## 2019-01-06 LAB — PROTIME-INR
INR: 1 (ref 0.8–1.2)
Prothrombin Time: 13.2 seconds (ref 11.4–15.2)

## 2019-01-06 IMAGING — XA IR PERC PLEURAL DRAIN W/INDWELL CATH W/IMG GUIDE
1 series · 1 of 1 positions shown · non-contrast
Comparison: none

INDICATION: 57-year-old with lung cancer and malignant left pleural effusion.
Patient was [facility] but became short of breath and ended
consulted for placement of the tunneled pleural catheter.

[Series 300: ir perc pleural drain w/indwell cath w/i · 1 of 1 slices shown]
[im 1/1]
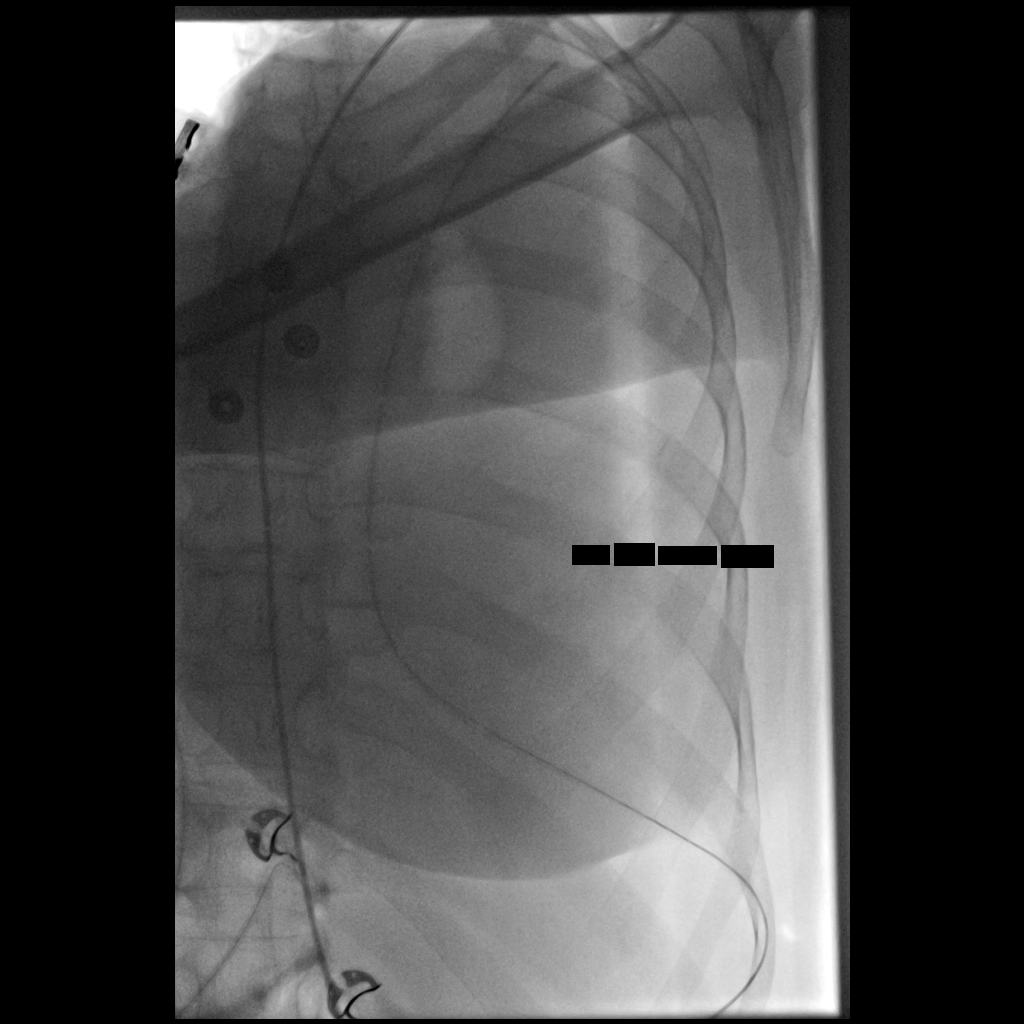

[1 of 1 positions shown; findings below may reference images not displayed]

EXAM:
PLACEMENT OF TUNNELED LEFT PLEURAL CATHETER WITH ULTRASOUND AND
FLUOROSCOPIC GUIDANCE

MEDICATIONS:
Ancef 2 g

ANESTHESIA/SEDATION:
Fentanyl 50 mcg IV; Versed 1 mg IV

Moderate Sedation Time:  24 minutes

The patient was continuously monitored during the procedure by the
interventional radiology nurse under my direct supervision.

FLUOROSCOPY TIME:  30 seconds, 15 mGy

COMPLICATIONS:
None immediate.

PROCEDURE:
Informed written consent was obtained from the patient after a
thorough discussion of the procedural risks, benefits and
alternatives. All questions were addressed. Maximal Sterile Barrier
Technique was utilized including caps, mask, sterile gowns, sterile
gloves, sterile drape, hand hygiene and skin antiseptic. A timeout
was performed prior to the initiation of the procedure.

Ultrasound was used to identify a large amount of left pleural
fluid. The left mid axillary line and left anterior lower chest area
was prepped and draped in sterile fashion. Skin was anesthetized and
2 small incisions were made. 15.5 French PleurX catheter was
tunneled between the incisions and the cuff was placed underneath
the skin. A Yueh catheter was directed into the pleural space along
the more lateral incision with ultrasound guidance. Yellow fluid was
aspirated. Wire was advanced into the pleural space and a peel-away
sheath was placed. Catheter was directed through the peel-away
sheath into the pleural space. 2 L of yellow pleural fluid was
removed. Pleural entrance skin site was closed using absorbable
suture and Dermabond. Catheter was sutured to skin with Prolene
suture.

Fluoroscopic and ultrasound images were taken and saved for
documentation.
IMPRESSION: Successful placement of a tunneled left pleural catheter with
ultrasound and fluoroscopic guidance.

## 2019-01-06 IMAGING — US IR PERC PLEURAL DRAIN W/INDWELL CATH W/IMG GUIDE
1 series · 3 of 3 positions shown · non-contrast
Comparison: none

INDICATION: 57-year-old with lung cancer and malignant left pleural effusion.
Patient was scheduled for a [facility]ed pleural [facility] for radiation therapy but became short of breath and ended
consulted for placement of the tunneled pleural catheter.

[Series 1: ir (id) (id)/(id)/(id) ir · 3 of 3 slices shown]
[im 1/3]
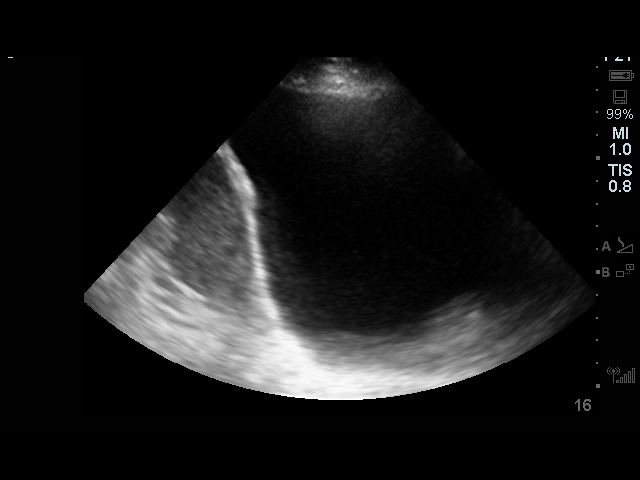
[im 2/3]
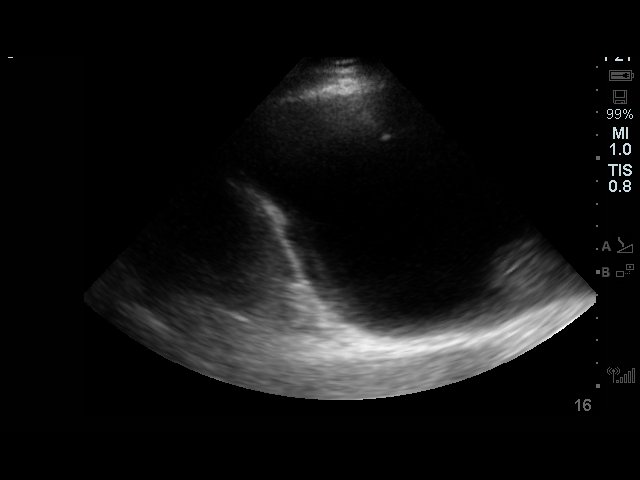
[im 3/3]
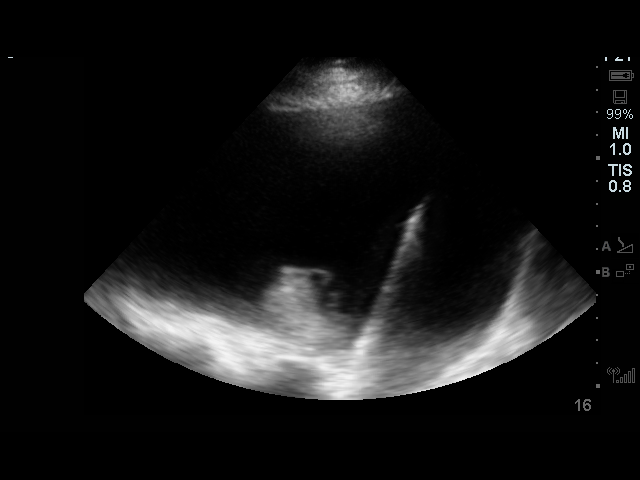

[3 of 3 positions shown; findings below may reference images not displayed]

EXAM:
PLACEMENT OF TUNNELED LEFT PLEURAL CATHETER WITH ULTRASOUND AND
FLUOROSCOPIC GUIDANCE

MEDICATIONS:
Ancef 2 g

ANESTHESIA/SEDATION:
Fentanyl 50 mcg IV; Versed 1 mg IV

Moderate Sedation Time:  24 minutes

The patient was continuously monitored during the procedure by the
interventional radiology nurse under my direct supervision.

FLUOROSCOPY TIME:  30 seconds, 15 mGy

COMPLICATIONS:
None immediate.

PROCEDURE:
Informed written consent was obtained from the patient after a
thorough discussion of the procedural risks, benefits and
alternatives. All questions were addressed. Maximal Sterile Barrier
Technique was utilized including caps, mask, sterile gowns, sterile
gloves, sterile drape, hand hygiene and skin antiseptic. A timeout
was performed prior to the initiation of the procedure.

Ultrasound was used to identify a large amount of left pleural
fluid. The left mid axillary line and left anterior lower chest area
was prepped and draped in sterile fashion. Skin was anesthetized and
2 small incisions were made. 15.5 French PleurX catheter was
tunneled between the incisions and the cuff was placed underneath
the skin. A Yueh catheter was directed into the pleural space along
the more lateral incision with ultrasound guidance. Yellow fluid was
aspirated. Wire was advanced into the pleural space and a peel-away
sheath was placed. Catheter was directed through the peel-away
sheath into the pleural space. 2 L of yellow pleural fluid was
removed. Pleural entrance skin site was closed using absorbable
suture and Dermabond. Catheter was sutured to skin with Prolene
suture.

Fluoroscopic and ultrasound images were taken and saved for
documentation.
IMPRESSION: Successful placement of a tunneled left pleural catheter with
ultrasound and fluoroscopic guidance.

## 2019-01-06 IMAGING — DX PORTABLE CHEST - 1 VIEW
1 series · 1 of 1 positions shown · non-contrast
Comparison: [DATE]

CLINICAL DATA: Shortness of breath, pleural effusion

EXAM:
PORTABLE CHEST 1 VIEW

[chest ap]
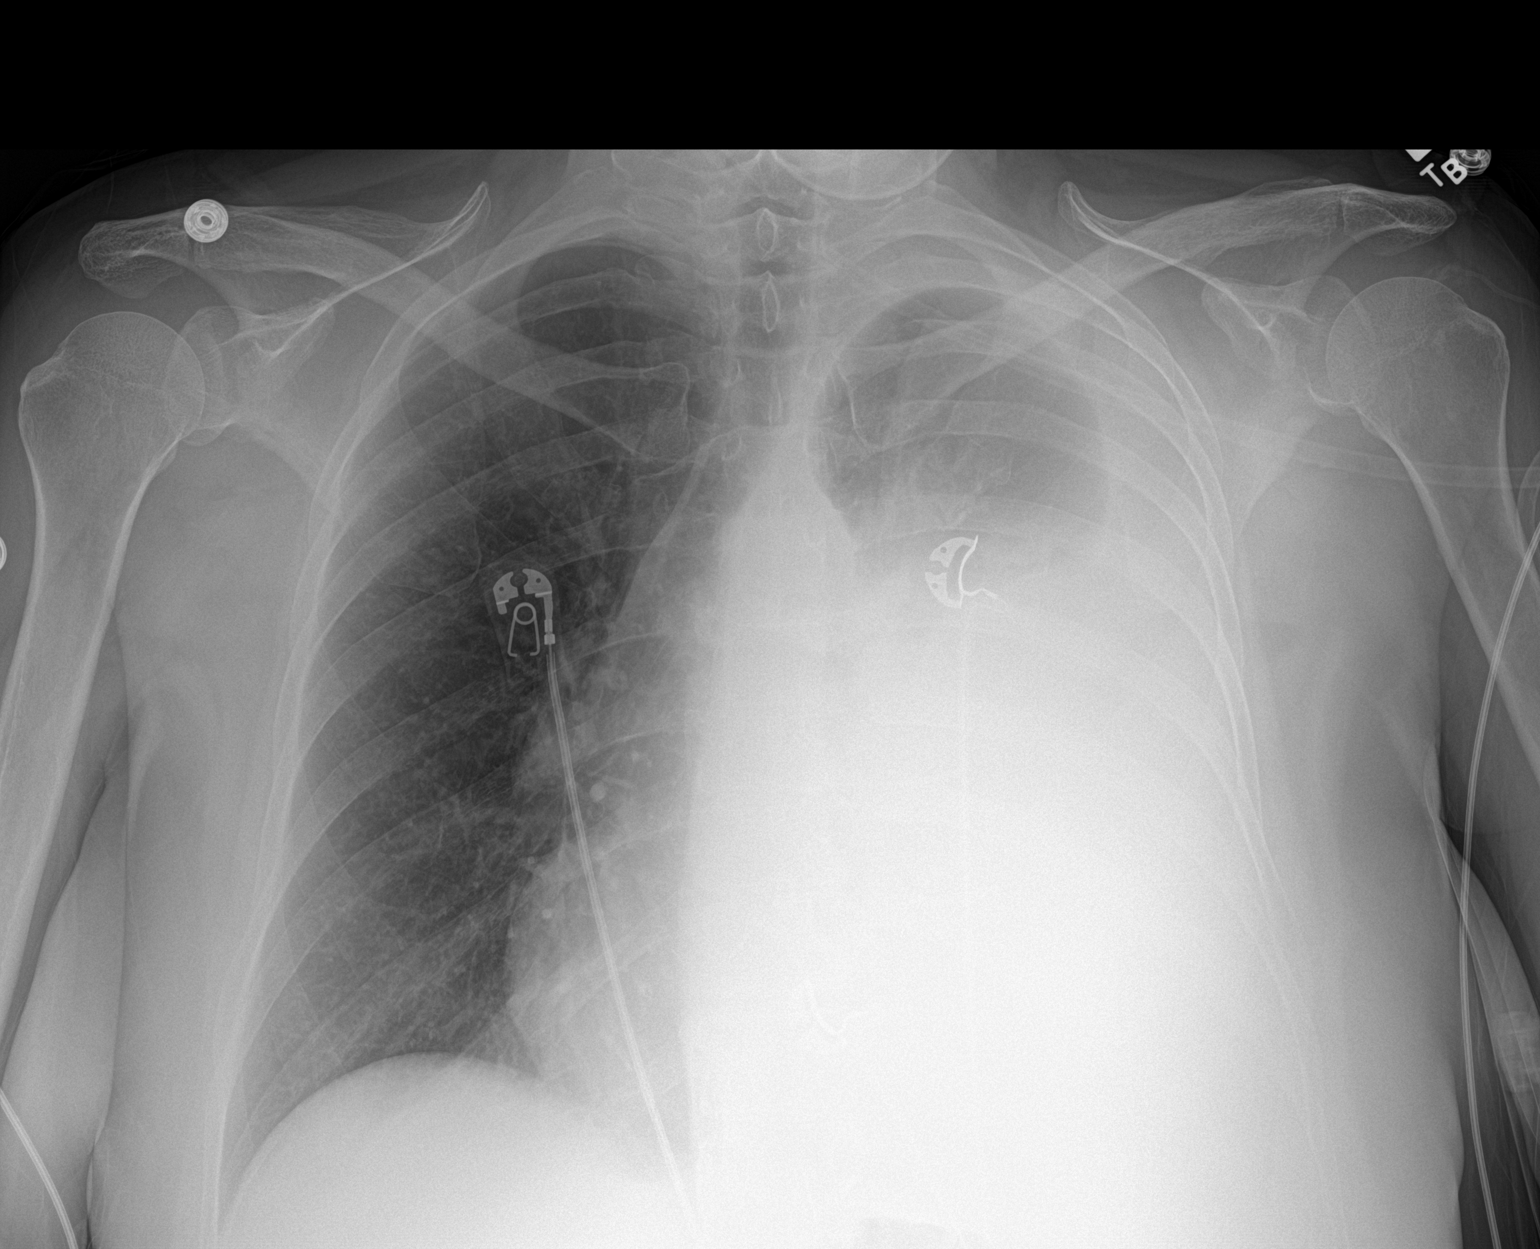

[1 of 1 positions shown; findings below may reference images not displayed]

FINDINGS: Large left pleural effusion unchanged with [DATE]. No right
pleural effusion. No pneumothorax. Stable cardiomediastinal
silhouette. No acute osseous abnormality.
IMPRESSION: 1. Stable large left pleural effusion.

## 2019-01-06 IMAGING — CT CT ANGIOGRAPHY CHEST
2 of 6 series · 18 of 46 positions shown · IV contrast (OMNIPAQUE)
Comparison: Head CT [DATE] and chest CT [DATE]

CLINICAL DATA: Dyspnea with stage IV lung cancer.  Hypoxia.

EXAM:
CT ANGIOGRAPHY CHEST WITH CONTRAST
TECHNIQUE: Multidetector CT imaging of the chest was performed using the
standard protocol during bolus administration of intravenous
contrast. Multiplanar CT image reconstructions and MIPs were
obtained to evaluate the vascular anatomy.
CONTRAST:  100mL OMNIPAQUE IOHEXOL 350 MG/ML SOLN

[Series 6: thins · axial · 0.65mm/px · z∈[+1352,+1599]mm · 15 of 271 slices shown]
[im 12/271  lung]
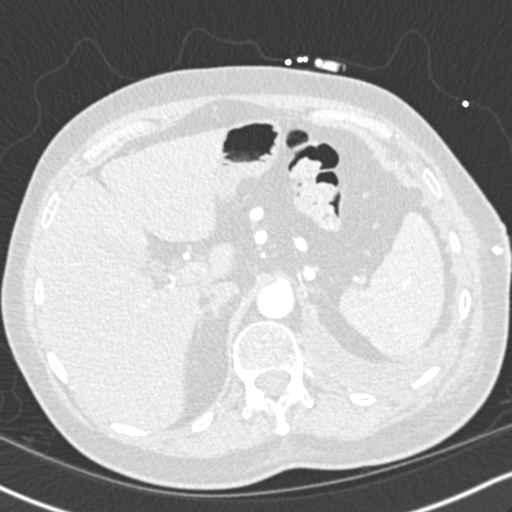
[im 36/271  soft-tissue]
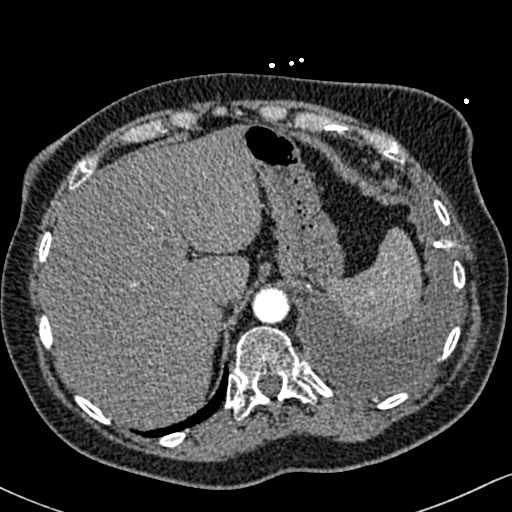
[im 47/271  lung]
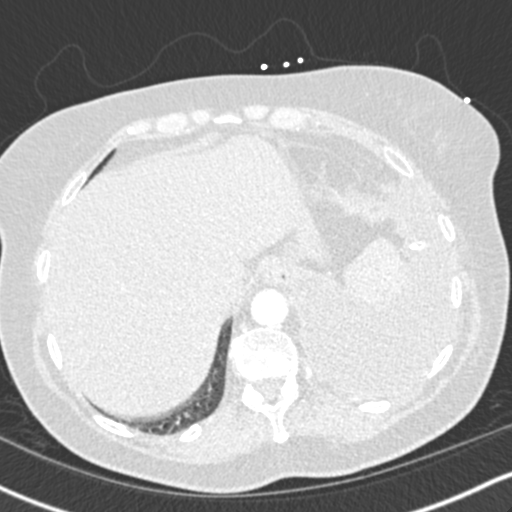
[im 71/271  soft-tissue]
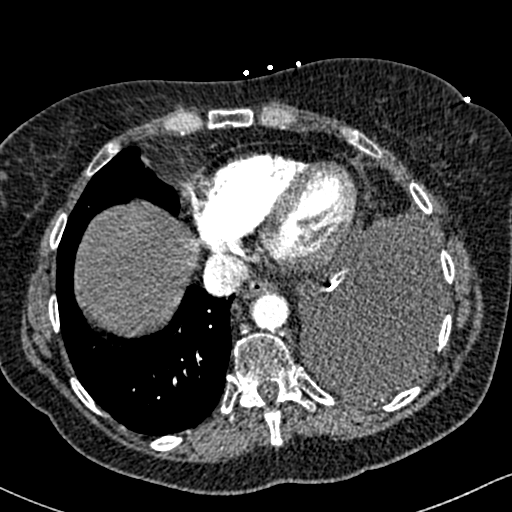
[im 83/271  lung]
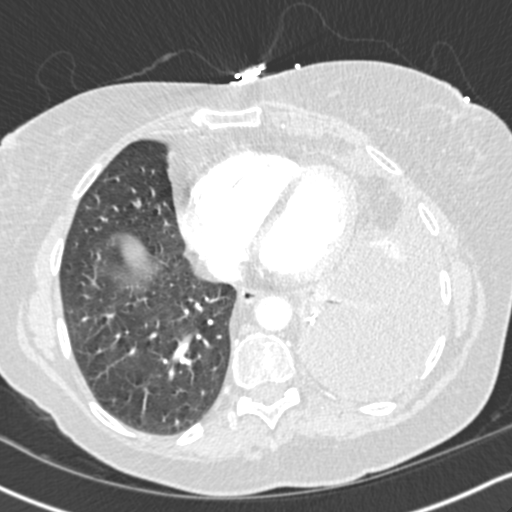
[im 106/271  soft-tissue]
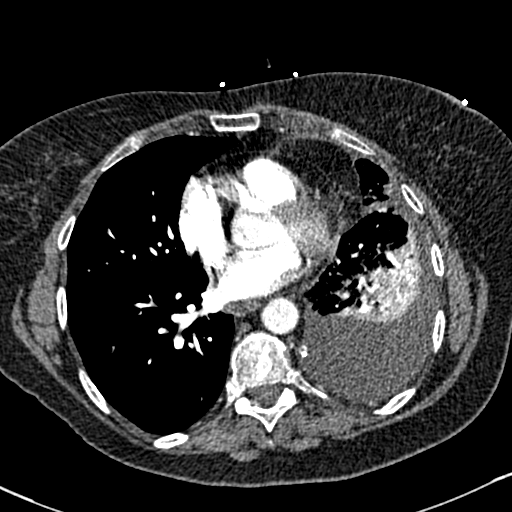
[im 118/271  lung]
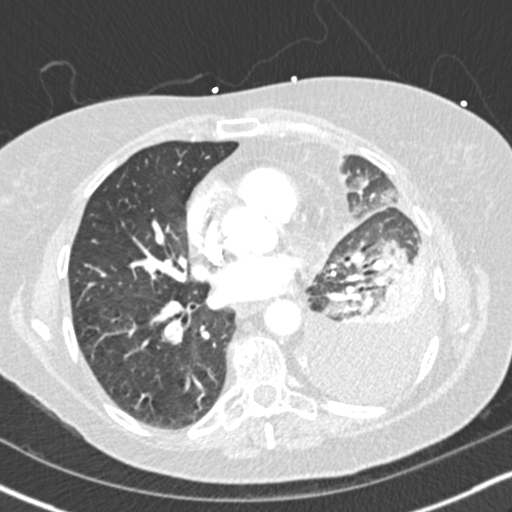
[im 141/271  soft-tissue]
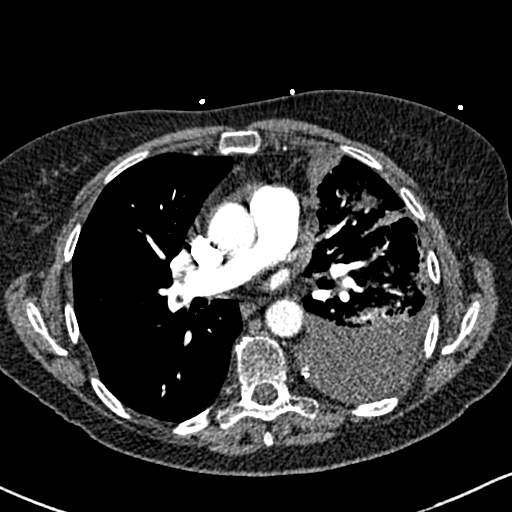
[im 153/271  lung]
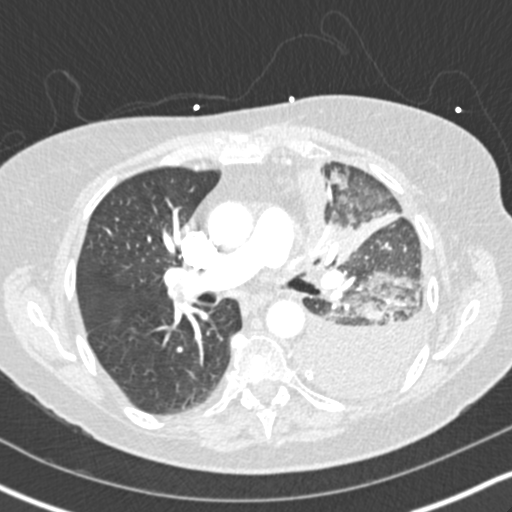
[im 165/271  soft-tissue]
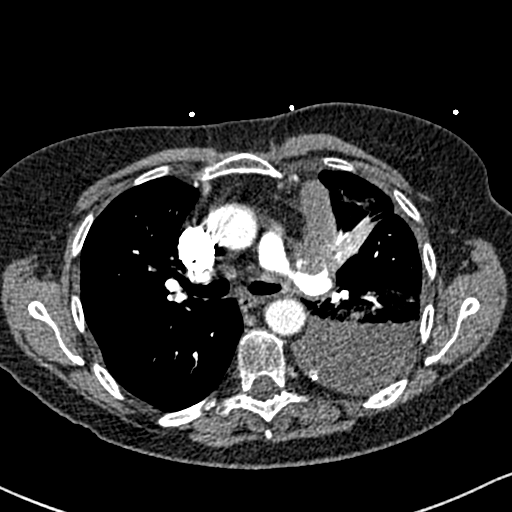
[im 188/271  lung]
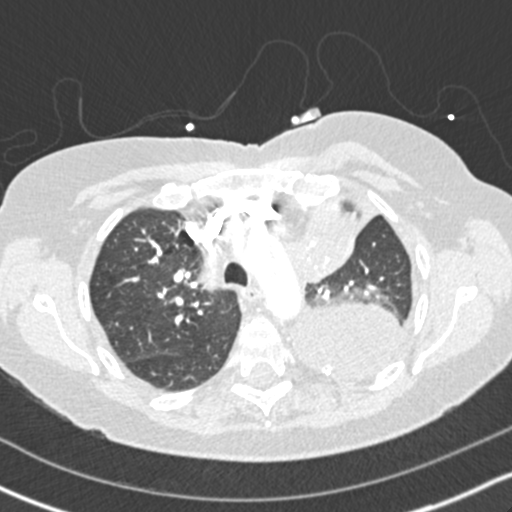
[im 200/271  soft-tissue]
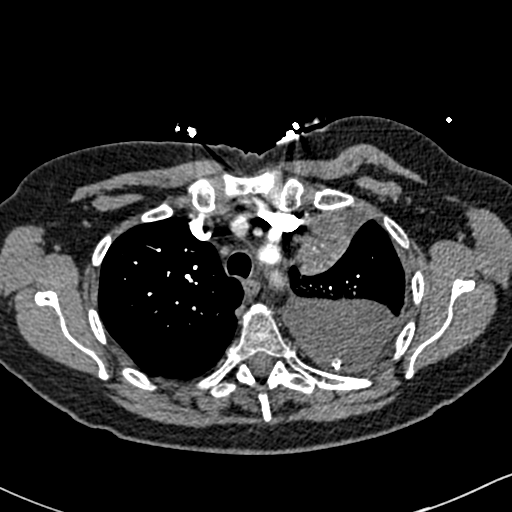
[im 224/271  lung]
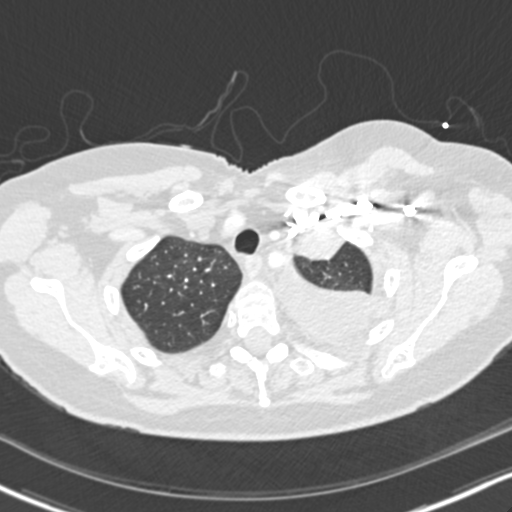
[im 235/271  soft-tissue]
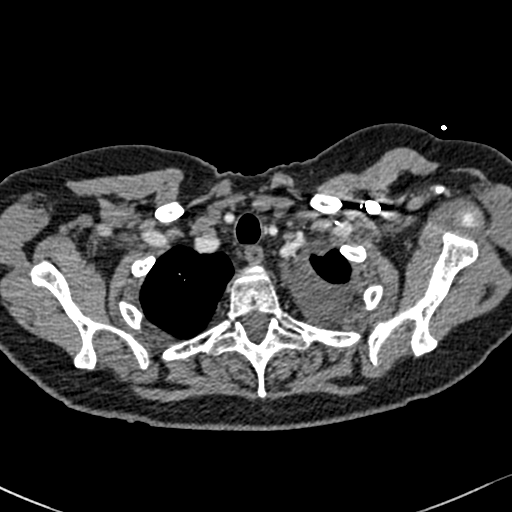
[im 259/271  lung]
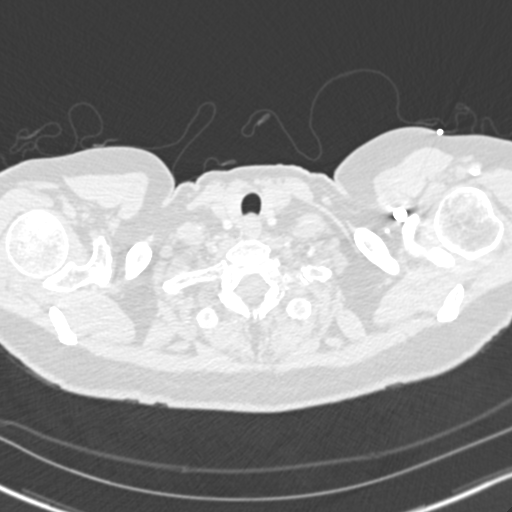

[Series 7: coronal mpr · coronal · 0.56mm/px · 3 of 148 slices shown]
[im 37/148  soft-tissue]
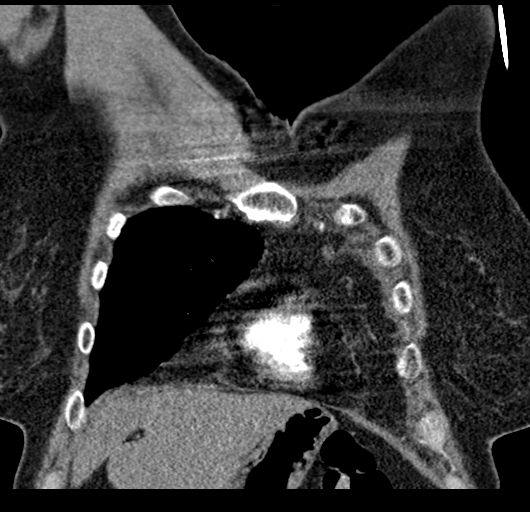
[im 74/148  soft-tissue]
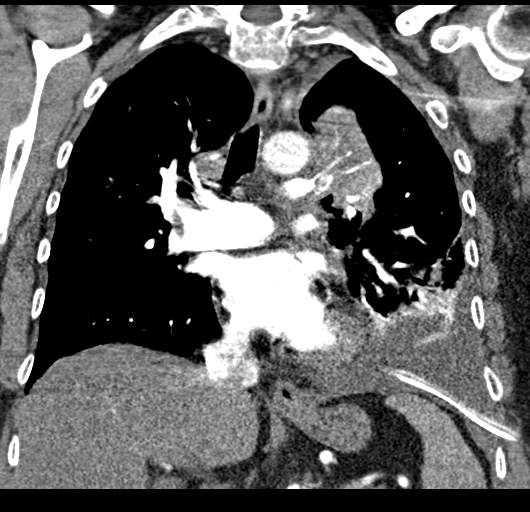
[im 111/148  soft-tissue]
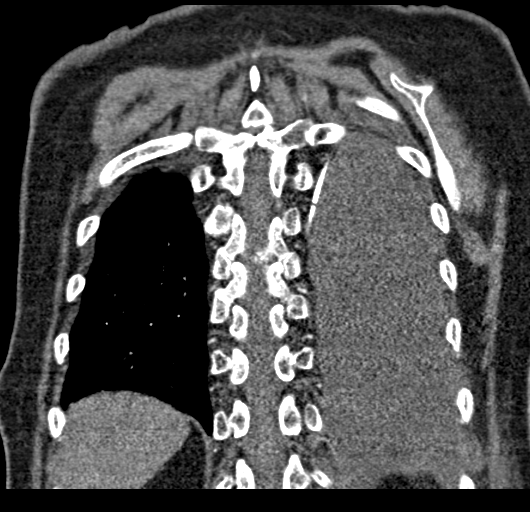

[18 of 46 positions shown; findings below may reference images not displayed]

FINDINGS: Cardiovascular: Conventional branch pattern of the great vessels.
Nonaneurysmal thoracic aorta. Acute pulmonary emboli noted on the
right starting from the bifurcation of the right main pulmonary
artery extending into the right upper and lower lobe lobar and
segmental branches. Right heart strain is identified with RV/LV
ratio 1.2.

Mediastinum/Nodes: Small subcentimeter mediastinal lymph nodes with
previously described metabolically active left-sided supraclavicular
lymph nodes, the largest approximately 9 mm. No thyromegaly or mass.
Patent trachea and mainstem bronchi with slight luminal narrowing of
the distal left upper lobe bronchi from known mass and
consolidation.

Lungs/Pleura: Redemonstration of masslike opacity and
postobstructive consolidation of the medial left upper lobe. The
margins of the mass are difficult to identified due to the adjacent
abutting pulmonary consolidations likely representing
postobstructive change. The degree of postobstructive consolidation
appears slightly increased since prior. Moderate to large left
pleural effusion with adjacent left basilar compressive atelectasis
with atelectasis along the left major fissure is identified. The
volume of fluid in the left hemithorax has increased slightly from
the [DATE] exam though is decreased in appearance relative
to the more recent prior PET-CT from [DATE]. The tip of a Pleurx
catheter terminates at the posterior fourth rib level. Patchy
airspace disease in the left upper lobe, lingula and left lower lobe
can not exclude superimposed pneumonia.

Upper Abdomen: No acute abnormality.

Musculoskeletal: Redemonstration of scattered osseous metastasis, at
the base of the left glenoid, midthoracic spine and ribs better
visualized on prior PET-CT.

Review of the MIP images confirms the above findings.
IMPRESSION: 1. Acute multilobar right-sided pulmonary emboli to the right upper
and lower lobes with right heart strain, RV/LV ratio 1.2.
2. New patchy airspace opacities within the aerated left upper lobe,
lingula and left lower lobe. Pneumonia is not excluded.
3. Redemonstration of left upper lobe mass with postobstructive
consolidation and moderate to large left pleural effusion. PleurX
catheter is noted within effusion.
4. Osseous metastatic disease to the dorsal spine, left scapula and
ribs.

These results were called by telephone at the time of interpretation
on [DATE] at [DATE] to Dr. MARCO ROBERTO , who verbally
acknowledged these results.

## 2019-01-06 IMAGING — DX PORTABLE CHEST - 1 VIEW
1 series · 1 of 1 positions shown · non-contrast
Comparison: Film earlier today at [FG] hours

CLINICAL DATA: Status post left PleurX catheter placement for
malignant pleural effusion secondary to adenocarcinoma of the lung.

EXAM:
PORTABLE CHEST 1 VIEW

[chest ap]
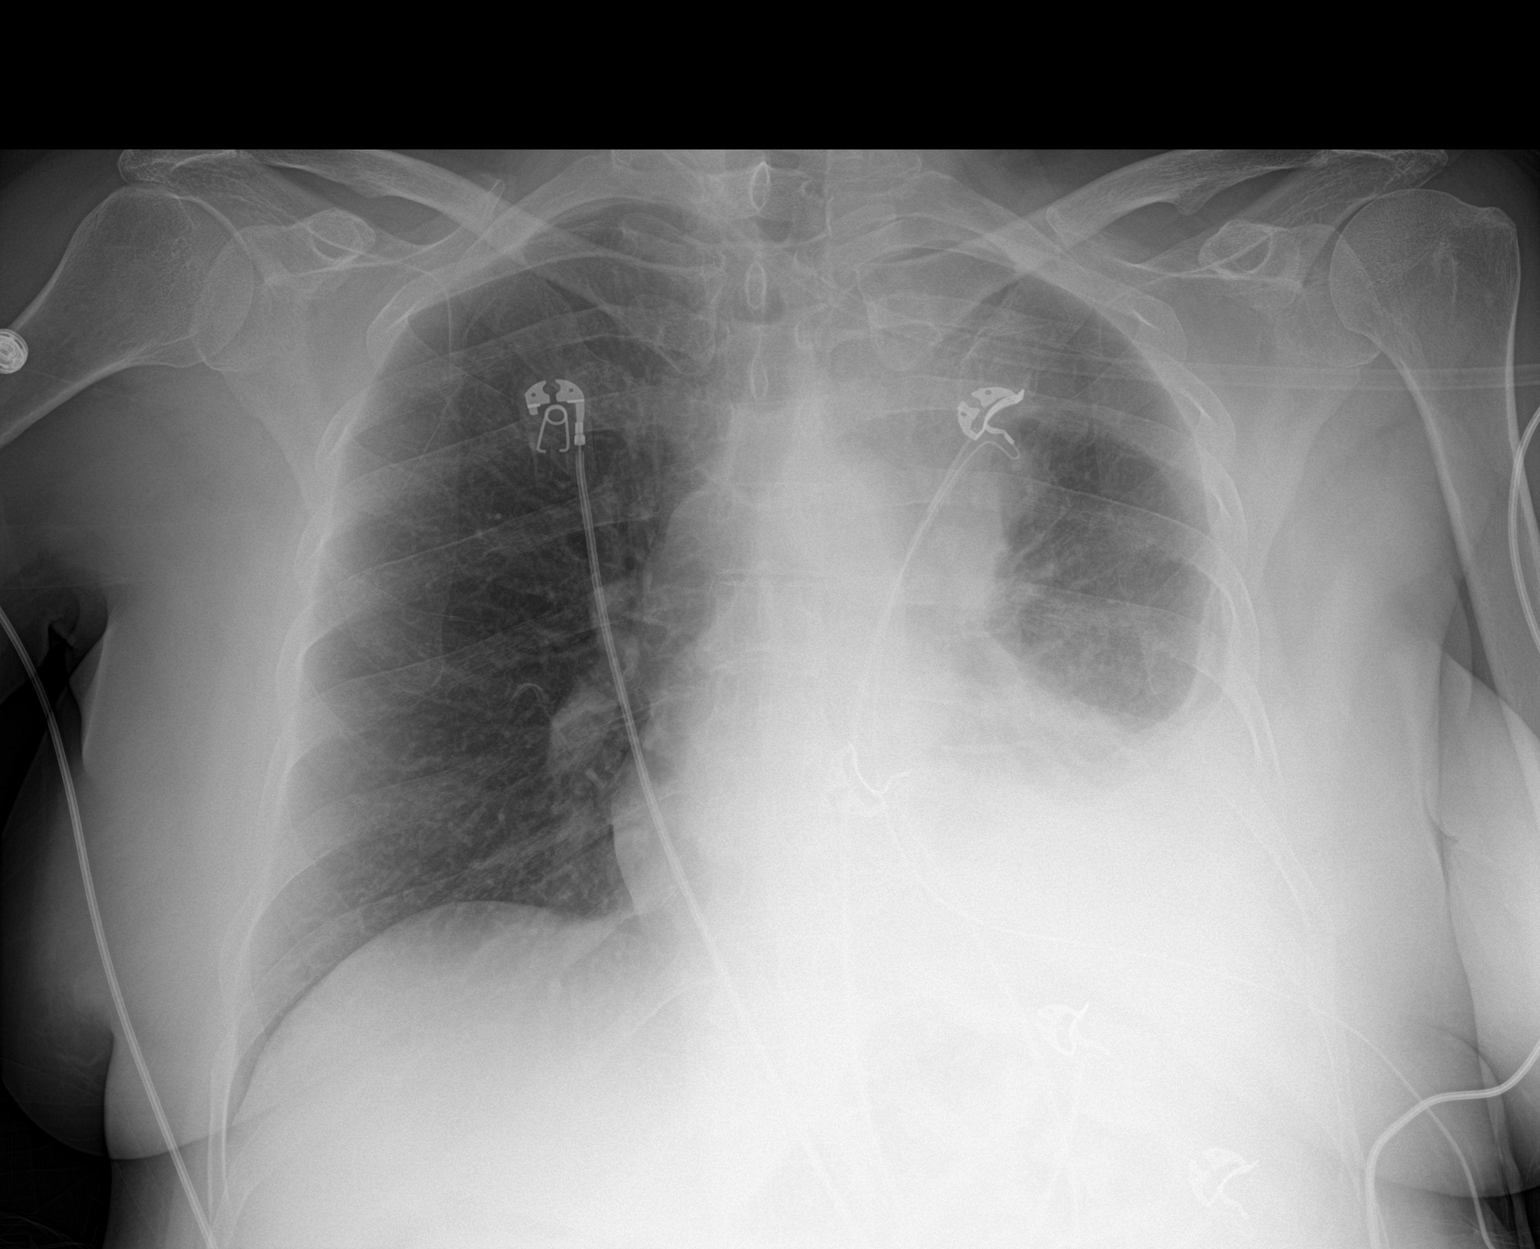

[1 of 1 positions shown; findings below may reference images not displayed]

FINDINGS: Left basilar PleurX catheter present. After fluid removal, there is
reduction in amount of left pleural fluid volume with improved
aeration of the left lung. Small amount of pleural fluid remains. No
pneumothorax. Right lung remains clear.
IMPRESSION: Decrease in left pleural fluid volume after placement of PleurX
catheter and drainage of pleural fluid. No pneumothorax.

## 2019-01-06 SURGERY — INSERTION, PLEURAL DRAINAGE CATHETER
Anesthesia: Monitor Anesthesia Care | Site: Chest | Laterality: Left

## 2019-01-06 MED ORDER — ONDANSETRON HCL 4 MG PO TABS
4.0000 mg | ORAL_TABLET | Freq: Four times a day (QID) | ORAL | Status: DC | PRN
  Administered 2019-01-06: 4 mg via ORAL
  Filled 2019-01-06: qty 1

## 2019-01-06 MED ORDER — MIDAZOLAM HCL 2 MG/2ML IJ SOLN
INTRAMUSCULAR | Status: AC | PRN
  Administered 2019-01-06 (×2): 0.5 mg via INTRAVENOUS

## 2019-01-06 MED ORDER — LIDOCAINE HCL 1 % IJ SOLN
INTRAMUSCULAR | Status: AC | PRN
  Administered 2019-01-06: 10 mL

## 2019-01-06 MED ORDER — LEVALBUTEROL HCL 0.63 MG/3ML IN NEBU: 0.6300 mg | INHALATION_SOLUTION | RESPIRATORY_TRACT | Status: DC | PRN

## 2019-01-06 MED ORDER — LORATADINE 10 MG PO TABS
10.0000 mg | ORAL_TABLET | Freq: Every day | ORAL | Status: DC
  Administered 2019-01-07 – 2019-01-08 (×2): 10 mg via ORAL
  Filled 2019-01-06 (×2): qty 1

## 2019-01-06 MED ORDER — HEPARIN (PORCINE) 25000 UT/250ML-% IV SOLN
1250.0000 [IU]/h | INTRAVENOUS | Status: DC
  Administered 2019-01-06: 1250 [IU]/h via INTRAVENOUS
  Filled 2019-01-06: qty 250

## 2019-01-06 MED ORDER — ONDANSETRON HCL 4 MG/2ML IJ SOLN: 4.0000 mg | Freq: Four times a day (QID) | INTRAMUSCULAR | Status: DC | PRN

## 2019-01-06 MED ORDER — IOHEXOL 350 MG/ML SOLN
100.0000 mL | Freq: Once | INTRAVENOUS | Status: AC | PRN
  Administered 2019-01-06: 100 mL via INTRAVENOUS

## 2019-01-06 MED ORDER — FAMOTIDINE 20 MG PO TABS: 20.0000 mg | ORAL_TABLET | Freq: Every day | ORAL | Status: DC | PRN

## 2019-01-06 MED ORDER — MIDAZOLAM HCL 2 MG/2ML IJ SOLN
INTRAMUSCULAR | Status: AC
  Filled 2019-01-06: qty 2

## 2019-01-06 MED ORDER — POLYETHYLENE GLYCOL 3350 17 G PO PACK
17.0000 g | PACK | Freq: Every day | ORAL | Status: DC
  Administered 2019-01-07 – 2019-01-08 (×2): 17 g via ORAL
  Filled 2019-01-06 (×2): qty 1

## 2019-01-06 MED ORDER — FENTANYL CITRATE (PF) 100 MCG/2ML IJ SOLN
INTRAMUSCULAR | Status: AC | PRN
  Administered 2019-01-06 (×2): 25 ug via INTRAVENOUS

## 2019-01-06 MED ORDER — CEFAZOLIN SODIUM-DEXTROSE 2-4 GM/100ML-% IV SOLN
2.0000 g | INTRAVENOUS | Status: AC
  Administered 2019-01-06: 2 g via INTRAVENOUS
  Filled 2019-01-06: qty 100

## 2019-01-06 MED ORDER — LEVOFLOXACIN IN D5W 750 MG/150ML IV SOLN
750.0000 mg | Freq: Every day | INTRAVENOUS | Status: DC
  Administered 2019-01-07 (×2): 750 mg via INTRAVENOUS
  Filled 2019-01-06 (×2): qty 150

## 2019-01-06 MED ORDER — DEXAMETHASONE 2 MG PO TABS
2.0000 mg | ORAL_TABLET | Freq: Every day | ORAL | Status: DC
  Administered 2019-01-07 – 2019-01-08 (×2): 2 mg via ORAL
  Filled 2019-01-06 (×2): qty 1

## 2019-01-06 MED ORDER — HYDROCODONE-ACETAMINOPHEN 5-325 MG PO TABS
1.0000 | ORAL_TABLET | ORAL | Status: DC | PRN
  Administered 2019-01-06: 1 via ORAL
  Administered 2019-01-07 (×2): 2 via ORAL
  Administered 2019-01-08: 1 via ORAL
  Administered 2019-01-08: 2 via ORAL
  Filled 2019-01-06 (×3): qty 2
  Filled 2019-01-06 (×2): qty 1

## 2019-01-06 MED ORDER — OXYCODONE HCL 5 MG PO TABS: 2.5000 mg | ORAL_TABLET | Freq: Four times a day (QID) | ORAL | Status: DC | PRN

## 2019-01-06 MED ORDER — FENTANYL CITRATE (PF) 100 MCG/2ML IJ SOLN
INTRAMUSCULAR | Status: AC
  Filled 2019-01-06: qty 2

## 2019-01-06 MED ORDER — CEFAZOLIN SODIUM-DEXTROSE 2-4 GM/100ML-% IV SOLN
INTRAVENOUS | Status: AC
  Administered 2019-01-06: 2 g via INTRAVENOUS
  Filled 2019-01-06: qty 100

## 2019-01-06 MED ORDER — MAGNESIUM CITRATE PO SOLN: 0.5000 | Freq: Every day | ORAL | Status: DC | PRN

## 2019-01-06 MED ORDER — LORAZEPAM 1 MG PO TABS: 1.0000 mg | ORAL_TABLET | ORAL | Status: DC | PRN

## 2019-01-06 MED ORDER — ENSURE ENLIVE PO LIQD: 237.0000 mL | Freq: Two times a day (BID) | ORAL | Status: DC

## 2019-01-06 MED ORDER — LEVALBUTEROL TARTRATE 45 MCG/ACT IN AERO: 1.0000 | INHALATION_SPRAY | RESPIRATORY_TRACT | Status: DC | PRN

## 2019-01-06 MED ORDER — IBUPROFEN 200 MG PO TABS: 200.0000 mg | ORAL_TABLET | Freq: Three times a day (TID) | ORAL | Status: DC | PRN

## 2019-01-06 MED ORDER — AZELASTINE HCL 0.1 % NA SOLN
1.0000 | Freq: Two times a day (BID) | NASAL | Status: DC | PRN
  Filled 2019-01-06: qty 30

## 2019-01-06 MED ORDER — LEVOTHYROXINE SODIUM 112 MCG PO TABS
112.0000 ug | ORAL_TABLET | Freq: Every day | ORAL | Status: DC
  Administered 2019-01-07 – 2019-01-08 (×2): 112 ug via ORAL
  Filled 2019-01-06 (×2): qty 1

## 2019-01-06 MED ORDER — LIDOCAINE HCL 1 % IJ SOLN
INTRAMUSCULAR | Status: AC
  Filled 2019-01-06: qty 20

## 2019-01-06 MED ORDER — HEPARIN BOLUS VIA INFUSION
2000.0000 [IU] | Freq: Once | INTRAVENOUS | Status: AC
  Administered 2019-01-06: 2000 [IU] via INTRAVENOUS
  Filled 2019-01-06: qty 2000

## 2019-01-06 MED ORDER — DEXTROSE-NACL 5-0.45 % IV SOLN
INTRAVENOUS | Status: DC
  Administered 2019-01-07: via INTRAVENOUS

## 2019-01-06 MED ORDER — ACETAMINOPHEN 325 MG PO TABS: 325.0000 mg | ORAL_TABLET | Freq: Four times a day (QID) | ORAL | Status: DC | PRN

## 2019-01-06 NOTE — Procedures (Signed)
Interventional Radiology Procedure:   Indications: Malignant left pleural effusion  Procedure: Tunneled left pleural catheter  Findings: Large left pleural effusion, 15.5 Fr Pleurx catheter placed in left pleural space and 2 liters of yellow pleural fluid removed.  Complications: none     EBL: Less than 10 ml  Plan: CXR in ED.  Can be discharged from ED.     Saeed Toren R. Anselm Pancoast, MD  Pager: [phone]

## 2019-01-06 NOTE — Progress Notes (Signed)
Patient at [facility] ED, [place] desk contacted and made aware.

## 2019-01-06 NOTE — Progress Notes (Signed)
Symptoms Management Clinic Progress Note   Tina Cruz [id number] 10-12-1961 58 y.o.  Tina Cruz is managed by Dr. Fanny Bien. Tina Cruz  Actively treated with chemotherapy/immunotherapy/hormonal therapy: No  Current therapy: Patient is receiving radiation therapy.  Next scheduled appointment with provider: 01/09/2019  Assessment: Plan:    Hypoxia  Pleural effusion on left  Adenocarcinoma of left lung, stage 4 ([place])  Bone metastasis ([place])  Brain metastasis ([place])   Hypoxia: The patient was seen acutely as she was preparing to go to radiation therapy today.  She declined a wheelchair and walked into the cancer center on her own.  She became acutely short of breath and yell for help and she was preparing to get on the elevator.  She was found to be hypoxic with oxygen saturation 72% on room air.  She was also noted to be tachycardic.  She does not have home O2.  She was placed on oxygen via nasal cannula at 2 L/min initially which did not significantly change her oxygen saturation.  This was increased to 4 L/min.  Despite this her oxygen saturation continued to be no better than the upper 70s to low 80s.  She was then placed on a nonrebreather mask at 8 to 10 L/min of oxygen.  Her oxygen saturation was no better than 88 to 90%.  Due to this she was taken to the emergency room for evaluation and management.  Left pleural effusion: The patient was scheduled to have a Pleurx catheter placed today at [facility].  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 Julien Nordmann and is currently receiving radiation therapy.  Please see After Visit Summary for patient specific instructions.  Future Appointments  Date Time Provider [place]  01/09/2019 11:00 AM [facility] LAB 3 [facility] None  01/09/2019 11:30 AM [name], MD [facility] None  01/09/2019 11:45 AM [facility] LINAC 3 [facility] None   01/10/2019 10:25 AM [facility] LINAC 3 [facility] None  02/09/2019 10:45 AM Gery Pray, MD [facility] None  04/05/2019 11:20 AM Eppie Gibson, MD [facility] None    No orders of the defined types were placed in this encounter.      Subjective:   Patient ID:  Tina Cruz is a 58 y.o. (DOB Aug 18, 1961) female.  Chief Complaint: No chief complaint on file.   HPI Tina Cruz is a 58 year old female who is managed by Dr. Julien Nordmann and is currently treated with radiation therapy.  This provider was called to see the patient as she was preparing to go to radiation therapy today.  She elected to walk in on her own without the use of a wheelchair when she became acutely short of breath and yelled for help as she was preparing to get on the elevator.  She was found to be hypoxic with oxygen saturation 72% on room air.  She was also noted to be tachycardic at 125 bpm.  She used her albuterol inhaler.  She does not have home O2.  She was placed on oxygen via nasal cannula at 2 L/min initially which did not significantly change her oxygen saturation.  This was increased to 4 L/min.  Despite this her oxygen saturation continued to be no better than the upper 70s to low 80s.  She was then placed on a nonrebreather mask at 8 to 10 L/min of oxygen.  Her oxygen saturation was no better than 88 to 90%.  Due to this she was  taken to the emergency room for evaluation and management.  She has a history of a left pleural effusion and was scheduled to have a Pleurx catheter placed today at [facility].  She was taken to the emergency room for evaluation and management.    Medications: I have reviewed the patient's current medications.  Allergies:  Allergies  Allergen Reactions  . Albuterol Hives and Itching    Past Medical History:  Diagnosis Date  . Active smoker    quit 2016  . Cancer ([place])    left lung stage IV non-cell carcinoma/adenocarcinoma with diagnosis made from left  pleural effusion.    . Cancer ([place])    Brain metastasis   . Constipation   . Depression   . Dyspnea   . GERD (gastroesophageal reflux disease)   . H/O chronic bronchitis   . Hypothyroidism   . Seasonal allergies   . SVD (spontaneous vaginal delivery)    x 2  . Thyroid disease    hypothyroid  . Wears partial dentures     Past Surgical History:  Procedure Laterality Date  . COLONOSCOPY     x 2  . TONSILLECTOMY    . TUBAL LIGATION      Family History  Problem Relation Age of Onset  . Cancer Mother        colon  . Alcohol abuse Father     Social History   Socioeconomic History  . Marital status: Married    Spouse name: Not on file  . Number of children: Not on file  . Years of education: Not on file  . Highest education level: Not on file  Occupational History  . Not on file  Social Needs  . Financial resource strain: Not on file  . Food insecurity:    Worry: Not on file    Inability: Not on file  . Transportation needs:    Medical: No    Non-medical: No  Tobacco Use  . Smoking status: Former Smoker    Packs/day: 0.50    Years: 5.00    Pack years: 2.50    Types: Cigarettes    Last attempt to quit: 06/03/2015    Years since quitting: 3.5  . Smokeless tobacco: Never Used  Substance and Sexual Activity  . Alcohol use: No    Alcohol/week: 0.0 standard drinks  . Drug use: No  . Sexual activity: Not on file  Lifestyle  . Physical activity:    Days per week: Not on file    Minutes per session: Not on file  . Stress: Not on file  Relationships  . Social connections:    Talks on phone: Not on file    Gets together: Not on file    Attends religious service: Not on file    Active member of club or organization: Not on file    Attends meetings of clubs or organizations: Not on file    Relationship status: Not on file  . Intimate partner violence:    Fear of current or ex partner: No    Emotionally abused: No    Physically abused: No    Forced sexual  activity: No  Other Topics Concern  . Not on file  Social History Narrative  . Not on file    Past Medical History, Surgical history, Social history, and Family history were reviewed and updated as appropriate.   Please see review of systems for further details on the patient's review from today.   Review  of Systems:  Review of Systems  Constitutional: Negative for chills, diaphoresis and fever.  HENT: Negative for trouble swallowing.   Respiratory: Positive for shortness of breath. Negative for cough, choking, chest tightness, wheezing and stridor.   Cardiovascular: Negative for chest pain and palpitations.    Objective:   Physical Exam:  LMP  (LMP Unknown)  ECOG: 1  Physical Exam Constitutional:      General: She is in acute distress.     Appearance: She is not diaphoretic.  HENT:     Head: Normocephalic and atraumatic.  Cardiovascular:     Rate and Rhythm: Regular rhythm. Tachycardia present.     Heart sounds: Heart sounds not distant. No murmur.  Pulmonary:     Breath sounds: Examination of the left-middle field reveals decreased breath sounds. Examination of the left-lower field reveals decreased breath sounds. Decreased breath sounds present.     Comments: Decreased auscultation and percussion over the left mid and lower lung. Skin:    General: Skin is warm and dry.  Neurological:     Mental Status: She is alert.     Lab Review:     Component Value Date/Time   NA 139 01/06/2019 1253   K 3.7 01/06/2019 1253   CL 103 01/06/2019 1253   CO2 25 01/06/2019 1253   GLUCOSE 142 (H) 01/06/2019 1253   BUN 12 01/06/2019 1253   CREATININE 0.64 01/06/2019 1253   CREATININE 0.84 11/04/2018 0938   CALCIUM 9.3 01/06/2019 1253   PROT 7.1 01/06/2019 1253   ALBUMIN 4.0 01/06/2019 1253   AST 27 01/06/2019 1253   ALT 38 01/06/2019 1253   ALKPHOS 81 01/06/2019 1253   BILITOT 1.6 (H) 01/06/2019 1253   GFRNONAA >60 01/06/2019 1253   GFRNONAA 77 11/04/2018 0938   GFRAA >60  01/06/2019 1253   GFRAA 89 11/04/2018 0938       Component Value Date/Time   WBC 12.5 (H) 01/06/2019 1253   RBC 4.51 01/06/2019 1253   HGB 14.1 01/06/2019 1253   HGB 13.5 12/14/2018 1436   HCT 41.8 01/06/2019 1253   PLT 215 01/06/2019 1253   PLT 370 12/14/2018 1436   MCV 92.7 01/06/2019 1253   MCH 31.3 01/06/2019 1253   MCHC 33.7 01/06/2019 1253   RDW 13.3 01/06/2019 1253   LYMPHSABS 0.2 (L) 01/06/2019 1253   MONOABS 0.6 01/06/2019 1253   EOSABS 0.0 01/06/2019 1253   BASOSABS 0.0 01/06/2019 1253   -------------------------------  Imaging from last 24 hours (if applicable):  Radiology interpretation: Dg Chest 2 View  Result Date: 01/04/2019 CLINICAL DATA:  Malignant pleural effusion. EXAM: CHEST - 2 VIEW COMPARISON:  12/12/2018 FINDINGS: Increased densities throughout the left chest compatible with a large left pleural effusion. There appears to be mediastinal shift towards the right due to the large effusion. Left pleural effusion has enlarged from the prior imaging and there is a small amount of residual aeration in the left upper lung. Right lung is clear. No acute bone abnormality. Left side of the heart is obscured by the large left pleural effusion. IMPRESSION: Large left pleural effusion. Pleural effusion has enlarged since the PET-CT on 12/12/2018. Right mediastinal shift due to the large left pleural effusion. Electronically Signed   By: Markus Daft M.D.   On: 01/04/2019 12:27   Mr Jeri Cos WF Contrast  Result Date: 12/25/2018 CLINICAL DATA:  S RS brain study. Treatment planning for new diagnosis of non-small cell lung cancer. Right parietal metastasis shown on previous exam.  Calvarial metastases shown on previous exam. Left frontal metastasis on previous exam. EXAM: MRI HEAD WITHOUT AND WITH CONTRAST TECHNIQUE: Multiplanar, multiecho pulse sequences of the brain and surrounding structures were obtained without and with intravenous contrast. CONTRAST:  7mL MULTIHANCE  GADOBENATE DIMEGLUMINE 529 MG/ML IV SOLN COMPARISON:  12/07/2018 FINDINGS: Brain: The brainstem and cerebellum are normal. Cerebral hemispheres show minimal small vessel change of the white matter without significant ischemic changes. There is a 13 x 14 mm metastasis in what probably represents the precentral gyrus of the posterior frontal lobe. There is moderate surrounding edema. There is a second probable metastasis in the left frontal lobe, axial image 92, measuring 2-3 mm. There is an adjacent vessel, but I think this is probably a small metastasis. No third lesion is identified. No evidence of hydrocephalus or extra-axial collection. Vascular: Major vessels at the base of the brain show flow. Skull and upper cervical spine: Multiple calvarial metastases as noted by arrows. Sinuses/Orbits: Clear/normal Other: None IMPRESSION: 13 x 14 mm metastasis with surrounding vasogenic edema in the posterior right frontal lobe axial image 117. Probable 2-3 mm metastasis in the left frontal lobe axial image 92. Multiple calvarial metastases as marked by arrows. Electronically Signed   By: Nelson Chimes M.D.   On: 12/25/2018 14:36   Mr Jeri Cos UR Contrast  Result Date: 12/07/2018 CLINICAL DATA:  Solitary pulmonary nodule.  Staging lung cancer EXAM: MRI HEAD WITHOUT AND WITH CONTRAST TECHNIQUE: Multiplanar, multiecho pulse sequences of the brain and surrounding structures were obtained without and with intravenous contrast. CONTRAST:  8 mL Gadovist IV COMPARISON:  None. FINDINGS: Brain: 14 mm enhancing mass in the right frontal parietal lobe compatible with metastatic disease. Mild to moderate surrounding vasogenic edema. This is near the motor strip. Subtle 2 mm enhancing nodule left frontal cortex also likely due to metastatic disease. Ventricle size normal. No midline shift. Negative for hemorrhage. No acute infarct. Minimal chronic white matter changes. Vascular: Normal arterial flow voids Skull and upper cervical  spine: Multiple skull lesions are present suspicious for metastatic disease. The most definitive is in the left occipital bone which shows restricted diffusion and enhancement. Other lesions are suspicious. Sinuses/Orbits: Paranasal sinuses clear. Right mastoid effusion. Normal orbit bilaterally. Other: None IMPRESSION: 14 mm metastatic deposit right frontal parietal lobe with surrounding edema. 2 mm left frontal lesion highly suspicious for metastatic disease Multiple skull lesions suspicious for metastatic disease. Left occipital skull lesion is very suspicious for metastatic disease These results will be called to the ordering clinician or representative by the Radiologist Assistant, and communication documented in the PACS or zVision Dashboard. Electronically Signed   By: Franchot Gallo M.D.   On: 12/07/2018 16:59   Nm Pet Image Initial (pi) Skull Base To Thigh  Result Date: 12/12/2018 CLINICAL DATA:  Initial treatment strategy for LEFT upper lobe mass EXAM: NUCLEAR MEDICINE PET SKULL BASE TO THIGH TECHNIQUE: Nine point mCi F-18 FDG was injected intravenously. Full-ring PET imaging was performed from the skull base to thigh after the radiotracer. CT data was obtained and used for attenuation correction and anatomic localization. Fasting blood glucose: 94 mg/dl COMPARISON:  CT 11/11/2018 FINDINGS: Mediastinal blood pool activity: SUV max 2.5 NECK: Hypermetabolic LEFT supraclavicular node with SUV max equal 7.4 measures 10 mm short axis (image 55/4) Incidental CT findings: None CHEST: A partially calcified hypermetabolic LEFT suprahilar mass surrounds the LEFT upper lobe bronchus. Mass is intensely hypermetabolic with SUV max equal 12.9. Mass measures approximately 3.5 x 2.5 cm.  Post obstructive segmental collapse of the LEFT upper lobe. There is hypermetabolic nodal extension to the mediastinum with a hypermetabolic AP window node which difficult define on the CT portion but evident on the PET data. Along the  most inferior aspect of the pleural surface of the LEFT hemithorax there is hypermetabolic rim of metabolic activity (image 94 for example). Large LEFT effusion. Incidental CT findings: Large LEFT pleural effusion ABDOMEN/PELVIS: Focal metabolic activity of the RIGHT adrenal gland with SUV max equal 6.3. Minimal enlargement the body to 8 mm. LEFT adrenal gland appears normal. No abnormal activity in liver. No hypermetabolic abdominopelvic lymph nodes. Incidental CT findings: none SKELETON: Multiple foci of intense focal metabolic activity within the spine, sacrum pelvis and including the RIGHT femur and LEFT scapula. Example lesion in the transverse process of the T2 vertebral body on the LEFT with SUV max equal 11.9. There is destructive lesion at this level. Example lesion in the LEFT acetabulum with SUV max equal 15.0. Subtle lucent lesion. Example lesion in the L 3 vertebral body with SUV max equal 17.9. Subtle lytic lesion measuring 2 cm this level. Incidental CT findings: No pathologic fracture IMPRESSION: 1. Intensely hypermetabolic LEFT suprahilar mass consistent primary bronchogenic carcinoma. Post obstructive collapse of the LEFT upper lobe. 2. Evidence of hypermetabolic nodal metastasis including the AP window and LEFT supraclavicular node. LEFT supraclavicular node may be amenable to biopsy. 3. Evidence of pleural metastasis in the LEFT hemithorax. Large LEFT pleural effusion. 4. Multiple foci of hypermetabolic lytic skeletal metastasis in the spine, scapula, pelvis, and RIGHT femur. 5. High concern for small RIGHT adrenal metastasis. Electronically Signed   By: Suzy Bouchard M.D.   On: 12/12/2018 10:15   Dg Chest Portable 1 View  Result Date: 01/06/2019 CLINICAL DATA:  Shortness of breath, pleural effusion EXAM: PORTABLE CHEST 1 VIEW COMPARISON:  01/04/2019 FINDINGS: Large left pleural effusion unchanged with 01/04/2019. No right pleural effusion. No pneumothorax. Stable cardiomediastinal  silhouette. No acute osseous abnormality. IMPRESSION: 1. Stable large left pleural effusion. Electronically Signed   By: Kathreen Devoid   On: 01/06/2019 12:49

## 2019-01-06 NOTE — H&P (Signed)
Referring Physician(s): Pickering,N/Mohamed,M/Van Trigt,P  Supervising Physician: Markus Daft  Patient Status:  [facility] ED  Chief Complaint: Lung cancer, dyspnea, left pleural effusion  Subjective: Patient familiar to IR service from left thoracentesis on 12/06/2018 yielding 1.5 L fluid.  She has a history of stage IV non-small cell carcinoma/adenocarcinoma with diagnosis made from the left pleural effusion.  Since the thoracentesis the patient has had significant reaccumulation of left pleural fluid and was scheduled for OP Pleurx catheter placement today by Dr. Prescott Gum  at [facility].  She presented to the [facility] today for radiation but due to significant dyspnea was sent to the emergency room for further evaluation.  Chest x-ray confirms stable large left pleural effusion.  Following discussions with patient/patient's clinicians decision made for IR to place left Pleurx catheter today.  She currently denies fever, headache, abdominal pain, nausea, vomiting or bleeding.  She does have dyspnea, occasional cough, left-sided chest/flank discomfort.  Additional history as below.  Past Medical History:  Diagnosis Date  . Active smoker    quit 2016  . Cancer ([place])    left lung stage IV non-cell carcinoma/adenocarcinoma with diagnosis made from left pleural effusion.    . Cancer ([place])    Brain metastasis   . Constipation   . Depression   . Dyspnea   . GERD (gastroesophageal reflux disease)   . H/O chronic bronchitis   . Hypothyroidism   . Seasonal allergies   . SVD (spontaneous vaginal delivery)    x 2  . Thyroid disease    hypothyroid  . Wears partial dentures    Past Surgical History:  Procedure Laterality Date  . COLONOSCOPY     x 2  . TONSILLECTOMY    . TUBAL LIGATION         Allergies: Albuterol  Medications: Prior to Admission medications   Medication Sig Start Date End Date Taking? Authorizing Provider  acetaminophen (TYLENOL) 325 MG tablet Take 325 mg  by mouth every 6 (six) hours as needed for mild pain or headache.   Yes [provider]  azelastine (ASTELIN) 0.1 % nasal spray Place 1 spray into both nostrils 2 (two) times daily as needed for rhinitis. Use in each nostril as directed 11/02/18  Yes Delsa Grana, PA-C  Cetirizine HCl (ZYRTEC ALLERGY) 10 MG CAPS Take 1 capsule (10 mg total) by mouth daily as needed (allergies). 07/27/18  Yes Dena Billet B, PA-C  dexamethasone (DECADRON) 4 MG tablet Take 1 tablet (4 mg total) by mouth 2 (two) times daily. Patient taking differently: Take 2 mg by mouth daily.  12/14/18  Yes [name], MD  famotidine (PEPCID) 20 MG tablet Take 20 mg by mouth daily as needed for heartburn.    Yes [provider]  ibuprofen (ADVIL) 200 MG tablet Take 200 mg by mouth every 8 (eight) hours as needed for moderate pain.    Yes [provider]  levalbuterol (XOPENEX HFA) 45 MCG/ACT inhaler Inhale 1-2 puffs into the lungs every 4 (four) hours as needed for wheezing or shortness of breath. 09/27/18  Yes Delsa Grana, PA-C  levothyroxine (SYNTHROID, LEVOTHROID) 112 MCG tablet TAKE 1 TABLET (112 MCG TOTAL) BY MOUTH DAILY BEFORE BREAKFAST. 08/03/18  Yes Susy Frizzle, MD  LORazepam (ATIVAN) 1 MG tablet Take 1 tablet about 30 min before wearing mask for brain treatment. Must have a driver after taking this. Take 1 tab PRN before MRIs in the future, if feeling anxious. 12/27/18  Yes Eppie Gibson,  MD  magnesium citrate SOLN Take 0.5 Bottles by mouth daily as needed for moderate constipation or severe constipation.   Yes [provider]  oxyCODONE (OXY IR/ROXICODONE) 5 MG immediate release tablet Take 1 tablet (5 mg total) by mouth 2 (two) times daily. Patient taking differently: Take 0.5 mg by mouth 4 (four) times daily as needed for moderate pain.  01/03/19  Yes Gery Pray, MD  polyethylene glycol (MIRALAX / GLYCOLAX) packet Take 17 g by mouth daily.    Yes [provider]      Vital Signs: BP (!) 137/94   Pulse (!) 124   Temp 98.1 F (36.7 C) (Oral)   Resp (!) 25   LMP  (LMP Unknown)   SpO2 94%   Physical Exam patient awake, alert.  Chest with diminished breath sounds on the left , right clear.  Heart with tachycardic but regular rhythm.  Abdomen soft, positive bowel sounds, nontender.  No lower extremity edema.  Imaging: Dg Chest 2 View  Result Date: 01/04/2019 CLINICAL DATA:  Malignant pleural effusion. EXAM: CHEST - 2 VIEW COMPARISON:  12/12/2018 FINDINGS: Increased densities throughout the left chest compatible with a large left pleural effusion. There appears to be mediastinal shift towards the right due to the large effusion. Left pleural effusion has enlarged from the prior imaging and there is a small amount of residual aeration in the left upper lung. Right lung is clear. No acute bone abnormality. Left side of the heart is obscured by the large left pleural effusion. IMPRESSION: Large left pleural effusion. Pleural effusion has enlarged since the PET-CT on 12/12/2018. Right mediastinal shift due to the large left pleural effusion. Electronically Signed   By: Markus Daft M.D.   On: 01/04/2019 12:27   Dg Chest Portable 1 View  Result Date: 01/06/2019 CLINICAL DATA:  Shortness of breath, pleural effusion EXAM: PORTABLE CHEST 1 VIEW COMPARISON:  01/04/2019 FINDINGS: Large left pleural effusion unchanged with 01/04/2019. No right pleural effusion. No pneumothorax. Stable cardiomediastinal silhouette. No acute osseous abnormality. IMPRESSION: 1. Stable large left pleural effusion. Electronically Signed   By: Kathreen Devoid   On: 01/06/2019 12:49    Labs:  CBC: Recent Labs    11/04/18 0938 12/14/18 1249 12/14/18 1436 01/06/19 1253  WBC 12.2* 11.6* 13.4* 12.5*  HGB 14.6 13.3 13.5 14.1  HCT 42.6 38.9 39.4 41.8  PLT 314 342 370 215    COAGS: No results for input(s): INR, APTT in the last 8760 hours.  BMP: Recent Labs    07/27/18 1102 11/04/18 0938  12/14/18 1324 01/06/19 1253  NA 139 141 138 139  K 4.5 3.9 4.1 3.7  CL 103 106 104 103  CO2 25 25 25 25   GLUCOSE 102* 104* 103* 142*  BUN 12 13 10 12   CALCIUM 9.6 9.8 9.7 9.3  CREATININE 0.87 0.84 0.85 0.64  GFRNONAA 74 77 >60 >60  GFRAA 86 89 >60 >60    LIVER FUNCTION TESTS: Recent Labs    07/27/18 1102 11/04/18 0938 12/14/18 1324 01/06/19 1253  BILITOT 1.3* 1.0 1.2 1.6*  AST 20 16 22 27   ALT 30* 28 24 38  ALKPHOS  --   --  68 81  PROT 6.9 6.6 7.2 7.1  ALBUMIN  --   --  4.3 4.0    Assessment and Plan: Pt with history of stage IV non-small cell carcinoma/adenocarcinoma with diagnosis made from the left pleural effusion 12/06/18.  Also with known brain mets, s/p stereotactic radiosurgery on 12/30/2018.  Since the thoracentesis the patient has had significant reaccumulation of left pleural fluid and was scheduled for OP Pleurx catheter placement today by Dr. Prescott Gum  at [facility].  She presented to the [facility] today for radiation but due to significant dyspnea was sent to the emergency room for further evaluation.  Chest x-ray confirms stable large left pleural effusion.  Following discussions with patient/patient's clinicians decision made for IR to place left Pleurx catheter today.  Details/risks of procedure, including but not limited to, internal bleeding, infection, injury to adjacent structures/pneumothorax discussed with patient with her understanding and consent.   Electronically Signed: D. Rowe Robert, PA-C 01/06/2019, 1:41 PM   I spent a total of 30 minutes at the the patient's bedside AND on the patient's hospital floor or unit, greater than 50% of which was counseling/coordinating care for left Pleurx catheter placement

## 2019-01-06 NOTE — TOC Initial Note (Addendum)
Transition of Care [facility]) - Initial/Assessment Note    Patient Details  Name: Tina Cruz MRN: [id number] Date of Birth: 23-Jan-1961  Transition of [facility] CM/SW Contact:    Erenest Rasher, RN Phone Number: 01/06/2019, 6:06 PM  Clinical Narrative:                 Spoke to pt and explained TOC CM will fax [facility] orders to [facility] and they will arrange [facility]. Placed contact information on dc instructions. Faxed orders for Pleurx drains to [facility]. Provided pt with a copy of paperwork with contact number. Pt will dc with a box of pleurx drain kits (10). Will notify Dr Nils Pyle office that pt may not have [place] RN that will come because her [place] insurance will arrange [facility]. ED RN to educate pt on how to drain pleurx prior to dc.   NCM contacted attending on call, Dr Cyndia Bent, pt will need to contact office on Monday to arrange to come in for instructions on pleurx drain. Made MD aware that pt will not have a soc for [facility] on this weekend due to payor arranges [facility].   Pt oxygen sats at 88% and will need oxygen for home. Will not be able to arrange home oxygen from ED setting. Notified pt's ED attending. Pt will not dc today.  Expected Discharge Plan: [place] Barriers to Discharge: No Barriers Identified   Patient Goals and CMS Choice Patient states their goals for this hospitalization and ongoing recovery are:: breath better CMS Medicare.gov Compare Post Acute Care list provided to:: Patient Choice offered to / list presented to : Patient  Expected Discharge Plan and Services Expected Discharge Plan: [place] In-house Referral: NA Discharge Planning Services: CM Consult Post Acute Care Choice: [place] arrangements for the past 2 months: Single Family Home                 DME Arranged: Other see comment(Pleurx Drain [employer]) DME Agency: Other - Comment([facility]) HH Arranged: RN, PT, OT [place] Agency: Other - See comment  Prior  Living Arrangements/Services Living arrangements for the past 2 months: Single Family Home Lives with:: Spouse Patient language and need for interpreter reviewed:: Yes Do you feel safe going back to the place where you live?: Yes      Need for Family Participation in Patient Care: No (Comment) Care giver support system in place?: Yes (comment)   Criminal Activity/Legal Involvement Pertinent to Current Situation/Hospitalization: No - Comment as needed  Activities of Daily Living      Permission Sought/Granted Permission sought to share information with : Case Manager, PCP Permission granted to share information with : Yes, Verbal Permission Granted  Share Information with NAME: husband Carrina Schoenberger           Emotional Assessment Appearance:: Appears stated age Attitude/Demeanor/Rapport: Engaged Affect (typically observed): Accepting Orientation: : Oriented to Self, Oriented to Place, Oriented to  Time, Oriented to Situation      Admission diagnosis:  hypoxia;lung ca Patient Active Problem List   Diagnosis Date Noted  . Adenocarcinoma of left lung, stage 4 ([place]) 12/14/2018  . Recurrent left pleural effusion 12/14/2018  . Bone metastasis ([place]) 12/14/2018  . Brain metastasis ([place]) 12/14/2018  . Goals of care, counseling/discussion 12/14/2018  . Cancer associated pain 12/14/2018  . Allergic rhinitis 07/27/2018  . Hemangioma 03/11/2017  . Seborrheic keratosis 03/11/2017  . Skin tag 03/11/2017  . GERD (gastroesophageal reflux  disease) 01/14/2015  . Thyroid disease    PCP:  [name], MD Pharmacy:   [id number] - [place], [place] - [address] [place] [address] Phone: [phone] Fax: [phone]     Social Determinants of Health (SDOH) Interventions    Readmission Risk Interventions No flowsheet data found.

## 2019-01-06 NOTE — ED Provider Notes (Signed)
Blood pressure (!) 119/93, pulse (!) 116, temperature 98.1 F (36.7 C), temperature source Oral, resp. rate 20, SpO2 100 %.  Assuming care from Dr. Alvino Chapel.  In short, Tina Cruz is a 58 y.o. female with a chief complaint of Shortness of Breath .  Refer to the original H&P for additional details.  The current plan of care is to f/u on O2 sat after coming back from [place] for chest tube placement.   04:40 PM  Returned from radiology after Pleurx catheter placement with 2 L drawn off the lungs.  Patient is feeling subjectively better but continues to have hypoxemia.  I removed her nonrebreather and patient desatted to 88% at rest with no significant work of breathing.  She does not have COPD.  With active cancer and persistent hypoxemia despite drainage of pleural effusion plan for CT of the chest to rule out PE.  Patient currently on 3 L nasal cannula.  07:10 PM  Called by radiology with evidence of acute PE on CT. Starting Heparin and will admit with patient continuing to require [place] O2.   Discussed patient's case with Hospitalist to request admission. Patient and family (if present) updated with plan. Care transferred to Hospitalist service.  I reviewed all nursing notes, vitals, pertinent old records, EKGs, labs, imaging (as available).   CRITICAL CARE Performed by: Margette Fast Total critical care time: 35 minutes Critical care time was exclusive of separately billable procedures and treating other patients. Critical care was necessary to treat or prevent imminent or life-threatening deterioration. Critical care was time spent personally by me on the following activities: development of treatment plan with patient and/or surrogate as well as nursing, discussions with consultants, evaluation of patient's response to treatment, examination of patient, obtaining history from patient or surrogate, ordering and performing treatments and interventions, ordering and review of laboratory  studies, ordering and review of radiographic studies, pulse oximetry and re-evaluation of patient's condition.  Nanda Quinton, MD Emergency Medicine    Charyl Minervini, [place], MD 01/06/19 [id number]

## 2019-01-06 NOTE — ED Triage Notes (Signed)
Patient presented to the [facility] today for radiation. She has a history of metastatic lung cancer. Instead of using her wheel chair, the patient walked into the [facility] with shortness of breath. She O2 saturations on room air sat at 72%. When placed on 4L O2 nasal cannula they raised to 82%. When placed in 8L non re-breather saturations raised to 90%. The patient had an appointment today for placement of a chest tube at [facility].

## 2019-01-06 NOTE — ED Notes (Signed)
O2 88 P 122 while ambulating

## 2019-01-06 NOTE — ED Provider Notes (Signed)
[facility] DEPT Provider Note   CSN: [id number] Arrival date & time: 01/06/19  1205    History   Chief Complaint Chief Complaint  Patient presents with  . Shortness of Breath    HPI Tina Cruz is a 58 y.o. female.     HPI  Patient presents with shortness of breath.  Has stage IV lung cancer.  Was here for radiation treatment of her brain.  However found to have room air sats of 72%.  Has a history of pleural effusions on the left side due to the cancer and is actually scheduled to get a pleural drain catheter today by Dr. Darcey Nora at [facility].  However she was at [facility] long for the treatment first.  She has been gradually worsening with shortness of breath over the last few days.  States she does not feels if she can lay down right now.  Has had an unchanged cough.  No fevers.  No swelling in her legs.  Past Medical History:  Diagnosis Date  . Active smoker    quit 2016  . Cancer ([place])    left lung stage IV non-cell carcinoma/adenocarcinoma with diagnosis made from left pleural effusion.    . Cancer ([place])    Brain metastasis   . Constipation   . Depression   . Dyspnea   . GERD (gastroesophageal reflux disease)   . H/O chronic bronchitis   . Hypothyroidism   . Seasonal allergies   . SVD (spontaneous vaginal delivery)    x 2  . Thyroid disease    hypothyroid  . Wears partial dentures     Patient Active Problem List   Diagnosis Date Noted  . Adenocarcinoma of left lung, stage 4 ([place]) 12/14/2018  . Recurrent left pleural effusion 12/14/2018  . Bone metastasis ([place]) 12/14/2018  . Brain metastasis ([place]) 12/14/2018  . Goals of care, counseling/discussion 12/14/2018  . Cancer associated pain 12/14/2018  . Allergic rhinitis 07/27/2018  . Hemangioma 03/11/2017  . Seborrheic keratosis 03/11/2017  . Skin tag 03/11/2017  . GERD (gastroesophageal reflux disease) 01/14/2015  . Thyroid disease     Past Surgical History:   Procedure Laterality Date  . COLONOSCOPY     x 2  . TONSILLECTOMY    . TUBAL LIGATION       OB History   No obstetric history on file.      Home Medications    Prior to Admission medications   Medication Sig Start Date End Date Taking? Authorizing Provider  acetaminophen (TYLENOL) 325 MG tablet Take 325 mg by mouth every 6 (six) hours as needed for mild pain or headache.   Yes [provider]  azelastine (ASTELIN) 0.1 % nasal spray Place 1 spray into both nostrils 2 (two) times daily as needed for rhinitis. Use in each nostril as directed 11/02/18  Yes Delsa Grana, PA-C  Cetirizine HCl (ZYRTEC ALLERGY) 10 MG CAPS Take 1 capsule (10 mg total) by mouth daily as needed (allergies). 07/27/18  Yes Dena Billet B, PA-C  dexamethasone (DECADRON) 4 MG tablet Take 1 tablet (4 mg total) by mouth 2 (two) times daily. Patient taking differently: Take 2 mg by mouth daily.  12/14/18  Yes [name], MD  famotidine (PEPCID) 20 MG tablet Take 20 mg by mouth daily as needed for heartburn.    Yes [provider]  ibuprofen (ADVIL) 200 MG tablet Take 200 mg by mouth every 8 (eight) hours as needed for moderate pain.  Yes [provider]  levalbuterol (XOPENEX HFA) 45 MCG/ACT inhaler Inhale 1-2 puffs into the lungs every 4 (four) hours as needed for wheezing or shortness of breath. 09/27/18  Yes Delsa Grana, PA-C  levothyroxine (SYNTHROID, LEVOTHROID) 112 MCG tablet TAKE 1 TABLET (112 MCG TOTAL) BY MOUTH DAILY BEFORE BREAKFAST. 08/03/18  Yes Susy Frizzle, MD  LORazepam (ATIVAN) 1 MG tablet Take 1 tablet about 30 min before wearing mask for brain treatment. Must have a driver after taking this. Take 1 tab PRN before MRIs in the future, if feeling anxious. 12/27/18  Yes Eppie Gibson, MD  magnesium citrate SOLN Take 0.5 Bottles by mouth daily as needed for moderate constipation or severe constipation.   Yes [provider]  oxyCODONE (OXY IR/ROXICODONE) 5 MG  immediate release tablet Take 1 tablet (5 mg total) by mouth 2 (two) times daily. Patient taking differently: Take 0.5 mg by mouth 4 (four) times daily as needed for moderate pain.  01/03/19  Yes Gery Pray, MD  polyethylene glycol (MIRALAX / GLYCOLAX) packet Take 17 g by mouth daily.    Yes [provider]    Family History Family History  Problem Relation Age of Onset  . Cancer Mother        colon  . Alcohol abuse Father     Social History Social History   Tobacco Use  . Smoking status: Former Smoker    Packs/day: 0.50    Years: 5.00    Pack years: 2.50    Types: Cigarettes    Last attempt to quit: 06/03/2015    Years since quitting: 3.5  . Smokeless tobacco: Never Used  Substance Use Topics  . Alcohol use: No    Alcohol/week: 0.0 standard drinks  . Drug use: No     Allergies   Albuterol   Review of Systems Review of Systems  Constitutional: Positive for appetite change.  HENT: Negative for congestion.   Respiratory: Positive for shortness of breath.   Cardiovascular: Negative for chest pain and leg swelling.  Gastrointestinal: Negative for abdominal pain.  Genitourinary: Negative for flank pain.  Musculoskeletal: Negative for back pain.  Skin: Negative for rash.  Psychiatric/Behavioral: Negative for confusion.     Physical Exam Updated Vital Signs BP (!) 137/94   Pulse (!) 124   Temp 98.1 F (36.7 C) (Oral)   Resp (!) 25   LMP  (LMP Unknown)   SpO2 94%   Physical Exam Vitals signs and nursing note reviewed.  HENT:     Head: Atraumatic.  Cardiovascular:     Comments: Tachycardia Pulmonary:     Comments: Tachypnea.  May have decreased breath sounds on left compared to right. Chest:     Chest wall: No tenderness.  Musculoskeletal:     Right lower leg: No edema.     Left lower leg: No edema.  Skin:    General: Skin is warm.     Capillary Refill: Capillary refill takes less than 2 seconds.  Neurological:     General: No focal  deficit present.     Mental Status: She is alert.      ED Treatments / Results  Labs (all labs ordered are listed, but only abnormal results are displayed) Labs Reviewed  COMPREHENSIVE METABOLIC PANEL - Abnormal; Notable for the following components:      Result Value   Glucose, Bld 142 (*)    Total Bilirubin 1.6 (*)    All other components within normal limits  CBC WITH  DIFFERENTIAL/PLATELET - Abnormal; Notable for the following components:   WBC 12.5 (*)    Neutro Abs 11.5 (*)    Lymphs Abs 0.2 (*)    Abs Immature Granulocytes 0.12 (*)    All other components within normal limits  PROTIME-INR    EKG EKG Interpretation  Date/Time:  Friday January 06 2019 12:53:54 EDT Ventricular Rate:  122 PR Interval:    QRS Duration: 85 QT Interval:  309 QTC Calculation: 441 R Axis:   41 Text Interpretation:  Sinus tachycardia Probable left atrial enlargement Borderline low voltage, extremity leads Confirmed by Davonna Belling [phone]) on 01/06/2019 3:04:26 PM   Radiology Dg Chest Portable 1 View  Result Date: 01/06/2019 CLINICAL DATA:  Shortness of breath, pleural effusion EXAM: PORTABLE CHEST 1 VIEW COMPARISON:  01/04/2019 FINDINGS: Large left pleural effusion unchanged with 01/04/2019. No right pleural effusion. No pneumothorax. Stable cardiomediastinal silhouette. No acute osseous abnormality. IMPRESSION: 1. Stable large left pleural effusion. Electronically Signed   By: Kathreen Devoid   On: 01/06/2019 12:49    Procedures Procedures (including critical care time)  Medications Ordered in ED Medications  ceFAZolin (ANCEF) IVPB 2g/100 mL premix (has no administration in time range)     Initial Impression / Assessment and Plan / ED Course  I have reviewed the triage vital signs and the nursing notes.  Pertinent labs & imaging results that were available during my care of the patient were reviewed by me and considered in my medical decision making (see chart for details).         Patient with shortness of breath.  Hypoxia.  Tachycardia.  Has pleural effusion I think is likely cause.  Discussed with Dr. Mohammed Kindle from cardiothoracic surgery.  Recommends keeping patient at [facility] long hospital and have thoracentesis versus Pleurx catheter placed here.  Catheter to be placed by interventional radiology.  Potential discharge after.  Care turned over to Dr. Laverta [place].    CRITICAL CARE Performed by: Davonna Belling Total critical care time: 30 minutes Critical care time was exclusive of separately billable procedures and treating other patients. Critical care was necessary to treat or prevent imminent or life-threatening deterioration. Critical care was time spent personally by me on the following activities: development of treatment plan with patient and/or surrogate as well as nursing, discussions with consultants, evaluation of patient's response to treatment, examination of patient, obtaining history from patient or surrogate, ordering and performing treatments and interventions, ordering and review of laboratory studies, ordering and review of radiographic studies, pulse oximetry and re-evaluation of patient's condition.   Final Clinical Impressions(s) / ED Diagnoses   Final diagnoses:  Pleural effusion    ED Discharge Orders    None       Davonna Belling, MD 01/06/19 1505

## 2019-01-06 NOTE — H&P (Signed)
History and Physical   Tina Cruz VOH:[id number] DOB: [birth year] DOA: 01/06/2019  Referring MD/NP/PA: Dr. Laverta Baltimore  PCP: [name], MD   Outpatient Specialist Dr. Gery Pray, radiation oncology  Patient coming from: Home  Chief Complaint: Shortness of breath  HPI: Tina Cruz is a 59 y.o. female with medical history significant of Stage IV lung cancer with metastasis to the brain who is on radiation therapy, COPD, not on home oxygen, hypothyroidism, depression, GERD, who was sent over due to worsening shortness of breath and new oxygen requirement.  Patient was found to have malignant pleural effusion where she was seen and sent to interventional radiology for thoracentesis and placement of Pleurx catheter.  Patient had 2 L of fluid removed.  Post procedure she continues to be hypoxic with oxygen sats of 88% on room air.  Further evaluation with CT angiogram was done which shows pulmonary embolism.  Patient is being admitted therefore for treatment.  She is still having 6 some shortness of breath but oxygen sat on 2 L is in the 90s.  Denied any significant chest pain.  Denied any nausea vomiting or diarrhea.  No significant fever..  ED Course: Temperature is 98.3, blood pressure 130/99, pulse 130, respiratory 25 and oxygen sat 91% on 2 L.  White count is 12.5.  Glucose 142 otherwise CBC and chemistry appeared to be within normal.  PT 13.2 INR 1.0.  Chest x-ray showed no active findings.  It shows a left pleural effusion is decreased.  CT angiogram of the chest shows acute multilobar right pulmonary emboli to the right upper and lower lobes with right heart strain.  There was new patchy airspace opacity within the left upper lobe lingula left lower lobe with pneumonia not excluded.  Also left upper lobe mass with postobstructive consolidation.  Multiple bone metastasis to the spine left scapula and ribs.  Patient initiated on treatment and being admitted.  Review of Systems: As  per HPI otherwise 10 point review of systems negative.    Past Medical History:  Diagnosis Date   Active smoker    quit 2016   Cancer [facility])    left lung stage IV non-cell carcinoma/adenocarcinoma with diagnosis made from left pleural effusion.     Cancer ([place])    Brain metastasis    Constipation    Depression    Dyspnea    GERD (gastroesophageal reflux disease)    H/O chronic bronchitis    Hypothyroidism    Seasonal allergies    SVD (spontaneous vaginal delivery)    x 2   Thyroid disease    hypothyroid   Wears partial dentures     Past Surgical History:  Procedure Laterality Date   COLONOSCOPY     x 2   IR PERC PLEURAL DRAIN W/INDWELL CATH W/IMG GUIDE  01/06/2019   TONSILLECTOMY     TUBAL LIGATION       reports that she quit smoking about 3 years ago. Her smoking use included cigarettes. She has a 2.50 pack-year smoking history. She has never used smokeless tobacco. She reports that she does not drink alcohol or use drugs.  Allergies  Allergen Reactions   Albuterol Hives and Itching    Family History  Problem Relation Age of Onset   Cancer Mother        colon   Alcohol abuse Father      Prior to Admission medications   Medication Sig Start Date End Date Taking? Authorizing Provider  acetaminophen (TYLENOL)  325 MG tablet Take 325 mg by mouth every 6 (six) hours as needed for mild pain or headache.   Yes [provider]  azelastine (ASTELIN) 0.1 % nasal spray Place 1 spray into both nostrils 2 (two) times daily as needed for rhinitis. Use in each nostril as directed 11/02/18  Yes Delsa Grana, PA-C  Cetirizine HCl (ZYRTEC ALLERGY) 10 MG CAPS Take 1 capsule (10 mg total) by mouth daily as needed (allergies). 07/27/18  Yes Dena Billet B, PA-C  dexamethasone (DECADRON) 4 MG tablet Take 1 tablet (4 mg total) by mouth 2 (two) times daily. Patient taking differently: Take 2 mg by mouth daily.  12/14/18  Yes [name], MD  famotidine  (PEPCID) 20 MG tablet Take 20 mg by mouth daily as needed for heartburn.    Yes [provider]  ibuprofen (ADVIL) 200 MG tablet Take 200 mg by mouth every 8 (eight) hours as needed for moderate pain.    Yes [provider]  levalbuterol (XOPENEX HFA) 45 MCG/ACT inhaler Inhale 1-2 puffs into the lungs every 4 (four) hours as needed for wheezing or shortness of breath. 09/27/18  Yes Delsa Grana, PA-C  levothyroxine (SYNTHROID, LEVOTHROID) 112 MCG tablet TAKE 1 TABLET (112 MCG TOTAL) BY MOUTH DAILY BEFORE BREAKFAST. 08/03/18  Yes Susy Frizzle, MD  LORazepam (ATIVAN) 1 MG tablet Take 1 tablet about 30 min before wearing mask for brain treatment. Must have a driver after taking this. Take 1 tab PRN before MRIs in the future, if feeling anxious. 12/27/18  Yes Eppie Gibson, MD  magnesium citrate SOLN Take 0.5 Bottles by mouth daily as needed for moderate constipation or severe constipation.   Yes [provider]  oxyCODONE (OXY IR/ROXICODONE) 5 MG immediate release tablet Take 1 tablet (5 mg total) by mouth 2 (two) times daily. Patient taking differently: Take 0.5 mg by mouth 4 (four) times daily as needed for moderate pain.  01/03/19  Yes Gery Pray, MD  polyethylene glycol (MIRALAX / GLYCOLAX) packet Take 17 g by mouth daily.    Yes [provider]    Physical Exam: Vitals:   01/06/19 1743 01/06/19 1815 01/06/19 1845 01/06/19 1900  BP: 133/89 127/90 118/81 131/79  Pulse: (!) 106 (!) 112 (!) 127 (!) 130  Resp: 19 (!) 23 15   Temp:      TempSrc:      SpO2: 94% 93% 96% 91%      Constitutional: NAD, calm, in tears, depressed and agitation Vitals:   01/06/19 1743 01/06/19 1815 01/06/19 1845 01/06/19 1900  BP: 133/89 127/90 118/81 131/79  Pulse: (!) 106 (!) 112 (!) 127 (!) 130  Resp: 19 (!) 23 15   Temp:      TempSrc:      SpO2: 94% 93% 96% 91%   Eyes: PERRL, lids and conjunctivae normal ENMT: Mucous membranes are moist. Posterior pharynx clear of  any exudate or lesions.Normal dentition.  Neck: normal, supple, no masses, no thyromegaly Respiratory: Decreased air entry bilaterally left more than right, coarse breath sounds, positive egophony increased respiratory effort. No accessory muscle use.  Cardiovascular: Regular rate and rhythm, no murmurs / rubs / gallops. No extremity edema. 2+ pedal pulses. No carotid bruits.  Abdomen: no tenderness, no masses palpated. No hepatosplenomegaly. Bowel sounds positive.  Musculoskeletal: no clubbing / cyanosis. No joint deformity upper and lower extremities. Good ROM, no contractures. Normal muscle tone.  Skin: no rashes, lesions, ulcers. No induration Neurologic: CN 2-12 grossly intact. Sensation intact,  DTR normal. Strength 5/5 in all 4.  Psychiatric: Normal judgment and insight. Alert and oriented x 3. Normal mood.     Labs on Admission: I have personally reviewed following labs and imaging studies  CBC: Recent Labs  Lab 01/06/19 1253  WBC 12.5*  NEUTROABS 11.5*  HGB 14.1  HCT 41.8  MCV 92.7  PLT 751   Basic Metabolic Panel: Recent Labs  Lab 01/06/19 1253  NA 139  K 3.7  CL 103  CO2 25  GLUCOSE 142*  BUN 12  CREATININE 0.64  CALCIUM 9.3   GFR: Estimated Creatinine Clearance: 83.9 mL/min (by C-G formula based on SCr of 0.64 mg/dL). Liver Function Tests: Recent Labs  Lab 01/06/19 1253  AST 27  ALT 38  ALKPHOS 81  BILITOT 1.6*  PROT 7.1  ALBUMIN 4.0   No results for input(s): LIPASE, AMYLASE in the last 168 hours. No results for input(s): AMMONIA in the last 168 hours. Coagulation Profile: Recent Labs  Lab 01/06/19 1253  INR 1.0   Cardiac Enzymes: No results for input(s): CKTOTAL, CKMB, CKMBINDEX, TROPONINI in the last 168 hours. BNP (last 3 results) No results for input(s): PROBNP in the last 8760 hours. HbA1C: No results for input(s): HGBA1C in the last 72 hours. CBG: No results for input(s): GLUCAP in the last 168 hours. Lipid Profile: No results for  input(s): CHOL, HDL, LDLCALC, TRIG, CHOLHDL, LDLDIRECT in the last 72 hours. Thyroid Function Tests: No results for input(s): TSH, T4TOTAL, FREET4, T3FREE, THYROIDAB in the last 72 hours. Anemia Panel: No results for input(s): VITAMINB12, FOLATE, FERRITIN, TIBC, IRON, RETICCTPCT in the last 72 hours. Urine analysis:    Component Value Date/Time   COLORURINE YELLOW 05/24/2017 1450   APPEARANCEUR CLEAR 05/24/2017 1450   LABSPEC 1.010 05/24/2017 1450   PHURINE 7.0 05/24/2017 1450   GLUCOSEU NEGATIVE 05/24/2017 1450   HGBUR NEGATIVE 05/24/2017 1450   BILIRUBINUR NEGATIVE 05/24/2017 1450   KETONESUR NEGATIVE 05/24/2017 1450   PROTEINUR NEGATIVE 05/24/2017 1450   NITRITE NEGATIVE 05/24/2017 1450   LEUKOCYTESUR NEGATIVE 05/24/2017 1450   Sepsis Labs: @LABRCNTIP (procalcitonin:4,lacticidven:4) )No results found for this or any previous visit (from the past 240 hour(s)).   Radiological Exams on Admission: Ct Angio Chest Pe W And/or Wo Contrast  Result Date: 01/06/2019 CLINICAL DATA:  Dyspnea with stage IV lung cancer.  Hypoxia. EXAM: CT ANGIOGRAPHY CHEST WITH CONTRAST TECHNIQUE: Multidetector CT imaging of the chest was performed using the standard protocol during bolus administration of intravenous contrast. Multiplanar CT image reconstructions and MIPs were obtained to evaluate the vascular anatomy. CONTRAST:  124mL OMNIPAQUE IOHEXOL 350 MG/ML SOLN COMPARISON:  Head CT 12/12/2018 and chest CT 11/11/2018 FINDINGS: Cardiovascular: Conventional branch pattern of the great vessels. Nonaneurysmal thoracic aorta. Acute pulmonary emboli noted on the right starting from the bifurcation of the right main pulmonary artery extending into the right upper and lower lobe lobar and segmental branches. Right heart strain is identified with RV/LV ratio 1.2. Mediastinum/Nodes: Small subcentimeter mediastinal lymph nodes with previously described metabolically active left-sided supraclavicular lymph nodes, the  largest approximately 9 mm. No thyromegaly or mass. Patent trachea and mainstem bronchi with slight luminal narrowing of the distal left upper lobe bronchi from known mass and consolidation. Lungs/Pleura: Redemonstration of masslike opacity and postobstructive consolidation of the medial left upper lobe. The margins of the mass are difficult to identified due to the adjacent abutting pulmonary consolidations likely representing postobstructive change. The degree of postobstructive consolidation appears slightly increased since prior. Moderate to large  left pleural effusion with adjacent left basilar compressive atelectasis with atelectasis along the left major fissure is identified. The volume of fluid in the left hemithorax has increased slightly from the November 11, 2018 exam though is decreased in appearance relative to the more recent prior PET-CT from 12/12/2018. The tip of a Pleurx catheter terminates at the posterior fourth rib level. Patchy airspace disease in the left upper lobe, lingula and left lower lobe can not exclude superimposed pneumonia. Upper Abdomen: No acute abnormality. Musculoskeletal: Redemonstration of scattered osseous metastasis, at the base of the left glenoid, midthoracic spine and ribs better visualized on prior PET-CT. Review of the MIP images confirms the above findings. IMPRESSION: 1. Acute multilobar right-sided pulmonary emboli to the right upper and lower lobes with right heart strain, RV/LV ratio 1.2. 2. New patchy airspace opacities within the aerated left upper lobe, lingula and left lower lobe. Pneumonia is not excluded. 3. Redemonstration of left upper lobe mass with postobstructive consolidation and moderate to large left pleural effusion. PleurX catheter is noted within effusion. 4. Osseous metastatic disease to the dorsal spine, left scapula and ribs. These results were called by telephone at the time of interpretation on 01/06/2019 at 7:11 pm to Dr. Nanda Quinton , who  verbally acknowledged these results. Electronically Signed   By: Ashley Royalty M.D.   On: 01/06/2019 19:14   Dg Chest Port 1 View  Result Date: 01/06/2019 CLINICAL DATA:  Status post left PleurX catheter placement for malignant pleural effusion secondary to adenocarcinoma of the lung. EXAM: PORTABLE CHEST 1 VIEW COMPARISON:  Film earlier today at 1228 hours FINDINGS: Left basilar PleurX catheter present. After fluid removal, there is reduction in amount of left pleural fluid volume with improved aeration of the left lung. Small amount of pleural fluid remains. No pneumothorax. Right lung remains clear. IMPRESSION: Decrease in left pleural fluid volume after placement of PleurX catheter and drainage of pleural fluid. No pneumothorax. Electronically Signed   By: Aletta Edouard M.D.   On: 01/06/2019 16:25   Dg Chest Portable 1 View  Result Date: 01/06/2019 CLINICAL DATA:  Shortness of breath, pleural effusion EXAM: PORTABLE CHEST 1 VIEW COMPARISON:  01/04/2019 FINDINGS: Large left pleural effusion unchanged with 01/04/2019. No right pleural effusion. No pneumothorax. Stable cardiomediastinal silhouette. No acute osseous abnormality. IMPRESSION: 1. Stable large left pleural effusion. Electronically Signed   By: Kathreen Devoid   On: 01/06/2019 12:49   Ir Perc Pleural Drain Genia Harold Cath W/img Guide  Result Date: 01/06/2019 INDICATION: 58 year old with lung cancer and malignant left pleural effusion. Patient was scheduled for a tunneled pleural catheter by Thoracic Surgery today at [facility]. Patient was at [facility] for radiation therapy but became short of breath and ended up in the Emergency Department. Interventional Radiology was consulted for placement of the tunneled pleural catheter. EXAM: PLACEMENT OF TUNNELED LEFT PLEURAL CATHETER WITH ULTRASOUND AND FLUOROSCOPIC GUIDANCE MEDICATIONS: Ancef 2 g ANESTHESIA/SEDATION: Fentanyl 50 mcg IV; Versed 1 mg IV Moderate Sedation Time:  24  minutes The patient was continuously monitored during the procedure by the interventional radiology nurse under my direct supervision. FLUOROSCOPY TIME:  30 seconds, 15 mGy COMPLICATIONS: None immediate. PROCEDURE: Informed written consent was obtained from the patient after a thorough discussion of the procedural risks, benefits and alternatives. All questions were addressed. Maximal Sterile Barrier Technique was utilized including caps, mask, sterile gowns, sterile gloves, sterile drape, hand hygiene and skin antiseptic. A timeout was performed prior to the initiation of the  procedure. Ultrasound was used to identify a large amount of left pleural fluid. The left mid axillary line and left anterior lower chest area was prepped and draped in sterile fashion. Skin was anesthetized and 2 small incisions were made. 15.5 [place] PleurX catheter was tunneled between the incisions and the cuff was placed underneath the skin. A Yueh catheter was directed into the pleural space along the more lateral incision with ultrasound guidance. Yellow fluid was aspirated. Wire was advanced into the pleural space and a peel-away sheath was placed. Catheter was directed through the peel-away sheath into the pleural space. 2 L of yellow pleural fluid was removed. Pleural entrance skin site was closed using absorbable suture and Dermabond. Catheter was sutured to skin with Prolene suture. Fluoroscopic and ultrasound images were taken and saved for documentation. IMPRESSION: Successful placement of a tunneled left pleural catheter with ultrasound and fluoroscopic guidance. Electronically Signed   By: Markus Daft M.D.   On: 01/06/2019 16:07    EKG: Independently reviewed.  Sinus tach with a rate of 122, no significant ST changes.  Assessment/Plan Principal Problem:   Acute pulmonary embolism (HCC) Active Problems:   Thyroid disease   GERD (gastroesophageal reflux disease)   Adenocarcinoma of left lung, stage 4 (HCC)   Recurrent  left pleural effusion   Bone metastasis (HCC)     #1 right pulmonary embolus: Patient has right heart strain with multiple PEs.  Risk factors most likely her malignancy.  She will be admitted and initiated on heparin drip.  Echocardiogram will be ordered.  Once stable patient will be transitioned to oral anticoagulants.  #2 metastatic adenocarcinoma: Patient undergoing radiation therapy at the moment.  Palliation mainly.  Continue per oncology.  #3 left-sided pleural effusion: Status post Pleurx catheter insertion.  Continue monitoring.  She is seen by thoracic surgery.  #4 GERD: Continue with PPIs.  #5 hypothyroidism: Continue levothyroxine.   DVT prophylaxis: Heparin Code Status: Full code Family Communication: No family at bedside Disposition Plan: To be determined Consults called: Oncology Admission status: Inpatient  Severity of Illness: The appropriate patient status for this patient is INPATIENT. Inpatient status is judged to be reasonable and necessary in order to provide the required intensity of service to ensure the patient's safety. The patient's presenting symptoms, physical exam findings, and initial radiographic and laboratory data in the context of their chronic comorbidities is felt to place them at high risk for further clinical deterioration. Furthermore, it is not anticipated that the patient will be medically stable for discharge from the hospital within 2 midnights of admission. The following factors support the patient status of inpatient.   " The patient's presenting symptoms include shortness of breath. " The worrisome physical exam findings include decreased air entry bilaterally. " The initial radiographic and laboratory data are worrisome because of hypoxemia with evidence of PE. " The chronic co-morbidities include stage IV metastatic lung cancer.   * I certify that at the point of admission it is my clinical judgment that the patient will require  inpatient hospital care spanning beyond 2 midnights from the point of admission due to high intensity of service, high risk for further deterioration and high frequency of surveillance required.Barbette Merino MD [facility] Pager [phone]  If 7PM-7AM, please contact night-coverage [email or web address] Password [facility]  01/06/2019, 7:36 PM

## 2019-01-06 NOTE — Progress Notes (Signed)
Consult request for [facility] teaching/management of drain has been received.  Per EPD, a RN CM consult was placed with an order for RN/Aide/PT/OT/Social Work.   CSW attempting to follow up at present time and consult RN CM for guidance and assistance with HHN needs.  Marylou Flesher, LCSW, [place] Social Worker [phone]

## 2019-01-06 NOTE — ED Notes (Signed)
Bed: WA08 Expected date:  Expected time:  Means of arrival:  Comments: [facility] pt

## 2019-01-06 NOTE — Progress Notes (Signed)
ANTICOAGULATION CONSULT NOTE - Initial Consult  Pharmacy Consult for heparin Indication: pulmonary embolus  Allergies  Allergen Reactions  . Albuterol Hives and Itching    Patient Measurements:   Heparin Dosing Weight: 77.6  Vital Signs: Temp: 98.1 F (36.7 C) (03/27 1257) Temp Source: Oral (03/27 1257) BP: 131/79 (03/27 1900) Pulse Rate: 130 (03/27 1900)  Labs: Recent Labs    01/06/19 1253  HGB 14.1  HCT 41.8  PLT 215  LABPROT 13.2  INR 1.0  CREATININE 0.64    Estimated Creatinine Clearance: 83.9 mL/min (by C-G formula based on SCr of 0.64 mg/dL).   Medical History: Past Medical History:  Diagnosis Date  . Active smoker    quit 2016  . Cancer ([place])    left lung stage IV non-cell carcinoma/adenocarcinoma with diagnosis made from left pleural effusion.    . Cancer ([place])    Brain metastasis   . Constipation   . Depression   . Dyspnea   . GERD (gastroesophageal reflux disease)   . H/O chronic bronchitis   . Hypothyroidism   . Seasonal allergies   . SVD (spontaneous vaginal delivery)    x 2  . Thyroid disease    hypothyroid  . Wears partial dentures     Assessment: 58 yo F with stage IV adenocarcinoma of the lung with brain and bone metastasis: The patient is followed by Dr. Julien Nordmann and is currently receiving radiation therapy.  IR placed a pleurex catheter in her L pleural space today.  Pharmacy consulted to dose heparin for acute PE on CT 3/27.  CBC WNL, SCr WNL. No anticoagulants PTA.   Goal of Therapy:  Heparin level 0.3-0.7 units/ml Monitor platelets by anticoagulation protocol: Yes   Plan:  Give 2000 units bolus x 1 Start heparin infusion at 1250 units/hr Check anti-Xa level in 6 hours and daily while on heparin Continue to monitor H&H and platelets  Eudelia Bunch, Pharm.D [phone] 01/06/2019 7:16 PM

## 2019-01-06 NOTE — Progress Notes (Signed)
ED TO INPATIENT HANDOFF REPORT  Name/Age/Gender Tina Cruz 58 y.o. female  Code Status   Home/SNF/Other Home  Chief Complaint hypoxia;lung ca  Level of Care/Admitting Diagnosis ED Disposition    ED Disposition Condition [facility]: [place] [[id number]]  Level of Care: Telemetry [5]  Admit to tele based on following criteria: Other see comments  Comments: Hypoxia  Diagnosis: Acute pulmonary embolism ([place]) [767341]  Admitting Physician: Elwyn Reach [[id number]]  Attending Physician: Elwyn Reach [[id number]]  Estimated length of stay: past midnight tomorrow  Certification:: I certify this patient will need inpatient services for at least 2 midnights  PT Class (Do Not Modify): Inpatient [101]  PT Acc Code (Do Not Modify): Private [1]       Medical History Past Medical History:  Diagnosis Date  . Active smoker    quit 2016  . Cancer ([place])    left lung stage IV non-cell carcinoma/adenocarcinoma with diagnosis made from left pleural effusion.    . Cancer ([place])    Brain metastasis   . Constipation   . Depression   . Dyspnea   . GERD (gastroesophageal reflux disease)   . H/O chronic bronchitis   . Hypothyroidism   . Seasonal allergies   . SVD (spontaneous vaginal delivery)    x 2  . Thyroid disease    hypothyroid  . Wears partial dentures     Allergies Allergies  Allergen Reactions  . Albuterol Hives and Itching    IV Location/Drains/Wounds Patient Lines/Drains/Airways Status   Active Line/Drains/Airways    Name:   Placement date:   Placement time:   Site:   Days:   Peripheral IV 01/06/19 Left Antecubital   01/06/19    1656    Antecubital   less than 1   Closed System Drain 1 Left;Lateral Abdomen Other (Comment) 15.5 Fr.   01/06/19    -    Abdomen   less than 1          Labs/Imaging Results for orders placed or performed during the hospital encounter of 01/06/19 (from the past 48 hour(s))  Comprehensive  metabolic panel     Status: Abnormal   Collection Time: 01/06/19 12:53 PM  Result Value Ref Range   Sodium 139 135 - 145 mmol/L   Potassium 3.7 3.5 - 5.1 mmol/L   Chloride 103 98 - 111 mmol/L   CO2 25 22 - 32 mmol/L   Glucose, Bld 142 (H) 70 - 99 mg/dL   BUN 12 6 - 20 mg/dL   Creatinine, Ser 0.64 0.44 - 1.00 mg/dL   Calcium 9.3 8.9 - 10.3 mg/dL   Total Protein 7.1 6.5 - 8.1 g/dL   Albumin 4.0 3.5 - 5.0 g/dL   AST 27 15 - 41 U/L   ALT 38 0 - 44 U/L   Alkaline Phosphatase 81 38 - 126 U/L   Total Bilirubin 1.6 (H) 0.3 - 1.2 mg/dL   GFR calc non Af Amer >60 >60 mL/min   GFR calc Af Amer >60 >60 mL/min   Anion gap 11 5 - 15    Comment: Performed at [facility], [place] [address], [place], [place] [address]  CBC with Differential     Status: Abnormal   Collection Time: 01/06/19 12:53 PM  Result Value Ref Range   WBC 12.5 (H) 4.0 - 10.5 K/uL   RBC 4.51 3.87 - 5.11 MIL/uL   Hemoglobin 14.1 12.0 - 15.0 g/dL  HCT 41.8 36.0 - 46.0 %   MCV 92.7 80.0 - 100.0 fL   MCH 31.3 26.0 - 34.0 pg   MCHC 33.7 30.0 - 36.0 g/dL   RDW 13.3 11.5 - 15.5 %   Platelets 215 150 - 400 K/uL   nRBC 0.0 0.0 - 0.2 %   Neutrophils Relative % 93 %   Neutro Abs 11.5 (H) 1.7 - 7.7 K/uL   Lymphocytes Relative 1 %   Lymphs Abs 0.2 (L) 0.7 - 4.0 K/uL   Monocytes Relative 5 %   Monocytes Absolute 0.6 0.1 - 1.0 K/uL   Eosinophils Relative 0 %   Eosinophils Absolute 0.0 0.0 - 0.5 K/uL   Basophils Relative 0 %   Basophils Absolute 0.0 0.0 - 0.1 K/uL   Immature Granulocytes 1 %   Abs Immature Granulocytes 0.12 (H) 0.00 - 0.07 K/uL    Comment: Performed at [facility], [place] [address], [place], [place] [address]  Protime-INR     Status: None   Collection Time: 01/06/19 12:53 PM  Result Value Ref Range   Prothrombin Time 13.2 11.4 - 15.2 seconds   INR 1.0 0.8 - 1.2    Comment: (NOTE) INR goal varies based on device and disease states. Performed at [facility], [place] [address] [place], [place] [address]    Ct Angio Chest Pe W And/or Wo Contrast  Result Date: 01/06/2019 CLINICAL DATA:  Dyspnea with stage IV lung cancer.  Hypoxia. EXAM: CT ANGIOGRAPHY CHEST WITH CONTRAST TECHNIQUE: Multidetector CT imaging of the chest was performed using the standard protocol during bolus administration of intravenous contrast. Multiplanar CT image reconstructions and MIPs were obtained to evaluate the vascular anatomy. CONTRAST:  151mL OMNIPAQUE IOHEXOL 350 MG/ML SOLN COMPARISON:  Head CT 12/12/2018 and chest CT 11/11/2018 FINDINGS: Cardiovascular: Conventional branch pattern of the great vessels. Nonaneurysmal thoracic aorta. Acute pulmonary emboli noted on the right starting from the bifurcation of the right main pulmonary artery extending into the right upper and lower lobe lobar and segmental branches. Right heart strain is identified with RV/LV ratio 1.2. Mediastinum/Nodes: Small subcentimeter mediastinal lymph nodes with previously described metabolically active left-sided supraclavicular lymph nodes, the largest approximately 9 mm. No thyromegaly or mass. Patent trachea and mainstem bronchi with slight luminal narrowing of the distal left upper lobe bronchi from known mass and consolidation. Lungs/Pleura: Redemonstration of masslike opacity and postobstructive consolidation of the medial left upper lobe. The margins of the mass are difficult to identified due to the adjacent abutting pulmonary consolidations likely representing postobstructive change. The degree of postobstructive consolidation appears slightly increased since prior. Moderate to large left pleural effusion with adjacent left basilar compressive atelectasis with atelectasis along the left major fissure is identified. The volume of fluid in the left hemithorax has increased slightly from the November 11, 2018 exam though is decreased in appearance relative to the more recent prior PET-CT from  12/12/2018. The tip of a Pleurx catheter terminates at the posterior fourth rib level. Patchy airspace disease in the left upper lobe, lingula and left lower lobe can not exclude superimposed pneumonia. Upper Abdomen: No acute abnormality. Musculoskeletal: Redemonstration of scattered osseous metastasis, at the base of the left glenoid, midthoracic spine and ribs better visualized on prior PET-CT. Review of the MIP images confirms the above findings. IMPRESSION: 1. Acute multilobar right-sided pulmonary emboli to the right upper and lower lobes with right heart strain, RV/LV ratio 1.2. 2. New patchy airspace opacities within the aerated left  upper lobe, lingula and left lower lobe. Pneumonia is not excluded. 3. Redemonstration of left upper lobe mass with postobstructive consolidation and moderate to large left pleural effusion. PleurX catheter is noted within effusion. 4. Osseous metastatic disease to the dorsal spine, left scapula and ribs. These results were called by telephone at the time of interpretation on 01/06/2019 at 7:11 pm to Dr. Nanda Quinton , who verbally acknowledged these results. Electronically Signed   By: Ashley Royalty M.D.   On: 01/06/2019 19:14   Dg Chest Port 1 View  Result Date: 01/06/2019 CLINICAL DATA:  Status post left PleurX catheter placement for malignant pleural effusion secondary to adenocarcinoma of the lung. EXAM: PORTABLE CHEST 1 VIEW COMPARISON:  Film earlier today at 1228 hours FINDINGS: Left basilar PleurX catheter present. After fluid removal, there is reduction in amount of left pleural fluid volume with improved aeration of the left lung. Small amount of pleural fluid remains. No pneumothorax. Right lung remains clear. IMPRESSION: Decrease in left pleural fluid volume after placement of PleurX catheter and drainage of pleural fluid. No pneumothorax. Electronically Signed   By: Aletta Edouard M.D.   On: 01/06/2019 16:25   Dg Chest Portable 1 View  Result Date:  01/06/2019 CLINICAL DATA:  Shortness of breath, pleural effusion EXAM: PORTABLE CHEST 1 VIEW COMPARISON:  01/04/2019 FINDINGS: Large left pleural effusion unchanged with 01/04/2019. No right pleural effusion. No pneumothorax. Stable cardiomediastinal silhouette. No acute osseous abnormality. IMPRESSION: 1. Stable large left pleural effusion. Electronically Signed   By: Kathreen Devoid   On: 01/06/2019 12:49   Ir Perc Pleural Drain Genia Harold Cath W/img Guide  Result Date: 01/06/2019 INDICATION: 58 year old with lung cancer and malignant left pleural effusion. Patient was scheduled for a tunneled pleural catheter by Thoracic Surgery today at [facility]. Patient was at [facility] for radiation therapy but became short of breath and ended up in the Emergency Department. Interventional Radiology was consulted for placement of the tunneled pleural catheter. EXAM: PLACEMENT OF TUNNELED LEFT PLEURAL CATHETER WITH ULTRASOUND AND FLUOROSCOPIC GUIDANCE MEDICATIONS: Ancef 2 g ANESTHESIA/SEDATION: Fentanyl 50 mcg IV; Versed 1 mg IV Moderate Sedation Time:  24 minutes The patient was continuously monitored during the procedure by the interventional radiology nurse under my direct supervision. FLUOROSCOPY TIME:  30 seconds, 15 mGy COMPLICATIONS: None immediate. PROCEDURE: Informed written consent was obtained from the patient after a thorough discussion of the procedural risks, benefits and alternatives. All questions were addressed. Maximal Sterile Barrier Technique was utilized including caps, mask, sterile gowns, sterile gloves, sterile drape, hand hygiene and skin antiseptic. A timeout was performed prior to the initiation of the procedure. Ultrasound was used to identify a large amount of left pleural fluid. The left mid axillary line and left anterior lower chest area was prepped and draped in sterile fashion. Skin was anesthetized and 2 small incisions were made. 15.5 [place] PleurX catheter was tunneled  between the incisions and the cuff was placed underneath the skin. A Yueh catheter was directed into the pleural space along the more lateral incision with ultrasound guidance. Yellow fluid was aspirated. Wire was advanced into the pleural space and a peel-away sheath was placed. Catheter was directed through the peel-away sheath into the pleural space. 2 L of yellow pleural fluid was removed. Pleural entrance skin site was closed using absorbable suture and Dermabond. Catheter was sutured to skin with Prolene suture. Fluoroscopic and ultrasound images were taken and saved for documentation. IMPRESSION: Successful placement of a tunneled left  pleural catheter with ultrasound and fluoroscopic guidance. Electronically Signed   By: Markus Daft M.D.   On: 01/06/2019 16:07    Pending Labs Unresulted Labs (From admission, onward)    Start     Ordered   01/08/19 0500  CBC  Daily,   R     01/06/19 1942   01/07/19 0200  Heparin level (unfractionated)  Once-Timed,   R     01/06/19 1942   01/07/19 0200  CBC  Once-Timed,   R     01/06/19 1942   Signed and Held  HIV antibody (Routine Testing)  Once,   R     Signed and Held   Signed and Held  Comprehensive metabolic panel  Tomorrow morning,   R     Signed and Held   Signed and Held  CBC  Tomorrow morning,   R     Signed and Held          Vitals/Pain Today's Vitals   01/06/19 1930 01/06/19 2000 01/06/19 2030 01/06/19 2100  BP: 124/75 119/86 124/84 121/88  Pulse: (!) 126 (!) 125 (!) 114 (!) 115  Resp:      Temp:      TempSrc:      SpO2: 93% 94% 94% 94%  PainSc:        Isolation Precautions No active isolations  Medications Medications  midazolam (VERSED) 2 MG/2ML injection (has no administration in time range)  fentaNYL (SUBLIMAZE) 100 MCG/2ML injection (has no administration in time range)  HYDROcodone-acetaminophen (NORCO/VICODIN) 5-325 MG per tablet 1-2 tablet (has no administration in time range)  heparin ADULT infusion 100 units/mL  (25000 units/244mL sodium chloride 0.45%) (1,250 Units/hr Intravenous New Bag/Given 01/06/19 1926)  ceFAZolin (ANCEF) IVPB 2g/100 mL premix (0 g Intravenous Stopped 01/06/19 1544)  midazolam (VERSED) injection (0.5 mg Intravenous Given 01/06/19 1522)  fentaNYL (SUBLIMAZE) injection (25 mcg Intravenous Given 01/06/19 1527)  lidocaine (XYLOCAINE) 1 % (with pres) injection (10 mLs  Given 01/06/19 1520)  iohexol (OMNIPAQUE) 350 MG/ML injection 100 mL (100 mLs Intravenous Contrast Given 01/06/19 1718)  heparin bolus via infusion 2,000 Units (2,000 Units Intravenous Bolus from Bag 01/06/19 1934)    Mobility walks with device

## 2019-01-06 NOTE — Discharge Instructions (Addendum)
Indwelling Pleural Catheter Home Guide  An indwelling pleural catheter is a thin, flexible tube that is inserted under your skin and into your chest. The catheter drains excess fluid that collects in the area between the chest wall and the lungs (pleural space).After the catheter is inserted, it can be attached to a bottle that collects fluid. The pleural catheter will allow you to drain fluid from your chest at home on a regular basis (sometimes daily). This will eliminate the need for frequent visits to the hospital or clinic to drain the fluid. The catheter may be removed after the excess fluid problem is resolved, usually after 2-3 months. It is important to follow instructions from your health care provider about how to drain and care for your catheter. What are the risks? Generally, this is a safe procedure. However, problems may occur, including:  Infection.  Skin damage around the catheter.  Lung damage.  Failure of the chest tube to work properly.  Spreading of cancer cells along the catheter, if you have cancer. Supplies needed:  Vacuum-sealed drainage bottle with attached drainage line.  Sterile dressing.  Sterile alcohol pads.  Sterile gloves.  Valve cap.  Sterile gauze pads, 4  4 inch (10 cm  10 cm).  Tape.  Adhesive dressing.  Sterile foam catheter pad. How to care for your catheter and insertion site  Wash your hands with soap and warm water before and after touching the catheter or insertion site. If soap and water are not available, use hand sanitizer.  Check your bandage (dressing) daily to make sure it is clean and dry.  Keep the skin around the catheter clean and dry.  Check the catheter regularly for any cracks or kinks in the tubing.  Check your catheter insertion site every day for signs of infection. Check for: ? Skin breakdown. ? Redness, swelling, or pain. ? Fluid or blood. ? Warmth. ? Pus or a bad smell. How to drain your catheter You  may need to drain your catheter every day, or more or less often as told by your health care provider. Follow instructions from your health care provider about how to drain your catheter. You may also refer to instructions that come with the drainage system. To drain the catheter: 1. Wash your hands with soap and warm water. If soap and water are not available, use hand sanitizer. 2. Carefully remove the dressing from around the catheter. 3. Wash your hands again. 4. Put on the gloves provided. 5. Prepare the vacuum-sealed drainage bottle and drainage line. Close the drainage line of the vacuum-sealed drainage bottle by squeezing the pinch clamp or rolling the wheel of the roller clamp toward the bottle. The vacuum in the bottle will be lost if the line is not closed completely. 6. Remove the access tip cover from the drainage line. Do not touch the end. Set it on a sterile surface. 7. Remove the catheter valve cap and throw it away. 8. Use an alcohol pad to clean the end of the catheter. 9. Insert the access tip into the catheter valve. Make sure the valve and access tip are securely connected. Listen for a click to confirm that they are connected. 10. Insert the T plunger to break the vacuum seal on the drainage bottle. 11. Open the clamp on the drainage line. 12. Allow the catheter to drain. Keep the catheter and the drainage bottle below the level of your chest. There may be a one-way valve on the end of the  tubing that will allow liquid and air to flow out of the catheter without letting air inside. 13. Drain the amount of fluid as told by your health care provider. It usually takes 5-15 minutes. Do not drain more than 1000 mL of fluid. You may feel a little discomfort while you are draining. If the pain is severe, stop draining and contact your health care provider. 14. After you finish draining the catheter, remove the drainage bottle tubing from the catheter. 15. Use a clean alcohol pad to  wipe the catheter tip. 16. Place a clean cap on the end of the catheter. 17. Use an alcohol pad to clean the skin around the catheter. 18. Allow the skin to air-dry. 19. Put the catheter pad on your skin. Curl the catheter into loops and place it on the pad. Do not place the catheter on your skin. 20. Replace the dressing over the catheter. 21. Discard the drainage bottle as instructed by your health care provider. Do not reuse the drainage bottle. How to change your dressing Change your dressing at least once a week, or more often if needed to keep the dressing dry. Be sure to change the dressing whenever it becomes moist. Your health care provider will tell you how often to change your dressing. 1. Wash your hands with soap and warm water. If soap and water are not available, use hand sanitizer. 2. Gently remove the old dressing. Avoid using scissors to remove the dressing. Sharp objects may damage the catheter. 3. Wash the skin around the insertion site with mild, fragrance-free soap and warm water. Rinse well, then pat the area dry with a clean cloth. 4. Check the skin around the catheter for signs of infection. Check for: ? Skin breakdown. ? Redness, swelling, or pain. ? Fluid or blood. ? Warmth. ? Pus or a bad smell. 5. If your catheter was stitched (sutured) to your skin, look at the suture to make sure it is still anchored in your skin. 6. Do not apply creams, ointments, or alcohol to the area. Let your skin air-dry completely before you apply a new dressing. 7. Curl the catheter into loops and place it on the sterile catheter pad. Do not place the catheter on your skin. 8. If you do not have a pad, use a clean dressing. Slide the dressing under the disk that holds the drainage catheter in place. 9. Use gauze to cover the catheter and the catheter pad. The catheter should rest on the pad or dressing, not on your skin. 10. Tape the dressing to your skin. You may be instructed to use an  adhesive dressing covering instead of gauze and tape. 11. Wash your hands with soap and warm water. If soap and water are not available, use hand sanitizer. General recommendations  Always wash your hands with soap and warm water before and after caring for your catheter and drainage bottle. Use a mild, fragrance-free soap. If soap and water are not available, use hand sanitizer.  Always make sure there are no leaks in the catheter or drainage bottle.  Each time you drain the catheter, note the color and amount of fluid.  Do not touch the tip of the catheter or the drainage bottle tubing.  Do not reuse drainage bottles.  Do not take baths, swim, or use a hot tub until your health care provider approves. Ask your health care provider if you may take showers. You may only be allowed to take sponge baths.  Take  deep breaths regularly, followed by a cough. Doing this can help to prevent lung infection. Contact a health care provider if:  You have any questions about caring for your catheter or drainage bottle.  You still have pain at the catheter insertion site more than 2 days after your procedure.  You have pain while draining your catheter.  Your catheter becomes bent, twisted, or cracked.  The connection between the catheter and the collection bottle becomes loose.  You have any of these around your catheter insertion site or coming from it: ? Skin breakdown. ? Redness, swelling, or pain. ? Fluid or blood. ? Warmth. ? Pus or a bad smell. Get help right away if:  You have a fever or chills.  You have chest pain.  You have dizziness or shortness of breath.  You have severe redness, swelling, or pain at your catheter insertion site.  The catheter comes out.  The catheter is blocked or clogged. Summary  An indwelling pleural catheter is a thin, flexible tube that is inserted under your skin and into your chest. The catheter drains excess fluid that collects in the area  between the chest wall and the lungs (pleural space).  It is important to follow instructions from your health care provider about how to drain and care for your catheter.  Do not touch the tip of the catheter or the drainage bottle tubing.  Always wash your hands with soap and water before and after caring for your catheter and drainage bottle. If soap and water are not available, use hand sanitizer. This information is not intended to replace advice given to you by your health care provider. Make sure you discuss any questions you have with your health care provider. Document Released: 01/21/2017 Document Revised: 01/21/2017 Document Reviewed: 01/21/2017 Elsevier Interactive Patient Education  2019 [place]. Chest Tube Insertion, Adult, Care After This sheet gives you information about how to care for yourself after your procedure. Your health care provider may also give you more specific instructions. If you have problems or questions, contact your health care provider. What can I expect after the procedure? After the procedure, it is common to have:  Mild discomfort.  Slight bruising. Follow these instructions at home: Incision care   Follow instructions from your health care provider about how to take care of your incision. Make sure you: ? Wash your hands with soap and water before you change your bandage (dressing). If soap and water are not available, use hand sanitizer. ? Change your dressing as told by your health care provider. ? Leave stitches (sutures), skin glue, or adhesive strips in place. These skin closures may need to stay in place for 2 weeks or longer. If adhesive strip edges start to loosen and curl up, you may trim the loose edges. Do not remove adhesive strips completely unless your health care provider tells you to do that.  Check your incision area every day for signs of infection. Check for: ? More redness, swelling, or pain. ? More fluid or  blood. ? Warmth. ? Pus or a bad smell.  Do not take baths, swim, or use a hot tub until your health care provider approves. Ask your health care provider if you can take showers or have a sponge bath. You may need to keep the incision area dry for a few days. General instructions  Take over-the-counter and prescription medicines only as told by your health care provider.  Return to your normal activities as told by  your health care provider. Ask your health care provider what activities are safe.  Keep all follow-up visits as told by your health care provider. This is important.  Do not drive for 24 hours if you were given a medicine to help you relax (sedative). Contact a health care provider if:  You have chills or fever.  You have pain that is not controlled with medicine.  You have more redness, swelling, or pain around your incision.  You have more fluid or blood coming from your incision.  Your incision feels warm to the touch.  You have pus or a bad smell coming from the incision. Get help right away if:  You have chest pain or trouble breathing.  You develop red streaking on your skin around your incision. This information is not intended to replace advice given to you by your health care provider. Make sure you discuss any questions you have with your health care provider. Document Released: 07/19/2013 Document Revised: 04/17/2016 Document Reviewed: 03/11/2016 Elsevier Interactive Patient Education  2019 [place]. Moderate Conscious Sedation, Adult, Care After These instructions provide you with information about caring for yourself after your procedure. Your health care provider may also give you more specific instructions. Your treatment has been planned according to current medical practices, but problems sometimes occur. Call your health care provider if you have any problems or questions after your procedure. What can I expect after the procedure? After your  procedure, it is common:  To feel sleepy for several hours.  To feel clumsy and have poor balance for several hours.  To have poor judgment for several hours.  To vomit if you eat too soon. Follow these instructions at home: For at least 24 hours after the procedure:   Do not: ? Participate in activities where you could fall or become injured. ? Drive. ? Use heavy machinery. ? Drink alcohol. ? Take sleeping pills or medicines that cause drowsiness. ? Make important decisions or sign legal documents. ? Take care of children on your own.  Rest. Eating and drinking  Follow the diet recommended by your health care provider.  If you vomit: ? Drink water, juice, or soup when you can drink without vomiting. ? Make sure you have little or no nausea before eating solid foods. General instructions  Have a responsible adult stay with you until you are awake and alert.  Take over-the-counter and prescription medicines only as told by your health care provider.  If you smoke, do not smoke without supervision.  Keep all follow-up visits as told by your health care provider. This is important. Contact a health care provider if:  You keep feeling nauseous or you keep vomiting.  You feel light-headed.  You develop a rash.  You have a fever. Get help right away if:  You have trouble breathing. This information is not intended to replace advice given to you by your health care provider. Make sure you discuss any questions you have with your health care provider. Document Released: 07/19/2013 Document Revised: 03/02/2016 Document Reviewed: 01/18/2016 Elsevier Interactive Patient Education  2019 [place] on my medicine - ELIQUIS (apixaban)  This medication education was reviewed with me or my healthcare representative as part of my discharge preparation.  The pharmacist that spoke with me during my hospital stay was:  Meeah Totino A, RPH  Why was Eliquis  prescribed for you? Eliquis was prescribed to treat blood clots that may have been found in the veins of your  legs (deep vein thrombosis) or in your lungs (pulmonary embolism) and to reduce the risk of them occurring again.  What do You need to know about Eliquis ? The starting dose is 10 mg (two 5 mg tablets) taken TWICE daily for the FIRST SEVEN (7) DAYS, then on (enter date)  01/15/2019  the dose is reduced to ONE 5 mg tablet taken TWICE daily.  Eliquis may be taken with or without food.   Try to take the dose about the same time in the morning and in the evening. If you have difficulty swallowing the tablet whole please discuss with your pharmacist how to take the medication safely.  Take Eliquis exactly as prescribed and DO NOT stop taking Eliquis without talking to the doctor who prescribed the medication.  Stopping may increase your risk of developing a new blood clot.  Refill your prescription before you run out.  After discharge, you should have regular check-up appointments with your healthcare provider that is prescribing your Eliquis.    What do you do if you miss a dose? If a dose of ELIQUIS is not taken at the scheduled time, take it as soon as possible on the same day and twice-daily administration should be resumed. The dose should not be doubled to make up for a missed dose.  Important Safety Information A possible side effect of Eliquis is bleeding. You should call your healthcare provider right away if you experience any of the following: ? Bleeding from an injury or your nose that does not stop. ? Unusual colored urine (red or dark brown) or unusual colored stools (red or black). ? Unusual bruising for unknown reasons. ? A serious fall or if you hit your head (even if there is no bleeding).  Some medicines may interact with Eliquis and might increase your risk of bleeding or clotting while on Eliquis. To help avoid this, consult your healthcare provider or pharmacist  prior to using any new prescription or non-prescription medications, including herbals, vitamins, non-steroidal anti-inflammatory drugs (NSAIDs) and supplements.  This website has more information on Eliquis (apixaban): http://[email or web address].com/eliquis/home

## 2019-01-06 NOTE — ED Notes (Signed)
Patient went to IR.

## 2019-01-07 ENCOUNTER — Inpatient Hospital Stay (HOSPITAL_COMMUNITY): Payer: Managed Care, Other (non HMO)

## 2019-01-07 DIAGNOSIS — I2699 Other pulmonary embolism without acute cor pulmonale: Secondary | ICD-10-CM

## 2019-01-07 LAB — COMPREHENSIVE METABOLIC PANEL
ALT: 26 U/L (ref 0–44)
AST: 17 U/L (ref 15–41)
Albumin: 3 g/dL — ABNORMAL LOW (ref 3.5–5.0)
Alkaline Phosphatase: 67 U/L (ref 38–126)
Anion gap: 9 (ref 5–15)
BUN: 13 mg/dL (ref 6–20)
CO2: 24 mmol/L (ref 22–32)
Calcium: 8.6 mg/dL — ABNORMAL LOW (ref 8.9–10.3)
Chloride: 106 mmol/L (ref 98–111)
Creatinine, Ser: 0.75 mg/dL (ref 0.44–1.00)
GFR calc Af Amer: >60 mL/min (ref >60)
GFR calc non Af Amer: >60 mL/min (ref >60)
GLUCOSE: 131 mg/dL — AB (ref 70–99)
Potassium: 3.7 mmol/L (ref 3.5–5.1)
Sodium: 139 mmol/L (ref 135–145)
Total Bilirubin: 0.9 mg/dL (ref 0.3–1.2)
Total Protein: 5.5 g/dL — ABNORMAL LOW (ref 6.5–8.1)

## 2019-01-07 LAB — CBC
HCT: 38.4 % (ref 36.0–46.0)
Hemoglobin: 12.9 g/dL (ref 12.0–15.0)
MCH: 31 pg (ref 26.0–34.0)
MCHC: 33.6 g/dL (ref 30.0–36.0)
MCV: 92.3 fL (ref 80.0–100.0)
Platelets: 190 10*3/uL (ref 150–400)
RBC: 4.16 MIL/uL (ref 3.87–5.11)
RDW: 13.3 % (ref 11.5–15.5)
WBC: 10.8 10*3/uL — ABNORMAL HIGH (ref 4.0–10.5)
nRBC: 0 % (ref 0.0–0.2)

## 2019-01-07 LAB — HEPARIN LEVEL (UNFRACTIONATED)
Heparin Unfractionated: 0.42 IU/mL (ref 0.30–0.70)
Heparin Unfractionated: 0.33 IU/mL (ref 0.30–0.70)
Heparin Unfractionated: 0.64 IU/mL (ref 0.30–0.70)

## 2019-01-07 LAB — ECHOCARDIOGRAM LIMITED
Height: 67 in
Weight: 2544 [oz_av]

## 2019-01-07 MED ORDER — HEPARIN (PORCINE) 25000 UT/250ML-% IV SOLN
1450.0000 [IU]/h | INTRAVENOUS | Status: DC
  Administered 2019-01-07 – 2019-01-08 (×2): 1450 [IU]/h via INTRAVENOUS
  Filled 2019-01-07 (×2): qty 250

## 2019-01-07 NOTE — Progress Notes (Addendum)
[place] for heparin Indication: pulmonary embolus  Allergies  Allergen Reactions  . Albuterol Hives and Itching    Patient Measurements: Height: 5\' 7"  (170.2 cm) Weight: 159 lb (72.1 kg) IBW/kg (Calculated) : 61.6 Heparin Dosing Weight: 77.6  Vital Signs: Temp: 98.6 F (37 C) (03/28 1413) Temp Source: Oral (03/28 1413) BP: 123/80 (03/28 1413) Pulse Rate: 87 (03/28 1413)  Labs: Recent Labs    01/06/19 1253 01/07/19 0212 01/07/19 0904 01/07/19 1651  HGB 14.1 12.9  --   --   HCT 41.8 38.4  --   --   PLT 215 190  --   --   LABPROT 13.2  --   --   --   INR 1.0  --   --   --   HEPARINUNFRC  --  0.42 0.33 0.64  CREATININE 0.64 0.75  --   --     Estimated Creatinine Clearance: 75.4 mL/min (by C-G formula based on SCr of 0.75 mg/dL).   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 IR placed a pleurex catheter in her L pleural space in AM prior to admission but when SOB not resolved, CTA done that revealed multiple R PEs with associated RH strain   Baseline INR WNL, aPTT: not done  Prior anticoagulation: none  Significant events:  Today, 01/07/2019:  CBC: counts lower but remains WNL  Most recent heparin level therapeutic on 1250 units/hr but on lower end of range  No SOB, CP, or hemoptysis; BP wnl, no longer tachy, satting well on RA  No bleeding or infusion issues per nursing (PleurX site looks good)  Goal of Therapy: Heparin level 0.5-0.7 units/ml (given multiple PE w/ RH strain) Monitor platelets by anticoagulation protocol: Yes  Plan:  Increase heparin IV infusion to 1450 units/hr; would like to target higher end of range given severity of disease, but given recent PleurX placement will avoid bolus  Check heparin level 6 hrs after rate change  Daily CBC, daily heparin level once stable  Monitor for signs of bleeding or  thrombosis  DOAC may be option, but given malignancy, would solicit Onc input for long-term anticoagulation recommendations   Reuel Boom, PharmD, BCPS [phone] 01/07/2019, 5:25 PM   ADDENDUM  6 hr heparin level therapeutic at 0.64 after rate increase to 1450 units/hr  Continue heparin at 1450 units/hr  Recheck heparin level with AM labs  Reuel Boom, PharmD, BCPS [phone] 01/07/2019, 5:36 PM

## 2019-01-07 NOTE — Progress Notes (Signed)
ANTICOAGULATION CONSULT NOTE - Follow Up Consult  Pharmacy Consult for Heparin Indication: pulmonary embolus  Allergies  Allergen Reactions  . Albuterol Hives and Itching    Patient Measurements: Height: 5\' 7"  (170.2 cm) Weight: 159 lb (72.1 kg) IBW/kg (Calculated) : 61.6 Heparin Dosing Weight:   Vital Signs: Temp: 98.3 F (36.8 C) (03/27 2245) Temp Source: Oral (03/27 2245) BP: 115/84 (03/27 2245) Pulse Rate: 115 (03/27 2245)  Labs: Recent Labs    01/06/19 1253 01/07/19 0212  HGB 14.1 12.9  HCT 41.8 38.4  PLT 215 190  LABPROT 13.2  --   INR 1.0  --   HEPARINUNFRC  --  0.42  CREATININE 0.64 0.75    Estimated Creatinine Clearance: 75.4 mL/min (by C-G formula based on SCr of 0.75 mg/dL).   Medications:  Infusions:  . dextrose 5 % and 0.45% NaCl 75 mL/hr at 01/07/19 0023  . heparin 1,250 Units/hr (01/06/19 1926)  . levofloxacin (LEVAQUIN) IV 750 mg (01/07/19 0046)    Assessment: Patient with heparin level at goal.  No heparin issues noted.  Goal of Therapy:  Heparin level 0.3-0.7 units/ml Monitor platelets by anticoagulation protocol: Yes   Plan:  Continue heparin drip at current rate Recheck level at 0900  Tyler Deis, Shea Stakes [place] 01/07/2019,4:42 AM

## 2019-01-07 NOTE — Progress Notes (Signed)
PROGRESS NOTE    Tina Cruz  WCB:[id number] DOB: Jun 26, 1961 DOA: 01/06/2019 PCP: [name], MD   Brief Narrative:  58 year old with past medical history relevant for metastatic lung cancer with brain metastatic disease status post radiation to the brain and chest and bones complicated by recurrent left malignant pleural effusion, hypothyroidism, reflux, COPD, depression/anxiety who was admitted with worsening shortness of breath and found to have malignant pleural effusion status post thoracentesis and placement of Pleurx catheter and CTA showing evidence of pulmonary embolism with evidence of right heart strain.   Assessment & Plan:   Principal Problem:   Acute pulmonary embolism (HCC) Active Problems:   Thyroid disease   GERD (gastroesophageal reflux disease)   Adenocarcinoma of left lung, stage 4 (HCC)   Recurrent left pleural effusion   Bone metastasis (HCC)   #) Acute pulmonary embolism: Noted on CTA that was performed after Pleurx catheter placement and thoracentesis.  Patient has evidence of right heart strain on imaging.  Fortunately she has been weaned off oxygen. -Continue IV heparin drip, will transition to likely enoxaparin or novel oral anticoagulant - Echo ordered -Vascular ultrasound lower extremity ordered  #) Malignant pleural effusion status post Pleurx catheter: Pleurx catheter was placed in left lung in the emergency department by interventional radiology.  2.2 L removed. - We will provide teaching on home Pleurx drainage  #) Metastatic adenocarcinoma of the lung/brain mets/bone mets: Patient is not started chemotherapy yet but has received radiation to the brain and is received radiation to the chest. - Continue dexamethasone 2 mg daily  #) GERD: -Continue PPI  #) Hypothyroidism: -Continue levothyroxine 112 mcg daily  #) Pain/psych: -Continue PRN oxycodone  Fluids: Tolerating p.o. Electrolytes: Monitor and supplement Nutrition: Regular  diet  Prophylaxis: Heparin drip  Disposition: Pending PE work-up and novel oral anticoagulant versus enoxaparin  Full code   Consultants:   Interventional radiology  Oncology  Procedures:   Tunneled left Pleurx catheter and removal of 2 L of pleural fluid on 01/06/2019  Antimicrobials:  None   Subjective: This morning the patient reports that she is feeling 100% better.  She is not had any further dyspnea, chest pain, nausea, vomiting, diarrhea.  Objective: Vitals:   01/06/19 2208 01/06/19 2244 01/06/19 2245 01/07/19 0518  BP: (!) 137/94  115/84 104/72  Pulse: (!) 116  (!) 115 86  Resp: 20  18 18   Temp:   98.3 F (36.8 C) 98 F (36.7 C)  TempSrc:   Oral Oral  SpO2: 95%  93% 99%  Weight:  72.1 kg    Height:  5\' 7"  (1.702 m)      Intake/Output Summary (Last 24 hours) at 01/07/2019 0928 Last data filed at 01/06/2019 1500 Gross per 24 hour  Intake --  Output 2000 ml  Net -2000 ml   Filed Weights   01/06/19 2244  Weight: 72.1 kg    Examination:  General exam: Appears calm and comfortable  Respiratory system: Clear to auscultation.  No increased work of breathing, diminished lung sounds on left base Cardiovascular system: Regular rate and rhythm, no murmurs Gastrointestinal system: Soft, nondistended, no rebound or guarding, plus bowel sounds Central nervous system: Alert and oriented.  Grossly intact, moving all extremities Extremities: No lower extremity edema Skin: Pleurx site in the left thorax clean dry and intact Psychiatry: Judgement and insight appear normal. Mood & affect appropriate.     Data Reviewed: I have personally reviewed following labs and imaging studies  CBC: Recent Labs  Lab 01/06/19 1253 01/07/19 0212  WBC 12.5* 10.8*  NEUTROABS 11.5*  --   HGB 14.1 12.9  HCT 41.8 38.4  MCV 92.7 92.3  PLT 215 416   Basic Metabolic Panel: Recent Labs  Lab 01/06/19 1253 01/07/19 0212  NA 139 139  K 3.7 3.7  CL 103 106  CO2 25 24   GLUCOSE 142* 131*  BUN 12 13  CREATININE 0.64 0.75  CALCIUM 9.3 8.6*   GFR: Estimated Creatinine Clearance: 75.4 mL/min (by C-G formula based on SCr of 0.75 mg/dL). Liver Function Tests: Recent Labs  Lab 01/06/19 1253 01/07/19 0212  AST 27 17  ALT 38 26  ALKPHOS 81 67  BILITOT 1.6* 0.9  PROT 7.1 5.5*  ALBUMIN 4.0 3.0*   No results for input(s): LIPASE, AMYLASE in the last 168 hours. No results for input(s): AMMONIA in the last 168 hours. Coagulation Profile: Recent Labs  Lab 01/06/19 1253  INR 1.0   Cardiac Enzymes: No results for input(s): CKTOTAL, CKMB, CKMBINDEX, TROPONINI in the last 168 hours. BNP (last 3 results) No results for input(s): PROBNP in the last 8760 hours. HbA1C: No results for input(s): HGBA1C in the last 72 hours. CBG: No results for input(s): GLUCAP in the last 168 hours. Lipid Profile: No results for input(s): CHOL, HDL, LDLCALC, TRIG, CHOLHDL, LDLDIRECT in the last 72 hours. Thyroid Function Tests: No results for input(s): TSH, T4TOTAL, FREET4, T3FREE, THYROIDAB in the last 72 hours. Anemia Panel: No results for input(s): VITAMINB12, FOLATE, FERRITIN, TIBC, IRON, RETICCTPCT in the last 72 hours. Sepsis Labs: No results for input(s): PROCALCITON, LATICACIDVEN in the last 168 hours.  No results found for this or any previous visit (from the past 240 hour(s)).       Radiology Studies: Ct Angio Chest Pe W And/or Wo Contrast  Result Date: 01/06/2019 CLINICAL DATA:  Dyspnea with stage IV lung cancer.  Hypoxia. EXAM: CT ANGIOGRAPHY CHEST WITH CONTRAST TECHNIQUE: Multidetector CT imaging of the chest was performed using the standard protocol during bolus administration of intravenous contrast. Multiplanar CT image reconstructions and MIPs were obtained to evaluate the vascular anatomy. CONTRAST:  180mL OMNIPAQUE IOHEXOL 350 MG/ML SOLN COMPARISON:  Head CT 12/12/2018 and chest CT 11/11/2018 FINDINGS: Cardiovascular: Conventional branch pattern of  the great vessels. Nonaneurysmal thoracic aorta. Acute pulmonary emboli noted on the right starting from the bifurcation of the right main pulmonary artery extending into the right upper and lower lobe lobar and segmental branches. Right heart strain is identified with RV/LV ratio 1.2. Mediastinum/Nodes: Small subcentimeter mediastinal lymph nodes with previously described metabolically active left-sided supraclavicular lymph nodes, the largest approximately 9 mm. No thyromegaly or mass. Patent trachea and mainstem bronchi with slight luminal narrowing of the distal left upper lobe bronchi from known mass and consolidation. Lungs/Pleura: Redemonstration of masslike opacity and postobstructive consolidation of the medial left upper lobe. The margins of the mass are difficult to identified due to the adjacent abutting pulmonary consolidations likely representing postobstructive change. The degree of postobstructive consolidation appears slightly increased since prior. Moderate to large left pleural effusion with adjacent left basilar compressive atelectasis with atelectasis along the left major fissure is identified. The volume of fluid in the left hemithorax has increased slightly from the November 11, 2018 exam though is decreased in appearance relative to the more recent prior PET-CT from 12/12/2018. The tip of a Pleurx catheter terminates at the posterior fourth rib level. Patchy airspace disease in the left upper lobe, lingula and left lower lobe can  not exclude superimposed pneumonia. Upper Abdomen: No acute abnormality. Musculoskeletal: Redemonstration of scattered osseous metastasis, at the base of the left glenoid, midthoracic spine and ribs better visualized on prior PET-CT. Review of the MIP images confirms the above findings. IMPRESSION: 1. Acute multilobar right-sided pulmonary emboli to the right upper and lower lobes with right heart strain, RV/LV ratio 1.2. 2. New patchy airspace opacities within the  aerated left upper lobe, lingula and left lower lobe. Pneumonia is not excluded. 3. Redemonstration of left upper lobe mass with postobstructive consolidation and moderate to large left pleural effusion. PleurX catheter is noted within effusion. 4. Osseous metastatic disease to the dorsal spine, left scapula and ribs. These results were called by telephone at the time of interpretation on 01/06/2019 at 7:11 pm to Dr. Nanda Quinton , who verbally acknowledged these results. Electronically Signed   By: Ashley Royalty M.D.   On: 01/06/2019 19:14   Dg Chest Port 1 View  Result Date: 01/06/2019 CLINICAL DATA:  Status post left PleurX catheter placement for malignant pleural effusion secondary to adenocarcinoma of the lung. EXAM: PORTABLE CHEST 1 VIEW COMPARISON:  Film earlier today at 1228 hours FINDINGS: Left basilar PleurX catheter present. After fluid removal, there is reduction in amount of left pleural fluid volume with improved aeration of the left lung. Small amount of pleural fluid remains. No pneumothorax. Right lung remains clear. IMPRESSION: Decrease in left pleural fluid volume after placement of PleurX catheter and drainage of pleural fluid. No pneumothorax. Electronically Signed   By: Aletta Edouard M.D.   On: 01/06/2019 16:25   Dg Chest Portable 1 View  Result Date: 01/06/2019 CLINICAL DATA:  Shortness of breath, pleural effusion EXAM: PORTABLE CHEST 1 VIEW COMPARISON:  01/04/2019 FINDINGS: Large left pleural effusion unchanged with 01/04/2019. No right pleural effusion. No pneumothorax. Stable cardiomediastinal silhouette. No acute osseous abnormality. IMPRESSION: 1. Stable large left pleural effusion. Electronically Signed   By: Kathreen Devoid   On: 01/06/2019 12:49   Ir Perc Pleural Drain Genia Harold Cath W/img Guide  Result Date: 01/06/2019 INDICATION: 58 year old with lung cancer and malignant left pleural effusion. Patient was scheduled for a tunneled pleural catheter by Thoracic Surgery today at  [facility]. Patient was at [facility] for radiation therapy but became short of breath and ended up in the Emergency Department. Interventional Radiology was consulted for placement of the tunneled pleural catheter. EXAM: PLACEMENT OF TUNNELED LEFT PLEURAL CATHETER WITH ULTRASOUND AND FLUOROSCOPIC GUIDANCE MEDICATIONS: Ancef 2 g ANESTHESIA/SEDATION: Fentanyl 50 mcg IV; Versed 1 mg IV Moderate Sedation Time:  24 minutes The patient was continuously monitored during the procedure by the interventional radiology nurse under my direct supervision. FLUOROSCOPY TIME:  30 seconds, 15 mGy COMPLICATIONS: None immediate. PROCEDURE: Informed written consent was obtained from the patient after a thorough discussion of the procedural risks, benefits and alternatives. All questions were addressed. Maximal Sterile Barrier Technique was utilized including caps, mask, sterile gowns, sterile gloves, sterile drape, hand hygiene and skin antiseptic. A timeout was performed prior to the initiation of the procedure. Ultrasound was used to identify a large amount of left pleural fluid. The left mid axillary line and left anterior lower chest area was prepped and draped in sterile fashion. Skin was anesthetized and 2 small incisions were made. 15.5 [place] PleurX catheter was tunneled between the incisions and the cuff was placed underneath the skin. A Yueh catheter was directed into the pleural space along the more lateral incision with ultrasound guidance. Yellow fluid  was aspirated. Wire was advanced into the pleural space and a peel-away sheath was placed. Catheter was directed through the peel-away sheath into the pleural space. 2 L of yellow pleural fluid was removed. Pleural entrance skin site was closed using absorbable suture and Dermabond. Catheter was sutured to skin with Prolene suture. Fluoroscopic and ultrasound images were taken and saved for documentation. IMPRESSION: Successful placement of a tunneled  left pleural catheter with ultrasound and fluoroscopic guidance. Electronically Signed   By: Markus Daft M.D.   On: 01/06/2019 16:07        Scheduled Meds:  dexamethasone  2 mg Oral Daily   feeding supplement (ENSURE ENLIVE)  237 mL Oral BID BM   levothyroxine  112 mcg Oral Q0600   loratadine  10 mg Oral Daily   polyethylene glycol  17 g Oral Daily   Continuous Infusions:  heparin 1,250 Units/hr (01/06/19 1926)   levofloxacin (LEVAQUIN) IV 750 mg (01/07/19 0046)     LOS: 1 day    Time spent: [place], MD [facility]  If 7PM-7AM, please contact night-coverage [email or web address] Password [facility] 01/07/2019, 9:28 AM

## 2019-01-07 NOTE — Progress Notes (Signed)
*  PRELIMINARY RESULTS* Echocardiogram 2D Echocardiogram LIMITED has been performed.  Leavy Cella 01/07/2019, 3:37 PM

## 2019-01-08 LAB — CBC
HCT: 37.2 % (ref 36.0–46.0)
HEMOGLOBIN: 11.7 g/dL — AB (ref 12.0–15.0)
MCH: 30.5 pg (ref 26.0–34.0)
MCHC: 31.5 g/dL (ref 30.0–36.0)
MCV: 97.1 fL (ref 80.0–100.0)
Platelets: 172 10*3/uL (ref 150–400)
RBC: 3.83 MIL/uL — ABNORMAL LOW (ref 3.87–5.11)
RDW: 13.6 % (ref 11.5–15.5)
WBC: 8.5 10*3/uL (ref 4.0–10.5)
nRBC: 0 % (ref 0.0–0.2)

## 2019-01-08 LAB — BASIC METABOLIC PANEL
Anion gap: 7 (ref 5–15)
BUN: 10 mg/dL (ref 6–20)
Calcium: 8.6 mg/dL — ABNORMAL LOW (ref 8.9–10.3)
Chloride: 106 mmol/L (ref 98–111)
Creatinine, Ser: 0.61 mg/dL (ref 0.44–1.00)
GFR calc Af Amer: >60 mL/min (ref >60)
GFR calc non Af Amer: >60 mL/min (ref >60)
Glucose, Bld: 102 mg/dL — ABNORMAL HIGH (ref 70–99)
Potassium: 4 mmol/L (ref 3.5–5.1)
Sodium: 138 mmol/L (ref 135–145)

## 2019-01-08 LAB — BASIC METABOLIC PANEL WITH GFR: CO2: 25 mmol/L (ref 22–32)

## 2019-01-08 LAB — MAGNESIUM: Magnesium: 2.2 mg/dL (ref 1.7–2.4)

## 2019-01-08 LAB — HIV ANTIBODY (ROUTINE TESTING W REFLEX): HIV Screen 4th Generation wRfx: NONREACTIVE

## 2019-01-08 LAB — HEPARIN LEVEL (UNFRACTIONATED): Heparin Unfractionated: 0.79 IU/mL — ABNORMAL HIGH (ref 0.30–0.70)

## 2019-01-08 MED ORDER — APIXABAN 5 MG PO TABS: 10.0000 mg | ORAL_TABLET | Freq: Two times a day (BID) | ORAL | 0 refills | Status: DC

## 2019-01-08 MED ORDER — APIXABAN (ELIQUIS) EDUCATION KIT FOR DVT/PE PATIENTS: 1.0000 | PACK | Freq: Once | 0 refills | Status: AC

## 2019-01-08 MED ORDER — HEPARIN (PORCINE) 25000 UT/250ML-% IV SOLN: 1350.0000 [IU]/h | INTRAVENOUS | Status: DC

## 2019-01-08 MED ORDER — APIXABAN (ELIQUIS) EDUCATION KIT FOR DVT/PE PATIENTS: 1.0000 | PACK | Freq: Once | 0 refills | Status: DC

## 2019-01-08 MED ORDER — APIXABAN 5 MG PO TABS
10.0000 mg | ORAL_TABLET | Freq: Two times a day (BID) | ORAL | Status: DC
  Administered 2019-01-08: 10 mg via ORAL
  Filled 2019-01-08: qty 2

## 2019-01-08 MED ORDER — APIXABAN 5 MG PO TABS: 5.0000 mg | ORAL_TABLET | Freq: Two times a day (BID) | ORAL | 0 refills | Status: DC

## 2019-01-08 NOTE — Discharge Summary (Addendum)
Physician Discharge Summary  Tina Cruz OMA:[id number] DOB: Aug 20, 1961 DOA: 01/06/2019  PCP: [name], MD  Admit date: 01/06/2019 Discharge date: 01/08/2019  Admitted From: Home Disposition: Home  Recommendations for Outpatient Follow-up:  1. Follow up with PCP in 1-2 weeks 2. Please obtain BMP/CBC in one week 3. Please take the Eliquis 10 mg twice a day for 7 days and then decrease to 5 mg twice a day indefinitely 4. Please drain your Pleurx as described   Home Health: No Equipment/Devices: Pleurx catheter placed 01/06/2019  Discharge Condition: Stable CODE STATUS: Full Diet recommendation: Regular  Brief/Interim Summary:  #) Acute pulmonary embolism: This was noted on CTA that was performed after Pleurx catheter was placed and thoracentesis was performed.  She was rapidly weaned off oxygen.  There was some evidence of right heart strain on CT scan however on discussion with cardiology it was felt that echo was not needed at this time particularly with novel coronavirus restrictions.  Similarly a lower extremity Doppler ultrasound was not ordered due to novel coronavirus restrictions and no change in management.  Initially patient was on IV heparin drip and then transition to oral apixaban load.  Patient will follow-up as an outpatient with her oncologist for further management of anticoagulation.  #) Malignant pleural effusion status post Pleurx catheter: Patient did have Pleurx catheter placed on 01/06/2019 with interventional radiology.  2.2 L of fluid was removed.  Patient was given teaching and additional negative suction Vacutainer's.  #) Metastatic adenocarcinoma of the lung with brain metastatic disease: Patient is not started on chemotherapy but has received brain radiation and chest radiation.  She was continued on dexamethasone for brain metastatic disease.  #) GERD: Patient was continued on PPI.  #) Hypothyroidism: Patient was continued on home  levothyroxine.  Discharge Diagnoses:  Principal Problem:   Acute pulmonary embolism (HCC) Active Problems:   Thyroid disease   GERD (gastroesophageal reflux disease)   Adenocarcinoma of left lung, stage 4 (HCC)   Recurrent left pleural effusion   Bone metastasis [facility])    Discharge Instructions  Discharge Instructions    Call MD for:  difficulty breathing, headache or visual disturbances   Complete by:  As directed    Call MD for:  hives   Complete by:  As directed    Call MD for:  persistant dizziness or light-headedness   Complete by:  As directed    Call MD for:  persistant nausea and vomiting   Complete by:  As directed    Call MD for:  redness, tenderness, or signs of infection (pain, swelling, redness, odor or green/yellow discharge around incision site)   Complete by:  As directed    Call MD for:  severe uncontrolled pain   Complete by:  As directed    Call MD for:  temperature >100.4   Complete by:  As directed    Diet - low sodium heart healthy   Complete by:  As directed    Discharge instructions   Complete by:  As directed    Please take 10 mg of apixaban twice a day for 7 days and then decrease to 5 mg twice a day.  Please follow-up with your primary care doctor.   Increase activity slowly   Complete by:  As directed      Allergies as of 01/08/2019      Reactions   Albuterol Hives, Itching      Medication List    STOP taking these medications  oxyCODONE 5 MG immediate release tablet Commonly known as:  Oxy IR/ROXICODONE     TAKE these medications   acetaminophen 325 MG tablet Commonly known as:  TYLENOL Take 325 mg by mouth every 6 (six) hours as needed for mild pain or headache.   Advil 200 MG tablet Generic drug:  ibuprofen Take 200 mg by mouth every 8 (eight) hours as needed for moderate pain.   apixaban Kit Commonly known as:  ELIQUIS 1 kit by Does not apply route once for 1 dose.   azelastine 0.1 % nasal spray Commonly known as:   ASTELIN Place 1 spray into both nostrils 2 (two) times daily as needed for rhinitis. Use in each nostril as directed   Cetirizine HCl 10 MG Caps Commonly known as:  ZyrTEC Allergy Take 1 capsule (10 mg total) by mouth daily as needed (allergies).   dexamethasone 4 MG tablet Commonly known as:  DECADRON Take 1 tablet (4 mg total) by mouth 2 (two) times daily. What changed:    how much to take  when to take this   famotidine 20 MG tablet Commonly known as:  PEPCID Take 20 mg by mouth daily as needed for heartburn.   levalbuterol 45 MCG/ACT inhaler Commonly known as:  XOPENEX HFA Inhale 1-2 puffs into the lungs every 4 (four) hours as needed for wheezing or shortness of breath.   levothyroxine 112 MCG tablet Commonly known as:  SYNTHROID, LEVOTHROID TAKE 1 TABLET (112 MCG TOTAL) BY MOUTH DAILY BEFORE BREAKFAST.   LORazepam 1 MG tablet Commonly known as:  ATIVAN Take 1 tablet about 30 min before wearing mask for brain treatment. Must have a driver after taking this. Take 1 tab PRN before MRIs in the future, if feeling anxious.   magnesium citrate Soln Take 0.5 Bottles by mouth daily as needed for moderate constipation or severe constipation.   polyethylene glycol packet Commonly known as:  MIRALAX / GLYCOLAX Take 17 g by mouth daily.      Follow-up Information    Ivin Poot, MD.   Specialty:  Cardiothoracic Surgery Why:  As needed Contact information: [address] [place] [address] [phone]        [facility] Follow up.   Why:  will arrange Home Health  Contact information: [phone]         Allergies  Allergen Reactions  . Albuterol Hives and Itching    Consultations:  Interventional radiology     Procedures/Studies: Dg Chest 2 View  Result Date: 01/04/2019 CLINICAL DATA:  Malignant pleural effusion. EXAM: CHEST - 2 VIEW COMPARISON:  12/12/2018 FINDINGS: Increased densities throughout the left chest compatible  with a large left pleural effusion. There appears to be mediastinal shift towards the right due to the large effusion. Left pleural effusion has enlarged from the prior imaging and there is a small amount of residual aeration in the left upper lung. Right lung is clear. No acute bone abnormality. Left side of the heart is obscured by the large left pleural effusion. IMPRESSION: Large left pleural effusion. Pleural effusion has enlarged since the PET-CT on 12/12/2018. Right mediastinal shift due to the large left pleural effusion. Electronically Signed   By: Markus Daft M.D.   On: 01/04/2019 12:27   Ct Angio Chest Pe W And/or Wo Contrast  Result Date: 01/06/2019 CLINICAL DATA:  Dyspnea with stage IV lung cancer.  Hypoxia. EXAM: CT ANGIOGRAPHY CHEST WITH CONTRAST TECHNIQUE: Multidetector CT imaging of the chest was performed  using the standard protocol during bolus administration of intravenous contrast. Multiplanar CT image reconstructions and MIPs were obtained to evaluate the vascular anatomy. CONTRAST:  171m OMNIPAQUE IOHEXOL 350 MG/ML SOLN COMPARISON:  Head CT 12/12/2018 and chest CT 11/11/2018 FINDINGS: Cardiovascular: Conventional branch pattern of the great vessels. Nonaneurysmal thoracic aorta. Acute pulmonary emboli noted on the right starting from the bifurcation of the right main pulmonary artery extending into the right upper and lower lobe lobar and segmental branches. Right heart strain is identified with RV/LV ratio 1.2. Mediastinum/Nodes: Small subcentimeter mediastinal lymph nodes with previously described metabolically active left-sided supraclavicular lymph nodes, the largest approximately 9 mm. No thyromegaly or mass. Patent trachea and mainstem bronchi with slight luminal narrowing of the distal left upper lobe bronchi from known mass and consolidation. Lungs/Pleura: Redemonstration of masslike opacity and postobstructive consolidation of the medial left upper lobe. The margins of the mass  are difficult to identified due to the adjacent abutting pulmonary consolidations likely representing postobstructive change. The degree of postobstructive consolidation appears slightly increased since prior. Moderate to large left pleural effusion with adjacent left basilar compressive atelectasis with atelectasis along the left major fissure is identified. The volume of fluid in the left hemithorax has increased slightly from the November 11, 2018 exam though is decreased in appearance relative to the more recent prior PET-CT from 12/12/2018. The tip of a Pleurx catheter terminates at the posterior fourth rib level. Patchy airspace disease in the left upper lobe, lingula and left lower lobe can not exclude superimposed pneumonia. Upper Abdomen: No acute abnormality. Musculoskeletal: Redemonstration of scattered osseous metastasis, at the base of the left glenoid, midthoracic spine and ribs better visualized on prior PET-CT. Review of the MIP images confirms the above findings. IMPRESSION: 1. Acute multilobar right-sided pulmonary emboli to the right upper and lower lobes with right heart strain, RV/LV ratio 1.2. 2. New patchy airspace opacities within the aerated left upper lobe, lingula and left lower lobe. Pneumonia is not excluded. 3. Redemonstration of left upper lobe mass with postobstructive consolidation and moderate to large left pleural effusion. PleurX catheter is noted within effusion. 4. Osseous metastatic disease to the dorsal spine, left scapula and ribs. These results were called by telephone at the time of interpretation on 01/06/2019 at 7:11 pm to Dr. JNanda Quinton, who verbally acknowledged these results. Electronically Signed   By: DAshley RoyaltyM.D.   On: 01/06/2019 19:14   Mr BJeri CosWJSContrast  Result Date: 12/25/2018 CLINICAL DATA:  S RS brain study. Treatment planning for new diagnosis of non-small cell lung cancer. Right parietal metastasis shown on previous exam. Calvarial metastases  shown on previous exam. Left frontal metastasis on previous exam. EXAM: MRI HEAD WITHOUT AND WITH CONTRAST TECHNIQUE: Multiplanar, multiecho pulse sequences of the brain and surrounding structures were obtained without and with intravenous contrast. CONTRAST:  111mMULTIHANCE GADOBENATE DIMEGLUMINE 529 MG/ML IV SOLN COMPARISON:  12/07/2018 FINDINGS: Brain: The brainstem and cerebellum are normal. Cerebral hemispheres show minimal small vessel change of the white matter without significant ischemic changes. There is a 13 x 14 mm metastasis in what probably represents the precentral gyrus of the posterior frontal lobe. There is moderate surrounding edema. There is a second probable metastasis in the left frontal lobe, axial image 92, measuring 2-3 mm. There is an adjacent vessel, but I think this is probably a small metastasis. No third lesion is identified. No evidence of hydrocephalus or extra-axial collection. Vascular: Major vessels at the base of the  brain show flow. Skull and upper cervical spine: Multiple calvarial metastases as noted by arrows. Sinuses/Orbits: Clear/normal Other: None IMPRESSION: 13 x 14 mm metastasis with surrounding vasogenic edema in the posterior right frontal lobe axial image 117. Probable 2-3 mm metastasis in the left frontal lobe axial image 92. Multiple calvarial metastases as marked by arrows. Electronically Signed   By: Nelson Chimes M.D.   On: 12/25/2018 14:36   Nm Pet Image Initial (pi) Skull Base To Thigh  Result Date: 12/12/2018 CLINICAL DATA:  Initial treatment strategy for LEFT upper lobe mass EXAM: NUCLEAR MEDICINE PET SKULL BASE TO THIGH TECHNIQUE: Nine point mCi F-18 FDG was injected intravenously. Full-ring PET imaging was performed from the skull base to thigh after the radiotracer. CT data was obtained and used for attenuation correction and anatomic localization. Fasting blood glucose: 94 mg/dl COMPARISON:  CT 11/11/2018 FINDINGS: Mediastinal blood pool activity: SUV  max 2.5 NECK: Hypermetabolic LEFT supraclavicular node with SUV max equal 7.4 measures 10 mm short axis (image 55/4) Incidental CT findings: None CHEST: A partially calcified hypermetabolic LEFT suprahilar mass surrounds the LEFT upper lobe bronchus. Mass is intensely hypermetabolic with SUV max equal 12.9. Mass measures approximately 3.5 x 2.5 cm. Post obstructive segmental collapse of the LEFT upper lobe. There is hypermetabolic nodal extension to the mediastinum with a hypermetabolic AP window node which difficult define on the CT portion but evident on the PET data. Along the most inferior aspect of the pleural surface of the LEFT hemithorax there is hypermetabolic rim of metabolic activity (image 94 for example). Large LEFT effusion. Incidental CT findings: Large LEFT pleural effusion ABDOMEN/PELVIS: Focal metabolic activity of the RIGHT adrenal gland with SUV max equal 6.3. Minimal enlargement the body to 8 mm. LEFT adrenal gland appears normal. No abnormal activity in liver. No hypermetabolic abdominopelvic lymph nodes. Incidental CT findings: none SKELETON: Multiple foci of intense focal metabolic activity within the spine, sacrum pelvis and including the RIGHT femur and LEFT scapula. Example lesion in the transverse process of the T2 vertebral body on the LEFT with SUV max equal 11.9. There is destructive lesion at this level. Example lesion in the LEFT acetabulum with SUV max equal 15.0. Subtle lucent lesion. Example lesion in the L 3 vertebral body with SUV max equal 17.9. Subtle lytic lesion measuring 2 cm this level. Incidental CT findings: No pathologic fracture IMPRESSION: 1. Intensely hypermetabolic LEFT suprahilar mass consistent primary bronchogenic carcinoma. Post obstructive collapse of the LEFT upper lobe. 2. Evidence of hypermetabolic nodal metastasis including the AP window and LEFT supraclavicular node. LEFT supraclavicular node may be amenable to biopsy. 3. Evidence of pleural metastasis in  the LEFT hemithorax. Large LEFT pleural effusion. 4. Multiple foci of hypermetabolic lytic skeletal metastasis in the spine, scapula, pelvis, and RIGHT femur. 5. High concern for small RIGHT adrenal metastasis. Electronically Signed   By: Suzy Bouchard M.D.   On: 12/12/2018 10:15   Dg Chest Port 1 View  Result Date: 01/06/2019 CLINICAL DATA:  Status post left PleurX catheter placement for malignant pleural effusion secondary to adenocarcinoma of the lung. EXAM: PORTABLE CHEST 1 VIEW COMPARISON:  Film earlier today at 1228 hours FINDINGS: Left basilar PleurX catheter present. After fluid removal, there is reduction in amount of left pleural fluid volume with improved aeration of the left lung. Small amount of pleural fluid remains. No pneumothorax. Right lung remains clear. IMPRESSION: Decrease in left pleural fluid volume after placement of PleurX catheter and drainage of pleural fluid. No pneumothorax.  Electronically Signed   By: Aletta Edouard M.D.   On: 01/06/2019 16:25   Dg Chest Portable 1 View  Result Date: 01/06/2019 CLINICAL DATA:  Shortness of breath, pleural effusion EXAM: PORTABLE CHEST 1 VIEW COMPARISON:  01/04/2019 FINDINGS: Large left pleural effusion unchanged with 01/04/2019. No right pleural effusion. No pneumothorax. Stable cardiomediastinal silhouette. No acute osseous abnormality. IMPRESSION: 1. Stable large left pleural effusion. Electronically Signed   By: Kathreen Devoid   On: 01/06/2019 12:49   Ir Perc Pleural Drain Genia Harold Cath W/img Guide  Result Date: 01/06/2019 INDICATION: 58 year old with lung cancer and malignant left pleural effusion. Patient was scheduled for a tunneled pleural catheter by Thoracic Surgery today at [facility]. Patient was at [facility] for radiation therapy but became short of breath and ended up in the Emergency Department. Interventional Radiology was consulted for placement of the tunneled pleural catheter. EXAM: PLACEMENT OF  TUNNELED LEFT PLEURAL CATHETER WITH ULTRASOUND AND FLUOROSCOPIC GUIDANCE MEDICATIONS: Ancef 2 g ANESTHESIA/SEDATION: Fentanyl 50 mcg IV; Versed 1 mg IV Moderate Sedation Time:  24 minutes The patient was continuously monitored during the procedure by the interventional radiology nurse under my direct supervision. FLUOROSCOPY TIME:  30 seconds, 15 mGy COMPLICATIONS: None immediate. PROCEDURE: Informed written consent was obtained from the patient after a thorough discussion of the procedural risks, benefits and alternatives. All questions were addressed. Maximal Sterile Barrier Technique was utilized including caps, mask, sterile gowns, sterile gloves, sterile drape, hand hygiene and skin antiseptic. A timeout was performed prior to the initiation of the procedure. Ultrasound was used to identify a large amount of left pleural fluid. The left mid axillary line and left anterior lower chest area was prepped and draped in sterile fashion. Skin was anesthetized and 2 small incisions were made. 15.5 [place] PleurX catheter was tunneled between the incisions and the cuff was placed underneath the skin. A Yueh catheter was directed into the pleural space along the more lateral incision with ultrasound guidance. Yellow fluid was aspirated. Wire was advanced into the pleural space and a peel-away sheath was placed. Catheter was directed through the peel-away sheath into the pleural space. 2 L of yellow pleural fluid was removed. Pleural entrance skin site was closed using absorbable suture and Dermabond. Catheter was sutured to skin with Prolene suture. Fluoroscopic and ultrasound images were taken and saved for documentation. IMPRESSION: Successful placement of a tunneled left pleural catheter with ultrasound and fluoroscopic guidance. Electronically Signed   By: Markus Daft M.D.   On: 01/06/2019 16:07     Subjective:   Discharge Exam: Vitals:   01/07/19 1413 01/08/19 0300  BP: 123/80 112/75  Pulse: 87 67  Resp:  16 20  Temp: 98.6 F (37 C) 97.8 F (36.6 C)  SpO2: 91% 95%   Vitals:   01/06/19 2245 01/07/19 0518 01/07/19 1413 01/08/19 0300  BP: 115/84 104/72 123/80 112/75  Pulse: (!) 115 86 87 67  Resp: '18 18 16 20  ' Temp: 98.3 F (36.8 C) 98 F (36.7 C) 98.6 F (37 C) 97.8 F (36.6 C)  TempSrc: Oral Oral Oral Oral  SpO2: 93% 99% 91% 95%  Weight:      Height:       General exam: Appears calm and comfortable  Respiratory system: Clear to auscultation.  No increased work of breathing, diminished lung sounds on left base Cardiovascular system: Regular rate and rhythm, no murmurs Gastrointestinal system: Soft, nondistended, no rebound or guarding, plus bowel sounds Central nervous system: Alert  and oriented.  Grossly intact, moving all extremities Extremities: No lower extremity edema Skin: Pleurx site in the left thorax clean dry and intact Psychiatry: Judgement and insight appear normal. Mood & affect appropriate.     The results of significant diagnostics from this hospitalization (including imaging, microbiology, ancillary and laboratory) are listed below for reference.     Microbiology: No results found for this or any previous visit (from the past 240 hour(s)).   Labs: BNP (last 3 results) No results for input(s): BNP in the last 8760 hours. Basic Metabolic Panel: Recent Labs  Lab 01/06/19 1253 01/07/19 0212 01/08/19 0340  NA 139 139 138  K 3.7 3.7 4.0  CL 103 106 106  CO2 '25 24 25  ' GLUCOSE 142* 131* 102*  BUN '12 13 10  ' CREATININE 0.64 0.75 0.61  CALCIUM 9.3 8.6* 8.6*  MG  --   --  2.2   Liver Function Tests: Recent Labs  Lab 01/06/19 1253 01/07/19 0212  AST 27 17  ALT 38 26  ALKPHOS 81 67  BILITOT 1.6* 0.9  PROT 7.1 5.5*  ALBUMIN 4.0 3.0*   No results for input(s): LIPASE, AMYLASE in the last 168 hours. No results for input(s): AMMONIA in the last 168 hours. CBC: Recent Labs  Lab 01/06/19 1253 01/07/19 0212 01/08/19 0340  WBC 12.5* 10.8* 8.5   NEUTROABS 11.5*  --   --   HGB 14.1 12.9 11.7*  HCT 41.8 38.4 37.2  MCV 92.7 92.3 97.1  PLT 215 190 172   Cardiac Enzymes: No results for input(s): CKTOTAL, CKMB, CKMBINDEX, TROPONINI in the last 168 hours. BNP: Invalid input(s): POCBNP CBG: No results for input(s): GLUCAP in the last 168 hours. D-Dimer No results for input(s): DDIMER in the last 72 hours. Hgb A1c No results for input(s): HGBA1C in the last 72 hours. Lipid Profile No results for input(s): CHOL, HDL, LDLCALC, TRIG, CHOLHDL, LDLDIRECT in the last 72 hours. Thyroid function studies No results for input(s): TSH, T4TOTAL, T3FREE, THYROIDAB in the last 72 hours.  Invalid input(s): FREET3 Anemia work up No results for input(s): VITAMINB12, FOLATE, FERRITIN, TIBC, IRON, RETICCTPCT in the last 72 hours. Urinalysis    Component Value Date/Time   COLORURINE YELLOW 05/24/2017 1450   APPEARANCEUR CLEAR 05/24/2017 1450   LABSPEC 1.010 05/24/2017 1450   PHURINE 7.0 05/24/2017 1450   GLUCOSEU NEGATIVE 05/24/2017 1450   HGBUR NEGATIVE 05/24/2017 1450   BILIRUBINUR NEGATIVE 05/24/2017 1450   KETONESUR NEGATIVE 05/24/2017 1450   PROTEINUR NEGATIVE 05/24/2017 1450   NITRITE NEGATIVE 05/24/2017 1450   LEUKOCYTESUR NEGATIVE 05/24/2017 1450   Sepsis Labs Invalid input(s): PROCALCITONIN,  WBC,  LACTICIDVEN Microbiology No results found for this or any previous visit (from the past 240 hour(s)).   Time coordinating discharge: 35  SIGNED:   Cristy Folks, MD  [facility] 01/08/2019, 10:59 AM   If 7PM-7AM, please contact night-coverage [email or web address] Password [email or web address]

## 2019-01-08 NOTE — TOC Transition Note (Addendum)
Transition of Care [facility]) - CM/SW Discharge Note   Patient Details  Name: Tina Cruz MRN: [id number] Date of Birth: [birth year]  Transition of Care [facility] CM/SW Contact:  Erenest Rasher, RN Phone Number: 01/08/2019, 10:34 AM   Clinical Narrative:    Received confirmation from [facility] that fax was received for [facility]. Provided pt with Eliquis 30 day free trial card and copay card. Contacted pt's pharmacy and they do not have Eliquis starter pack in stock. [facility] on [address] [facility] starter pack for Eliquis [phone]. Will make attending aware.    Spoke to attending, Dr Herbert Moors, Called in [facility] [address]. Updated pt on pharmacy to go and pick up her Rx.   Per Unit RN, pt up ambulating with oxygen sats 91-93% on RA.   Faxed dc summary to [facility].   Final next level of care: [place] Barriers to Discharge: No Barriers Identified   Patient Goals and CMS Choice Patient states their goals for this hospitalization and ongoing recovery are:: breath better CMS Medicare.gov Compare Post Acute Care list provided to:: Patient Choice offered to / list presented to : Patient  Discharge Placement                       Discharge Plan and Services In-house Referral: NA Discharge Planning Services: CM Consult Post Acute Care Choice: Home Health          DME Arranged: Other see comment(Pleurx Drain [employer]) DME Agency: Other - Comment([facility]) HH Arranged: RN, PT, OT HH Agency: Other - See comment   Social Determinants of Health (SDOH) Interventions     Readmission Risk Interventions No flowsheet data found.

## 2019-01-08 NOTE — Progress Notes (Addendum)
[place] for heparin Indication: pulmonary embolus  Allergies  Allergen Reactions  . Albuterol Hives and Itching    Patient Measurements: Height: 5\' 7"  (170.2 cm) Weight: 159 lb (72.1 kg) IBW/kg (Calculated) : 61.6 Heparin Dosing Weight: 77.6  Vital Signs: Temp: 97.8 F (36.6 C) (03/29 0300) Temp Source: Oral (03/29 0300) BP: 112/75 (03/29 0300) Pulse Rate: 67 (03/29 0300)  Labs: Recent Labs    01/06/19 1253  01/07/19 0212 01/07/19 0904 01/07/19 1651 01/08/19 0340  HGB 14.1  --  12.9  --   --  11.7*  HCT 41.8  --  38.4  --   --  37.2  PLT 215  --  190  --   --  172  LABPROT 13.2  --   --   --   --   --   INR 1.0  --   --   --   --   --   HEPARINUNFRC  --    < > 0.42 0.33 0.64 0.79*  CREATININE 0.64  --  0.75  --   --  0.61   < > = values in this interval not displayed.    Estimated Creatinine Clearance: 75.4 mL/min (by C-G formula based on SCr of 0.61 mg/dL).   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 IR placed a pleurex catheter in her L pleural space in AM prior to admission but when SOB not resolved, CTA done that revealed multiple R PEs with associated RH strain   Baseline INR WNL, aPTT: not done  Prior anticoagulation: none  Significant events:  Today, 01/08/2019:  CBC: counts lower but remains WNL  Most recent heparin level supratherapeutic this AM on 1450 units/hr; previously had been on high end of range  No SOB, CP, or hemoptysis; BP wnl, no longer tachy, satting well on RA  No bleeding or infusion issues per nursing (PleurX site looks good)  Plan is to discharge on Eliquis later today, although no orders entered yet this morning  Goal of Therapy: Heparin level 0.5-0.7 units/ml (given multiple PE w/ RH strain) Monitor platelets by anticoagulation protocol: Yes  Plan:  Decrease heparin IV infusion to 1350  units/hr; still targeting higher end of range given severity of disease  Check heparin level 6 hrs after rate change  Daily CBC, daily heparin level once stable  Monitor for signs of bleeding or thrombosis  Pharmacy to provide [place] education prior to discharge if needed  Reuel Boom, PharmD, BCPS [phone] 01/08/2019, 8:30 AM

## 2019-01-09 ENCOUNTER — Encounter: Payer: Self-pay | Admitting: Internal Medicine

## 2019-01-09 ENCOUNTER — Other Ambulatory Visit: Payer: Self-pay | Admitting: Radiation Therapy

## 2019-01-09 ENCOUNTER — Ambulatory Visit
Admission: RE | Admit: 2019-01-09 | Discharge: 2019-01-09 | Disposition: A | Discharge status: Home / Self Care | Payer: Managed Care, Other (non HMO) | Source: Ambulatory Visit | Attending: Radiation Oncology | Admitting: Radiation Oncology

## 2019-01-09 ENCOUNTER — Telehealth: Payer: Self-pay | Admitting: Pharmacist

## 2019-01-09 ENCOUNTER — Inpatient Hospital Stay: Payer: Managed Care, Other (non HMO)

## 2019-01-09 ENCOUNTER — Telehealth: Payer: Self-pay

## 2019-01-09 ENCOUNTER — Ambulatory Visit: Payer: Managed Care, Other (non HMO)

## 2019-01-09 ENCOUNTER — Inpatient Hospital Stay (HOSPITAL_BASED_OUTPATIENT_CLINIC_OR_DEPARTMENT_OTHER): Payer: Managed Care, Other (non HMO) | Admitting: Internal Medicine

## 2019-01-09 ENCOUNTER — Other Ambulatory Visit: Payer: Self-pay

## 2019-01-09 VITALS — BP 133/70 | HR 91 | Temp 98.1°F | Resp 18 | Ht 67.0 in | Wt 169.9 lb

## 2019-01-09 DIAGNOSIS — Z5111 Encounter for antineoplastic chemotherapy: Secondary | ICD-10-CM | POA: Insufficient documentation

## 2019-01-09 DIAGNOSIS — Z79891 Long term (current) use of opiate analgesic: Secondary | ICD-10-CM | POA: Diagnosis not present

## 2019-01-09 DIAGNOSIS — Z79899 Other long term (current) drug therapy: Secondary | ICD-10-CM | POA: Diagnosis not present

## 2019-01-09 DIAGNOSIS — Z79890 Hormone replacement therapy (postmenopausal): Secondary | ICD-10-CM | POA: Diagnosis not present

## 2019-01-09 DIAGNOSIS — Z7189 Other specified counseling: Secondary | ICD-10-CM

## 2019-01-09 DIAGNOSIS — Z7952 Long term (current) use of systemic steroids: Secondary | ICD-10-CM | POA: Diagnosis not present

## 2019-01-09 DIAGNOSIS — C3492 Malignant neoplasm of unspecified part of left bronchus or lung: Secondary | ICD-10-CM | POA: Diagnosis not present

## 2019-01-09 DIAGNOSIS — R0902 Hypoxemia: Secondary | ICD-10-CM | POA: Diagnosis not present

## 2019-01-09 DIAGNOSIS — C7931 Secondary malignant neoplasm of brain: Secondary | ICD-10-CM

## 2019-01-09 DIAGNOSIS — J910 Malignant pleural effusion: Secondary | ICD-10-CM | POA: Diagnosis not present

## 2019-01-09 DIAGNOSIS — Z510 Encounter for antineoplastic radiation therapy: Secondary | ICD-10-CM | POA: Diagnosis not present

## 2019-01-09 DIAGNOSIS — C7971 Secondary malignant neoplasm of right adrenal gland: Secondary | ICD-10-CM | POA: Diagnosis not present

## 2019-01-09 DIAGNOSIS — C7951 Secondary malignant neoplasm of bone: Secondary | ICD-10-CM

## 2019-01-09 DIAGNOSIS — M545 Low back pain: Secondary | ICD-10-CM | POA: Diagnosis not present

## 2019-01-09 DIAGNOSIS — R590 Localized enlarged lymph nodes: Secondary | ICD-10-CM | POA: Diagnosis not present

## 2019-01-09 DIAGNOSIS — K5900 Constipation, unspecified: Secondary | ICD-10-CM | POA: Diagnosis not present

## 2019-01-09 DIAGNOSIS — C782 Secondary malignant neoplasm of pleura: Secondary | ICD-10-CM | POA: Diagnosis not present

## 2019-01-09 DIAGNOSIS — J189 Pneumonia, unspecified organism: Secondary | ICD-10-CM | POA: Diagnosis not present

## 2019-01-09 DIAGNOSIS — Z87891 Personal history of nicotine dependence: Secondary | ICD-10-CM | POA: Diagnosis not present

## 2019-01-09 DIAGNOSIS — C7949 Secondary malignant neoplasm of other parts of nervous system: Principal | ICD-10-CM

## 2019-01-09 DIAGNOSIS — Z800 Family history of malignant neoplasm of digestive organs: Secondary | ICD-10-CM | POA: Diagnosis not present

## 2019-01-09 DIAGNOSIS — E039 Hypothyroidism, unspecified: Secondary | ICD-10-CM | POA: Diagnosis not present

## 2019-01-09 LAB — CBC WITH DIFFERENTIAL (CANCER CENTER ONLY)
Abs Immature Granulocytes: 0.09 10*3/uL — ABNORMAL HIGH (ref 0.00–0.07)
Basophils Absolute: 0 10*3/uL (ref 0.0–0.1)
Basophils Relative: 0 %
EOS PCT: 1 %
Eosinophils Absolute: 0.1 10*3/uL (ref 0.0–0.5)
HCT: 42.3 % (ref 36.0–46.0)
Hemoglobin: 14.2 g/dL (ref 12.0–15.0)
Immature Granulocytes: 1 %
Lymphocytes Relative: 2 %
Lymphs Abs: 0.2 10*3/uL — ABNORMAL LOW (ref 0.7–4.0)
MCH: 30.8 pg (ref 26.0–34.0)
MCHC: 33.6 g/dL (ref 30.0–36.0)
MCV: 91.8 fL (ref 80.0–100.0)
Monocytes Absolute: 0.7 10*3/uL (ref 0.1–1.0)
Monocytes Relative: 6 %
Neutro Abs: 10.2 10*3/uL — ABNORMAL HIGH (ref 1.7–7.7)
Neutrophils Relative %: 90 %
Platelet Count: 182 10*3/uL (ref 150–400)
RBC: 4.61 MIL/uL (ref 3.87–5.11)
RDW: 13.6 % (ref 11.5–15.5)
WBC Count: 11.2 10*3/uL — ABNORMAL HIGH (ref 4.0–10.5)
nRBC: 0 % (ref 0.0–0.2)

## 2019-01-09 LAB — CMP (CANCER CENTER ONLY)
ALT: 26 U/L (ref 0–44)
ANION GAP: 9 (ref 5–15)
AST: 16 U/L (ref 15–41)
Albumin: 3.4 g/dL — ABNORMAL LOW (ref 3.5–5.0)
Alkaline Phosphatase: 77 U/L (ref 38–126)
BUN: 11 mg/dL (ref 6–20)
CO2: 26 mmol/L (ref 22–32)
Calcium: 9.4 mg/dL (ref 8.9–10.3)
Chloride: 103 mmol/L (ref 98–111)
Creatinine: 0.8 mg/dL (ref 0.44–1.00)
GFR, Estimated: >60 mL/min (ref >60)
Glucose, Bld: 122 mg/dL — ABNORMAL HIGH (ref 70–99)
Potassium: 3.7 mmol/L (ref 3.5–5.1)
Sodium: 138 mmol/L (ref 135–145)
Total Bilirubin: 1.4 mg/dL — ABNORMAL HIGH (ref 0.3–1.2)
Total Protein: 6.5 g/dL (ref 6.5–8.1)

## 2019-01-09 MED ORDER — OSIMERTINIB MESYLATE 80 MG PO TABS: 80.0000 mg | ORAL_TABLET | Freq: Every day | ORAL | 2 refills | Status: DC

## 2019-01-09 NOTE — Telephone Encounter (Addendum)
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  Original diagnosis in Feb 2020, not to have metastasis to the brain. Patient received palliative radiation to upper lung mass due to lung collapse and post-obstructive pneumonia, this was performed 12/21/18-01/11/19 Patient is also under consideration for stereotactic radiotherapy to brain metastasis. Noted patient with recent diagnosis of pulmonary embolism, start on apixaban.  Patient is now under evaluation to being systemic treatment with Tagrisso at 80mg by mouth once daily.  Labs from 01/09/2019 assessed, OK for treatment initiation. Noted Tbili elevated to 1.4 (just slight above normal limits) No dose adjustment per manufacturer, will continue to be monitored. 01/09/19 EKG shows QTc at 427 msec, safe for Tagrisso initiation  Current medication list in Epic reviewed, no significant DDIs with Tagrisso identified.  Prescription will be e-scribed to the [facility] for benefits analysis and approval.  Oral Oncology Clinic will continue to follow for insurance authorization, copayment issues, initial counseling and start date.  Jesse Mack, PharmD, BCPS, BCOP  01/09/2019 1:10 PM Oral Oncology Clinic [phone]  

## 2019-01-09 NOTE — Telephone Encounter (Signed)
Oral Oncology Patient Advocate Encounter  Received notification from Christella Scheuermann that prior authorization for Tagrisso is required.   I called [phone] to initiate the PA Ticket #[id number] Status is pending  Oral Oncology Clinic will continue to follow.  [place] [facility] [place] Phone [phone] Fax [phone] 01/09/2019    3:32 PM

## 2019-01-09 NOTE — Telephone Encounter (Signed)
Mrs Nipper called [facility] office this Am, stating that she went to [facility] Friday due to increased shortness of breath. IR placed a left pleurX catheter. She was discharged home on 01/08/19 with a box of pleurX supplies and told to drain every other day.  The CM/SW faxed request to [facility] for a home health referral/( They are a 3rd party insurance group that finds home health agency in the area.) I did reach out to them this am, they did have the order for Mrs Betten but no home health had been arranged as of 01/09/19. I sent a referral to [facility] to drain Left PleurX every other day and teach patient or family member how to manage. The referral was noted to be Urgent.   I told Mrs Huntley that [facility] will contact her today or tomorrow to schedule a time to come out to her home. She states that she has not hand any problems with her breathing since hospital discharge. She was instructed to call our office if she had not heard from [facility] soon. Mrs Fluty was told she could come to our office to have the PleurX drained, If no one called her by the end of the day but was cautioned only to come if needed due to COVID-19 precautions that are in place by the state.

## 2019-01-09 NOTE — Progress Notes (Signed)
[place] [phone]   [phone]) [phone]  OFFICE PROGRESS NOTE  [name], MD [address] [place] [address]  DIAGNOSIS: stage IV (T2b, N3, M1 C) non-small cell lung cancer, adenocarcinoma presented with left suprahilar mass with postobstructive collapse of the left upper lobe in addition to mediastinal and left supraclavicular lymphadenopathy as well as malignant left pleural effusion with pleural-based metastasis and metastatic disease to the bone, right adrenal gland and brain diagnosed in February 2020. Molecular studies: POSITIVE for the Exon 21 L861Q mutation  POSITIVE for the Exon 18 G719X mutation  NEGATIVE for the Exon 20 T790M mutation   PRIOR THERAPY: 1) palliative radiotherapy to the brain as well as the metastatic bone disease and the obstructive left suprahilar mass. 2) left Pleurx catheter placement under the care of Dr. Prescott Gum.  CURRENT THERAPY: Tagrisso 80 mg p.o. daily.  She is expected to start the first dose of this treatment in the next few days.  INTERVAL HISTORY: Tina Cruz 58 y.o. female returns to the clinic today for follow-up visit.  The patient is feeling fine today with no concerning complaints except for mild shortness of breath and pain on the left side of the chest.  She had a Pleurx catheter placed by Dr. Prescott Gum recently.  She is currently on oxycodone for her pain management.  She completed a course of palliative radiotherapy to the metastatic disease in the brain as well as bone and obstructive left suprahilar mass under the care of Dr. Sondra Come and Isidore Moos.  She denied having any recent weight loss or night sweats.  She has no nausea, vomiting, diarrhea or constipation.  She has no headache or visual changes.  The patient had molecular studies performed by [facility] as well as [facility] that showed positive EGFR mutation in exon 21 as well as exon 18.  She is here today for evaluation and  discussion of her treatment options.  MEDICAL HISTORY: Past Medical History:  Diagnosis Date   Active smoker    quit 2016   Cancer [facility])    left lung stage IV non-cell carcinoma/adenocarcinoma with diagnosis made from left pleural effusion.     Cancer ([place])    Brain metastasis    Constipation    Depression    Dyspnea    GERD (gastroesophageal reflux disease)    H/O chronic bronchitis    Hypothyroidism    Seasonal allergies    SVD (spontaneous vaginal delivery)    x 2   Thyroid disease    hypothyroid   Wears partial dentures     ALLERGIES:  is allergic to albuterol.  MEDICATIONS:  Current Outpatient Medications  Medication Sig Dispense Refill   acetaminophen (TYLENOL) 325 MG tablet Take 325 mg by mouth every 6 (six) hours as needed for mild pain or headache.     apixaban (ELIQUIS) 5 MG TABS tablet Take 5-10 mg by mouth 2 (two) times daily. Take two tablets (10 mg) by mouth twice daily for SEVEN days, then take one tablet by mouth twice daily afterward     azelastine (ASTELIN) 0.1 % nasal spray Place 1 spray into both nostrils 2 (two) times daily as needed for rhinitis. Use in each nostril as directed 30 mL 12   Cetirizine HCl (ZYRTEC ALLERGY) 10 MG CAPS Take 1 capsule (10 mg total) by mouth daily as needed (allergies). 30 capsule 11   dexamethasone (DECADRON) 4 MG tablet Take 1  tablet (4 mg total) by mouth 2 (two) times daily. (Patient taking differently: Take 2 mg by mouth daily. ) 30 tablet 1   famotidine (PEPCID) 20 MG tablet Take 20 mg by mouth daily as needed for heartburn.      ibuprofen (ADVIL) 200 MG tablet Take 200 mg by mouth every 8 (eight) hours as needed for moderate pain.      levalbuterol (XOPENEX HFA) 45 MCG/ACT inhaler Inhale 1-2 puffs into the lungs every 4 (four) hours as needed for wheezing or shortness of breath. 1 Inhaler 0   levothyroxine (SYNTHROID, LEVOTHROID) 112 MCG tablet TAKE 1 TABLET (112 MCG TOTAL) BY MOUTH DAILY BEFORE  BREAKFAST. 90 tablet 1   LORazepam (ATIVAN) 1 MG tablet Take 1 tablet about 30 min before wearing mask for brain treatment. Must have a driver after taking this. Take 1 tab PRN before MRIs in the future, if feeling anxious. 3 tablet 0   magnesium citrate SOLN Take 0.5 Bottles by mouth daily as needed for moderate constipation or severe constipation.     polyethylene glycol (MIRALAX / GLYCOLAX) packet Take 17 g by mouth daily.      No current facility-administered medications for this visit.     SURGICAL HISTORY:  Past Surgical History:  Procedure Laterality Date   COLONOSCOPY     x 2   IR PERC PLEURAL DRAIN W/INDWELL CATH W/IMG GUIDE  01/06/2019   TONSILLECTOMY     TUBAL LIGATION      REVIEW OF SYSTEMS:  Constitutional: positive for fatigue Eyes: negative Ears, nose, mouth, throat, and face: negative Respiratory: positive for dyspnea on exertion and pleurisy/chest pain Cardiovascular: negative Gastrointestinal: negative Genitourinary:negative Integument/breast: negative Hematologic/lymphatic: negative Musculoskeletal:negative Neurological: negative Behavioral/Psych: negative Endocrine: negative Allergic/Immunologic: negative   PHYSICAL EXAMINATION: General appearance: alert, cooperative, fatigued and no distress Head: Normocephalic, without obvious abnormality, atraumatic Neck: no adenopathy, no JVD, supple, symmetrical, trachea midline and thyroid not enlarged, symmetric, no tenderness/mass/nodules Lymph nodes: Cervical, supraclavicular, and axillary nodes normal. Resp: diminished breath sounds LLL and dullness to percussion LLL Back: symmetric, no curvature. ROM normal. No CVA tenderness. Cardio: regular rate and rhythm, S1, S2 normal, no murmur, click, rub or gallop GI: soft, non-tender; bowel sounds normal; no masses,  no organomegaly Extremities: extremities normal, atraumatic, no cyanosis or edema Neurologic: Alert and oriented X 3, normal strength and tone.  Normal symmetric reflexes. Normal coordination and gait  ECOG PERFORMANCE STATUS: 1 - Symptomatic but completely ambulatory  Blood pressure 133/70, pulse 91, temperature 98.1 F (36.7 C), temperature source Oral, resp. rate 18, height '5\' 7"'  (1.702 m), weight 169 lb 14.4 oz (77.1 kg), SpO2 97 %.  LABORATORY DATA: Lab Results  Component Value Date   WBC 11.2 (H) 01/09/2019   HGB 14.2 01/09/2019   HCT 42.3 01/09/2019   MCV 91.8 01/09/2019   PLT 182 01/09/2019      Chemistry      Component Value Date/Time   NA 138 01/09/2019 1106   K 3.7 01/09/2019 1106   CL 103 01/09/2019 1106   CO2 26 01/09/2019 1106   BUN 11 01/09/2019 1106   CREATININE 0.80 01/09/2019 1106   CREATININE 0.84 11/04/2018 0938      Component Value Date/Time   CALCIUM 9.4 01/09/2019 1106   ALKPHOS 77 01/09/2019 1106   AST 16 01/09/2019 1106   ALT 26 01/09/2019 1106   BILITOT 1.4 (H) 01/09/2019 1106       RADIOGRAPHIC STUDIES: Dg Chest 2 View  Result Date: 01/04/2019  CLINICAL DATA:  Malignant pleural effusion. EXAM: CHEST - 2 VIEW COMPARISON:  12/12/2018 FINDINGS: Increased densities throughout the left chest compatible with a large left pleural effusion. There appears to be mediastinal shift towards the right due to the large effusion. Left pleural effusion has enlarged from the prior imaging and there is a small amount of residual aeration in the left upper lung. Right lung is clear. No acute bone abnormality. Left side of the heart is obscured by the large left pleural effusion. IMPRESSION: Large left pleural effusion. Pleural effusion has enlarged since the PET-CT on 12/12/2018. Right mediastinal shift due to the large left pleural effusion. Electronically Signed   By: Markus Daft M.D.   On: 01/04/2019 12:27   Ct Angio Chest Pe W And/or Wo Contrast  Result Date: 01/06/2019 CLINICAL DATA:  Dyspnea with stage IV lung cancer.  Hypoxia. EXAM: CT ANGIOGRAPHY CHEST WITH CONTRAST TECHNIQUE: Multidetector CT imaging  of the chest was performed using the standard protocol during bolus administration of intravenous contrast. Multiplanar CT image reconstructions and MIPs were obtained to evaluate the vascular anatomy. CONTRAST:  140m OMNIPAQUE IOHEXOL 350 MG/ML SOLN COMPARISON:  Head CT 12/12/2018 and chest CT 11/11/2018 FINDINGS: Cardiovascular: Conventional branch pattern of the great vessels. Nonaneurysmal thoracic aorta. Acute pulmonary emboli noted on the right starting from the bifurcation of the right main pulmonary artery extending into the right upper and lower lobe lobar and segmental branches. Right heart strain is identified with RV/LV ratio 1.2. Mediastinum/Nodes: Small subcentimeter mediastinal lymph nodes with previously described metabolically active left-sided supraclavicular lymph nodes, the largest approximately 9 mm. No thyromegaly or mass. Patent trachea and mainstem bronchi with slight luminal narrowing of the distal left upper lobe bronchi from known mass and consolidation. Lungs/Pleura: Redemonstration of masslike opacity and postobstructive consolidation of the medial left upper lobe. The margins of the mass are difficult to identified due to the adjacent abutting pulmonary consolidations likely representing postobstructive change. The degree of postobstructive consolidation appears slightly increased since prior. Moderate to large left pleural effusion with adjacent left basilar compressive atelectasis with atelectasis along the left major fissure is identified. The volume of fluid in the left hemithorax has increased slightly from the November 11, 2018 exam though is decreased in appearance relative to the more recent prior PET-CT from 12/12/2018. The tip of a Pleurx catheter terminates at the posterior fourth rib level. Patchy airspace disease in the left upper lobe, lingula and left lower lobe can not exclude superimposed pneumonia. Upper Abdomen: No acute abnormality. Musculoskeletal: Redemonstration  of scattered osseous metastasis, at the base of the left glenoid, midthoracic spine and ribs better visualized on prior PET-CT. Review of the MIP images confirms the above findings. IMPRESSION: 1. Acute multilobar right-sided pulmonary emboli to the right upper and lower lobes with right heart strain, RV/LV ratio 1.2. 2. New patchy airspace opacities within the aerated left upper lobe, lingula and left lower lobe. Pneumonia is not excluded. 3. Redemonstration of left upper lobe mass with postobstructive consolidation and moderate to large left pleural effusion. PleurX catheter is noted within effusion. 4. Osseous metastatic disease to the dorsal spine, left scapula and ribs. These results were called by telephone at the time of interpretation on 01/06/2019 at 7:11 pm to Dr. JNanda Quinton, who verbally acknowledged these results. Electronically Signed   By: DAshley RoyaltyM.D.   On: 01/06/2019 19:14   Mr BJeri CosWASContrast  Result Date: 12/25/2018 CLINICAL DATA:  S RS brain study. Treatment planning  for new diagnosis of non-small cell lung cancer. Right parietal metastasis shown on previous exam. Calvarial metastases shown on previous exam. Left frontal metastasis on previous exam. EXAM: MRI HEAD WITHOUT AND WITH CONTRAST TECHNIQUE: Multiplanar, multiecho pulse sequences of the brain and surrounding structures were obtained without and with intravenous contrast. CONTRAST:  29m MULTIHANCE GADOBENATE DIMEGLUMINE 529 MG/ML IV SOLN COMPARISON:  12/07/2018 FINDINGS: Brain: The brainstem and cerebellum are normal. Cerebral hemispheres show minimal small vessel change of the white matter without significant ischemic changes. There is a 13 x 14 mm metastasis in what probably represents the precentral gyrus of the posterior frontal lobe. There is moderate surrounding edema. There is a second probable metastasis in the left frontal lobe, axial image 92, measuring 2-3 mm. There is an adjacent vessel, but I think this is  probably a small metastasis. No third lesion is identified. No evidence of hydrocephalus or extra-axial collection. Vascular: Major vessels at the base of the brain show flow. Skull and upper cervical spine: Multiple calvarial metastases as noted by arrows. Sinuses/Orbits: Clear/normal Other: None IMPRESSION: 13 x 14 mm metastasis with surrounding vasogenic edema in the posterior right frontal lobe axial image 117. Probable 2-3 mm metastasis in the left frontal lobe axial image 92. Multiple calvarial metastases as marked by arrows. Electronically Signed   By: MNelson ChimesM.D.   On: 12/25/2018 14:36   Nm Pet Image Initial (pi) Skull Base To Thigh  Result Date: 12/12/2018 CLINICAL DATA:  Initial treatment strategy for LEFT upper lobe mass EXAM: NUCLEAR MEDICINE PET SKULL BASE TO THIGH TECHNIQUE: Nine point mCi F-18 FDG was injected intravenously. Full-ring PET imaging was performed from the skull base to thigh after the radiotracer. CT data was obtained and used for attenuation correction and anatomic localization. Fasting blood glucose: 94 mg/dl COMPARISON:  CT 11/11/2018 FINDINGS: Mediastinal blood pool activity: SUV max 2.5 NECK: Hypermetabolic LEFT supraclavicular node with SUV max equal 7.4 measures 10 mm short axis (image 55/4) Incidental CT findings: None CHEST: A partially calcified hypermetabolic LEFT suprahilar mass surrounds the LEFT upper lobe bronchus. Mass is intensely hypermetabolic with SUV max equal 12.9. Mass measures approximately 3.5 x 2.5 cm. Post obstructive segmental collapse of the LEFT upper lobe. There is hypermetabolic nodal extension to the mediastinum with a hypermetabolic AP window node which difficult define on the CT portion but evident on the PET data. Along the most inferior aspect of the pleural surface of the LEFT hemithorax there is hypermetabolic rim of metabolic activity (image 94 for example). Large LEFT effusion. Incidental CT findings: Large LEFT pleural effusion  ABDOMEN/PELVIS: Focal metabolic activity of the RIGHT adrenal gland with SUV max equal 6.3. Minimal enlargement the body to 8 mm. LEFT adrenal gland appears normal. No abnormal activity in liver. No hypermetabolic abdominopelvic lymph nodes. Incidental CT findings: none SKELETON: Multiple foci of intense focal metabolic activity within the spine, sacrum pelvis and including the RIGHT femur and LEFT scapula. Example lesion in the transverse process of the T2 vertebral body on the LEFT with SUV max equal 11.9. There is destructive lesion at this level. Example lesion in the LEFT acetabulum with SUV max equal 15.0. Subtle lucent lesion. Example lesion in the L 3 vertebral body with SUV max equal 17.9. Subtle lytic lesion measuring 2 cm this level. Incidental CT findings: No pathologic fracture IMPRESSION: 1. Intensely hypermetabolic LEFT suprahilar mass consistent primary bronchogenic carcinoma. Post obstructive collapse of the LEFT upper lobe. 2. Evidence of hypermetabolic nodal metastasis including the AP  window and LEFT supraclavicular node. LEFT supraclavicular node may be amenable to biopsy. 3. Evidence of pleural metastasis in the LEFT hemithorax. Large LEFT pleural effusion. 4. Multiple foci of hypermetabolic lytic skeletal metastasis in the spine, scapula, pelvis, and RIGHT femur. 5. High concern for small RIGHT adrenal metastasis. Electronically Signed   By: Suzy Bouchard M.D.   On: 12/12/2018 10:15   Dg Chest Port 1 View  Result Date: 01/06/2019 CLINICAL DATA:  Status post left PleurX catheter placement for malignant pleural effusion secondary to adenocarcinoma of the lung. EXAM: PORTABLE CHEST 1 VIEW COMPARISON:  Film earlier today at 1228 hours FINDINGS: Left basilar PleurX catheter present. After fluid removal, there is reduction in amount of left pleural fluid volume with improved aeration of the left lung. Small amount of pleural fluid remains. No pneumothorax. Right lung remains clear.  IMPRESSION: Decrease in left pleural fluid volume after placement of PleurX catheter and drainage of pleural fluid. No pneumothorax. Electronically Signed   By: Aletta Edouard M.D.   On: 01/06/2019 16:25   Dg Chest Portable 1 View  Result Date: 01/06/2019 CLINICAL DATA:  Shortness of breath, pleural effusion EXAM: PORTABLE CHEST 1 VIEW COMPARISON:  01/04/2019 FINDINGS: Large left pleural effusion unchanged with 01/04/2019. No right pleural effusion. No pneumothorax. Stable cardiomediastinal silhouette. No acute osseous abnormality. IMPRESSION: 1. Stable large left pleural effusion. Electronically Signed   By: Kathreen Devoid   On: 01/06/2019 12:49   Ir Perc Pleural Drain Genia Harold Cath W/img Guide  Result Date: 01/06/2019 INDICATION: 58 year old with lung cancer and malignant left pleural effusion. Patient was scheduled for a tunneled pleural catheter by Thoracic Surgery today at [facility]. Patient was at [facility] for radiation therapy but became short of breath and ended up in the Emergency Department. Interventional Radiology was consulted for placement of the tunneled pleural catheter. EXAM: PLACEMENT OF TUNNELED LEFT PLEURAL CATHETER WITH ULTRASOUND AND FLUOROSCOPIC GUIDANCE MEDICATIONS: Ancef 2 g ANESTHESIA/SEDATION: Fentanyl 50 mcg IV; Versed 1 mg IV Moderate Sedation Time:  24 minutes The patient was continuously monitored during the procedure by the interventional radiology nurse under my direct supervision. FLUOROSCOPY TIME:  30 seconds, 15 mGy COMPLICATIONS: None immediate. PROCEDURE: Informed written consent was obtained from the patient after a thorough discussion of the procedural risks, benefits and alternatives. All questions were addressed. Maximal Sterile Barrier Technique was utilized including caps, mask, sterile gowns, sterile gloves, sterile drape, hand hygiene and skin antiseptic. A timeout was performed prior to the initiation of the procedure. Ultrasound was used  to identify a large amount of left pleural fluid. The left mid axillary line and left anterior lower chest area was prepped and draped in sterile fashion. Skin was anesthetized and 2 small incisions were made. 15.5 [place] PleurX catheter was tunneled between the incisions and the cuff was placed underneath the skin. A Yueh catheter was directed into the pleural space along the more lateral incision with ultrasound guidance. Yellow fluid was aspirated. Wire was advanced into the pleural space and a peel-away sheath was placed. Catheter was directed through the peel-away sheath into the pleural space. 2 L of yellow pleural fluid was removed. Pleural entrance skin site was closed using absorbable suture and Dermabond. Catheter was sutured to skin with Prolene suture. Fluoroscopic and ultrasound images were taken and saved for documentation. IMPRESSION: Successful placement of a tunneled left pleural catheter with ultrasound and fluoroscopic guidance. Electronically Signed   By: Markus Daft M.D.   On: 01/06/2019 16:07  ASSESSMENT AND PLAN: This is a very pleasant 58 years old white female with a stage IV non-small cell lung cancer, adenocarcinoma with positive EGFR mutation in exon 21 ( L861Q) mutation as well as  Exon 15 G719X mutation  The patient is status post stereotactic radiotherapy to metastatic brain lesion as well as palliative radiotherapy to the left lung mass and bone metastasis. She also had left Pleurx catheter placed recently. I had a lengthy discussion with the patient today about her current disease stage, prognosis and treatment options.  The patient has positive for EGFR mutation in exon 21 as well as exon 18.  These are some of the uncommon mutation but respond very well to EGFR tyrosine kinase inhibitor. I recommended for the patient treatment with Tagrisso 80 mg p.o. daily.  I discussed with the patient the adverse effect of this treatment including but not limited to skin rash, diarrhea,  interstitial lung disease, liver, renal or cardiac dysfunction. We will arrange for the patient to have a EKG today. The patient will have evaluation by the pharmacist for oral oncolytic for more discussion of her oral medication as well as teaching about the adverse effects. She is expected to start this treatment in the next few days after receiving her insurance coverage. I will see the patient back for follow-up visit in 3 weeks for evaluation and repeat blood work and also for management of any adverse effect of her treatment. The patient was advised to call immediately if she has any concerning symptoms in the interval. The patient voices understanding of current disease status and treatment options and is in agreement with the current care plan.  All questions were answered. The patient knows to call the clinic with any problems, questions or concerns. We can certainly see the patient much sooner if necessary.  I spent 15 minutes counseling the patient face to face. The total time spent in the appointment was 25 minutes.  Disclaimer: This note was dictated with voice recognition software. Similar sounding words can inadvertently be transcribed and may not be corrected upon review.

## 2019-01-09 NOTE — Telephone Encounter (Signed)
Oral Oncology Patient Advocate Encounter  Prior Authorization for Tina Cruz has been approved.    PA# [id number] Effective dates: 01/09/19 through 01/09/20  Oral Oncology Clinic will continue to follow.   [place] [facility] [place] Phone [phone] Fax [phone] 01/09/2019    3:32 PM

## 2019-01-10 ENCOUNTER — Ambulatory Visit
Admission: RE | Admit: 2019-01-10 | Discharge: 2019-01-10 | Disposition: A | Discharge status: Home / Self Care | Payer: Managed Care, Other (non HMO) | Source: Ambulatory Visit | Attending: Radiation Oncology | Admitting: Radiation Oncology

## 2019-01-10 ENCOUNTER — Ambulatory Visit: Payer: Managed Care, Other (non HMO)

## 2019-01-10 ENCOUNTER — Other Ambulatory Visit: Payer: Self-pay | Admitting: Radiation Oncology

## 2019-01-10 ENCOUNTER — Telehealth: Payer: Self-pay | Admitting: Internal Medicine

## 2019-01-10 ENCOUNTER — Telehealth: Payer: Self-pay | Admitting: *Deleted

## 2019-01-10 ENCOUNTER — Other Ambulatory Visit: Payer: Self-pay

## 2019-01-10 VITALS — Temp 97.4°F

## 2019-01-10 DIAGNOSIS — Z719 Counseling, unspecified: Secondary | ICD-10-CM

## 2019-01-10 DIAGNOSIS — Z510 Encounter for antineoplastic radiation therapy: Secondary | ICD-10-CM | POA: Diagnosis not present

## 2019-01-10 DIAGNOSIS — J90 Pleural effusion, not elsewhere classified: Secondary | ICD-10-CM

## 2019-01-10 MED ORDER — SUCRALFATE 1 GM/10ML PO SUSP: 1.0000 g | Freq: Three times a day (TID) | ORAL | 0 refills | Status: DC

## 2019-01-10 NOTE — Telephone Encounter (Signed)
Called regarding 4/21

## 2019-01-10 NOTE — Telephone Encounter (Signed)
Oral Chemotherapy Pharmacist Encounter   I spoke with patient for overview of: Tagrisso (osimertinib) for the treatment of metastatic, non small cell lung cancer, EGFR mutation positive in exons 18 and 21, planned duration until disease progression or unacceptable toxicity.   Counseled patient on administration, dosing, side effects, monitoring, drug-food interactions, safe handling, storage, and disposal.  Patient will take Tagrisso 80 tablets, 1 tablet by mouth once daily, without regard to food.  Tagrisso start date: 01/13/2019  Adverse effects include but are not limited to: diarrhea, mouth sores, decreased appetitie, fatigue, dry skin, rash, nail changes, altered cardiac conduction, and decreased blood counts or electrolytes.  Patient will obtain anti diarrheal and alert the office of 4 or more loose stools above baseline.   Reviewed with patient importance of keeping a medication schedule and plan for any missed doses.  Ms. jha voiced understanding and appreciation.   All questions answered. Medication reconciliation performed and medication/allergy list updated.  Insurance authorization for Newman Nip has been approved. Test claim at the pharmacy reveals copayment $0 for 1st fill of Tagrisso. [facility] Nip will ship from the [facility] tomorrow for delivery to patient's home on Thursday, 01/12/2019.  Patient's insurance will only allow 1 fill from a local pharmacy. Subsequent fills must be dispensed through [facility] per insurance requirement. I will plan to send Tagrisso prescription to [facility] on 01/27/19 to allow time for prescription processing at outside pharmacy prior to needing those pills.  Patient knows to call the office with questions or concerns. Oral Oncology Clinic will continue to follow.  Johny Drilling, PharmD, BCPS, BCOP  01/10/2019   9:54 AM Oral Oncology Clinic [phone]

## 2019-01-10 NOTE — Progress Notes (Signed)
Drained left PleurX catheter today that was placed by [facility]. 1000 cc's red/bloody fluid. Patient and her husband watched and feels like they can drain her pleurX themselves going forward. Her insurance company has still not found any home health agency that will except her insurance. I did advise her that if she needed to come back tomorrow or Wednesday for additional teaching, that we would be available. She stated that she would call and let us know if needed.

## 2019-01-10 NOTE — Telephone Encounter (Signed)
Received call from [place] stating pt cancelled [facility] referral. States Surgeon's office did education with her on how to drain and care for Pleurx. Jonnie Finner RN CCM Case Mgmt phone [phone]

## 2019-01-11 ENCOUNTER — Encounter: Payer: Self-pay | Admitting: Radiation Oncology

## 2019-01-11 ENCOUNTER — Ambulatory Visit
Admission: RE | Admit: 2019-01-11 | Discharge: 2019-01-11 | Disposition: A | Discharge status: Home / Self Care | Payer: Managed Care, Other (non HMO) | Source: Ambulatory Visit | Attending: Radiation Oncology | Admitting: Radiation Oncology

## 2019-01-11 ENCOUNTER — Other Ambulatory Visit: Payer: Self-pay

## 2019-01-11 DIAGNOSIS — Z79891 Long term (current) use of opiate analgesic: Secondary | ICD-10-CM | POA: Diagnosis not present

## 2019-01-11 DIAGNOSIS — Z800 Family history of malignant neoplasm of digestive organs: Secondary | ICD-10-CM | POA: Insufficient documentation

## 2019-01-11 DIAGNOSIS — C7971 Secondary malignant neoplasm of right adrenal gland: Secondary | ICD-10-CM | POA: Insufficient documentation

## 2019-01-11 DIAGNOSIS — Z7952 Long term (current) use of systemic steroids: Secondary | ICD-10-CM | POA: Diagnosis not present

## 2019-01-11 DIAGNOSIS — C782 Secondary malignant neoplasm of pleura: Secondary | ICD-10-CM | POA: Insufficient documentation

## 2019-01-11 DIAGNOSIS — J910 Malignant pleural effusion: Secondary | ICD-10-CM | POA: Insufficient documentation

## 2019-01-11 DIAGNOSIS — M545 Low back pain: Secondary | ICD-10-CM | POA: Diagnosis not present

## 2019-01-11 DIAGNOSIS — Z79890 Hormone replacement therapy (postmenopausal): Secondary | ICD-10-CM | POA: Diagnosis not present

## 2019-01-11 DIAGNOSIS — Z79899 Other long term (current) drug therapy: Secondary | ICD-10-CM | POA: Diagnosis not present

## 2019-01-11 DIAGNOSIS — C7951 Secondary malignant neoplasm of bone: Secondary | ICD-10-CM | POA: Diagnosis not present

## 2019-01-11 DIAGNOSIS — K5900 Constipation, unspecified: Secondary | ICD-10-CM | POA: Insufficient documentation

## 2019-01-11 DIAGNOSIS — E039 Hypothyroidism, unspecified: Secondary | ICD-10-CM | POA: Insufficient documentation

## 2019-01-11 DIAGNOSIS — C3492 Malignant neoplasm of unspecified part of left bronchus or lung: Secondary | ICD-10-CM | POA: Insufficient documentation

## 2019-01-11 DIAGNOSIS — C7931 Secondary malignant neoplasm of brain: Secondary | ICD-10-CM | POA: Diagnosis not present

## 2019-01-11 DIAGNOSIS — Z87891 Personal history of nicotine dependence: Secondary | ICD-10-CM | POA: Insufficient documentation

## 2019-01-11 DIAGNOSIS — J189 Pneumonia, unspecified organism: Secondary | ICD-10-CM | POA: Insufficient documentation

## 2019-01-11 DIAGNOSIS — R0902 Hypoxemia: Secondary | ICD-10-CM | POA: Insufficient documentation

## 2019-01-11 DIAGNOSIS — R590 Localized enlarged lymph nodes: Secondary | ICD-10-CM | POA: Insufficient documentation

## 2019-01-11 DIAGNOSIS — Z510 Encounter for antineoplastic radiation therapy: Secondary | ICD-10-CM | POA: Diagnosis not present

## 2019-01-11 MED FILL — TAGRISSO 80 MG TABLET: 80 | 30 days supply | Qty: 30 | Fill #0

## 2019-01-12 ENCOUNTER — Telehealth: Payer: Self-pay | Admitting: Medical Oncology

## 2019-01-12 NOTE — Telephone Encounter (Signed)
Oral Oncology Pharmacist Encounter  Received call from patient stating that she had been started on sucralfate 4 times daily starting yesterday, 01/11/2019. No noted interactions between Tagrisso and sucralfate, however, it is noted that sucralfate should generally be separated from other medications by 2 hours. Patient confirmed that Newman Nip was delivered to her home today. She will start [place] tomorrow, 01/13/2019. She will administer Tagrisso in between sucralfate administrations until sucralfate is discontinued and then will likely move Tagrisso to administration with breakfast.  All questions answered. Patient knows to call the office with any additional questions or concerns.  Johny Drilling, PharmD, BCPS, BCOP  01/12/2019 3:21 PM Oral Oncology [facility] [phone]

## 2019-01-12 NOTE — Telephone Encounter (Signed)
Oral Oncology Patient Advocate Encounter  Confirmed with [place] that Temodar was shipped on 01/11/19 with a $0 copay.  [facility] Phone [phone] Fax [phone] 01/12/2019    9:10 AM

## 2019-01-12 NOTE — Telephone Encounter (Signed)
Nose bleed- Received message from case manager that pt had ?nose bleed with a clot in nose. Attempted to call pt -no voice mail.  Tagrisso refills -where will they be refilled. Per Denyse Amass her refills will come from [facility] from now on.

## 2019-01-16 ENCOUNTER — Telehealth: Payer: Self-pay | Admitting: Medical Oncology

## 2019-01-16 NOTE — Telephone Encounter (Addendum)
Faxed recent office visit note to [facility]

## 2019-01-18 ENCOUNTER — Telehealth: Payer: Self-pay | Admitting: Medical Oncology

## 2019-01-18 NOTE — Telephone Encounter (Signed)
Unable to reach pt re pain management -no voice mail.

## 2019-01-18 NOTE — Telephone Encounter (Signed)
Dr Lawson Fiscal is not managing pts pain.

## 2019-01-19 ENCOUNTER — Telehealth: Payer: Self-pay | Admitting: *Deleted

## 2019-01-19 NOTE — Telephone Encounter (Signed)
Received call from patient requesting refill on her oxycodone. Reviewed chart-oxycodone was discontinued at time of discharge on 01/08/19 Pt states she has a pleuryx drain and when it is drained, she has left died chest wall pain for a day or so after. Reviewed other options for her. Though she is on Eliquis-she does have Ibuprofen ordered 200 mg 1 every 8 hours on her hospital discharge list. Also suggested heating pad set on only low or medium at the most. Also she could try a topical OTC pain reliever cream or patch such Salonpas, theracare, biofreeze. She is agreeable to try these suggestions.

## 2019-01-23 ENCOUNTER — Other Ambulatory Visit: Payer: Self-pay | Admitting: Medical Oncology

## 2019-01-23 DIAGNOSIS — C3492 Malignant neoplasm of unspecified part of left bronchus or lung: Secondary | ICD-10-CM

## 2019-01-23 MED ORDER — OSIMERTINIB MESYLATE 80 MG PO TABS: 80.0000 mg | ORAL_TABLET | Freq: Every day | ORAL | 2 refills | Status: DC

## 2019-01-26 ENCOUNTER — Ambulatory Visit: Payer: Managed Care, Other (non HMO) | Admitting: Physician Assistant

## 2019-01-26 NOTE — Progress Notes (Signed)
  Radiation Oncology         [phone]) [phone] ________________________________  Name: Tina Cruz MRN: [id number]  Date: 01/11/2019  DOB: 09-24-1961  End of Treatment Note  Diagnosis:   58 y.o. female with Stage IV (T2b, N3, M1 C) non-small cell lung cancer, adenocarcinomawith lymphadenopathy and metastases to bone, right adrenal gland, and brain    Indication for treatment:  Palliative       Radiation treatment dates:   12/21/2018 - 01/11/2019  Site/dose:    1. The patient received 35 Gy in 14 fractions directed at the left upper lung mass. 2. The left pleural-based metastasis/rib metastasis and the osseous metastasis in the lower thoracic spine region received 35 gray in 14 fractions.  Beams/energy:    1. 3D / 6X Photon 2. 3D / 15X, 10X Photon  Narrative: The patient tolerated radiation treatment relatively well.  She is breathing easier since her therapeutic thoracentesis. She does have a indwelling catheter for pleural fluid at this time. She reports her pain is a little better. Over her course of radiation treatment, she developed dysphasia but was able to maintain a good appetite. She also noted moderate fatigue.  Plan: The patient has completed radiation treatment. The patient will return to radiation oncology clinic for routine followup in one month. I advised them to call or return sooner if they have any questions or concerns related to their recovery or treatment.  -----------------------------------  Blair Promise, PhD, MD  This document serves as a record of services personally performed by Gery Pray, MD. It was created on his behalf by Rae Lips, a trained medical scribe. The creation of this record is based on the scribe's personal observations and the provider's statements to them. This document has been checked and approved by the attending provider.

## 2019-01-27 ENCOUNTER — Other Ambulatory Visit: Payer: Self-pay | Admitting: Family Medicine

## 2019-01-27 ENCOUNTER — Inpatient Hospital Stay (HOSPITAL_COMMUNITY)
Admission: EM | Admit: 2019-01-27 | Discharge: 2019-01-30 | DRG: 193 | Disposition: A | Discharge status: Home / Self Care | Payer: Managed Care, Other (non HMO) | Attending: Internal Medicine | Admitting: Internal Medicine

## 2019-01-27 ENCOUNTER — Telehealth: Payer: Self-pay | Admitting: *Deleted

## 2019-01-27 ENCOUNTER — Inpatient Hospital Stay (HOSPITAL_COMMUNITY): Payer: Managed Care, Other (non HMO)

## 2019-01-27 ENCOUNTER — Emergency Department (HOSPITAL_COMMUNITY): Payer: Managed Care, Other (non HMO)

## 2019-01-27 ENCOUNTER — Other Ambulatory Visit: Payer: Self-pay

## 2019-01-27 ENCOUNTER — Telehealth: Payer: Self-pay | Admitting: Pharmacist

## 2019-01-27 ENCOUNTER — Encounter (HOSPITAL_COMMUNITY): Payer: Self-pay | Admitting: Pharmacy Technician

## 2019-01-27 ENCOUNTER — Telehealth: Payer: Self-pay

## 2019-01-27 DIAGNOSIS — R21 Rash and other nonspecific skin eruption: Secondary | ICD-10-CM | POA: Diagnosis not present

## 2019-01-27 DIAGNOSIS — J189 Pneumonia, unspecified organism: Secondary | ICD-10-CM | POA: Diagnosis present

## 2019-01-27 DIAGNOSIS — Z79899 Other long term (current) drug therapy: Secondary | ICD-10-CM

## 2019-01-27 DIAGNOSIS — R0602 Shortness of breath: Secondary | ICD-10-CM

## 2019-01-27 DIAGNOSIS — J181 Lobar pneumonia, unspecified organism: Secondary | ICD-10-CM | POA: Diagnosis not present

## 2019-01-27 DIAGNOSIS — I2699 Other pulmonary embolism without acute cor pulmonale: Secondary | ICD-10-CM | POA: Diagnosis not present

## 2019-01-27 DIAGNOSIS — J90 Pleural effusion, not elsewhere classified: Secondary | ICD-10-CM | POA: Diagnosis not present

## 2019-01-27 DIAGNOSIS — J302 Other seasonal allergic rhinitis: Secondary | ICD-10-CM | POA: Diagnosis present

## 2019-01-27 DIAGNOSIS — J910 Malignant pleural effusion: Secondary | ICD-10-CM | POA: Diagnosis present

## 2019-01-27 DIAGNOSIS — L299 Pruritus, unspecified: Secondary | ICD-10-CM

## 2019-01-27 DIAGNOSIS — L509 Urticaria, unspecified: Secondary | ICD-10-CM | POA: Diagnosis not present

## 2019-01-27 DIAGNOSIS — J329 Chronic sinusitis, unspecified: Secondary | ICD-10-CM

## 2019-01-27 DIAGNOSIS — I2782 Chronic pulmonary embolism: Secondary | ICD-10-CM | POA: Diagnosis present

## 2019-01-27 DIAGNOSIS — Z79890 Hormone replacement therapy (postmenopausal): Secondary | ICD-10-CM

## 2019-01-27 DIAGNOSIS — J42 Unspecified chronic bronchitis: Secondary | ICD-10-CM | POA: Diagnosis present

## 2019-01-27 DIAGNOSIS — Z7901 Long term (current) use of anticoagulants: Secondary | ICD-10-CM

## 2019-01-27 DIAGNOSIS — Z87891 Personal history of nicotine dependence: Secondary | ICD-10-CM

## 2019-01-27 DIAGNOSIS — Z20828 Contact with and (suspected) exposure to other viral communicable diseases: Secondary | ICD-10-CM | POA: Diagnosis present

## 2019-01-27 DIAGNOSIS — Z888 Allergy status to other drugs, medicaments and biological substances status: Secondary | ICD-10-CM | POA: Diagnosis not present

## 2019-01-27 DIAGNOSIS — J9601 Acute respiratory failure with hypoxia: Secondary | ICD-10-CM | POA: Diagnosis present

## 2019-01-27 DIAGNOSIS — C3492 Malignant neoplasm of unspecified part of left bronchus or lung: Secondary | ICD-10-CM | POA: Diagnosis present

## 2019-01-27 DIAGNOSIS — K219 Gastro-esophageal reflux disease without esophagitis: Secondary | ICD-10-CM | POA: Diagnosis present

## 2019-01-27 DIAGNOSIS — C7931 Secondary malignant neoplasm of brain: Secondary | ICD-10-CM | POA: Diagnosis present

## 2019-01-27 DIAGNOSIS — E039 Hypothyroidism, unspecified: Secondary | ICD-10-CM | POA: Diagnosis present

## 2019-01-27 LAB — BASIC METABOLIC PANEL
Anion gap: 6 (ref 5–15)
BUN: 5 mg/dL — ABNORMAL LOW (ref 6–20)
CO2: 29 mmol/L (ref 22–32)
Calcium: 8.9 mg/dL (ref 8.9–10.3)
Chloride: 107 mmol/L (ref 98–111)
Creatinine, Ser: 0.65 mg/dL (ref 0.44–1.00)
GFR calc Af Amer: >60 mL/min (ref >60)
GFR calc non Af Amer: >60 mL/min (ref >60)
Glucose, Bld: 87 mg/dL (ref 70–99)
Potassium: 3.2 mmol/L — ABNORMAL LOW (ref 3.5–5.1)
Sodium: 142 mmol/L (ref 135–145)

## 2019-01-27 LAB — TROPONIN I: Troponin I: <0.03 ng/mL (ref <0.03)

## 2019-01-27 LAB — URINALYSIS, ROUTINE W REFLEX MICROSCOPIC
Bilirubin Urine: NEGATIVE
Glucose, UA: NEGATIVE mg/dL
Hgb urine dipstick: NEGATIVE
Ketones, ur: 5 mg/dL — AB
Leukocytes,Ua: NEGATIVE
Nitrite: NEGATIVE
Protein, ur: NEGATIVE mg/dL
Specific Gravity, Urine: 1.017 (ref 1.005–1.030)
pH: 5 (ref 5.0–8.0)

## 2019-01-27 LAB — CBC
HCT: 32.1 % — ABNORMAL LOW (ref 36.0–46.0)
Hemoglobin: 10.7 g/dL — ABNORMAL LOW (ref 12.0–15.0)
MCH: 31.2 pg (ref 26.0–34.0)
MCHC: 33.3 g/dL (ref 30.0–36.0)
MCV: 93.6 fL (ref 80.0–100.0)
Platelets: 153 10*3/uL (ref 150–400)
RBC: 3.43 MIL/uL — ABNORMAL LOW (ref 3.87–5.11)
RDW: 14.8 % (ref 11.5–15.5)
WBC: 3.7 10*3/uL — ABNORMAL LOW (ref 4.0–10.5)
nRBC: 0 % (ref 0.0–0.2)

## 2019-01-27 LAB — BRAIN NATRIURETIC PEPTIDE: B Natriuretic Peptide: 29.5 pg/mL (ref 0.0–100.0)

## 2019-01-27 LAB — SARS CORONAVIRUS 2 BY RT PCR (HOSPITAL ORDER, PERFORMED IN ~~LOC~~ HOSPITAL LAB): SARS Coronavirus 2: NEGATIVE

## 2019-01-27 IMAGING — CT CT ANGIOGRAPHY CHEST
2 of 9 series · 18 of 36 positions shown · IV contrast (omnipaque)
Comparison: [DATE] CT chest. [DATE] PET-CT.

CLINICAL DATA: 57 y/o F; PE suspected, high pretest prob Look for
recurrent PE, is on eliquis though: other DDx includes CAP, COVID,
or ILD/Pneumonitis from Osimertinib. History of adenocarcinoma of
the lung.

EXAM:
CT ANGIOGRAPHY CHEST WITH CONTRAST
TECHNIQUE: Multidetector CT imaging of the chest was performed using the
standard protocol during bolus administration of intravenous
contrast. Multiplanar CT image reconstructions and MIPs were
obtained to evaluate the vascular anatomy.
CONTRAST:  100mL OMNIPAQUE IOHEXOL 350 MG/ML SOLN

[Series 7: thins · axial · 0.92mm/px · z∈[-342,-60]mm · 17 of 316 slices shown]
[im 17/316  lung]
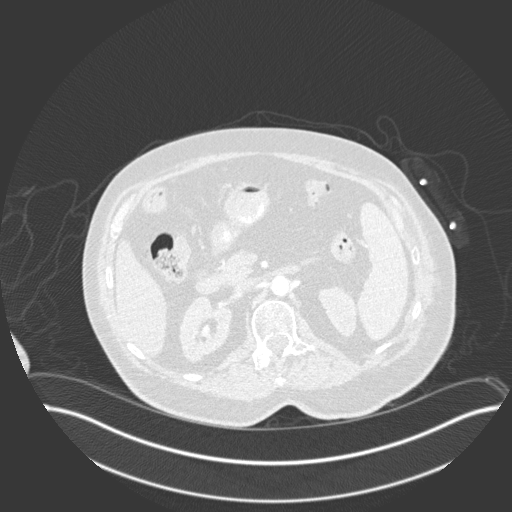
[im 34/316  mediastinal]
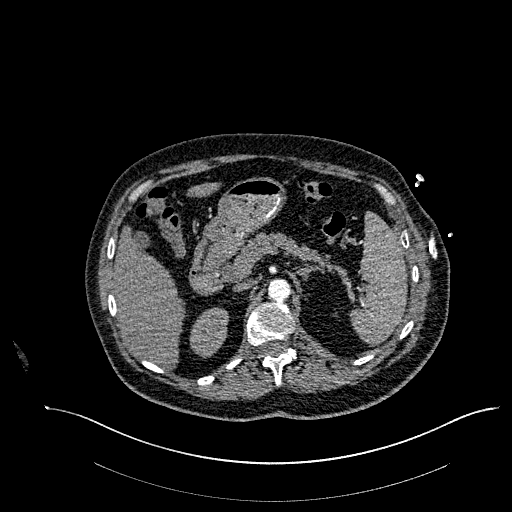
[im 50/316  lung]
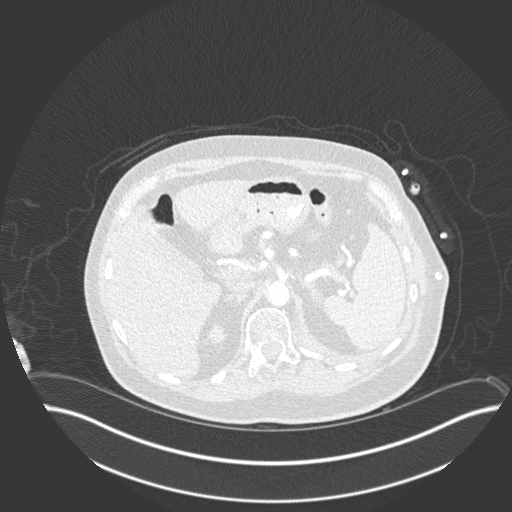
[im 67/316  mediastinal]
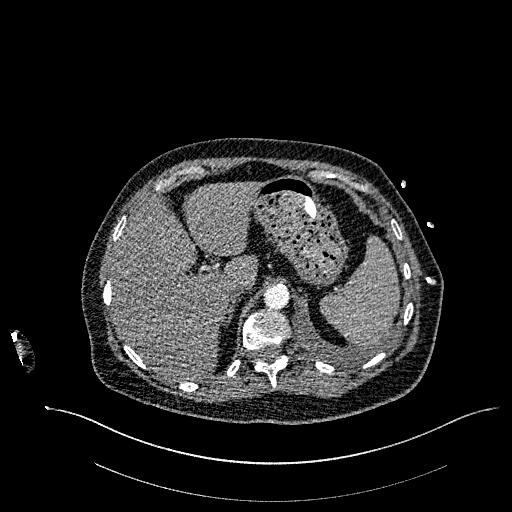
[im 83/316  lung]
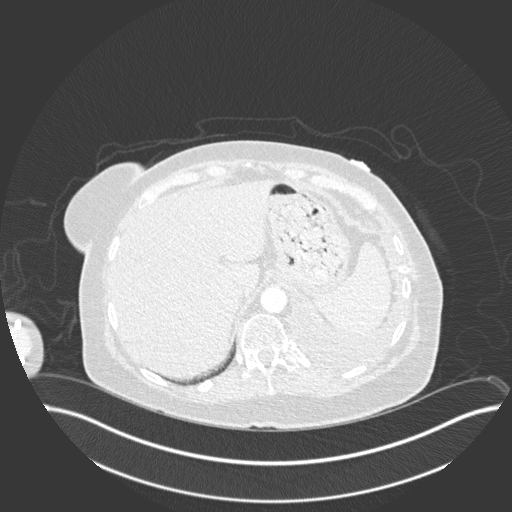
[im 100/316  mediastinal]
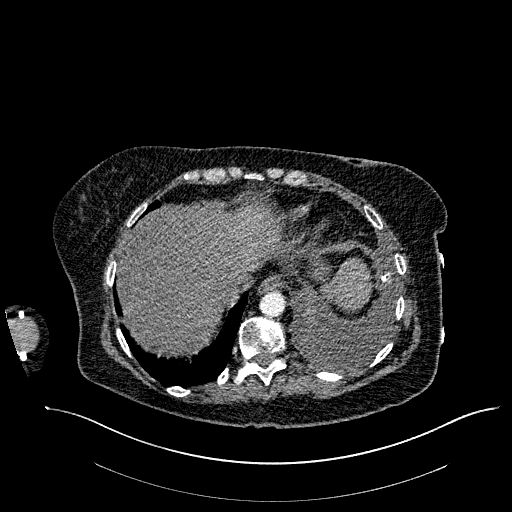
[im 117/316  lung]
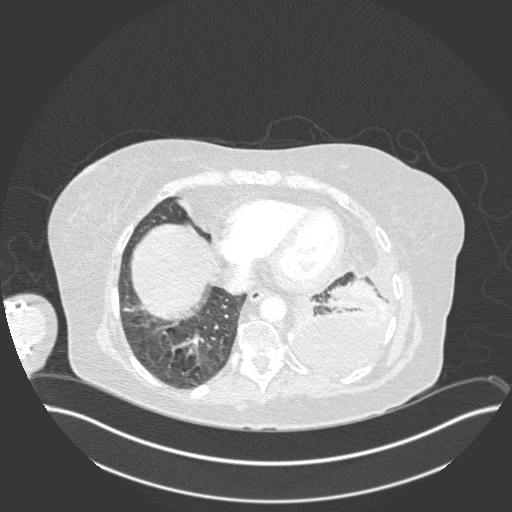
[im 133/316  mediastinal]
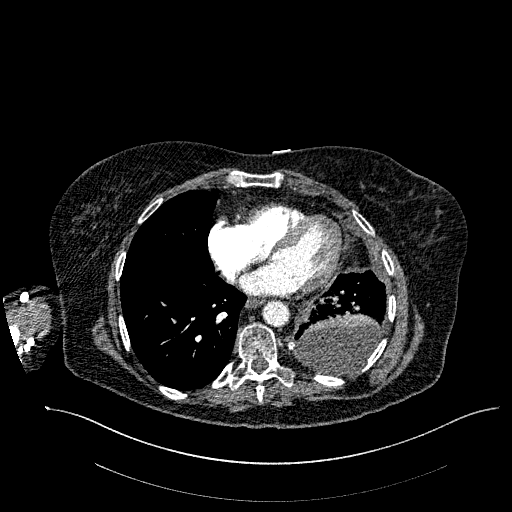
[im 166/316  lung]
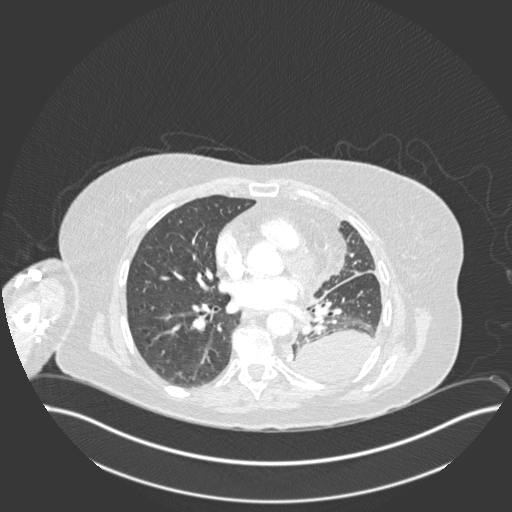
[im 183/316  mediastinal]
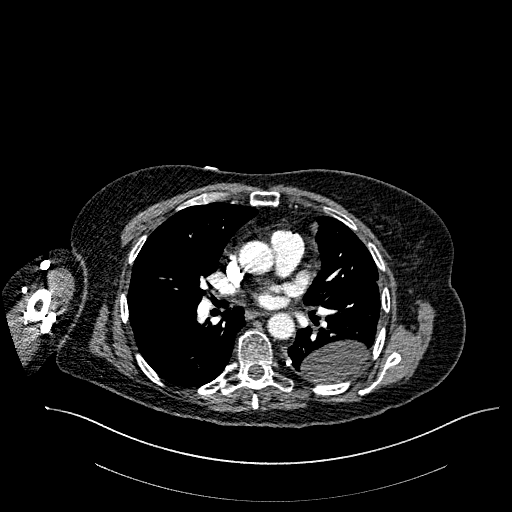
[im 199/316  lung]
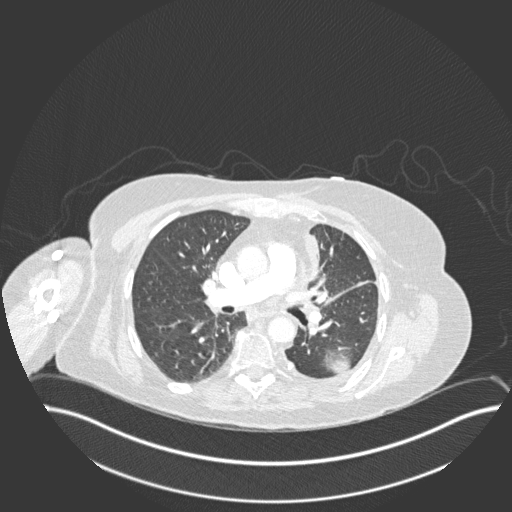
[im 216/316  mediastinal]
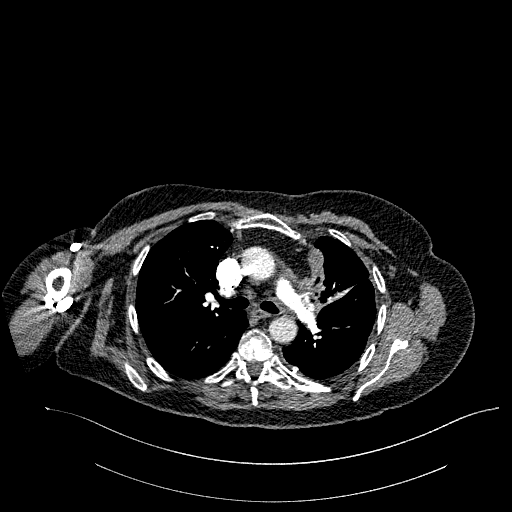
[im 233/316  lung]
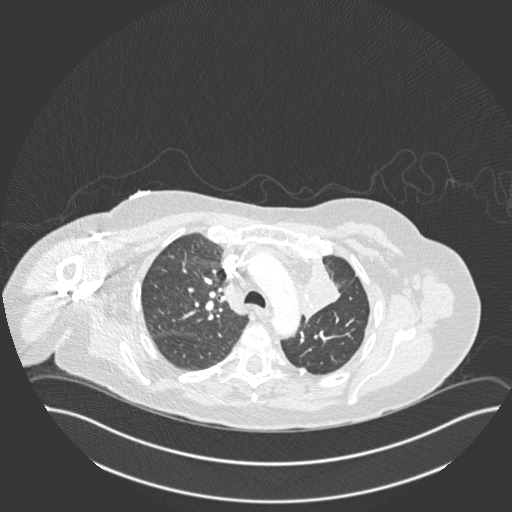
[im 249/316  mediastinal]
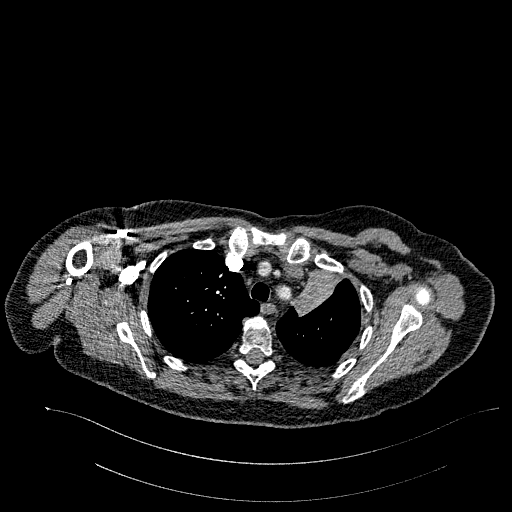
[im 266/316  lung]
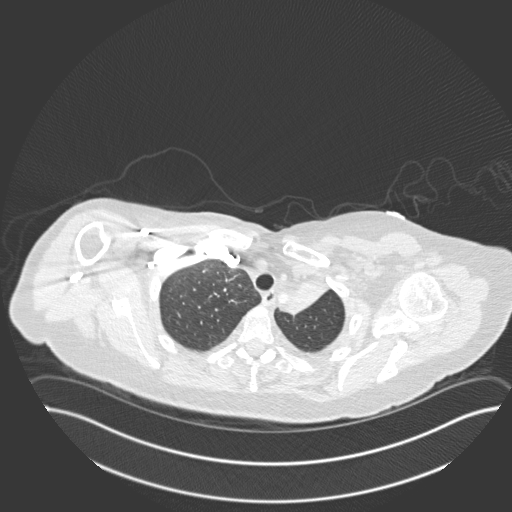
[im 282/316  mediastinal]
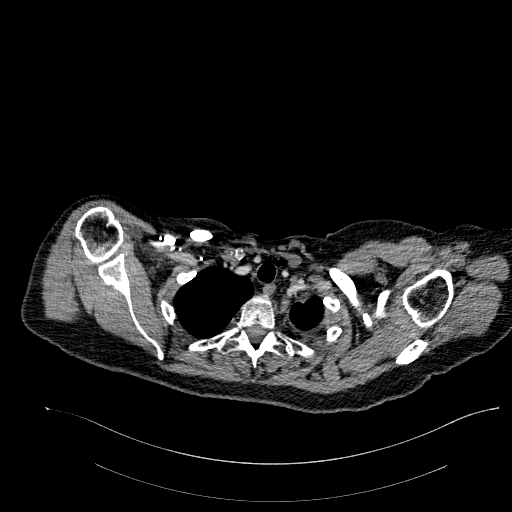
[im 299/316  lung]
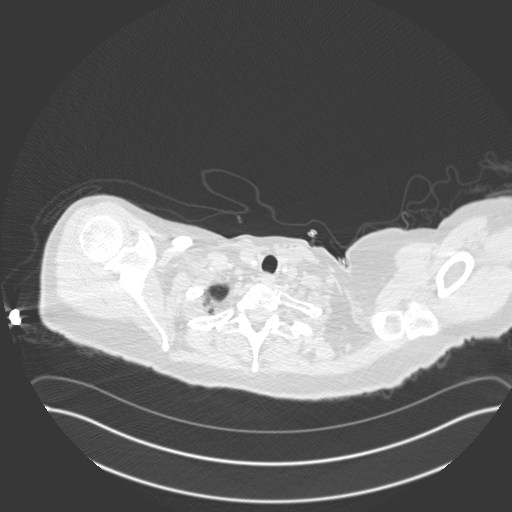

[Series 9: coronal mpr · coronal · 0.62mm/px · 1 of 151 slices shown]
[im 76/151  mediastinal]
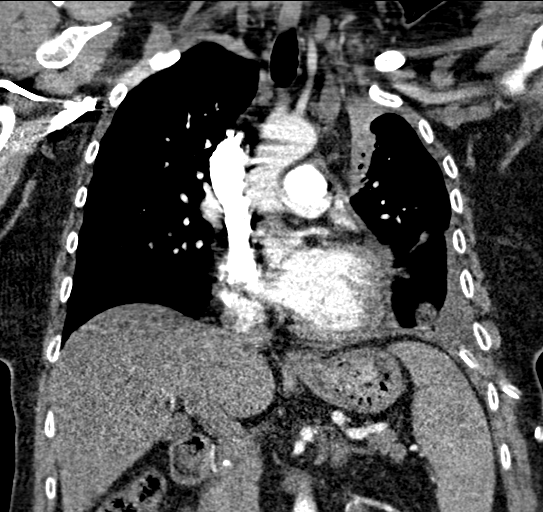

[18 of 36 positions shown; findings below may reference images not displayed]

FINDINGS: Cardiovascular: Satisfactory opacification of the pulmonary
arteries. No acute pulmonary embolus identified. Within the right
lower lobe segmental pulmonary artery there is a linear opacity
compatible with a small residual chronic embolus.

Normal heart size. No pericardial effusion. Mild coronary artery
calcific atherosclerosis. Normal caliber aorta and main pulmonary
artery.

Mediastinum/Nodes: No enlarged mediastinal, hilar, or axillary lymph
nodes. Borderline left supraclavicular lymph node measuring 11 mm
short axis (series 7, image 32). Thyroid gland, trachea, and
esophagus demonstrate no significant findings.

Lungs/Pleura: Left suprahilar mass within the medial left upper lobe
again noted. The margins of the mass are poorly delineated from
associated postobstructive consolidation and atelectasis in the left
upper lobe. The mass appears decreased in size best appreciated on
the coronal reconstruction as demonstrated by decreased soft tissue
within the adjacent mediastinum and local architectural distortion.
There are decreased ground-glass opacities in the aerated portion of
left upper lobe and the left lower lobe when compared with the prior
study likely representing interval improvement of pneumonitis.
Additionally, the left-sided pleural effusion is much decreased in
size. There is a residual posterior loculation pleural fluid lateral
to the PleurX catheter. PleurX catheter tip is stable in position
level of the left fourth posterior rib.

Upper Abdomen: No acute abnormality.

Musculoskeletal: Sclerotic osseous metastasis at base of left
glenoid, multiple thoracic vertebral bodies, and ribs better
characterized on prior PET-CT. Thoracic metastasis are best
appreciated at T1, T3, T8, T9, T10. No acute fracture identified.

Review of the MIP images confirms the above findings.
IMPRESSION: 1. No acute pulmonary embolus identified. Small residual chronic
pulmonary embolus within a right lower lobe segmental pulmonary
artery.
2. Left suprahilar mass appears decreased in size from prior CTA of
the chest. Near complete resolution of postobstructive pneumonitis
and improved aeration of left lung.
3. Decreased size of left-sided pleural effusion. Residual loculated
pleural fluid lateral to the PleurX catheter.
4. Stable borderline left supraclavicular lymph node, tracer avid on
prior PET-CT. Stable osseous metastatic disease.

## 2019-01-27 IMAGING — DX CHEST - 2 VIEW
2 series · 2 of 2 positions shown · non-contrast
Comparison: CT chest [DATE]

CLINICAL DATA: Chest pain

EXAM:
CHEST - 2 VIEW

[chest pa]
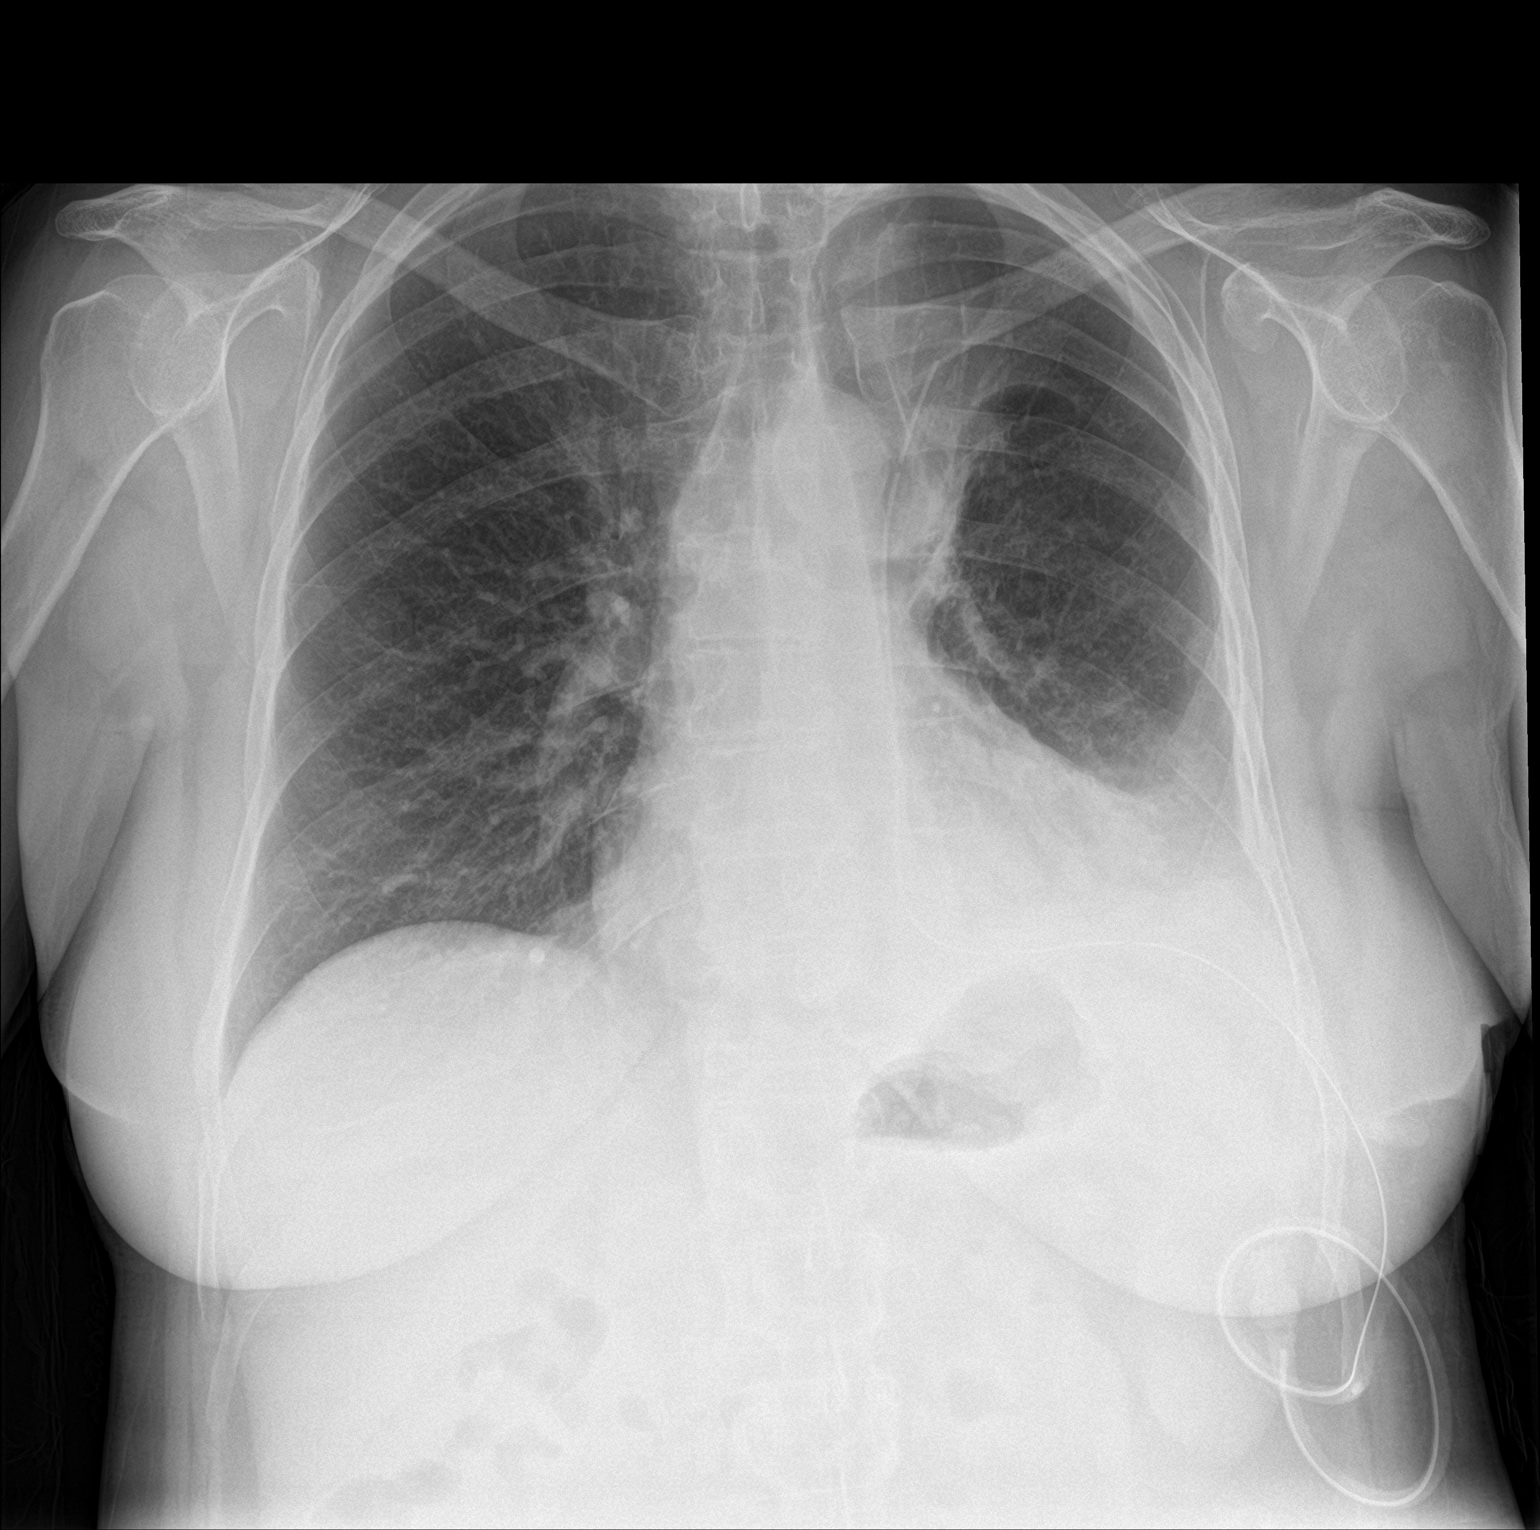

[chest lat]
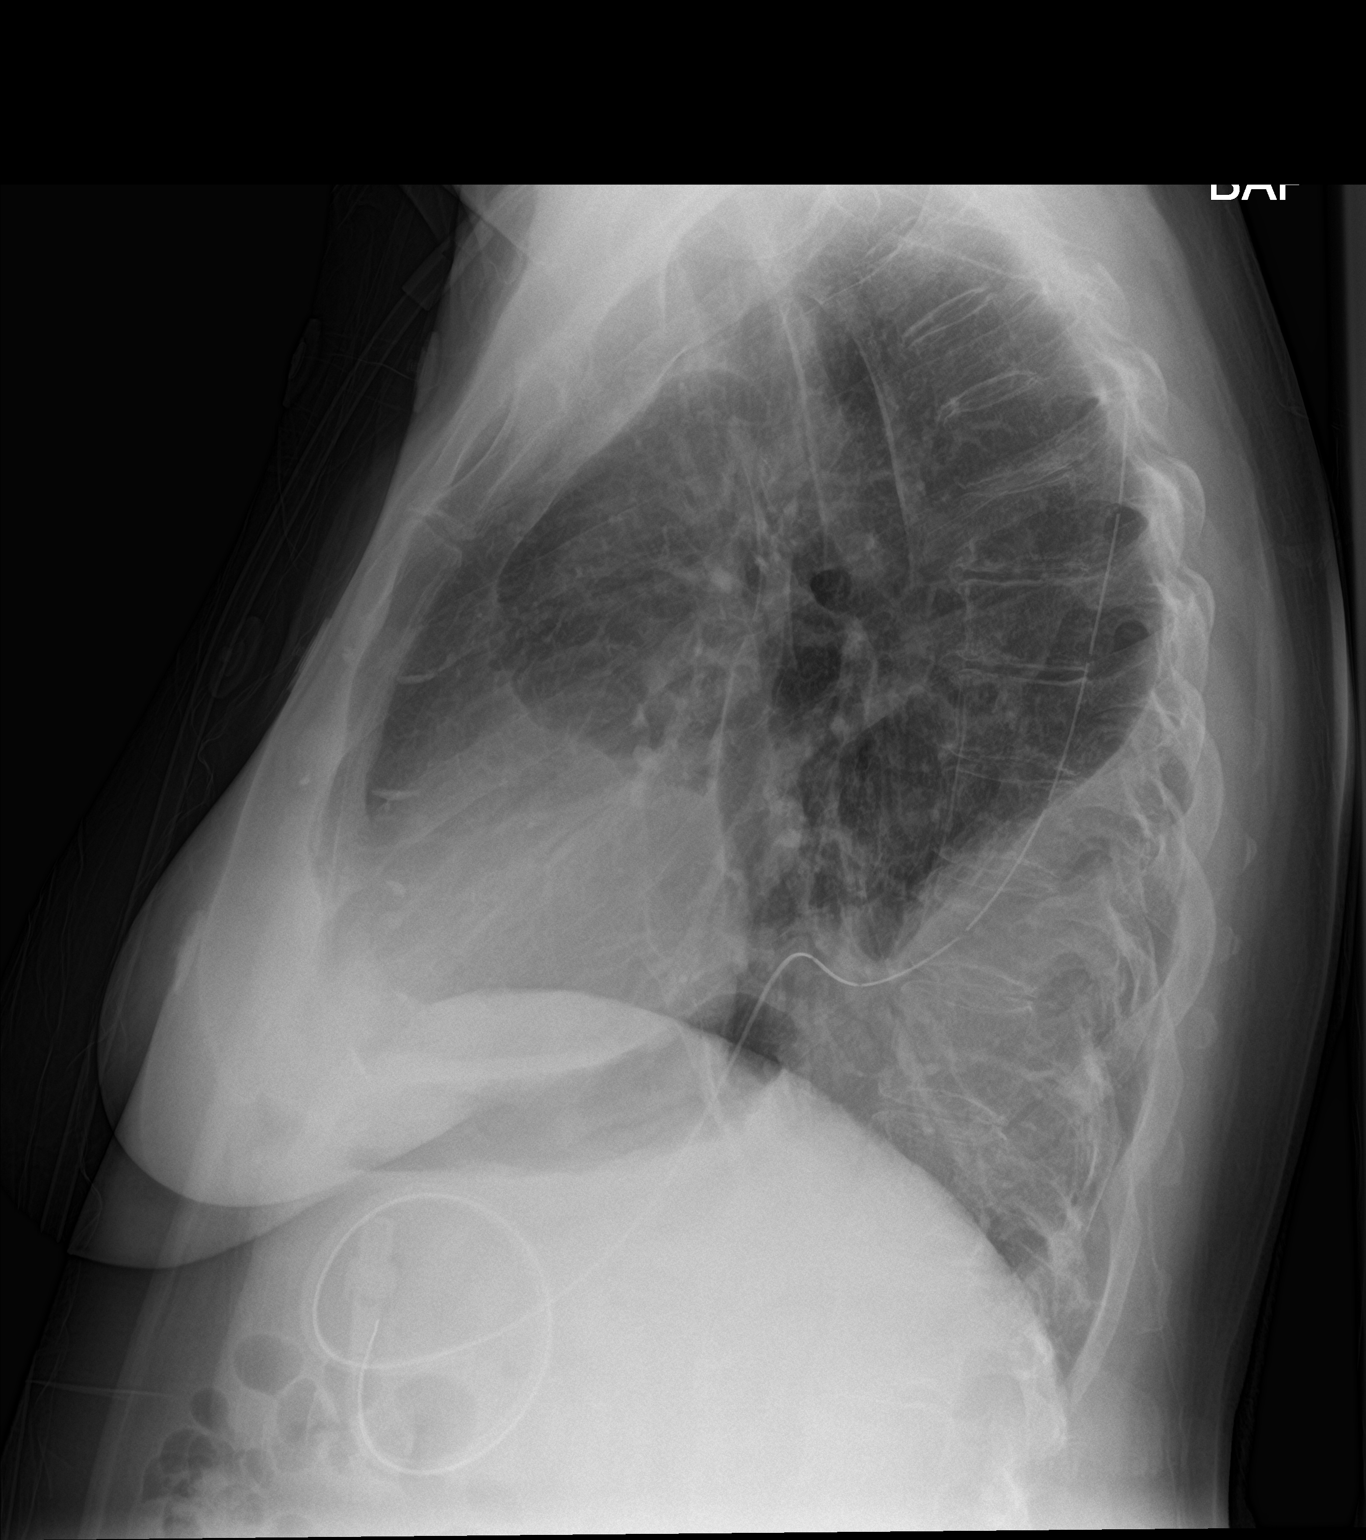

[2 of 2 positions shown; findings below may reference images not displayed]

FINDINGS: Left-sided chest tube is present. Small left pleural effusion. Left
basilar airspace disease and mild left suprahilar airspace disease.
No pneumothorax. Stable cardiomediastinal silhouette. No aggressive
osseous lesion.
IMPRESSION: 1. Left-sided chest tube without a pneumothorax. Small left pleural
effusion. Left basilar airspace disease and left suprahilar airspace
disease most concerning for atelectasis versus pneumonia.

## 2019-01-27 MED ORDER — FAMOTIDINE 20 MG PO TABS: 20.0000 mg | ORAL_TABLET | Freq: Every day | ORAL | Status: DC | PRN

## 2019-01-27 MED ORDER — ALBUTEROL SULFATE HFA 108 (90 BASE) MCG/ACT IN AERS
1.0000 | INHALATION_SPRAY | RESPIRATORY_TRACT | Status: DC | PRN
  Filled 2019-01-27: qty 6.7

## 2019-01-27 MED ORDER — ACETAMINOPHEN 325 MG PO TABS
325.0000 mg | ORAL_TABLET | Freq: Four times a day (QID) | ORAL | Status: DC | PRN
  Administered 2019-01-28 – 2019-01-30 (×7): 325 mg via ORAL
  Filled 2019-01-27 (×7): qty 1

## 2019-01-27 MED ORDER — AZELASTINE HCL 0.1 % NA SOLN
1.0000 | Freq: Two times a day (BID) | NASAL | Status: DC | PRN
  Filled 2019-01-27: qty 30

## 2019-01-27 MED ORDER — SODIUM CHLORIDE 0.9 % IV SOLN
500.0000 mg | Freq: Once | INTRAVENOUS | Status: AC
  Administered 2019-01-27: 500 mg via INTRAVENOUS
  Filled 2019-01-27: qty 500

## 2019-01-27 MED ORDER — LEVOTHYROXINE SODIUM 112 MCG PO TABS
112.0000 ug | ORAL_TABLET | Freq: Every day | ORAL | Status: DC
  Administered 2019-01-28 – 2019-01-30 (×3): 112 ug via ORAL
  Filled 2019-01-27 (×3): qty 1

## 2019-01-27 MED ORDER — AZITHROMYCIN 500 MG PO TABS
500.0000 mg | ORAL_TABLET | ORAL | Status: DC
  Administered 2019-01-28 – 2019-01-29 (×2): 500 mg via ORAL
  Filled 2019-01-27 (×2): qty 1

## 2019-01-27 MED ORDER — SODIUM CHLORIDE 0.9% FLUSH
3.0000 mL | Freq: Once | INTRAVENOUS | Status: AC
  Administered 2019-01-27: 20:00:00 3 mL via INTRAVENOUS

## 2019-01-27 MED ORDER — LEVOTHYROXINE SODIUM 112 MCG PO TABS: 112.0000 ug | ORAL_TABLET | Freq: Every day | ORAL | 1 refills | Status: DC

## 2019-01-27 MED ORDER — IOHEXOL 350 MG/ML SOLN
100.0000 mL | Freq: Once | INTRAVENOUS | Status: AC | PRN
  Administered 2019-01-27: 100 mL via INTRAVENOUS

## 2019-01-27 MED ORDER — SODIUM CHLORIDE 0.9 % IV SOLN
1.0000 g | INTRAVENOUS | Status: DC
  Administered 2019-01-28: 17:00:00 1 g via INTRAVENOUS
  Filled 2019-01-27: qty 10

## 2019-01-27 MED ORDER — APIXABAN 5 MG PO TABS
5.0000 mg | ORAL_TABLET | Freq: Two times a day (BID) | ORAL | Status: DC
  Administered 2019-01-27 – 2019-01-30 (×6): 5 mg via ORAL
  Filled 2019-01-27 (×6): qty 1

## 2019-01-27 MED ORDER — POTASSIUM CHLORIDE CRYS ER 20 MEQ PO TBCR
40.0000 meq | EXTENDED_RELEASE_TABLET | Freq: Once | ORAL | Status: AC
  Administered 2019-01-27: 21:00:00 40 meq via ORAL
  Filled 2019-01-27: qty 2

## 2019-01-27 MED ORDER — SODIUM CHLORIDE 0.9 % IV SOLN
1.0000 g | Freq: Once | INTRAVENOUS | Status: AC
  Administered 2019-01-27: 21:00:00 1 g via INTRAVENOUS
  Filled 2019-01-27: qty 10

## 2019-01-27 NOTE — ED Triage Notes (Signed)
Pt reports waking up this morning and felt unable to breathe. Upon arrival to the ED pt was using her family members oxygen tank at 2L [place] and was 89%. Pt does not use oxygen normally. Pt oxygen levels eventually raised to 100% and pt being weaned down. 100% on 0.5L [place] at this time. Also c/o a "slight twinge" of pain to her chest.

## 2019-01-27 NOTE — ED Provider Notes (Signed)
[facility] EMERGENCY DEPARTMENT Provider Note   CSN: [id number] Arrival date & time: 01/27/19  1640    History   Chief Complaint Chief Complaint  Patient presents with  . Shortness of Breath    HPI Tina Cruz is a 58 y.o. female.     58 y.o female with a PMH of Adenocarcinoma of the left lung stage 4, PE presents to the ED with a chief complaint of shortness of breath x today. Patient reports waking up with shortness of breath and increase work of breathing. She reports this was exacerbated by ambulation.  Patient did feel slightly better when she oxygen at home placed 2 L.  She reports she is had a dry cough for the past week, states the cough has gotten worse the last 2 days.  Patient also reports a left breast achy pain which is worse with deep inspiration.  Patient currently has a pleural flexed in place from a month ago.  Patient is also on Eliquis along with Tagrisso for her stage IV left cancer. Patient denies any abdominal pain, urinary symptoms or weakness.      Past Medical History:  Diagnosis Date  . Active smoker    quit 2016  . Cancer ([place])    left lung stage IV non-cell carcinoma/adenocarcinoma with diagnosis made from left pleural effusion.    . Cancer ([place])    Brain metastasis   . Constipation   . Depression   . Dyspnea   . GERD (gastroesophageal reflux disease)   . H/O chronic bronchitis   . Hypothyroidism   . Seasonal allergies   . SVD (spontaneous vaginal delivery)    x 2  . Thyroid disease    hypothyroid  . Wears partial dentures     Patient Active Problem List   Diagnosis Date Noted  . Encounter for antineoplastic chemotherapy 01/09/2019  . Acute pulmonary embolism ([place]) 01/06/2019  . Adenocarcinoma of left lung, stage 4 ([place]) 12/14/2018  . Recurrent left pleural effusion 12/14/2018  . Bone metastasis ([place]) 12/14/2018  . Brain metastasis ([place]) 12/14/2018  . Goals of care, counseling/discussion 12/14/2018  . Cancer  associated pain 12/14/2018  . Allergic rhinitis 07/27/2018  . Hemangioma 03/11/2017  . Seborrheic keratosis 03/11/2017  . Skin tag 03/11/2017  . GERD (gastroesophageal reflux disease) 01/14/2015  . Thyroid disease     Past Surgical History:  Procedure Laterality Date  . COLONOSCOPY     x 2  . IR PERC PLEURAL DRAIN W/INDWELL CATH W/IMG GUIDE  01/06/2019  . TONSILLECTOMY    . TUBAL LIGATION       OB History   No obstetric history on file.      Home Medications    Prior to Admission medications   Medication Sig Start Date End Date Taking? Authorizing Provider  acetaminophen (TYLENOL) 325 MG tablet Take 325 mg by mouth every 6 (six) hours as needed for mild pain or headache.   Yes [provider]  apixaban (ELIQUIS) 5 MG TABS tablet Take 5 mg by mouth 2 (two) times daily.  01/08/19  Yes [provider]  azelastine (ASTELIN) 0.1 % nasal spray Place 1 spray into both nostrils 2 (two) times daily as needed for rhinitis. Use in each nostril as directed Patient taking differently: Place 1 spray into both nostrils 2 (two) times daily as needed for rhinitis.  11/02/18  Yes Delsa Grana, PA-C  Cetirizine HCl (ZYRTEC ALLERGY) 10 MG CAPS Take 1 capsule (10 mg total) by  mouth daily as needed (allergies). Patient taking differently: Take 1 capsule by mouth daily.  07/27/18  Yes Dena Billet B, PA-C  famotidine (PEPCID) 20 MG tablet Take 20 mg by mouth daily as needed for heartburn.    Yes [provider]  levalbuterol (XOPENEX HFA) 45 MCG/ACT inhaler Inhale 1-2 puffs into the lungs every 4 (four) hours as needed for wheezing or shortness of breath. 09/27/18  Yes Delsa Grana, PA-C  levothyroxine (SYNTHROID) 112 MCG tablet Take 1 tablet (112 mcg total) by mouth daily before breakfast. 01/27/19  Yes [place], Modena Nunnery, MD  LORazepam (ATIVAN) 1 MG tablet Take 1 tablet about 30 min before wearing mask for brain treatment. Must have a driver after taking this. Take 1 tab PRN  before MRIs in the future, if feeling anxious. Patient taking differently: Take 1 mg by mouth See admin instructions. Take 1 mg by mouth approx 30 minutes before wearing mask for brain treatment/must have a driver after taking this med. Also, take 1 mg by mouth as needed before any future MRI(s) as needed for anxiousness 12/27/18  Yes Eppie Gibson, MD  osimertinib mesylate (TAGRISSO) 80 MG tablet Take 1 tablet (80 mg total) by mouth daily. 01/23/19  Yes [name], MD  dexamethasone (DECADRON) 4 MG tablet Take 1 tablet (4 mg total) by mouth 2 (two) times daily. Patient not taking: Reported on 01/27/2019 12/14/18   [name], MD  magnesium citrate SOLN Take 0.5 Bottles by mouth daily as needed for moderate constipation or severe constipation.    [provider]  montelukast (SINGULAIR) 10 MG tablet TAKE 1 TABLET BY MOUTH EVERYDAY AT BEDTIME Patient not taking: Reported on 01/27/2019 01/27/19   [name], MD  polyethylene glycol [facility] / Floria Raveling) packet Take 17 g by mouth daily.     [provider]  sucralfate (CARAFATE) 1 GM/10ML suspension Take 10 mLs (1 g total) by mouth 4 (four) times daily -  with meals and at bedtime. Patient not taking: Reported on 01/27/2019 01/10/19   Gery Pray, MD    Family History Family History  Problem Relation Age of Onset  . Cancer Mother        colon  . Alcohol abuse Father     Social History Social History   Tobacco Use  . Smoking status: Former Smoker    Packs/day: 0.50    Years: 5.00    Pack years: 2.50    Types: Cigarettes    Last attempt to quit: 06/03/2015    Years since quitting: 3.6  . Smokeless tobacco: Never Used  Substance Use Topics  . Alcohol use: No    Alcohol/week: 0.0 standard drinks  . Drug use: No     Allergies   Albuterol   Review of Systems Review of Systems  Constitutional: Negative for chills and fever.  HENT: Negative for ear pain and sore throat.   Eyes: Negative for pain and  visual disturbance.  Respiratory: Positive for cough and shortness of breath.   Cardiovascular: Positive for chest pain. Negative for palpitations.  Gastrointestinal: Negative for abdominal pain and vomiting.  Genitourinary: Negative for dysuria and hematuria.  Musculoskeletal: Negative for arthralgias and back pain.  Skin: Negative for color change and rash.  Neurological: Negative for seizures and syncope.  All other systems reviewed and are negative.    Physical Exam Updated Vital Signs BP 127/72   Pulse 80   Temp 98.2 F (36.8 C) (Oral)   Resp 20   LMP  (  LMP Unknown)   SpO2 100%   Physical Exam Vitals signs and nursing note reviewed.  Constitutional:      General: She is not in acute distress.    Appearance: She is well-developed. She is ill-appearing.  HENT:     Head: Normocephalic and atraumatic.     Mouth/Throat:     Pharynx: No oropharyngeal exudate.  Eyes:     Pupils: Pupils are equal, round, and reactive to light.  Neck:     Musculoskeletal: Normal range of motion.  Cardiovascular:     Rate and Rhythm: Regular rhythm.     Heart sounds: Normal heart sounds.  Pulmonary:     Effort: Pulmonary effort is normal. No respiratory distress.     Breath sounds: Examination of the right-lower field reveals rales. Examination of the left-lower field reveals rales. Decreased breath sounds and rales present.  Chest:     Chest wall: Tenderness present.       Comments: Pleuraflex in place, wrapped.  Abdominal:     General: Bowel sounds are normal. There is no distension.     Palpations: Abdomen is soft.     Tenderness: There is no abdominal tenderness.  Musculoskeletal:        General: No tenderness or deformity.     Right lower leg: 1+ Edema present.     Left lower leg: 1+ Edema present.  Skin:    General: Skin is warm and dry.  Neurological:     Mental Status: She is alert and oriented to person, place, and time.      ED Treatments / Results  Labs (all labs  ordered are listed, but only abnormal results are displayed) Labs Reviewed  BASIC METABOLIC PANEL - Abnormal; Notable for the following components:      Result Value   Potassium 3.2 (*)    BUN 5 (*)    All other components within normal limits  CBC - Abnormal; Notable for the following components:   WBC 3.7 (*)    RBC 3.43 (*)    Hemoglobin 10.7 (*)    HCT 32.1 (*)    All other components within normal limits  SARS CORONAVIRUS 2 (HOSPITAL ORDER, [facility])  TROPONIN I  URINALYSIS, ROUTINE W REFLEX MICROSCOPIC  BRAIN NATRIURETIC PEPTIDE    EKG EKG Interpretation  Date/Time:  Friday January 27 2019 16:12:09 EDT Ventricular Rate:  77 PR Interval:  118 QRS Duration: 88 QT Interval:  386 QTC Calculation: 436 R Axis:   38 Text Interpretation:  Normal sinus rhythm Normal ECG When compared to prior, no significant changes seen,  No STEMI Confirmed by Antony Blackbird [phone]) on 01/27/2019 7:57:23 PM   Radiology Dg Chest 2 View  Result Date: 01/27/2019 CLINICAL DATA:  Chest pain EXAM: CHEST - 2 VIEW COMPARISON:  CT chest 01/06/2019 FINDINGS: Left-sided chest tube is present. Small left pleural effusion. Left basilar airspace disease and mild left suprahilar airspace disease. No pneumothorax. Stable cardiomediastinal silhouette. No aggressive osseous lesion. IMPRESSION: 1. Left-sided chest tube without a pneumothorax. Small left pleural effusion. Left basilar airspace disease and left suprahilar airspace disease most concerning for atelectasis versus pneumonia. Electronically Signed   By: Kathreen Devoid   On: 01/27/2019 17:58    Procedures Procedures (including critical care time)  Medications Ordered in ED Medications  cefTRIAXone (ROCEPHIN) 1 g in sodium chloride 0.9 % 100 mL IVPB (1 g Intravenous New Bag/Given 01/27/19 2042)  azithromycin (ZITHROMAX) 500 mg in sodium chloride 0.9 %  250 mL IVPB (has no administration in time range)  sodium chloride flush (NS)  0.9 % injection 3 mL (3 mLs Intravenous Given 01/27/19 2001)     Initial Impression / Assessment and Plan / ED Course  I have reviewed the triage vital signs and the nursing notes.  Pertinent labs & imaging results that were available during my care of the patient were reviewed by me and considered in my medical decision making (see chart for details).    Patient with a previous history of stage IV lung cancer, diagnosed in January with now mets to the brain per notes on March 2020.  With increased work of breathing, shortness of breath since this morning.  Reports standing at around 80s while at home, placed herself on oxygen with some improvement.  She reports some chest left pleuritic pain worse with deep inspiration.  Patient was placed on 2 L nasal cannula which brought her oxygen saturation to 100%.  Patient does have pleural flex in place to the left side.  Patient reports she has not left her house recently, has been home since her diagnosis in January.  Reports walking outside twice during the past 2 weeks but no known contact with any COVID positive patient.  During evaluation patient has some rhinorrhea, decreased breath sounds to lower lung fields, satting at 100% on 2 L nasal cannula BMP showed slight decrease in potassium at 3.2, creatinine is within normal limits.  CBC showed slight decrease in white blood cell count, hemoglobin is 10.7 creased by 4 since patient's last visit 2 weeks ago.  First troponin was negative, chest x-ray showed: 1. Left-sided chest tube without a pneumothorax. Small left pleural  effusion. Left basilar airspace disease and left suprahilar airspace  disease most concerning for atelectasis versus pneumonia.   We will start patient on Rocephin and azithromycin for community-acquired pneumonia.  Will place call for hospitalist admission due to new oxygen requirement.   8:54 PM spoke to hospitalist who will add on code at 19 testing.  Patient is to be admitted  for further treatment.  Portions of this note were generated with Lobbyist. Dictation errors may occur despite best attempts at proofreading.   Final Clinical Impressions(s) / ED Diagnoses   Final diagnoses:  Community acquired pneumonia of left upper lobe of lung ([place])  SOB (shortness of breath)    ED Discharge Orders    None       Janeece Fitting, PA-C 01/27/19 2054    Tegeler, Gwenyth Allegra, MD 01/28/19 [phone]

## 2019-01-27 NOTE — ED Notes (Signed)
Patient has a pleurex in, drained today, scant amount per patient.

## 2019-01-27 NOTE — ED Notes (Signed)
Attempted report x1. 

## 2019-01-27 NOTE — ED Notes (Signed)
Pt was placed on nasal canula at 1 L and stats are now at 100%.

## 2019-01-27 NOTE — Telephone Encounter (Signed)
Received call from patient's husband, Legrand Como. He states his wife called him at work to come home as she was feeling extremely SOB.  He arrived home to find her sitting upright  in recliner having difficulty breathing easily. She was tearful and quite anxious.  He put oxygen on her , which only helped a very little bit. She has a pleuryx drain which is being drianed at home a couple of times a week for very little pleural fluid. Pt also has a h/o PE. Husband is not sure what is going on. And seeking advice. Instructed him to take pt to ED at [facility] in case she needs scans done. Scans are being done @ [facility] now d/t Covid 19 situation. Legrand Como voiced understanding and will take patient to ED

## 2019-01-27 NOTE — H&P (Signed)
History and Physical    Tina Cruz IDP:[id number] DOB: [birth year] DOA: 01/27/2019  PCP: [name], MD  Patient coming from: Home  I have personally briefly reviewed patient's old medical records in [place]  Chief Complaint: SOB  HPI: Tina Cruz is a 58 y.o. female with medical history significant of stage 4 NSCLC currently on Tagrisso, PE last month, started on eliquis, recurrent L pleural effusion s/p plurex catheter.  Patient presents to the ED after waking up this AM with new onset SOB.  Worse with activity or ambulation.  Improved when O2 of 2L started at home.  She does not normally wear O2 at baseline.  She reports dry cough for past 1 week.  No fevers no chills.  CP L sided, worse with deep inspiration.  Drained plurex catheter this AM but only small amount of output.   ED Course: Trop neg, Satting in 80s on RA, but 100% on 2L.  CXR shows LLL opacity.  WBC 3.4k, no fever, no tachycardia, EKG grossly unchanged from prior.  Patient given rocephin / azithro empirically for possible CAP.   Review of Systems: As per HPI otherwise 10 point review of systems negative.   Past Medical History:  Diagnosis Date  . Active smoker    quit 2016  . Cancer ([place])    left lung stage IV non-cell carcinoma/adenocarcinoma with diagnosis made from left pleural effusion.    . Cancer ([place])    Brain metastasis   . Constipation   . Depression   . Dyspnea   . GERD (gastroesophageal reflux disease)   . H/O chronic bronchitis   . Hypothyroidism   . Seasonal allergies   . SVD (spontaneous vaginal delivery)    x 2  . Thyroid disease    hypothyroid  . Wears partial dentures     Past Surgical History:  Procedure Laterality Date  . COLONOSCOPY     x 2  . IR PERC PLEURAL DRAIN W/INDWELL CATH W/IMG GUIDE  01/06/2019  . TONSILLECTOMY    . TUBAL LIGATION       reports that she quit smoking about 3 years ago. Her smoking use included cigarettes. She has a 2.50  pack-year smoking history. She has never used smokeless tobacco. She reports that she does not drink alcohol or use drugs.  Allergies  Allergen Reactions  . Albuterol Hives and Itching    Family History  Problem Relation Age of Onset  . Cancer Mother        colon  . Alcohol abuse Father      Prior to Admission medications   Medication Sig Start Date End Date Taking? Authorizing Provider  acetaminophen (TYLENOL) 325 MG tablet Take 325 mg by mouth every 6 (six) hours as needed for mild pain or headache.   Yes [provider]  apixaban (ELIQUIS) 5 MG TABS tablet Take 5 mg by mouth 2 (two) times daily.  01/08/19  Yes [provider]  azelastine (ASTELIN) 0.1 % nasal spray Place 1 spray into both nostrils 2 (two) times daily as needed for rhinitis. Use in each nostril as directed Patient taking differently: Place 1 spray into both nostrils 2 (two) times daily as needed for rhinitis.  11/02/18  Yes Delsa Grana, PA-C  Cetirizine HCl (ZYRTEC ALLERGY) 10 MG CAPS Take 1 capsule (10 mg total) by mouth daily as needed (allergies). Patient taking differently: Take 1 capsule by mouth daily.  07/27/18  Yes Dena Billet B, PA-C  famotidine (  PEPCID) 20 MG tablet Take 20 mg by mouth daily as needed for heartburn.    Yes [provider]  levalbuterol (XOPENEX HFA) 45 MCG/ACT inhaler Inhale 1-2 puffs into the lungs every 4 (four) hours as needed for wheezing or shortness of breath. 09/27/18  Yes Delsa Grana, PA-C  levothyroxine (SYNTHROID) 112 MCG tablet Take 1 tablet (112 mcg total) by mouth daily before breakfast. 01/27/19  Yes [place], Modena Nunnery, MD  LORazepam (ATIVAN) 1 MG tablet Take 1 tablet about 30 min before wearing mask for brain treatment. Must have a driver after taking this. Take 1 tab PRN before MRIs in the future, if feeling anxious. Patient taking differently: Take 1 mg by mouth See admin instructions. Take 1 mg by mouth approx 30 minutes before wearing mask for brain  treatment/must have a driver after taking this med. Also, take 1 mg by mouth as needed before any future MRI(s) as needed for anxiousness 12/27/18  Yes Eppie Gibson, MD  osimertinib mesylate (TAGRISSO) 80 MG tablet Take 1 tablet (80 mg total) by mouth daily. 01/23/19  Yes [name], MD  magnesium citrate SOLN Take 0.5 Bottles by mouth daily as needed for moderate constipation or severe constipation.    [provider]  polyethylene glycol (MIRALAX / GLYCOLAX) packet Take 17 g by mouth daily.     [provider]  sucralfate (CARAFATE) 1 GM/10ML suspension Take 10 mLs (1 g total) by mouth 4 (four) times daily -  with meals and at bedtime. Patient not taking: Reported on 01/27/2019 01/10/19   Gery Pray, MD    Physical Exam: Vitals:   01/27/19 1827 01/27/19 2001 01/27/19 2030 01/27/19 2100  BP: (!) 103/57 121/68 127/72 119/63  Pulse: 86 84 80 82  Resp: 18 19 20 18   Temp:      TempSrc:      SpO2: 99% 100% 100% 100%    Constitutional: NAD, calm, comfortable Eyes: PERRL, lids and conjunctivae normal ENMT: Mucous membranes are moist. Posterior pharynx clear of any exudate or lesions.Normal dentition.  Neck: normal, supple, no masses, no thyromegaly Respiratory: Diminished L base Cardiovascular: Regular rate and rhythm, no murmurs / rubs / gallops. No extremity edema. 2+ pedal pulses. No carotid bruits.  Abdomen: no tenderness, no masses palpated. No hepatosplenomegaly. Bowel sounds positive.  Musculoskeletal: no clubbing / cyanosis. No joint deformity upper and lower extremities. Good ROM, no contractures. Normal muscle tone.  Skin: no rashes, lesions, ulcers. No induration Neurologic: CN 2-12 grossly intact. Sensation intact, DTR normal. Strength 5/5 in all 4.  Psychiatric: Normal judgment and insight. Alert and oriented x 3. Normal mood.    Labs on Admission: I have personally reviewed following labs and imaging studies  CBC: Recent Labs  Lab 01/27/19 1714   WBC 3.7*  HGB 10.7*  HCT 32.1*  MCV 93.6  PLT 250   Basic Metabolic Panel: Recent Labs  Lab 01/27/19 1714  NA 142  K 3.2*  CL 107  CO2 29  GLUCOSE 87  BUN 5*  CREATININE 0.65  CALCIUM 8.9   GFR: CrCl cannot be calculated (Unknown ideal weight.). Liver Function Tests: No results for input(s): AST, ALT, ALKPHOS, BILITOT, PROT, ALBUMIN in the last 168 hours. No results for input(s): LIPASE, AMYLASE in the last 168 hours. No results for input(s): AMMONIA in the last 168 hours. Coagulation Profile: No results for input(s): INR, PROTIME in the last 168 hours. Cardiac Enzymes: Recent Labs  Lab 01/27/19 1714  TROPONINI <0.03   BNP (  last 3 results) No results for input(s): PROBNP in the last 8760 hours. HbA1C: No results for input(s): HGBA1C in the last 72 hours. CBG: No results for input(s): GLUCAP in the last 168 hours. Lipid Profile: No results for input(s): CHOL, HDL, LDLCALC, TRIG, CHOLHDL, LDLDIRECT in the last 72 hours. Thyroid Function Tests: No results for input(s): TSH, T4TOTAL, FREET4, T3FREE, THYROIDAB in the last 72 hours. Anemia Panel: No results for input(s): VITAMINB12, FOLATE, FERRITIN, TIBC, IRON, RETICCTPCT in the last 72 hours. Urine analysis:    Component Value Date/Time   COLORURINE YELLOW 01/27/2019 2021   APPEARANCEUR CLEAR 01/27/2019 2021   LABSPEC 1.017 01/27/2019 2021   PHURINE 5.0 01/27/2019 2021   GLUCOSEU NEGATIVE 01/27/2019 2021   HGBUR NEGATIVE 01/27/2019 2021   BILIRUBINUR NEGATIVE 01/27/2019 2021   KETONESUR 5 (A) 01/27/2019 2021   PROTEINUR NEGATIVE 01/27/2019 2021   NITRITE NEGATIVE 01/27/2019 2021   LEUKOCYTESUR NEGATIVE 01/27/2019 2021    Radiological Exams on Admission: Dg Chest 2 View  Result Date: 01/27/2019 CLINICAL DATA:  Chest pain EXAM: CHEST - 2 VIEW COMPARISON:  CT chest 01/06/2019 FINDINGS: Left-sided chest tube is present. Small left pleural effusion. Left basilar airspace disease and mild left suprahilar  airspace disease. No pneumothorax. Stable cardiomediastinal silhouette. No aggressive osseous lesion. IMPRESSION: 1. Left-sided chest tube without a pneumothorax. Small left pleural effusion. Left basilar airspace disease and left suprahilar airspace disease most concerning for atelectasis versus pneumonia. Electronically Signed   By: Kathreen Devoid   On: 01/27/2019 17:58    EKG: Independently reviewed.  Assessment/Plan Principal Problem:   Acute respiratory failure with hypoxia (HCC) Active Problems:   Adenocarcinoma of left lung, stage 4 (HCC)   Recurrent left pleural effusion   Pulmonary embolism (HCC)   CAP (community acquired pneumonia)    1. Acute resp fail with hypoxia, LLL pulmonary opacity, 2L o2 requirement - 1. DDx includes CAP, COVID, recurrent PE, and pulmonary toxicity from [place] 2. CTA PE study to help differentiate 2. Possible CAP - 1. PNA pathway 2. Rocephin and azithromycin empirically 3. Checking COVID 4. Checking RVP 5. Sputum Cx 6. BCx 7. Xopenex PRN 3. PE - had PE on 3/27 1. Possible recurrent PE, though she is on Eliquis with no missed doses 2. Will evaluate with CTA PE study 3. For now continue eliquis 4. NSCLC - 1. Holding Tagrisso until oncology can weigh in tomorrow AM (she already took this afternoons dose today it seems). 2. CTA as above to look for ILD 5. L pleurex catheter - 1. Wound care consult to manage drainage  DVT prophylaxis: Eliquis - just took tonights dose Code Status: Full code Family Communication: No family in room Disposition Plan: Home after admit Consults called: Consult put into Epic for Onc in AM Admission status: Admit to inpatient  Severity of Illness: The appropriate patient status for this patient is INPATIENT. Inpatient status is judged to be reasonable and necessary in order to provide the required intensity of service to ensure the patient's safety. The patient's presenting symptoms, physical exam findings, and  initial radiographic and laboratory data in the context of their chronic comorbidities is felt to place them at high risk for further clinical deterioration. Furthermore, it is not anticipated that the patient will be medically stable for discharge from the hospital within 2 midnights of admission. The following factors support the patient status of inpatient.   " The patient's presenting symptoms include SOB, pleuritic CP. " The worrisome physical exam findings include new  2L O2 requirement. " The initial radiographic and laboratory data are worrisome because of LLL opacity on CXR. " The chronic co-morbidities include PE last month, stage 4 NSCLC on Tagrisso.   * I certify that at the point of admission it is my clinical judgment that the patient will require inpatient hospital care spanning beyond 2 midnights from the point of admission due to high intensity of service, high risk for further deterioration and high frequency of surveillance required.*    ,  M. DO [facility]  How to contact the [facility] Attending or Consulting provider [place] or covering provider during after hours [place], for this patient?  1. Check the care team in [facility] and look for a) attending/consulting [facility] provider listed and b) the [facility] team listed 2. Log into [email or web address]  Amion Physician Scheduling and messaging for groups and whole hospitals  On call and physician scheduling software for group practices, residents, hospitalists and other medical providers for call, clinic, rotation and shift schedules. OnCall Enterprise is a hospital-wide system for scheduling doctors and paging doctors on call. EasyPlot is for scientific plotting and data analysis.  [email or web address]  and use [place] universal password to access. If you do not have the password, please contact the hospital operator.  3. Locate the [facility] provider you are looking for under [facility] and page to a number that you can be directly  reached. 4. If you still have difficulty reaching the provider, please page the [facility] (Director on Call) for the Hospitalists listed on amion for assistance.  01/27/2019, 9:25 PM

## 2019-01-27 NOTE — ED Notes (Signed)
Admitting MD at bedside.

## 2019-01-27 NOTE — ED Notes (Addendum)
ED TO INPATIENT HANDOFF REPORT  ED Nurse Name and Phone #:  Gretta Cool [phone]  S Name/Age/Gender Tina Cruz 58 y.o. female Room/Bed: 027C/027C  Code Status   Code Status: Full Code  Home/SNF/Other Home Patient oriented to: CAOx4 Is this baseline? Yes   Triage Complete: Triage complete  Chief Complaint SOB; Stage 4 Lung Cancer Pt  Triage Note Pt reports waking up this morning and felt unable to breathe. Upon arrival to the ED pt was using her family members oxygen tank at 2L [place] and was 89%. Pt does not use oxygen normally. Pt oxygen levels eventually raised to 100% and pt being weaned down. 100% on 0.5L [place] at this time. Also c/o a "slight twinge" of pain to her chest.    Allergies Allergies  Allergen Reactions  . Albuterol Hives and Itching    Level of Care/Admitting Diagnosis ED Disposition    ED Disposition Condition [place] [facility] Area: [place] [[id number]]  Level of Care: Med-Surg [16]  Covid Evaluation: N/A  Diagnosis: CAP (community acquired pneumonia) [462703]  Admitting Physician: Etta Quill [phone]  Attending Physician: Etta Quill [phone]  Estimated length of stay: past midnight tomorrow  Certification:: I certify this patient will need inpatient services for at least 2 midnights  PT Class (Do Not Modify): Inpatient [101]  PT Acc Code (Do Not Modify): Private [1]       B Medical/Surgery History Past Medical History:  Diagnosis Date  . Active smoker    quit 2016  . Cancer ([place])    left lung stage IV non-cell carcinoma/adenocarcinoma with diagnosis made from left pleural effusion.    . Cancer ([place])    Brain metastasis   . Constipation   . Depression   . Dyspnea   . GERD (gastroesophageal reflux disease)   . H/O chronic bronchitis   . Hypothyroidism   . Seasonal allergies   . SVD (spontaneous vaginal delivery)    x 2  . Thyroid disease    hypothyroid  . Wears partial dentures    Past Surgical  History:  Procedure Laterality Date  . COLONOSCOPY     x 2  . IR PERC PLEURAL DRAIN W/INDWELL CATH W/IMG GUIDE  01/06/2019  . TONSILLECTOMY    . TUBAL LIGATION       A IV Location/Drains/Wounds Patient Lines/Drains/Airways Status   Active Line/Drains/Airways    Name:   Placement date:   Placement time:   Site:   Days:   Peripheral IV 01/27/19 Right Forearm   01/27/19    2000    Forearm   less than 1   Closed System Drain 1 Left;Lateral Abdomen Other (Comment) 15.5 Fr.   01/06/19    -    Abdomen   21          Intake/Output Last 24 hours No intake or output data in the 24 hours ending 01/27/19 2303  Labs/Imaging Results for orders placed or performed during the hospital encounter of 01/27/19 (from the past 48 hour(s))  Basic metabolic panel     Status: Abnormal   Collection Time: 01/27/19  5:14 PM  Result Value Ref Range   Sodium 142 135 - 145 mmol/L   Potassium 3.2 (L) 3.5 - 5.1 mmol/L   Chloride 107 98 - 111 mmol/L   CO2 29 22 - 32 mmol/L   Glucose, Bld 87 70 - 99 mg/dL   BUN 5 (L) 6 - 20 mg/dL   Creatinine,  Ser 0.65 0.44 - 1.00 mg/dL   Calcium 8.9 8.9 - 10.3 mg/dL   GFR calc non Af Amer >60 >60 mL/min   GFR calc Af Amer >60 >60 mL/min   Anion gap 6 5 - 15    Comment: Performed at [facility] [address] [place], [place] [address]  CBC     Status: Abnormal   Collection Time: 01/27/19  5:14 PM  Result Value Ref Range   WBC 3.7 (L) 4.0 - 10.5 K/uL   RBC 3.43 (L) 3.87 - 5.11 MIL/uL   Hemoglobin 10.7 (L) 12.0 - 15.0 g/dL   HCT 32.1 (L) 36.0 - 46.0 %   MCV 93.6 80.0 - 100.0 fL   MCH 31.2 26.0 - 34.0 pg   MCHC 33.3 30.0 - 36.0 g/dL   RDW 14.8 11.5 - 15.5 %   Platelets 153 150 - 400 K/uL   nRBC 0.0 0.0 - 0.2 %    Comment: Performed at [facility], [place] [address], [place], [place] [address]  Troponin I - ONCE - STAT     Status: None   Collection Time: 01/27/19  5:14 PM  Result Value Ref Range   Troponin I <0.03 <0.03 ng/mL    Comment: Performed at  [facility] [address], [place], [place] [address]  Urinalysis, Routine w reflex microscopic     Status: Abnormal   Collection Time: 01/27/19  8:21 PM  Result Value Ref Range   Color, Urine YELLOW YELLOW   APPearance CLEAR CLEAR   Specific Gravity, Urine 1.017 1.005 - 1.030   pH 5.0 5.0 - 8.0   Glucose, UA NEGATIVE NEGATIVE mg/dL   Hgb urine dipstick NEGATIVE NEGATIVE   Bilirubin Urine NEGATIVE NEGATIVE   Ketones, ur 5 (A) NEGATIVE mg/dL   Protein, ur NEGATIVE NEGATIVE mg/dL   Nitrite NEGATIVE NEGATIVE   Leukocytes,Ua NEGATIVE NEGATIVE    Comment: Performed at [facility] [address], [place], [place] [address]  Brain natriuretic peptide     Status: None   Collection Time: 01/27/19  8:43 PM  Result Value Ref Range   B Natriuretic Peptide 29.5 0.0 - 100.0 pg/mL    Comment: Performed at [facility] [address], [place], [place] [address]  SARS Coronavirus 2 [facility] order, Performed in [facility] hospital lab)     Status: None   Collection Time: 01/27/19  9:06 PM  Result Value Ref Range   SARS Coronavirus 2 NEGATIVE NEGATIVE    Comment: (NOTE) If result is NEGATIVE SARS-CoV-2 target nucleic acids are NOT DETECTED. The SARS-CoV-2 RNA is generally detectable in upper and lower  respiratory specimens during the acute phase of infection. The lowest  concentration of SARS-CoV-2 viral copies this assay can detect is 250  copies / mL. A negative result does not preclude SARS-CoV-2 infection  and should not be used as the sole basis for treatment or other  patient management decisions.  A negative result may occur with  improper specimen collection / handling, submission of specimen other  than nasopharyngeal swab, presence of viral mutation(s) within the  areas targeted by this assay, and inadequate number of viral copies  (<250 copies / mL). A negative result must be combined with clinical  observations, patient history, and epidemiological  information. If result is POSITIVE SARS-CoV-2 target nucleic acids are DETECTED. The SARS-CoV-2 RNA is generally detectable in upper and lower  respiratory specimens dur ing the acute phase  of infection.  Positive  results are indicative of active infection with SARS-CoV-2.  Clinical  correlation with patient history and other diagnostic information is  necessary to determine patient infection status.  Positive results do  not rule out bacterial infection or co-infection with other viruses. If result is PRESUMPTIVE POSTIVE SARS-CoV-2 nucleic acids MAY BE PRESENT.   A presumptive positive result was obtained on the submitted specimen  and confirmed on repeat testing.  While 2019 novel coronavirus  (SARS-CoV-2) nucleic acids may be present in the submitted sample  additional confirmatory testing may be necessary for epidemiological  and / or clinical management purposes  to differentiate between  SARS-CoV-2 and other [place]ovirus currently known to infect humans.  If clinically indicated additional testing with an alternate test  methodology [phone]) is advised. The SARS-CoV-2 RNA is generally  detectable in upper and lower respiratory sp ecimens during the acute  phase of infection. The expected result is Negative. Fact Sheet for Patients:  StrictlyIdeas.no Fact Sheet for Healthcare Providers: BankingDealers.co.za This test is not yet approved or cleared by the [place] FDA and has been authorized for detection and/or diagnosis of SARS-CoV-2 by FDA under an Emergency Use Authorization (EUA).  This EUA will remain in effect (meaning this test can be used) for the duration of the COVID-19 declaration under Section 564(b)(1) of the Act, 21 U.S.C. section 360bbb-3(b)(1), unless the authorization is terminated or revoked sooner. Performed at [facility], [place] [address] [place], [place] [address]    Dg Chest 2 View  Result  Date: 01/27/2019 CLINICAL DATA:  Chest pain EXAM: CHEST - 2 VIEW COMPARISON:  CT chest 01/06/2019 FINDINGS: Left-sided chest tube is present. Small left pleural effusion. Left basilar airspace disease and mild left suprahilar airspace disease. No pneumothorax. Stable cardiomediastinal silhouette. No aggressive osseous lesion. IMPRESSION: 1. Left-sided chest tube without a pneumothorax. Small left pleural effusion. Left basilar airspace disease and left suprahilar airspace disease most concerning for atelectasis versus pneumonia. Electronically Signed   By: Kathreen Devoid   On: 01/27/2019 17:58    Pending Labs Unresulted Labs (From admission, onward)    Start     Ordered   01/28/19 0500  CBC  Tomorrow morning,   R     01/27/19 2059   01/28/19 0500  Comprehensive metabolic panel  Tomorrow morning,   R     01/27/19 2059   01/27/19 2102  Respiratory Panel by PCR  (Respiratory virus panel with precautions)  Once,   R     01/27/19 2101   01/27/19 2051  Culture, blood (routine x 2) Call MD if unable to obtain prior to antibiotics being given  BLOOD CULTURE X 2,   R    Comments:  If blood cultures drawn in Emergency Department - Do not draw and cancel order    01/27/19 2059   01/27/19 2051  Culture, sputum-assessment  Once,   R     01/27/19 2059   01/27/19 2051  Gram stain  Once,   R     01/27/19 2059   01/27/19 2051  HIV antibody (Routine Screening)  Once,   R     01/27/19 2059   01/27/19 2051  Strep pneumoniae urinary antigen  Once,   R     01/27/19 2059          Vitals/Pain Today's Vitals   01/27/19 2030 01/27/19 2100 01/27/19 2130 01/27/19 2200  BP: 127/72 119/63 132/70 116/66  Pulse: 80 82 87 84  Resp: 20 18 20 19   Temp:      TempSrc:      SpO2: 100% 100% 100% 100%  PainSc:        Isolation Precautions No active isolations  Medications Medications  cefTRIAXone (ROCEPHIN) 1 g in sodium chloride 0.9 % 100 mL IVPB (has no administration in time range)  azithromycin (ZITHROMAX)  tablet 500 mg (has no administration in time range)  apixaban (ELIQUIS) tablet 5 mg (5 mg Oral Given by Other 01/27/19 2110)  azelastine (ASTELIN) 0.1 % nasal spray 1 spray (has no administration in time range)  albuterol (VENTOLIN HFA) 108 (90 Base) MCG/ACT inhaler 1-2 puff (has no administration in time range)  levothyroxine (SYNTHROID) tablet 112 mcg (has no administration in time range)  famotidine (PEPCID) tablet 20 mg (has no administration in time range)  acetaminophen (TYLENOL) tablet 325 mg (has no administration in time range)  sodium chloride flush (NS) 0.9 % injection 3 mL (3 mLs Intravenous Given 01/27/19 2001)  cefTRIAXone (ROCEPHIN) 1 g in sodium chloride 0.9 % 100 mL IVPB (0 g Intravenous Stopped 01/27/19 2112)  azithromycin (ZITHROMAX) 500 mg in sodium chloride 0.9 % 250 mL IVPB (0 mg Intravenous Stopped 01/27/19 2303)  potassium chloride SA (K-DUR) CR tablet 40 mEq (40 mEq Oral Given 01/27/19 2118)    Mobility walks Low fall risk   Focused Assessments Cardiac Assessment Handoff: NSR on monitor  Cardiac Rhythm: Normal sinus rhythm Lab Results  Component Value Date   TROPONINI <0.03 01/27/2019   No results found for: DDIMER Does the Patient currently have chest pain? No  , Pulmonary Assessment Handoff: cough, dry  Lung sounds:   O2 Device: Nasal Cannula O2 Flow Rate (L/min): 1 L/min      R Recommendations: See Admitting Provider Note  Report given to:   Additional Notes:  Antibiotics finished, Covid Negative

## 2019-01-27 NOTE — ED Notes (Signed)
Patient took Eliquis 5mg  from home supply.

## 2019-01-27 NOTE — Telephone Encounter (Signed)
Oral Oncology Pharmacist Encounter  Confirmed with [facility] team (ph: [phone]) that patient's next fill of Tagriiso has been processed and is shipping out to the patient today. There are 2 refill on current prescription at dispensing pharmacy.  Johny Drilling, PharmD, BCPS, BCOP  01/27/2019 9:35 AM Oral Oncology Clinic [phone]

## 2019-01-27 NOTE — Telephone Encounter (Signed)
Tina Cruz called today stating that his wife has been having increased shortness of breath and dry cough overnight. She does not have any fevers He drained her pleurX and only got around 20 cc's. He states that her draining amounts have been decreasing over the past 2 weeks. He had a family member that had oxygen, so he put it on his wife at 2 liters and that seems to be helping. I recommended that he call her Oncologist about the increased SOB, I do no feel like it's related to her pleural effusion. She has stage 4 lung cancer. I told him if she develops a fever or the shortness of breath gets worse to please to the ED. I will inform Dr Prescott Gum about the call.

## 2019-01-28 ENCOUNTER — Other Ambulatory Visit: Payer: Self-pay

## 2019-01-28 ENCOUNTER — Encounter (HOSPITAL_COMMUNITY): Payer: Self-pay

## 2019-01-28 LAB — COMPREHENSIVE METABOLIC PANEL
ALT: 15 U/L (ref 0–44)
AST: 16 U/L (ref 15–41)
Albumin: 2.8 g/dL — ABNORMAL LOW (ref 3.5–5.0)
Alkaline Phosphatase: 153 U/L — ABNORMAL HIGH (ref 38–126)
Anion gap: 11 (ref 5–15)
BUN: <5 mg/dL — ABNORMAL LOW (ref 6–20)
CO2: 23 mmol/L (ref 22–32)
Calcium: 8.8 mg/dL — ABNORMAL LOW (ref 8.9–10.3)
Chloride: 108 mmol/L (ref 98–111)
Creatinine, Ser: 0.62 mg/dL (ref 0.44–1.00)
GFR calc Af Amer: >60 mL/min (ref >60)
GFR calc non Af Amer: >60 mL/min (ref >60)
Glucose, Bld: 102 mg/dL — ABNORMAL HIGH (ref 70–99)
Potassium: 3.8 mmol/L (ref 3.5–5.1)
Sodium: 142 mmol/L (ref 135–145)
Total Bilirubin: 0.8 mg/dL (ref 0.3–1.2)
Total Protein: 5.1 g/dL — ABNORMAL LOW (ref 6.5–8.1)

## 2019-01-28 LAB — RESPIRATORY PANEL BY PCR

## 2019-01-28 LAB — CBC
HCT: 30 % — ABNORMAL LOW (ref 36.0–46.0)
Hemoglobin: 10 g/dL — ABNORMAL LOW (ref 12.0–15.0)
MCH: 30.9 pg (ref 26.0–34.0)
MCHC: 33.3 g/dL (ref 30.0–36.0)
MCV: 92.6 fL (ref 80.0–100.0)
Platelets: 138 10*3/uL — ABNORMAL LOW (ref 150–400)
RBC: 3.24 MIL/uL — ABNORMAL LOW (ref 3.87–5.11)
RDW: 14.8 % (ref 11.5–15.5)
WBC: 3.9 10*3/uL — ABNORMAL LOW (ref 4.0–10.5)
nRBC: 0 % (ref 0.0–0.2)

## 2019-01-28 LAB — HIV ANTIBODY (ROUTINE TESTING W REFLEX): HIV Screen 4th Generation wRfx: NONREACTIVE

## 2019-01-28 LAB — STREP PNEUMONIAE URINARY ANTIGEN: Strep Pneumo Urinary Antigen: NEGATIVE

## 2019-01-28 MED ORDER — LORATADINE 10 MG PO TABS
10.0000 mg | ORAL_TABLET | Freq: Every day | ORAL | Status: DC
  Administered 2019-01-28 – 2019-01-30 (×3): 10 mg via ORAL
  Filled 2019-01-28 (×3): qty 1

## 2019-01-28 MED ORDER — DIPHENHYDRAMINE HCL 50 MG/ML IJ SOLN
25.0000 mg | Freq: Three times a day (TID) | INTRAMUSCULAR | Status: DC
  Administered 2019-01-28 – 2019-01-29 (×2): 25 mg via INTRAVENOUS
  Filled 2019-01-28 (×2): qty 1

## 2019-01-28 MED ORDER — METHYLPREDNISOLONE SODIUM SUCC 40 MG IJ SOLR
40.0000 mg | Freq: Three times a day (TID) | INTRAMUSCULAR | Status: DC
  Administered 2019-01-28 – 2019-01-29 (×2): 40 mg via INTRAVENOUS
  Filled 2019-01-28 (×2): qty 1

## 2019-01-28 MED ORDER — LEVALBUTEROL HCL 0.63 MG/3ML IN NEBU: 0.6300 mg | INHALATION_SOLUTION | RESPIRATORY_TRACT | Status: DC | PRN

## 2019-01-28 MED ORDER — IPRATROPIUM-ALBUTEROL 0.5-2.5 (3) MG/3ML IN SOLN: 3.0000 mL | RESPIRATORY_TRACT | Status: DC | PRN

## 2019-01-28 MED ORDER — METHYLPREDNISOLONE SODIUM SUCC 40 MG IJ SOLR
40.0000 mg | Freq: Once | INTRAMUSCULAR | Status: AC
  Administered 2019-01-28: 40 mg via INTRAVENOUS
  Filled 2019-01-28: qty 1

## 2019-01-28 MED ORDER — PANTOPRAZOLE SODIUM 40 MG PO TBEC
40.0000 mg | DELAYED_RELEASE_TABLET | Freq: Every day | ORAL | Status: DC
  Administered 2019-01-28 – 2019-01-30 (×3): 40 mg via ORAL
  Filled 2019-01-28 (×3): qty 1

## 2019-01-28 MED ORDER — BUDESONIDE 0.25 MG/2ML IN SUSP
0.2500 mg | Freq: Two times a day (BID) | RESPIRATORY_TRACT | Status: DC
  Administered 2019-01-28: 09:00:00 0.25 mg via RESPIRATORY_TRACT
  Filled 2019-01-28: qty 2

## 2019-01-28 MED ORDER — FAMOTIDINE IN NACL 20-0.9 MG/50ML-% IV SOLN
20.0000 mg | Freq: Two times a day (BID) | INTRAVENOUS | Status: DC
  Administered 2019-01-28 – 2019-01-30 (×4): 20 mg via INTRAVENOUS
  Filled 2019-01-28 (×7): qty 50

## 2019-01-28 MED ORDER — IPRATROPIUM-ALBUTEROL 0.5-2.5 (3) MG/3ML IN SOLN
3.0000 mL | Freq: Four times a day (QID) | RESPIRATORY_TRACT | Status: DC
  Administered 2019-01-28: 09:00:00 3 mL via RESPIRATORY_TRACT

## 2019-01-28 MED ORDER — DIPHENHYDRAMINE HCL 25 MG PO CAPS: 25.0000 mg | ORAL_CAPSULE | Freq: Four times a day (QID) | ORAL | Status: DC | PRN

## 2019-01-28 MED ORDER — LEVALBUTEROL TARTRATE 45 MCG/ACT IN AERO: 1.0000 | INHALATION_SPRAY | RESPIRATORY_TRACT | Status: DC | PRN

## 2019-01-28 MED ORDER — DIPHENHYDRAMINE HCL 50 MG/ML IJ SOLN
25.0000 mg | Freq: Once | INTRAMUSCULAR | Status: AC
  Administered 2019-01-28: 25 mg via INTRAVENOUS
  Filled 2019-01-28: qty 1

## 2019-01-28 MED ORDER — ORAL CARE MOUTH RINSE
15.0000 mL | Freq: Two times a day (BID) | OROMUCOSAL | Status: DC
  Administered 2019-01-28 – 2019-01-30 (×5): 15 mL via OROMUCOSAL

## 2019-01-28 NOTE — Progress Notes (Addendum)
PROGRESS NOTE    Tina Cruz  ZTI:[id number] DOB: Dec 09, 1960 DOA: 01/27/2019 PCP: [name], MD    Brief Narrative:  HPI per Dr. Dayton Bailiff Yaremi Stahlman is a 58 y.o. female with medical history significant of stage 4 NSCLC currently on Tagrisso, PE last month, started on eliquis, recurrent L pleural effusion s/p plurex catheter.  Patient presented to the ED after waking up this AM with new onset SOB.  Worse with activity or ambulation.  Improved when O2 of 2L started at home.  She does not normally wear O2 at baseline.  She reports dry cough for past 1 week.  No fevers no chills.  CP L sided, worse with deep inspiration.  Drained plurex catheter this AM but only small amount of output.   ED Course: Trop neg, Satting in 80s on RA, but 100% on 2L.  CXR shows LLL opacity.  WBC 3.4k, no fever, no tachycardia, EKG grossly unchanged from prior.  Patient given rocephin / azithro empirically for possible CAP.    Assessment & Plan:   Principal Problem:   Acute respiratory failure with hypoxia (HCC) Active Problems:   Adenocarcinoma of left lung, stage 4 (HCC)   Recurrent left pleural effusion   Pulmonary embolism (HCC)   CAP (community acquired pneumonia)  1 acute respiratory failure with hypoxia secondary to probable left lower lobe infiltrate/increased O2 requirements Patient presented with increased O2 requirements, acute new onset shortness of breath worse with activity on ambulation which improved on 2 L O2.  Patient not on oxygen at baseline.  COVID-19 rapid test negative.  CT angiogram chest negative for PE.  Patient's Tagrisso on hold pending infection.  Shortness of breath improving per patient.  Patient initially placed on scheduled duo nebs which have been discontinued due to itching and probable hives.  Sputum Gram stain and culture pending.  Urine strep pneumococcus antigen negative.  Urine Legionella antigen pending.  Patient on Pulmicort.  Xopenex MDI as  needed.  Continue empiric IV Rocephin and azithromycin.  If continued improvement could likely transition to oral antibiotics in the next 1 to 2 days.  Supportive care.  2.  History of PE Patient recently diagnosed with PE 3/27.  CT angiogram chest with no acute PE identified, small residual chronic pulmonary embolus within a right lower lobe segmental pulmonary artery.  Continue anticoagulation with Eliquis.  Follow.  3.  Non-small cell lung cancer CT angiogram chest with no acute PE noted.  Left suprahilar mass appears decreased in size from prior CT of the chest.  Near complete resolution of postobstructive pneumonitis and improved aeration of the left lung.  Decrease size of left sided pleural effusion.  Residual loculated pleural fluid lateral to Pleurx catheter.  Continue to hold Tagrisso until antibiotics have been completed.  Outpatient follow-up with oncology.  Curbsided oncology on call.  4.  Left Pleurx catheter CT angiogram chest with decreased size of left-sided pleural effusion.  Residual loculated pleural fluid lateral to Pleurx catheter.  Continue Pleurx catheter drainage.  5.  Rash/hives/itching Per RN patient with diffuse splotchy rash with associated itching which per patient started after breathing treatment.  Likely allergic reaction to duo nebs as it is noted that patient does have an allergy to albuterol which causes hives and itching from 2014.  Patient received antibiotics last dose around 8 PM on 05/29/2019 and as such doubt this is the etiology of her rash and itching.  Discontinue duo nebs.  Benadryl 25 mg IV x1, Pepcid  20 mg IV every 12 hours, Solu-Medrol 40 mg IV x1.  Follow.   DVT prophylaxis: Eliquis Code Status: Full Family Communication: Updated patient.  No family at bedside. Disposition Plan: Likely home when clinically improved with resolution of hypoxia.   Consultants:   Curb sided oncology.  Procedures:   CT angiogram chest 01/27/2019  Chest x-ray  01/27/2019  Antimicrobials:   IV Rocephin 01/27/2019>>>>  IV azithromycin 01/27/2019>>>>>   Subjective: Patient laying in bed.  Shortness of breath has improved since admission.  Denies any chest pain.  No abdominal pain.  Objective: Vitals:   01/27/19 2300 01/27/19 2358 01/28/19 0538 01/28/19 0900  BP: (!) 142/72 126/63 106/62   Pulse: 98 (!) 102 92   Resp: (!) 21 18 18    Temp:  99.5 F (37.5 C) 99.1 F (37.3 C)   TempSrc:  Oral Oral   SpO2: 100% 96% 100% 99%  Weight: 74.6 kg     Height: 5\' 7"  (1.702 m)      No intake or output data in the 24 hours ending 01/28/19 1250 Filed Weights   01/27/19 2300  Weight: 74.6 kg    Examination:  General exam: Appears calm and comfortable  Respiratory system: Clear to auscultation. Respiratory effort normal. Cardiovascular system: S1 & S2 heard, RRR. No JVD, murmurs, rubs, gallops or clicks. No pedal edema. Gastrointestinal system: Abdomen is nondistended, soft and nontender. No organomegaly or masses felt. Normal bowel sounds heard. Central nervous system: Alert and oriented. No focal neurological deficits. Extremities: Symmetric 5 x 5 power. Skin: No rashes, lesions or ulcers Psychiatry: Judgement and insight appear normal. Mood & affect appropriate.     Data Reviewed: I have personally reviewed following labs and imaging studies  CBC: Recent Labs  Lab 01/27/19 1714 01/28/19 0340  WBC 3.7* 3.9*  HGB 10.7* 10.0*  HCT 32.1* 30.0*  MCV 93.6 92.6  PLT 153 818*   Basic Metabolic Panel: Recent Labs  Lab 01/27/19 1714 01/28/19 0340  NA 142 142  K 3.2* 3.8  CL 107 108  CO2 29 23  GLUCOSE 87 102*  BUN 5* <5*  CREATININE 0.65 0.62  CALCIUM 8.9 8.8*   GFR: Estimated Creatinine Clearance: 81.8 mL/min (by C-G formula based on SCr of 0.62 mg/dL). Liver Function Tests: Recent Labs  Lab 01/28/19 0340  AST 16  ALT 15  ALKPHOS 153*  BILITOT 0.8  PROT 5.1*  ALBUMIN 2.8*   No results for input(s): LIPASE, AMYLASE in  the last 168 hours. No results for input(s): AMMONIA in the last 168 hours. Coagulation Profile: No results for input(s): INR, PROTIME in the last 168 hours. Cardiac Enzymes: Recent Labs  Lab 01/27/19 1714  TROPONINI <0.03   BNP (last 3 results) No results for input(s): PROBNP in the last 8760 hours. HbA1C: No results for input(s): HGBA1C in the last 72 hours. CBG: No results for input(s): GLUCAP in the last 168 hours. Lipid Profile: No results for input(s): CHOL, HDL, LDLCALC, TRIG, CHOLHDL, LDLDIRECT in the last 72 hours. Thyroid Function Tests: No results for input(s): TSH, T4TOTAL, FREET4, T3FREE, THYROIDAB in the last 72 hours. Anemia Panel: No results for input(s): VITAMINB12, FOLATE, FERRITIN, TIBC, IRON, RETICCTPCT in the last 72 hours. Sepsis Labs: No results for input(s): PROCALCITON, LATICACIDVEN in the last 168 hours.  Recent Results (from the past 240 hour(s))  SARS Coronavirus 2 [facility] order, Performed in [place] hospital lab)     Status: None   Collection Time: 01/27/19  9:06 PM  Result Value Ref Range Status   SARS Coronavirus 2 NEGATIVE NEGATIVE Final    Comment: (NOTE) If result is NEGATIVE SARS-CoV-2 target nucleic acids are NOT DETECTED. The SARS-CoV-2 RNA is generally detectable in upper and lower  respiratory specimens during the acute phase of infection. The lowest  concentration of SARS-CoV-2 viral copies this assay can detect is 250  copies / mL. A negative result does not preclude SARS-CoV-2 infection  and should not be used as the sole basis for treatment or other  patient management decisions.  A negative result may occur with  improper specimen collection / handling, submission of specimen other  than nasopharyngeal swab, presence of viral mutation(s) within the  areas targeted by this assay, and inadequate number of viral copies  (<250 copies / mL). A negative result must be combined with clinical  observations, patient history, and  epidemiological information. If result is POSITIVE SARS-CoV-2 target nucleic acids are DETECTED. The SARS-CoV-2 RNA is generally detectable in upper and lower  respiratory specimens dur ing the acute phase of infection.  Positive  results are indicative of active infection with SARS-CoV-2.  Clinical  correlation with patient history and other diagnostic information is  necessary to determine patient infection status.  Positive results do  not rule out bacterial infection or co-infection with other viruses. If result is PRESUMPTIVE POSTIVE SARS-CoV-2 nucleic acids MAY BE PRESENT.   A presumptive positive result was obtained on the submitted specimen  and confirmed on repeat testing.  While 2019 novel coronavirus  (SARS-CoV-2) nucleic acids may be present in the submitted sample  additional confirmatory testing may be necessary for epidemiological  and / or clinical management purposes  to differentiate between  SARS-CoV-2 and other Sarbecovirus currently known to infect humans.  If clinically indicated additional testing with an alternate test  methodology [phone]) is advised. The SARS-CoV-2 RNA is generally  detectable in upper and lower respiratory sp ecimens during the acute  phase of infection. The expected result is Negative. Fact Sheet for Patients:  StrictlyIdeas.no Fact Sheet for Healthcare Providers: BankingDealers.co.za This test is not yet approved or cleared by the [place] FDA and has been authorized for detection and/or diagnosis of SARS-CoV-2 by FDA under an Emergency Use Authorization (EUA).  This EUA will remain in effect (meaning this test can be used) for the duration of the COVID-19 declaration under Section 564(b)(1) of the Act, 21 U.S.C. section 360bbb-3(b)(1), unless the authorization is terminated or revoked sooner. Performed at [facility], [place] [address], [place],  [address]   Respiratory  Panel by PCR     Status: None   Collection Time: 01/27/19  9:06 PM  Result Value Ref Range Status   Adenovirus NOT DETECTED NOT DETECTED Final   Coronavirus 229E NOT DETECTED NOT DETECTED Final    Comment: (NOTE) The Coronavirus on the Respiratory Panel, DOES NOT test for the novel  Coronavirus (2019 nCoV)    Coronavirus HKU1 NOT DETECTED NOT DETECTED Final   Coronavirus NL63 NOT DETECTED NOT DETECTED Final   Coronavirus OC43 NOT DETECTED NOT DETECTED Final   Metapneumovirus NOT DETECTED NOT DETECTED Final   Rhinovirus / Enterovirus NOT DETECTED NOT DETECTED Final   Influenza A NOT DETECTED NOT DETECTED Final   Influenza B NOT DETECTED NOT DETECTED Final   Parainfluenza Virus 1 NOT DETECTED NOT DETECTED Final   Parainfluenza Virus 2 NOT DETECTED NOT DETECTED Final   Parainfluenza Virus 3 NOT DETECTED NOT DETECTED Final   Parainfluenza Virus  4 NOT DETECTED NOT DETECTED Final   Respiratory Syncytial Virus NOT DETECTED NOT DETECTED Final   Bordetella pertussis NOT DETECTED NOT DETECTED Final   Chlamydophila pneumoniae NOT DETECTED NOT DETECTED Final   Mycoplasma pneumoniae NOT DETECTED NOT DETECTED Final    Comment: Performed at [facility], [place] [address], [place], [place] [address]  Culture, blood (routine x 2) Call MD if unable to obtain prior to antibiotics being given     Status: None (Preliminary result)   Collection Time: 01/27/19 10:23 PM  Result Value Ref Range Status   Specimen Description BLOOD RIGHT HAND  Final   Special Requests   Final    BOTTLES DRAWN AEROBIC AND ANAEROBIC Blood Culture results may not be optimal due to an inadequate volume of blood received in culture bottles   Culture   Final    NO GROWTH < 12 HOURS Performed at [place] [address] [place], [place] [address]    Report Status PENDING  Incomplete  Culture, blood (routine x 2) Call MD if unable to obtain prior to antibiotics being given     Status: None (Preliminary result)    Collection Time: 01/27/19 10:37 PM  Result Value Ref Range Status   Specimen Description BLOOD LEFT ANTECUBITAL  Final   Special Requests   Final    BOTTLES DRAWN AEROBIC AND ANAEROBIC Blood Culture adequate volume   Culture   Final    NO GROWTH < 12 HOURS Performed at [facility], [place] [address] [place], [place] [address]    Report Status PENDING  Incomplete         Radiology Studies: Dg Chest 2 View  Result Date: 01/27/2019 CLINICAL DATA:  Chest pain EXAM: CHEST - 2 VIEW COMPARISON:  CT chest 01/06/2019 FINDINGS: Left-sided chest tube is present. Small left pleural effusion. Left basilar airspace disease and mild left suprahilar airspace disease. No pneumothorax. Stable cardiomediastinal silhouette. No aggressive osseous lesion. IMPRESSION: 1. Left-sided chest tube without a pneumothorax. Small left pleural effusion. Left basilar airspace disease and left suprahilar airspace disease most concerning for atelectasis versus pneumonia. Electronically Signed   By: Kathreen Devoid   On: 01/27/2019 17:58   Ct Angio Chest Pe W Or Wo Contrast  Result Date: 01/28/2019 CLINICAL DATA:  58 y/o F; PE suspected, high pretest prob Look for recurrent PE, is on eliquis though: other DDx includes CAP, COVID, or ILD/Pneumonitis from Osimertinib. History of adenocarcinoma of the lung. EXAM: CT ANGIOGRAPHY CHEST WITH CONTRAST TECHNIQUE: Multidetector CT imaging of the chest was performed using the standard protocol during bolus administration of intravenous contrast. Multiplanar CT image reconstructions and MIPs were obtained to evaluate the vascular anatomy. CONTRAST:  159mL OMNIPAQUE IOHEXOL 350 MG/ML SOLN COMPARISON:  01/06/2019 CT chest. 12/12/2018 PET-CT. FINDINGS: Cardiovascular: Satisfactory opacification of the pulmonary arteries. No acute pulmonary embolus identified. Within the right lower lobe segmental pulmonary artery there is a linear opacity compatible with a small residual chronic embolus.  Normal heart size. No pericardial effusion. Mild coronary artery calcific atherosclerosis. Normal caliber aorta and main pulmonary artery. Mediastinum/Nodes: No enlarged mediastinal, hilar, or axillary lymph nodes. Borderline left supraclavicular lymph node measuring 11 mm short axis (series 7, image 32). Thyroid gland, trachea, and esophagus demonstrate no significant findings. Lungs/Pleura: Left suprahilar mass within the medial left upper lobe again noted. The margins of the mass are poorly delineated from associated postobstructive consolidation and atelectasis in the left upper lobe. The mass appears decreased in  size best appreciated on the coronal reconstruction as demonstrated by decreased soft tissue within the adjacent mediastinum and local architectural distortion. There are decreased ground-glass opacities in the aerated portion of left upper lobe and the left lower lobe when compared with the prior study likely representing interval improvement of pneumonitis. Additionally, the left-sided pleural effusion is much decreased in size. There is a residual posterior loculation pleural fluid lateral to the PleurX catheter. PleurX catheter tip is stable in position level of the left fourth posterior rib. Upper Abdomen: No acute abnormality. Musculoskeletal: Sclerotic osseous metastasis at base of left glenoid, multiple thoracic vertebral bodies, and ribs better characterized on prior PET-CT. Thoracic metastasis are best appreciated at T1, T3, T8, T9, T10. No acute fracture identified. Review of the MIP images confirms the above findings. IMPRESSION: 1. No acute pulmonary embolus identified. Small residual chronic pulmonary embolus within a right lower lobe segmental pulmonary artery. 2. Left suprahilar mass appears decreased in size from prior CTA of the chest. Near complete resolution of postobstructive pneumonitis and improved aeration of left lung. 3. Decreased size of left-sided pleural effusion. Residual  loculated pleural fluid lateral to the PleurX catheter. 4. Stable borderline left supraclavicular lymph node, tracer avid on prior PET-CT. Stable osseous metastatic disease. Electronically Signed   By: Kristine Garbe M.D.   On: 01/28/2019 00:41        Scheduled Meds:  apixaban  5 mg Oral BID   azithromycin  500 mg Oral Q24H   budesonide (PULMICORT) nebulizer solution  0.25 mg Nebulization BID   levothyroxine  112 mcg Oral QAC breakfast   loratadine  10 mg Oral Daily   mouth rinse  15 mL Mouth Rinse BID   pantoprazole  40 mg Oral Q0600   Continuous Infusions:  cefTRIAXone (ROCEPHIN)  IV       LOS: 1 day    Time spent: 35 minutes.    Irine Seal, MD [facility]  If 7PM-7AM, please contact night-coverage [email or web address] Password [facility] 01/28/2019, 12:50 PM

## 2019-01-28 NOTE — Progress Notes (Addendum)
Patient transferred from ED to [phone]. Patient A&Ox4. Telemetry box 19 applied and second verified by Rosine Door, NT. Skin assessment completed by this RN and Meyer Cory. Patient instructed how to use the callbell before getting out of bed. Callbell within reach. Admission handbook given to patient. Will continue to monitor and treat per MD orders. Patient brought in home meds. They were counted and taken to pharmacy to be stored until discharge.

## 2019-01-29 LAB — CBC WITH DIFFERENTIAL/PLATELET
Abs Immature Granulocytes: 0.01 10*3/uL (ref 0.00–0.07)
Basophils Absolute: 0 10*3/uL (ref 0.0–0.1)
Basophils Relative: 0 %
Eosinophils Absolute: 0 10*3/uL (ref 0.0–0.5)
Eosinophils Relative: 0 %
HCT: 31.9 % — ABNORMAL LOW (ref 36.0–46.0)
Hemoglobin: 10.2 g/dL — ABNORMAL LOW (ref 12.0–15.0)
Immature Granulocytes: 0 %
Lymphocytes Relative: 3 %
Lymphs Abs: 0.1 10*3/uL — ABNORMAL LOW (ref 0.7–4.0)
MCH: 30 pg (ref 26.0–34.0)
MCHC: 32 g/dL (ref 30.0–36.0)
MCV: 93.8 fL (ref 80.0–100.0)
Monocytes Absolute: 0.1 10*3/uL (ref 0.1–1.0)
Monocytes Relative: 3 %
Neutro Abs: 3.5 10*3/uL (ref 1.7–7.7)
Neutrophils Relative %: 94 %
Platelets: 147 10*3/uL — ABNORMAL LOW (ref 150–400)
RBC: 3.4 MIL/uL — ABNORMAL LOW (ref 3.87–5.11)
RDW: 14.6 % (ref 11.5–15.5)
WBC: 3.8 10*3/uL — ABNORMAL LOW (ref 4.0–10.5)
nRBC: 0 % (ref 0.0–0.2)

## 2019-01-29 LAB — BASIC METABOLIC PANEL
Anion gap: 10 (ref 5–15)
BUN: 7 mg/dL (ref 6–20)
CO2: 22 mmol/L (ref 22–32)
Calcium: 8.8 mg/dL — ABNORMAL LOW (ref 8.9–10.3)
Chloride: 106 mmol/L (ref 98–111)
Creatinine, Ser: 0.64 mg/dL (ref 0.44–1.00)
GFR calc Af Amer: >60 mL/min (ref >60)
GFR calc non Af Amer: >60 mL/min (ref >60)
Glucose, Bld: 178 mg/dL — ABNORMAL HIGH (ref 70–99)
Potassium: 4.3 mmol/L (ref 3.5–5.1)
Sodium: 138 mmol/L (ref 135–145)

## 2019-01-29 MED ORDER — CEFDINIR 300 MG PO CAPS
300.0000 mg | ORAL_CAPSULE | Freq: Two times a day (BID) | ORAL | Status: DC
  Administered 2019-01-29 – 2019-01-30 (×2): 300 mg via ORAL
  Filled 2019-01-29 (×3): qty 1

## 2019-01-29 MED ORDER — METHYLPREDNISOLONE SODIUM SUCC 40 MG IJ SOLR
40.0000 mg | Freq: Two times a day (BID) | INTRAMUSCULAR | Status: DC
  Administered 2019-01-29 – 2019-01-30 (×2): 40 mg via INTRAVENOUS
  Filled 2019-01-29 (×2): qty 1

## 2019-01-29 MED ORDER — DIPHENHYDRAMINE HCL 50 MG/ML IJ SOLN
25.0000 mg | Freq: Two times a day (BID) | INTRAMUSCULAR | Status: DC
  Administered 2019-01-29: 21:00:00 25 mg via INTRAVENOUS
  Filled 2019-01-29 (×2): qty 1

## 2019-01-29 MED ORDER — SODIUM CHLORIDE 0.9 % IV SOLN
INTRAVENOUS | Status: DC
  Administered 2019-01-29 (×2): via INTRAVENOUS

## 2019-01-29 NOTE — Progress Notes (Addendum)
PROGRESS NOTE    Tina Cruz  ZDG:[id number] DOB: 1961/01/16 DOA: 01/27/2019 PCP: Tina Rossetti, MD    Brief Narrative:  HPI per Dr. Dayton Bailiff Tina Cruz is a 58 y.o. female with medical history significant of stage 4 NSCLC currently on Tagrisso, PE last month, started on eliquis, recurrent L pleural effusion s/p plurex catheter.  Patient presented to the ED after waking up this AM with new onset SOB.  Worse with activity or ambulation.  Improved when O2 of 2L started at home.  She does not normally wear O2 at baseline.  She reports dry cough for past 1 week.  No fevers no chills.  CP L sided, worse with deep inspiration.  Drained plurex catheter this AM but only small amount of output.   ED Course: Trop neg, Satting in 80s on RA, but 100% on 2L.  CXR shows LLL opacity.  WBC 3.4k, no fever, no tachycardia, EKG grossly unchanged from prior.  Patient given rocephin / azithro empirically for possible CAP.    Assessment & Plan:   Principal Problem:   Acute respiratory failure with hypoxia (HCC) Active Problems:   Adenocarcinoma of left lung, stage 4 (HCC)   Recurrent left pleural effusion   Pulmonary embolism (HCC)   CAP (community acquired pneumonia)  1 acute respiratory failure with hypoxia secondary to probable left lower lobe infiltrate/increased O2 requirements Patient presented with increased O2 requirements, acute new onset shortness of breath worse with activity on ambulation which improved on 2 L O2.  Patient not on oxygen at baseline.  COVID-19 rapid test negative.  CT angiogram chest negative for PE.  Patient's Tagrisso on hold pending infection.  Shortness of breath improving per patient.  Patient initially placed on scheduled duo nebs which have been discontinued due to itching and probable hives.  Sputum Gram stain and culture pending.  Urine strep pneumococcus antigen negative.  Urine Legionella antigen pending.  Has been discontinued.  Continue  Xopenex MDI as needed.  DC IV Rocephin and transition to oral Vantin. Continue oral azithromycin.  Check O2 on room air and on ambulation.  Continue supportive care.  Follow.   2.  History of PE Patient recently diagnosed with PE 3/27.  CT angiogram chest with no acute PE identified, small residual chronic pulmonary embolus within a right lower lobe segmental pulmonary artery.  Continue Eliquis for anticoagulation.  Outpatient follow-up.   3.  Non-small cell lung cancer CT angiogram chest with no acute PE noted.  Left suprahilar mass appears decreased in size from prior CT of the chest.  Near complete resolution of postobstructive pneumonitis and improved aeration of the left lung.  Decrease size of left sided pleural effusion.  Residual loculated pleural fluid lateral to Pleurx catheter.  Continue to hold Tagrisso until antibiotics have been completed.  Outpatient follow-up with oncology.  Curbsided oncology on call.  4.  Left Pleurx catheter CT angiogram chest with decreased size of left-sided pleural effusion.  Residual loculated pleural fluid lateral to Pleurx catheter.  Continue Pleurx catheter drainage.  5.  Rash/hives/itching Per RN patient with diffuse splotchy rash with associated itching which per patient started after breathing treatment.  Likely allergic reaction to duo nebs as it is noted that patient does have an allergy to albuterol which causes hives and itching from 2014.  Patient received antibiotics last dose around 8 PM on 01/27/2019 and as such doubt this is the etiology of her rash and itching.  Rash and itching improved on Benadryl,  Pepcid and Solu-Medrol.  Decrease Solu-Medrol to 40 mg IV every 12 hours and taper quickly.  Change Pepcid to oral Pepcid.  Decrease IV Benadryl to 25 mg IV every 12 hours.  Follow.  6.  Hypothyroidism Continue Synthroid.   DVT prophylaxis: Eliquis Code Status: Full Family Communication: Updated patient.  No family at bedside. Disposition Plan:  Likely home when clinically improved with resolution of hypoxia hopefully in the next 24 to 48 hours..   Consultants:   Curb sided oncology.  Procedures:   CT angiogram chest 01/27/2019  Chest x-ray 01/27/2019  Antimicrobials:   IV Rocephin 01/27/2019>>>> 01/29/2019  IV azithromycin 01/27/2019>>>>> oral azithromycin 01/28/2019  Oral Vantin 01/29/2019   Subjective: Patient sitting up in bed.  Patient denies any dizziness.  Denies any chest pain.  Feels shortness of breath is improved.  States itching and rash noted yesterday afternoon have improved after starting Benadryl, Pepcid and Solu-Medrol  Objective: Vitals:   01/28/19 1415 01/28/19 2249 01/29/19 0423 01/29/19 0902  BP: 108/70 91/63  104/68  Pulse: 99 85    Resp: 20 16    Temp: 98.6 F (37 C) 99 F (37.2 C)    TempSrc: Oral Oral    SpO2: 98% 98%    Weight:   80.4 kg   Height:        Intake/Output Summary (Last 24 hours) at 01/29/2019 1231 Last data filed at 01/29/2019 0900 Gross per 24 hour  Intake 390 ml  Output 2 ml  Net 388 ml   Filed Weights   01/27/19 2300 01/29/19 0423  Weight: 74.6 kg 80.4 kg    Examination:  General exam: NAD Respiratory system: Lungs clear to auscultation bilaterally.  No wheezes, no crackles, no rhonchi. Cardiovascular system: Regular rate rhythm no murmurs rubs or gallops.  No JVD.  No lower extremity edema.  Gastrointestinal system: Abdomen is soft, nontender, nondistended, positive bowel sounds.  No rebound.  No guarding. Central nervous system: Alert and oriented. No focal neurological deficits. Extremities: Symmetric 5 x 5 power. Skin: Rash improved.  Psychiatry: Judgement and insight appear normal. Mood & affect appropriate.     Data Reviewed: I have personally reviewed following labs and imaging studies  CBC: Recent Labs  Lab 01/27/19 1714 01/28/19 0340 01/29/19 0406  WBC 3.7* 3.9* 3.8*  NEUTROABS  --   --  3.5  HGB 10.7* 10.0* 10.2*  HCT 32.1* 30.0* 31.9*    MCV 93.6 92.6 93.8  PLT 153 138* 597*   Basic Metabolic Panel: Recent Labs  Lab 01/27/19 1714 01/28/19 0340 01/29/19 0406  NA 142 142 138  K 3.2* 3.8 4.3  CL 107 108 106  CO2 29 23 22   GLUCOSE 87 102* 178*  BUN 5* <5* 7  CREATININE 0.65 0.62 0.64  CALCIUM 8.9 8.8* 8.8*   GFR: Estimated Creatinine Clearance: 84.6 mL/min (by C-G formula based on SCr of 0.64 mg/dL). Liver Function Tests: Recent Labs  Lab 01/28/19 0340  AST 16  ALT 15  ALKPHOS 153*  BILITOT 0.8  PROT 5.1*  ALBUMIN 2.8*   No results for input(s): LIPASE, AMYLASE in the last 168 hours. No results for input(s): AMMONIA in the last 168 hours. Coagulation Profile: No results for input(s): INR, PROTIME in the last 168 hours. Cardiac Enzymes: Recent Labs  Lab 01/27/19 1714  TROPONINI <0.03   BNP (last 3 results) No results for input(s): PROBNP in the last 8760 hours. HbA1C: No results for input(s): HGBA1C in the last 72 hours. CBG: No  results for input(s): GLUCAP in the last 168 hours. Lipid Profile: No results for input(s): CHOL, HDL, LDLCALC, TRIG, CHOLHDL, LDLDIRECT in the last 72 hours. Thyroid Function Tests: No results for input(s): TSH, T4TOTAL, FREET4, T3FREE, THYROIDAB in the last 72 hours. Anemia Panel: No results for input(s): VITAMINB12, FOLATE, FERRITIN, TIBC, IRON, RETICCTPCT in the last 72 hours. Sepsis Labs: No results for input(s): PROCALCITON, LATICACIDVEN in the last 168 hours.  Recent Results (from the past 240 hour(s))  SARS Coronavirus 2 [facility] order, Performed in [facility] hospital lab)     Status: None   Collection Time: 01/27/19  9:06 PM  Result Value Ref Range Status   SARS Coronavirus 2 NEGATIVE NEGATIVE Final    Comment: (NOTE) If result is NEGATIVE SARS-CoV-2 target nucleic acids are NOT DETECTED. The SARS-CoV-2 RNA is generally detectable in upper and lower  respiratory specimens during the acute phase of infection. The lowest  concentration of SARS-CoV-2  viral copies this assay can detect is 250  copies / mL. A negative result does not preclude SARS-CoV-2 infection  and should not be used as the sole basis for treatment or other  patient management decisions.  A negative result may occur with  improper specimen collection / handling, submission of specimen other  than nasopharyngeal swab, presence of viral mutation(s) within the  areas targeted by this assay, and inadequate number of viral copies  (<250 copies / mL). A negative result must be combined with clinical  observations, patient history, and epidemiological information. If result is POSITIVE SARS-CoV-2 target nucleic acids are DETECTED. The SARS-CoV-2 RNA is generally detectable in upper and lower  respiratory specimens dur ing the acute phase of infection.  Positive  results are indicative of active infection with SARS-CoV-2.  Clinical  correlation with patient history and other diagnostic information is  necessary to determine patient infection status.  Positive results do  not rule out bacterial infection or co-infection with other viruses. If result is PRESUMPTIVE POSTIVE SARS-CoV-2 nucleic acids MAY BE PRESENT.   A presumptive positive result was obtained on the submitted specimen  and confirmed on repeat testing.  While 2019 novel coronavirus  (SARS-CoV-2) nucleic acids may be present in the submitted sample  additional confirmatory testing may be necessary for epidemiological  and / or clinical management purposes  to differentiate between  SARS-CoV-2 and other Sarbecovirus currently known to infect humans.  If clinically indicated additional testing with an alternate test  methodology [phone]) is advised. The SARS-CoV-2 RNA is generally  detectable in upper and lower respiratory sp ecimens during the acute  phase of infection. The expected result is Negative. Fact Sheet for Patients:  StrictlyIdeas.no Fact Sheet for Healthcare  Providers: BankingDealers.co.za This test is not yet approved or cleared by the [place] FDA and has been authorized for detection and/or diagnosis of SARS-CoV-2 by FDA under an Emergency Use Authorization (EUA).  This EUA will remain in effect (meaning this test can be used) for the duration of the COVID-19 declaration under Section 564(b)(1) of the Act, 21 U.S.C. section 360bbb-3(b)(1), unless the authorization is terminated or revoked sooner. Performed at [facility], [place] [address] [place], [place] [address]   Respiratory Panel by PCR     Status: None   Collection Time: 01/27/19  9:06 PM  Result Value Ref Range Status   Adenovirus NOT DETECTED NOT DETECTED Final   Coronavirus 229E NOT DETECTED NOT DETECTED Final    Comment: (NOTE) The Coronavirus on the Respiratory Panel,  DOES NOT test for the novel  Coronavirus (2019 nCoV)    Coronavirus HKU1 NOT DETECTED NOT DETECTED Final   Coronavirus NL63 NOT DETECTED NOT DETECTED Final   Coronavirus OC43 NOT DETECTED NOT DETECTED Final   Metapneumovirus NOT DETECTED NOT DETECTED Final   Rhinovirus / Enterovirus NOT DETECTED NOT DETECTED Final   Influenza A NOT DETECTED NOT DETECTED Final   Influenza B NOT DETECTED NOT DETECTED Final   Parainfluenza Virus 1 NOT DETECTED NOT DETECTED Final   Parainfluenza Virus 2 NOT DETECTED NOT DETECTED Final   Parainfluenza Virus 3 NOT DETECTED NOT DETECTED Final   Parainfluenza Virus 4 NOT DETECTED NOT DETECTED Final   Respiratory Syncytial Virus NOT DETECTED NOT DETECTED Final   Bordetella pertussis NOT DETECTED NOT DETECTED Final   Chlamydophila pneumoniae NOT DETECTED NOT DETECTED Final   Mycoplasma pneumoniae NOT DETECTED NOT DETECTED Final    Comment: Performed at [facility], [place] [address] [place], [place] [address]  Culture, blood (routine x 2) Call MD if unable to obtain prior to antibiotics being given     Status: None (Preliminary result)    Collection Time: 01/27/19 10:23 PM  Result Value Ref Range Status   Specimen Description BLOOD RIGHT HAND  Final   Special Requests   Final    BOTTLES DRAWN AEROBIC AND ANAEROBIC Blood Culture results may not be optimal due to an inadequate volume of blood received in culture bottles   Culture   Final    NO GROWTH 2 DAYS Performed at [facility] [address] [place], [place] [address]    Report Status PENDING  Incomplete  Culture, blood (routine x 2) Call MD if unable to obtain prior to antibiotics being given     Status: None (Preliminary result)   Collection Time: 01/27/19 10:37 PM  Result Value Ref Range Status   Specimen Description BLOOD LEFT ANTECUBITAL  Final   Special Requests   Final    BOTTLES DRAWN AEROBIC AND ANAEROBIC Blood Culture adequate volume   Culture   Final    NO GROWTH 2 DAYS Performed at [facility], [place] [address], [place], [place] [address]    Report Status PENDING  Incomplete         Radiology Studies: Dg Chest 2 View  Result Date: 01/27/2019 CLINICAL DATA:  Chest pain EXAM: CHEST - 2 VIEW COMPARISON:  CT chest 01/06/2019 FINDINGS: Left-sided chest tube is present. Small left pleural effusion. Left basilar airspace disease and mild left suprahilar airspace disease. No pneumothorax. Stable cardiomediastinal silhouette. No aggressive osseous lesion. IMPRESSION: 1. Left-sided chest tube without a pneumothorax. Small left pleural effusion. Left basilar airspace disease and left suprahilar airspace disease most concerning for atelectasis versus pneumonia. Electronically Signed   By: Kathreen Devoid   On: 01/27/2019 17:58   Ct Angio Chest Pe W Or Wo Contrast  Result Date: 01/28/2019 CLINICAL DATA:  58 y/o F; PE suspected, high pretest prob Look for recurrent PE, is on eliquis though: other DDx includes CAP, COVID, or ILD/Pneumonitis from Osimertinib. History of adenocarcinoma of the lung. EXAM: CT ANGIOGRAPHY CHEST WITH CONTRAST TECHNIQUE:  Multidetector CT imaging of the chest was performed using the standard protocol during bolus administration of intravenous contrast. Multiplanar CT image reconstructions and MIPs were obtained to evaluate the vascular anatomy. CONTRAST:  143mL OMNIPAQUE IOHEXOL 350 MG/ML SOLN COMPARISON:  01/06/2019 CT chest. 12/12/2018 PET-CT. FINDINGS: Cardiovascular: Satisfactory opacification of the pulmonary arteries. No acute pulmonary embolus identified.  Within the right lower lobe segmental pulmonary artery there is a linear opacity compatible with a small residual chronic embolus. Normal heart size. No pericardial effusion. Mild coronary artery calcific atherosclerosis. Normal caliber aorta and main pulmonary artery. Mediastinum/Nodes: No enlarged mediastinal, hilar, or axillary lymph nodes. Borderline left supraclavicular lymph node measuring 11 mm short axis (series 7, image 32). Thyroid gland, trachea, and esophagus demonstrate no significant findings. Lungs/Pleura: Left suprahilar mass within the medial left upper lobe again noted. The margins of the mass are poorly delineated from associated postobstructive consolidation and atelectasis in the left upper lobe. The mass appears decreased in size best appreciated on the coronal reconstruction as demonstrated by decreased soft tissue within the adjacent mediastinum and local architectural distortion. There are decreased ground-glass opacities in the aerated portion of left upper lobe and the left lower lobe when compared with the prior study likely representing interval improvement of pneumonitis. Additionally, the left-sided pleural effusion is much decreased in size. There is a residual posterior loculation pleural fluid lateral to the PleurX catheter. PleurX catheter tip is stable in position level of the left fourth posterior rib. Upper Abdomen: No acute abnormality. Musculoskeletal: Sclerotic osseous metastasis at base of left glenoid, multiple thoracic vertebral  bodies, and ribs better characterized on prior PET-CT. Thoracic metastasis are best appreciated at T1, T3, T8, T9, T10. No acute fracture identified. Review of the MIP images confirms the above findings. IMPRESSION: 1. No acute pulmonary embolus identified. Small residual chronic pulmonary embolus within a right lower lobe segmental pulmonary artery. 2. Left suprahilar mass appears decreased in size from prior CTA of the chest. Near complete resolution of postobstructive pneumonitis and improved aeration of left lung. 3. Decreased size of left-sided pleural effusion. Residual loculated pleural fluid lateral to the PleurX catheter. 4. Stable borderline left supraclavicular lymph node, tracer avid on prior PET-CT. Stable osseous metastatic disease. Electronically Signed   By: Kristine Garbe M.D.   On: 01/28/2019 00:41        Scheduled Meds:  apixaban  5 mg Oral BID   azithromycin  500 mg Oral Q24H   diphenhydrAMINE  25 mg Intravenous Q8H   levothyroxine  112 mcg Oral QAC breakfast   loratadine  10 mg Oral Daily   mouth rinse  15 mL Mouth Rinse BID   methylPREDNISolone (SOLU-MEDROL) injection  40 mg Intravenous Q8H   pantoprazole  40 mg Oral Q0600   Continuous Infusions:  cefTRIAXone (ROCEPHIN)  IV 1 g (01/28/19 1721)   famotidine (PEPCID) IV 20 mg (01/29/19 0553)     LOS: 2 days    Time spent: 35 minutes.    Irine Seal, MD [facility]  If 7PM-7AM, please contact night-coverage [email or web address] Password [facility] 01/29/2019, 12:31 PM

## 2019-01-29 NOTE — Progress Notes (Signed)
SATURATION QUALIFICATIONS: (This note is used to comply with regulatory documentation for home oxygen)  Patient Saturations on Room Air at Rest = 95%  Patient Saturations on Room Air while Ambulating = 93%

## 2019-01-29 NOTE — Progress Notes (Signed)
   01/29/19 1200  Clinical Encounter Type  Visited With Patient  Visit Type Initial;Spiritual support  Referral From Nurse  Consult/Referral To Chaplain  Spiritual Encounters  Spiritual Needs Emotional;Prayer  Stress Factors  Patient Stress Factors Health changes;Family relationships   Responded to spiritual care consult. PT was alert and talking. Pt shared concern for her husband carrying a big load with two people in the family with health concerns. PT mentioned the Doctor stated she should be discharged tomorrow and that she is looking forward to going home. I offered spiritual care with empathic listening, words of comfort, ministry of presence, and prayer. PT was very thankful for the [place] visit.  Chaplain Fidel Levy  [phone]

## 2019-01-30 ENCOUNTER — Other Ambulatory Visit: Payer: Self-pay | Admitting: Family Medicine

## 2019-01-30 ENCOUNTER — Other Ambulatory Visit: Payer: Self-pay | Admitting: *Deleted

## 2019-01-30 DIAGNOSIS — L299 Pruritus, unspecified: Secondary | ICD-10-CM

## 2019-01-30 DIAGNOSIS — L509 Urticaria, unspecified: Secondary | ICD-10-CM

## 2019-01-30 LAB — CBC WITH DIFFERENTIAL/PLATELET
Abs Immature Granulocytes: 0 10*3/uL (ref 0.00–0.07)
Basophils Absolute: 0 10*3/uL (ref 0.0–0.1)
Basophils Relative: 0 %
Eosinophils Absolute: 0 10*3/uL (ref 0.0–0.5)
Eosinophils Relative: 0 %
HCT: 29.8 % — ABNORMAL LOW (ref 36.0–46.0)
Hemoglobin: 10 g/dL — ABNORMAL LOW (ref 12.0–15.0)
Lymphocytes Relative: 3 %
Lymphs Abs: 0.2 10*3/uL — ABNORMAL LOW (ref 0.7–4.0)
MCH: 31.1 pg (ref 26.0–34.0)
MCHC: 33.6 g/dL (ref 30.0–36.0)
MCV: 92.5 fL (ref 80.0–100.0)
Monocytes Absolute: 0.5 10*3/uL (ref 0.1–1.0)
Monocytes Relative: 6 %
Neutro Abs: 7.2 10*3/uL (ref 1.7–7.7)
Neutrophils Relative %: 91 %
Platelets: 148 10*3/uL — ABNORMAL LOW (ref 150–400)
RBC: 3.22 MIL/uL — ABNORMAL LOW (ref 3.87–5.11)
RDW: 14.4 % (ref 11.5–15.5)
WBC: 7.9 10*3/uL (ref 4.0–10.5)
nRBC: 0 % (ref 0.0–0.2)
nRBC: 0 /100 WBC

## 2019-01-30 LAB — BASIC METABOLIC PANEL
Anion gap: 10 (ref 5–15)
BUN: 8 mg/dL (ref 6–20)
CO2: 25 mmol/L (ref 22–32)
Calcium: 8.8 mg/dL — ABNORMAL LOW (ref 8.9–10.3)
Chloride: 104 mmol/L (ref 98–111)
Creatinine, Ser: 0.68 mg/dL (ref 0.44–1.00)
GFR calc Af Amer: >60 mL/min (ref >60)
GFR calc non Af Amer: >60 mL/min (ref >60)
Glucose, Bld: 160 mg/dL — ABNORMAL HIGH (ref 70–99)
Potassium: 4.5 mmol/L (ref 3.5–5.1)
Sodium: 139 mmol/L (ref 135–145)

## 2019-01-30 MED ORDER — GUAIFENESIN ER 600 MG PO TB12: 1200.0000 mg | ORAL_TABLET | Freq: Two times a day (BID) | ORAL | 0 refills | Status: AC

## 2019-01-30 MED ORDER — LEVOTHYROXINE SODIUM 112 MCG PO TABS: 112.0000 ug | ORAL_TABLET | Freq: Every day | ORAL | 1 refills | Status: DC

## 2019-01-30 MED ORDER — AZITHROMYCIN 500 MG PO TABS: 500.0000 mg | ORAL_TABLET | ORAL | 0 refills | Status: AC

## 2019-01-30 MED ORDER — CEFDINIR 300 MG PO CAPS: 300.0000 mg | ORAL_CAPSULE | Freq: Two times a day (BID) | ORAL | 0 refills | Status: AC

## 2019-01-30 MED ORDER — FAMOTIDINE 20 MG PO TABS: 20.0000 mg | ORAL_TABLET | Freq: Every day | ORAL | 0 refills | Status: DC

## 2019-01-30 MED ORDER — GUAIFENESIN ER 600 MG PO TB12
1200.0000 mg | ORAL_TABLET | Freq: Two times a day (BID) | ORAL | Status: DC
  Administered 2019-01-30: 1200 mg via ORAL
  Filled 2019-01-30: qty 2

## 2019-01-30 MED ORDER — OSIMERTINIB MESYLATE 80 MG PO TABS: 80.0000 mg | ORAL_TABLET | Freq: Every day | ORAL | 0 refills | Status: DC

## 2019-01-30 MED ORDER — METHYLPREDNISOLONE SODIUM SUCC 40 MG IJ SOLR: 40.0000 mg | Freq: Every day | INTRAMUSCULAR | Status: DC

## 2019-01-30 MED ORDER — APIXABAN 5 MG PO TABS: 5.0000 mg | ORAL_TABLET | Freq: Two times a day (BID) | ORAL | 1 refills | Status: DC

## 2019-01-30 MED ORDER — DIPHENHYDRAMINE HCL 25 MG PO CAPS: 25.0000 mg | ORAL_CAPSULE | Freq: Every day | ORAL | 0 refills | Status: DC

## 2019-01-30 NOTE — Progress Notes (Signed)
Francesco Runner to be D/C'd home per MD order. Discussed with the patient and all questions fully answered.   VVS, Skin clean, dry and intact without evidence of skin break down, no evidence of skin tears noted.  IV catheter discontinued intact. Site without signs and symptoms of complications. Dressing and pressure applied.  An After Visit Summary was printed and given to the patient.  Patient escorted via [place], and D/C home via private auto.  Melonie Florida  01/30/2019 2:07 PM

## 2019-01-30 NOTE — Discharge Summary (Signed)
Physician Discharge Summary  Tina Cruz CWC:[id number] DOB: 1960-11-21 DOA: 01/27/2019  PCP: [name], MD  Admit date: 01/27/2019 Discharge date: 01/30/2019  Time spent: 50 minutes  Recommendations for Outpatient Follow-up:  1. Follow-up with [name], MD in 2 weeks. 2. Follow-up with Dr. Lorna Few, oncology as previously scheduled.   Discharge Diagnoses:  Principal Problem:   Acute respiratory failure with hypoxia ([place]) Active Problems:   CAP (community acquired pneumonia)   Adenocarcinoma of left lung, stage 4 (HCC)   Recurrent left pleural effusion   Pulmonary embolism ([place])   Discharge Condition: Stable and improved  Diet recommendation: Regular  Filed Weights   01/27/19 2300 01/29/19 0423 01/30/19 0530  Weight: 74.6 kg 80.4 kg 80.2 kg    History of present illness:  Per Dr. [place] Bailiff Tina Cruz is a 58 y.o. female with medical history significant of stage 4 NSCLC currently on Tagrisso, PE last month, started on eliquis, recurrent L pleural effusion s/p plurex catheter.  Patient presented to the ED after waking up the AM of admission, with new onset SOB.  Worse with activity or ambulation.  Improved when O2 of 2L started at home.  She does not normally wear O2 at baseline.  She reported dry cough for past 1 week.  No fevers no chills.  CP L sided, worse with deep inspiration.  Drained plurex catheter the morning of admission, but only small amount of output.   ED Course: Trop neg, Satting in 80s on RA, but 100% on 2L.  CXR shows LLL opacity.  WBC 3.4k, no fever, no tachycardia, EKG grossly unchanged from prior.  Patient given rocephin / azithro empirically for possible CAP.  Hospital Course:  1 acute respiratory failure with hypoxia secondary to probable left lower lobe infiltrate/increased O2 requirements Patient presented with increased O2 requirements, acute new onset shortness of breath worse with activity on ambulation  which improved on 2 L O2.  Patient not on oxygen at baseline.  COVID-19 rapid test negative.  CT angiogram chest negative for PE.  Patient's Newman Nip was held throughout the hospitalization and will not be resumed until antibiotic course has been completed. Patient initially placed on scheduled duo nebs which have been discontinued due to itching and probable hives as well as empiric IV Rocephin and azithromycin.Marland Kitchen  Sputum Gram stain and culture obtained with no growth to date.  Blood cultures were negative x3.  Urine strep pneumococcus antigen negative.  Urine Legionella antigen pending.  Patient was subsequently placed on Xopenex MDIs.  IV azithromycin was transitioned to oral azithromycin.  IV Rocephin subsequently transitioned to oral Omnicef which patient tolerated.  Patient was discharged home on 4 more days of oral Omnicef and 2 more days of oral azithromycin to complete a 7-day course of antibiotic treatment.  By day of discharge patient was satting 98% on room air.  Outpatient follow-up with PCP.   2.  History of PE Patient recently diagnosed with PE 3/27.  CT angiogram chest with no acute PE identified, small residual chronic pulmonary embolus within a right lower lobe segmental pulmonary artery.    Patient maintained on home regimen of Eliquis for anticoagulation.  Outpatient follow-up.   3.  Non-small cell lung cancer CT angiogram chest with no acute PE noted.  Left suprahilar mass appeared decreased in size from prior CT of the chest.  Near complete resolution of postobstructive pneumonitis and improved aeration of the left lung.  Decrease size of left sided pleural effusion.  Residual loculated pleural fluid lateral to Pleurx catheter.    Patient's Newman Nip was held during the hospitalization and will not be resumed until antibiotics have been completed.  Outpatient follow-up with oncology.  Curbsided oncology on call.  4.  Left Pleurx catheter CT angiogram chest with decreased size of  left-sided pleural effusion.  Residual loculated pleural fluid lateral to Pleurx catheter.  Continued on Pleurx catheter drainage.  5.  Rash/hives/itching Per RN patient with diffuse splotchy rash with associated itching which per patient started after breathing treatment.  Likely allergic reaction to duo nebs as it is noted that patient does have an allergy to albuterol which causes hives and itching from 2014.  Patient received antibiotics last dose around 8 PM on 01/27/2019 and as such doubt this is the etiology of her rash and itching.  Rash and itching improved on Benadryl, Pepcid and Solu-Medrol.    IV Solu-Medrol was tapered down as well as Benadryl and Pepcid.  Patient improved clinically and rash, hives, itching had resolved by day of discharge.  Patient be discharged home on Pepcid daily as well as a few days of Benadryl daily.  Outpatient follow-up with PCP.   6.  Hypothyroidism Patient maintained on home regimen of Synthroid.  Outpatient follow-up.   Procedures:  CT angiogram chest 01/27/2019  Chest x-ray 01/27/2019  Consultations:  Curb sided oncology.  Discharge Exam: Vitals:   01/29/19 2122 01/30/19 0530  BP: (!) 105/55 (!) 103/57  Pulse: 88 68  Resp: 16 16  Temp: 98.6 F (37 C) 98.6 F (37 C)  SpO2: 97% 98%    General: NAD Cardiovascular: RRR Respiratory: CTAB  Discharge Instructions   Discharge Instructions    Diet general   Complete by:  As directed    Increase activity slowly   Complete by:  As directed      Allergies as of 01/30/2019      Reactions   Albuterol Hives, Itching      Medication List    TAKE these medications   acetaminophen 325 MG tablet Commonly known as:  TYLENOL Take 325 mg by mouth every 6 (six) hours as needed for mild pain or headache.   apixaban 5 MG Tabs tablet Commonly known as:  ELIQUIS Take 5 mg by mouth 2 (two) times daily.   azelastine 0.1 % nasal spray Commonly known as:  ASTELIN Place 1 spray into both  nostrils 2 (two) times daily as needed for rhinitis. Use in each nostril as directed What changed:  additional instructions   azithromycin 500 MG tablet Commonly known as:  ZITHROMAX Take 1 tablet (500 mg total) by mouth daily for 2 days.   cefdinir 300 MG capsule Commonly known as:  OMNICEF Take 1 capsule (300 mg total) by mouth every 12 (twelve) hours for 4 days.   Cetirizine HCl 10 MG Caps Commonly known as:  ZyrTEC Allergy Take 1 capsule (10 mg total) by mouth daily as needed (allergies). What changed:  when to take this   diphenhydrAMINE 25 mg capsule Commonly known as:  BENADRYL Take 1 capsule (25 mg total) by mouth daily for 2 days.   famotidine 20 MG tablet Commonly known as:  PEPCID Take 1 tablet (20 mg total) by mouth daily. What changed:    when to take this  reasons to take this   guaiFENesin 600 MG 12 hr tablet Commonly known as:  MUCINEX Take 2 tablets (1,200 mg total) by mouth 2 (two) times daily for 5 days.  levalbuterol 45 MCG/ACT inhaler Commonly known as:  XOPENEX HFA Inhale 1-2 puffs into the lungs every 4 (four) hours as needed for wheezing or shortness of breath.   levothyroxine 112 MCG tablet Commonly known as:  SYNTHROID Take 1 tablet (112 mcg total) by mouth daily before breakfast.   LORazepam 1 MG tablet Commonly known as:  ATIVAN Take 1 tablet about 30 min before wearing mask for brain treatment. Must have a driver after taking this. Take 1 tab PRN before MRIs in the future, if feeling anxious. What changed:    how much to take  how to take this  when to take this  additional instructions   magnesium citrate Soln Take 0.5 Bottles by mouth daily as needed for moderate constipation or severe constipation.   osimertinib mesylate 80 MG tablet Commonly known as:  Tagrisso Take 1 tablet (80 mg total) by mouth daily. Start taking on:  February 05, 2019 What changed:  These instructions start on February 05, 2019. If you are unsure what to do  until then, ask your doctor or other care provider.   polyethylene glycol 17 g packet Commonly known as:  MIRALAX / GLYCOLAX Take 17 g by mouth daily.   sucralfate 1 GM/10ML suspension Commonly known as:  CARAFATE Take 10 mLs (1 g total) by mouth 4 (four) times daily -  with meals and at bedtime.      Allergies  Allergen Reactions  . Albuterol Hives and Itching   Follow-up Information    [facility], Modena Nunnery, MD. Schedule an appointment as soon as possible for a visit in 2 week(s).   Specialty:  Family Medicine Contact information: [address] [place] [address] [phone]        [name], MD Follow up.   Specialty:  Oncology Why:  Follow-up as scheduled. Contact information: [place] [address] [phone]            The results of significant diagnostics from this hospitalization (including imaging, microbiology, ancillary and laboratory) are listed below for reference.    Significant Diagnostic Studies: Dg Chest 2 View  Result Date: 01/27/2019 CLINICAL DATA:  Chest pain EXAM: CHEST - 2 VIEW COMPARISON:  CT chest 01/06/2019 FINDINGS: Left-sided chest tube is present. Small left pleural effusion. Left basilar airspace disease and mild left suprahilar airspace disease. No pneumothorax. Stable cardiomediastinal silhouette. No aggressive osseous lesion. IMPRESSION: 1. Left-sided chest tube without a pneumothorax. Small left pleural effusion. Left basilar airspace disease and left suprahilar airspace disease most concerning for atelectasis versus pneumonia. Electronically Signed   By: Kathreen Devoid   On: 01/27/2019 17:58   Dg Chest 2 View  Result Date: 01/04/2019 CLINICAL DATA:  Malignant pleural effusion. EXAM: CHEST - 2 VIEW COMPARISON:  12/12/2018 FINDINGS: Increased densities throughout the left chest compatible with a large left pleural effusion. There appears to be mediastinal shift towards the right due to the large  effusion. Left pleural effusion has enlarged from the prior imaging and there is a small amount of residual aeration in the left upper lung. Right lung is clear. No acute bone abnormality. Left side of the heart is obscured by the large left pleural effusion. IMPRESSION: Large left pleural effusion. Pleural effusion has enlarged since the PET-CT on 12/12/2018. Right mediastinal shift due to the large left pleural effusion. Electronically Signed   By: Markus Daft M.D.   On: 01/04/2019 12:27   Ct Angio Chest Pe W Or Wo Contrast  Result Date: 01/28/2019 CLINICAL DATA:  58 y/o F; PE suspected, high pretest prob Look for recurrent PE, is on eliquis though: other DDx includes CAP, COVID, or ILD/Pneumonitis from Osimertinib. History of adenocarcinoma of the lung. EXAM: CT ANGIOGRAPHY CHEST WITH CONTRAST TECHNIQUE: Multidetector CT imaging of the chest was performed using the standard protocol during bolus administration of intravenous contrast. Multiplanar CT image reconstructions and MIPs were obtained to evaluate the vascular anatomy. CONTRAST:  157mL OMNIPAQUE IOHEXOL 350 MG/ML SOLN COMPARISON:  01/06/2019 CT chest. 12/12/2018 PET-CT. FINDINGS: Cardiovascular: Satisfactory opacification of the pulmonary arteries. No acute pulmonary embolus identified. Within the right lower lobe segmental pulmonary artery there is a linear opacity compatible with a small residual chronic embolus. Normal heart size. No pericardial effusion. Mild coronary artery calcific atherosclerosis. Normal caliber aorta and main pulmonary artery. Mediastinum/Nodes: No enlarged mediastinal, hilar, or axillary lymph nodes. Borderline left supraclavicular lymph node measuring 11 mm short axis (series 7, image 32). Thyroid gland, trachea, and esophagus demonstrate no significant findings. Lungs/Pleura: Left suprahilar mass within the medial left upper lobe again noted. The margins of the mass are poorly delineated from associated postobstructive  consolidation and atelectasis in the left upper lobe. The mass appears decreased in size best appreciated on the coronal reconstruction as demonstrated by decreased soft tissue within the adjacent mediastinum and local architectural distortion. There are decreased ground-glass opacities in the aerated portion of left upper lobe and the left lower lobe when compared with the prior study likely representing interval improvement of pneumonitis. Additionally, the left-sided pleural effusion is much decreased in size. There is a residual posterior loculation pleural fluid lateral to the PleurX catheter. PleurX catheter tip is stable in position level of the left fourth posterior rib. Upper Abdomen: No acute abnormality. Musculoskeletal: Sclerotic osseous metastasis at base of left glenoid, multiple thoracic vertebral bodies, and ribs better characterized on prior PET-CT. Thoracic metastasis are best appreciated at T1, T3, T8, T9, T10. No acute fracture identified. Review of the MIP images confirms the above findings. IMPRESSION: 1. No acute pulmonary embolus identified. Small residual chronic pulmonary embolus within a right lower lobe segmental pulmonary artery. 2. Left suprahilar mass appears decreased in size from prior CTA of the chest. Near complete resolution of postobstructive pneumonitis and improved aeration of left lung. 3. Decreased size of left-sided pleural effusion. Residual loculated pleural fluid lateral to the PleurX catheter. 4. Stable borderline left supraclavicular lymph node, tracer avid on prior PET-CT. Stable osseous metastatic disease. Electronically Signed   By: Kristine Garbe M.D.   On: 01/28/2019 00:41   Ct Angio Chest Pe W And/or Wo Contrast  Result Date: 01/06/2019 CLINICAL DATA:  Dyspnea with stage IV lung cancer.  Hypoxia. EXAM: CT ANGIOGRAPHY CHEST WITH CONTRAST TECHNIQUE: Multidetector CT imaging of the chest was performed using the standard protocol during bolus  administration of intravenous contrast. Multiplanar CT image reconstructions and MIPs were obtained to evaluate the vascular anatomy. CONTRAST:  18mL OMNIPAQUE IOHEXOL 350 MG/ML SOLN COMPARISON:  Head CT 12/12/2018 and chest CT 11/11/2018 FINDINGS: Cardiovascular: Conventional branch pattern of the great vessels. Nonaneurysmal thoracic aorta. Acute pulmonary emboli noted on the right starting from the bifurcation of the right main pulmonary artery extending into the right upper and lower lobe lobar and segmental branches. Right heart strain is identified with RV/LV ratio 1.2. Mediastinum/Nodes: Small subcentimeter mediastinal lymph nodes with previously described metabolically active left-sided supraclavicular lymph nodes, the largest approximately 9 mm. No thyromegaly or mass. Patent trachea and mainstem bronchi with  slight luminal narrowing of the distal left upper lobe bronchi from known mass and consolidation. Lungs/Pleura: Redemonstration of masslike opacity and postobstructive consolidation of the medial left upper lobe. The margins of the mass are difficult to identified due to the adjacent abutting pulmonary consolidations likely representing postobstructive change. The degree of postobstructive consolidation appears slightly increased since prior. Moderate to large left pleural effusion with adjacent left basilar compressive atelectasis with atelectasis along the left major fissure is identified. The volume of fluid in the left hemithorax has increased slightly from the November 11, 2018 exam though is decreased in appearance relative to the more recent prior PET-CT from 12/12/2018. The tip of a Pleurx catheter terminates at the posterior fourth rib level. Patchy airspace disease in the left upper lobe, lingula and left lower lobe can not exclude superimposed pneumonia. Upper Abdomen: No acute abnormality. Musculoskeletal: Redemonstration of scattered osseous metastasis, at the base of the left glenoid,  midthoracic spine and ribs better visualized on prior PET-CT. Review of the MIP images confirms the above findings. IMPRESSION: 1. Acute multilobar right-sided pulmonary emboli to the right upper and lower lobes with right heart strain, RV/LV ratio 1.2. 2. New patchy airspace opacities within the aerated left upper lobe, lingula and left lower lobe. Pneumonia is not excluded. 3. Redemonstration of left upper lobe mass with postobstructive consolidation and moderate to large left pleural effusion. PleurX catheter is noted within effusion. 4. Osseous metastatic disease to the dorsal spine, left scapula and ribs. These results were called by telephone at the time of interpretation on 01/06/2019 at 7:11 pm to Dr. Nanda Quinton , who verbally acknowledged these results. Electronically Signed   By: Ashley Royalty M.D.   On: 01/06/2019 19:14   Dg Chest Port 1 View  Result Date: 01/06/2019 CLINICAL DATA:  Status post left PleurX catheter placement for malignant pleural effusion secondary to adenocarcinoma of the lung. EXAM: PORTABLE CHEST 1 VIEW COMPARISON:  Film earlier today at 1228 hours FINDINGS: Left basilar PleurX catheter present. After fluid removal, there is reduction in amount of left pleural fluid volume with improved aeration of the left lung. Small amount of pleural fluid remains. No pneumothorax. Right lung remains clear. IMPRESSION: Decrease in left pleural fluid volume after placement of PleurX catheter and drainage of pleural fluid. No pneumothorax. Electronically Signed   By: Aletta Edouard M.D.   On: 01/06/2019 16:25   Dg Chest Portable 1 View  Result Date: 01/06/2019 CLINICAL DATA:  Shortness of breath, pleural effusion EXAM: PORTABLE CHEST 1 VIEW COMPARISON:  01/04/2019 FINDINGS: Large left pleural effusion unchanged with 01/04/2019. No right pleural effusion. No pneumothorax. Stable cardiomediastinal silhouette. No acute osseous abnormality. IMPRESSION: 1. Stable large left pleural effusion.  Electronically Signed   By: Kathreen Devoid   On: 01/06/2019 12:49   Ir Perc Pleural Drain Genia Harold Cath W/img Guide  Result Date: 01/06/2019 INDICATION: 58 year old with lung cancer and malignant left pleural effusion. Patient was scheduled for a tunneled pleural catheter by Thoracic Surgery today at [facility]. Patient was at [facility] for radiation therapy but became short of breath and ended up in the Emergency Department. Interventional Radiology was consulted for placement of the tunneled pleural catheter. EXAM: PLACEMENT OF TUNNELED LEFT PLEURAL CATHETER WITH ULTRASOUND AND FLUOROSCOPIC GUIDANCE MEDICATIONS: Ancef 2 g ANESTHESIA/SEDATION: Fentanyl 50 mcg IV; Versed 1 mg IV Moderate Sedation Time:  24 minutes The patient was continuously monitored during the procedure by the interventional radiology nurse under my direct supervision. FLUOROSCOPY TIME:  30 seconds, 15 mGy COMPLICATIONS: None immediate. PROCEDURE: Informed written consent was obtained from the patient after a thorough discussion of the procedural risks, benefits and alternatives. All questions were addressed. Maximal Sterile Barrier Technique was utilized including caps, mask, sterile gowns, sterile gloves, sterile drape, hand hygiene and skin antiseptic. A timeout was performed prior to the initiation of the procedure. Ultrasound was used to identify a large amount of left pleural fluid. The left mid axillary line and left anterior lower chest area was prepped and draped in sterile fashion. Skin was anesthetized and 2 small incisions were made. 15.5 [place] PleurX catheter was tunneled between the incisions and the cuff was placed underneath the skin. A Yueh catheter was directed into the pleural space along the more lateral incision with ultrasound guidance. Yellow fluid was aspirated. Wire was advanced into the pleural space and a peel-away sheath was placed. Catheter was directed through the peel-away sheath into the  pleural space. 2 L of yellow pleural fluid was removed. Pleural entrance skin site was closed using absorbable suture and Dermabond. Catheter was sutured to skin with Prolene suture. Fluoroscopic and ultrasound images were taken and saved for documentation. IMPRESSION: Successful placement of a tunneled left pleural catheter with ultrasound and fluoroscopic guidance. Electronically Signed   By: Markus Daft M.D.   On: 01/06/2019 16:07    Microbiology: Recent Results (from the past 240 hour(s))  SARS Coronavirus 2 [facility] order, Performed in [facility] hospital lab)     Status: None   Collection Time: 01/27/19  9:06 PM  Result Value Ref Range Status   SARS Coronavirus 2 NEGATIVE NEGATIVE Final    Comment: (NOTE) If result is NEGATIVE SARS-CoV-2 target nucleic acids are NOT DETECTED. The SARS-CoV-2 RNA is generally detectable in upper and lower  respiratory specimens during the acute phase of infection. The lowest  concentration of SARS-CoV-2 viral copies this assay can detect is 250  copies / mL. A negative result does not preclude SARS-CoV-2 infection  and should not be used as the sole basis for treatment or other  patient management decisions.  A negative result may occur with  improper specimen collection / handling, submission of specimen other  than nasopharyngeal swab, presence of viral mutation(s) within the  areas targeted by this assay, and inadequate number of viral copies  (<250 copies / mL). A negative result must be combined with clinical  observations, patient history, and epidemiological information. If result is POSITIVE SARS-CoV-2 target nucleic acids are DETECTED. The SARS-CoV-2 RNA is generally detectable in upper and lower  respiratory specimens dur ing the acute phase of infection.  Positive  results are indicative of active infection with SARS-CoV-2.  Clinical  correlation with patient history and other diagnostic information is  necessary to determine patient  infection status.  Positive results do  not rule out bacterial infection or co-infection with other viruses. If result is PRESUMPTIVE POSTIVE SARS-CoV-2 nucleic acids MAY BE PRESENT.   A presumptive positive result was obtained on the submitted specimen  and confirmed on repeat testing.  While 2019 novel coronavirus  (SARS-CoV-2) nucleic acids may be present in the submitted sample  additional confirmatory testing may be necessary for epidemiological  and / or clinical management purposes  to differentiate between  SARS-CoV-2 and other Sarbecovirus currently known to infect humans.  If clinically indicated additional testing with an alternate test  methodology [phone]) is advised. The SARS-CoV-2 RNA is generally  detectable in upper and lower respiratory sp ecimens during the  acute  phase of infection. The expected result is Negative. Fact Sheet for Patients:  StrictlyIdeas.no Fact Sheet for Healthcare Providers: BankingDealers.co.za This test is not yet approved or cleared by the [place] FDA and has been authorized for detection and/or diagnosis of SARS-CoV-2 by FDA under an Emergency Use Authorization (EUA).  This EUA will remain in effect (meaning this test can be used) for the duration of the COVID-19 declaration under Section 564(b)(1) of the Act, 21 U.S.C. section 360bbb-3(b)(1), unless the authorization is terminated or revoked sooner. Performed at [facility], [place] [address] [place], [place] [address]   Respiratory Panel by PCR     Status: None   Collection Time: 01/27/19  9:06 PM  Result Value Ref Range Status   Adenovirus NOT DETECTED NOT DETECTED Final   Coronavirus 229E NOT DETECTED NOT DETECTED Final    Comment: (NOTE) The Coronavirus on the Respiratory Panel, DOES NOT test for the novel  Coronavirus (2019 nCoV)    Coronavirus HKU1 NOT DETECTED NOT DETECTED Final   Coronavirus NL63 NOT DETECTED NOT  DETECTED Final   Coronavirus OC43 NOT DETECTED NOT DETECTED Final   Metapneumovirus NOT DETECTED NOT DETECTED Final   Rhinovirus / Enterovirus NOT DETECTED NOT DETECTED Final   Influenza A NOT DETECTED NOT DETECTED Final   Influenza B NOT DETECTED NOT DETECTED Final   Parainfluenza Virus 1 NOT DETECTED NOT DETECTED Final   Parainfluenza Virus 2 NOT DETECTED NOT DETECTED Final   Parainfluenza Virus 3 NOT DETECTED NOT DETECTED Final   Parainfluenza Virus 4 NOT DETECTED NOT DETECTED Final   Respiratory Syncytial Virus NOT DETECTED NOT DETECTED Final   Bordetella pertussis NOT DETECTED NOT DETECTED Final   Chlamydophila pneumoniae NOT DETECTED NOT DETECTED Final   Mycoplasma pneumoniae NOT DETECTED NOT DETECTED Final    Comment: Performed at [facility], [place]. [address] [place], [place] [address]  Culture, blood (routine x 2) Call MD if unable to obtain prior to antibiotics being given     Status: None (Preliminary result)   Collection Time: 01/27/19 10:23 PM  Result Value Ref Range Status   Specimen Description BLOOD RIGHT HAND  Final   Special Requests   Final    BOTTLES DRAWN AEROBIC AND ANAEROBIC Blood Culture results may not be optimal due to an inadequate volume of blood received in culture bottles   Culture   Final    NO GROWTH 3 DAYS Performed at [facility], [place] [address], [place], [place] [address]    Report Status PENDING  Incomplete  Culture, blood (routine x 2) Call MD if unable to obtain prior to antibiotics being given     Status: None (Preliminary result)   Collection Time: 01/27/19 10:37 PM  Result Value Ref Range Status   Specimen Description BLOOD LEFT ANTECUBITAL  Final   Special Requests   Final    BOTTLES DRAWN AEROBIC AND ANAEROBIC Blood Culture adequate volume   Culture   Final    NO GROWTH 3 DAYS Performed at [facility], [place] [address], [place], [place] [address]    Report Status PENDING  Incomplete     Labs: Basic Metabolic  Panel: Recent Labs  Lab 01/27/19 1714 01/28/19 0340 01/29/19 0406 01/30/19 0402  NA 142 142 138 139  K 3.2* 3.8 4.3 4.5  CL 107 108 106 104  CO2 29 23 22 25   GLUCOSE 87 102* 178* 160*  BUN 5* <5* 7 8  CREATININE  0.65 0.62 0.64 0.68  CALCIUM 8.9 8.8* 8.8* 8.8*   Liver Function Tests: Recent Labs  Lab 01/28/19 0340  AST 16  ALT 15  ALKPHOS 153*  BILITOT 0.8  PROT 5.1*  ALBUMIN 2.8*   No results for input(s): LIPASE, AMYLASE in the last 168 hours. No results for input(s): AMMONIA in the last 168 hours. CBC: Recent Labs  Lab 01/27/19 1714 01/28/19 0340 01/29/19 0406 01/30/19 0402  WBC 3.7* 3.9* 3.8* 7.9  NEUTROABS  --   --  3.5 7.2  HGB 10.7* 10.0* 10.2* 10.0*  HCT 32.1* 30.0* 31.9* 29.8*  MCV 93.6 92.6 93.8 92.5  PLT 153 138* 147* 148*   Cardiac Enzymes: Recent Labs  Lab 01/27/19 1714  TROPONINI <0.03   BNP: BNP (last 3 results) Recent Labs    01/27/19 2043  BNP 29.5    ProBNP (last 3 results) No results for input(s): PROBNP in the last 8760 hours.  CBG: No results for input(s): GLUCAP in the last 168 hours.     Signed:  Irine Seal MD.  [facility] 01/30/2019, 12:49 PM

## 2019-01-30 NOTE — Consult Note (Addendum)
WOC consult requested for Plurex drainage tube.  Our team is not trained to manage care to these devices; please refer to the bedside nurse for further questions. Please re-consult if further assistance is needed.  Thank-you,  Julien Girt MSN, [place], [place], [place], [place]

## 2019-01-31 ENCOUNTER — Encounter: Payer: Self-pay | Admitting: Internal Medicine

## 2019-01-31 ENCOUNTER — Inpatient Hospital Stay: Payer: Managed Care, Other (non HMO) | Attending: Internal Medicine

## 2019-01-31 ENCOUNTER — Inpatient Hospital Stay (HOSPITAL_BASED_OUTPATIENT_CLINIC_OR_DEPARTMENT_OTHER): Payer: Managed Care, Other (non HMO) | Admitting: Internal Medicine

## 2019-01-31 ENCOUNTER — Other Ambulatory Visit: Payer: Self-pay

## 2019-01-31 VITALS — BP 107/63 | HR 77 | Temp 98.2°F | Resp 18 | Ht 67.0 in | Wt 164.6 lb

## 2019-01-31 DIAGNOSIS — Z7901 Long term (current) use of anticoagulants: Secondary | ICD-10-CM | POA: Diagnosis not present

## 2019-01-31 DIAGNOSIS — K219 Gastro-esophageal reflux disease without esophagitis: Secondary | ICD-10-CM

## 2019-01-31 DIAGNOSIS — E039 Hypothyroidism, unspecified: Secondary | ICD-10-CM

## 2019-01-31 DIAGNOSIS — R5383 Other fatigue: Secondary | ICD-10-CM | POA: Diagnosis not present

## 2019-01-31 DIAGNOSIS — J910 Malignant pleural effusion: Secondary | ICD-10-CM

## 2019-01-31 DIAGNOSIS — R0602 Shortness of breath: Secondary | ICD-10-CM

## 2019-01-31 DIAGNOSIS — C7951 Secondary malignant neoplasm of bone: Secondary | ICD-10-CM | POA: Insufficient documentation

## 2019-01-31 DIAGNOSIS — C7971 Secondary malignant neoplasm of right adrenal gland: Secondary | ICD-10-CM

## 2019-01-31 DIAGNOSIS — C3412 Malignant neoplasm of upper lobe, left bronchus or lung: Secondary | ICD-10-CM | POA: Diagnosis present

## 2019-01-31 DIAGNOSIS — C782 Secondary malignant neoplasm of pleura: Secondary | ICD-10-CM | POA: Insufficient documentation

## 2019-01-31 DIAGNOSIS — F329 Major depressive disorder, single episode, unspecified: Secondary | ICD-10-CM

## 2019-01-31 DIAGNOSIS — Z5111 Encounter for antineoplastic chemotherapy: Secondary | ICD-10-CM

## 2019-01-31 DIAGNOSIS — I2699 Other pulmonary embolism without acute cor pulmonale: Secondary | ICD-10-CM | POA: Insufficient documentation

## 2019-01-31 DIAGNOSIS — C3492 Malignant neoplasm of unspecified part of left bronchus or lung: Secondary | ICD-10-CM

## 2019-01-31 DIAGNOSIS — C7931 Secondary malignant neoplasm of brain: Secondary | ICD-10-CM | POA: Insufficient documentation

## 2019-01-31 DIAGNOSIS — J189 Pneumonia, unspecified organism: Secondary | ICD-10-CM

## 2019-01-31 DIAGNOSIS — J181 Lobar pneumonia, unspecified organism: Secondary | ICD-10-CM

## 2019-01-31 DIAGNOSIS — Z79899 Other long term (current) drug therapy: Secondary | ICD-10-CM | POA: Insufficient documentation

## 2019-01-31 LAB — CBC WITH DIFFERENTIAL (CANCER CENTER ONLY)
Abs Immature Granulocytes: 0.12 10*3/uL — ABNORMAL HIGH (ref 0.00–0.07)
Basophils Absolute: 0 10*3/uL (ref 0.0–0.1)
Basophils Relative: 0 %
Eosinophils Absolute: 0.1 10*3/uL (ref 0.0–0.5)
Eosinophils Relative: 1 %
HCT: 34.3 % — ABNORMAL LOW (ref 36.0–46.0)
Hemoglobin: 11.1 g/dL — ABNORMAL LOW (ref 12.0–15.0)
Immature Granulocytes: 2 %
Lymphocytes Relative: 6 %
Lymphs Abs: 0.4 10*3/uL — ABNORMAL LOW (ref 0.7–4.0)
MCH: 30.9 pg (ref 26.0–34.0)
MCHC: 32.4 g/dL (ref 30.0–36.0)
MCV: 95.5 fL (ref 80.0–100.0)
Monocytes Absolute: 0.7 10*3/uL (ref 0.1–1.0)
Monocytes Relative: 10 %
Neutro Abs: 5.5 10*3/uL (ref 1.7–7.7)
Neutrophils Relative %: 81 %
Platelet Count: 154 10*3/uL (ref 150–400)
RBC: 3.59 MIL/uL — ABNORMAL LOW (ref 3.87–5.11)
RDW: 15 % (ref 11.5–15.5)
WBC Count: 6.9 10*3/uL (ref 4.0–10.5)
nRBC: 0 % (ref 0.0–0.2)

## 2019-01-31 LAB — CMP (CANCER CENTER ONLY)
ALT: 20 U/L (ref 0–44)
AST: 26 U/L (ref 15–41)
Albumin: 3.3 g/dL — ABNORMAL LOW (ref 3.5–5.0)
Alkaline Phosphatase: 161 U/L — ABNORMAL HIGH (ref 38–126)
Anion gap: 9 (ref 5–15)
BUN: 11 mg/dL (ref 6–20)
CO2: 28 mmol/L (ref 22–32)
Calcium: 8.6 mg/dL — ABNORMAL LOW (ref 8.9–10.3)
Chloride: 107 mmol/L (ref 98–111)
Creatinine: 0.73 mg/dL (ref 0.44–1.00)
GFR, Est AFR Am: >60 mL/min (ref >60)
GFR, Estimated: >60 mL/min (ref >60)
Glucose, Bld: 92 mg/dL (ref 70–99)
Potassium: 4 mmol/L (ref 3.5–5.1)
Sodium: 144 mmol/L (ref 135–145)
Total Bilirubin: 0.7 mg/dL (ref 0.3–1.2)
Total Protein: 6 g/dL — ABNORMAL LOW (ref 6.5–8.1)

## 2019-01-31 NOTE — Progress Notes (Signed)
[facility] [phone]   [phone]  OFFICE PROGRESS NOTE  [name], MD [address] [place] [address]  DIAGNOSIS: stage IV (T2b, N3, M1 C) non-small cell lung cancer, adenocarcinoma presented with left suprahilar mass with postobstructive collapse of the left upper lobe in addition to mediastinal and left supraclavicular lymphadenopathy as well as malignant left pleural effusion with pleural-based metastasis and metastatic disease to the bone, right adrenal gland and brain diagnosed in February 2020. Molecular studies: POSITIVE for the Exon 21 L861Q mutation  POSITIVE for the Exon 18 G719X mutation  NEGATIVE for the Exon 20 T790M mutation   PRIOR THERAPY: 1) palliative radiotherapy to the brain as well as the metastatic bone disease and the obstructive left suprahilar mass. 2) left Pleurx catheter placement under the care of Dr. Prescott Gum.  CURRENT THERAPY: Tagrisso 80 mg p.o. daily.  First dose started January 13, 2019 INTERVAL HISTORY: Tina Cruz 58 y.o. female returns to the clinic today for follow-up visit.  The patient is feeling fine today except for the baseline shortness of breath increased with exertion.  She was recently admitted to [facility] with questionable pneumonia.  She had repeat CT angiogram of the chest during her hospitalization that showed improvement of her disease but there was suspicious pneumonia.  The patient was started on antibiotics for the pneumonia and she continues this treatment on outpatient basis.  She denied having any chest pain or hemoptysis.  She denied having any recent weight loss or night sweats.  She has been tolerating her treatment with Tagrisso fairly well.  Tagrisso has been on hold during her hospitalization.  The patient is here today for evaluation and discussion of her treatment plan.  MEDICAL HISTORY: Past Medical History:  Diagnosis Date   Active smoker    quit  2016   Cancer [facility])    left lung stage IV non-cell carcinoma/adenocarcinoma with diagnosis made from left pleural effusion.     Cancer ([place])    Brain metastasis    Constipation    Depression    Dyspnea    GERD (gastroesophageal reflux disease)    H/O chronic bronchitis    Hypothyroidism    Seasonal allergies    SVD (spontaneous vaginal delivery)    x 2   Thyroid disease    hypothyroid   Wears partial dentures     ALLERGIES:  is allergic to albuterol.  MEDICATIONS:  Current Outpatient Medications  Medication Sig Dispense Refill   acetaminophen (TYLENOL) 325 MG tablet Take 325 mg by mouth every 6 (six) hours as needed for mild pain or headache.     apixaban (ELIQUIS) 5 MG TABS tablet Take 1 tablet (5 mg total) by mouth 2 (two) times daily. 60 tablet 1   azelastine (ASTELIN) 0.1 % nasal spray Place 1 spray into both nostrils 2 (two) times daily as needed for rhinitis. Use in each nostril as directed (Patient taking differently: Place 1 spray into both nostrils 2 (two) times daily as needed for rhinitis. ) 30 mL 12   azithromycin (ZITHROMAX) 500 MG tablet Take 1 tablet (500 mg total) by mouth daily for 2 days. 2 tablet 0   cefdinir (OMNICEF) 300 MG capsule Take 1 capsule (300 mg total) by mouth every 12 (twelve) hours for 4 days. 8 capsule 0   Cetirizine HCl (ZYRTEC ALLERGY) 10 MG CAPS Take 1 capsule (10 mg total) by mouth daily as  allergies). (Patient taking differently: Take 1 capsule by mouth daily. ) 30 capsule 11  °• diphenhydrAMINE (BENADRYL) 25 mg capsule Take 1 capsule (25 mg total) by mouth daily for 2 days.  0  °• famotidine (PEPCID) 20 MG tablet Take 1 tablet (20 mg total) by mouth daily. 30 tablet 0  °• guaiFENesin (MUCINEX) 600 MG 12 hr tablet Take 2 tablets (1,200 mg total) by mouth 2 (two) times daily for 5 days. 20 tablet 0  °• levalbuterol (XOPENEX HFA) 45 MCG/ACT inhaler Inhale 1-2 puffs into the lungs every 4 (four) hours as needed for wheezing or  shortness of breath. 1 Inhaler 0  °• levothyroxine (SYNTHROID) 112 MCG tablet Take 1 tablet (112 mcg total) by mouth daily before breakfast. 90 tablet 1  °• LORazepam (ATIVAN) 1 MG tablet Take 1 tablet about 30 min before wearing mask for brain treatment. Must have a driver after taking this. Take 1 tab PRN before MRIs in the future, if feeling anxious. (Patient taking differently: Take 1 mg by mouth See admin instructions. Take 1 mg by mouth approx 30 minutes before wearing mask for brain treatment/must have a driver after taking this med. Also, take 1 mg by mouth as needed before any future MRI(s) as needed for anxiousness) 3 tablet 0  °• magnesium citrate SOLN Take 0.5 Bottles by mouth daily as needed for moderate constipation or severe constipation.    °• [START ON 02/05/2019] osimertinib mesylate (TAGRISSO) 80 MG tablet Take 1 tablet (80 mg total) by mouth daily. 30 tablet 0  °• polyethylene glycol (MIRALAX / GLYCOLAX) packet Take 17 g by mouth daily.     °• sucralfate (CARAFATE) 1 GM/10ML suspension Take 10 mLs (1 g total) by mouth 4 (four) times daily -  with meals and at bedtime. (Patient not taking: Reported on 01/27/2019) 420 mL 0  ° °No current facility-administered medications for this visit.   ° ° °SURGICAL HISTORY:  °Past Surgical History:  °Procedure Laterality Date  °• COLONOSCOPY    ° x 2  °• IR PERC PLEURAL DRAIN W/INDWELL CATH W/IMG GUIDE  01/06/2019  °• TONSILLECTOMY    °• TUBAL LIGATION    ° ° °REVIEW OF SYSTEMS:  A comprehensive review of systems was negative except for: Constitutional: positive for fatigue °Respiratory: positive for dyspnea on exertion  ° °PHYSICAL EXAMINATION: General appearance: alert, cooperative, fatigued and no distress °Head: Normocephalic, without obvious abnormality, atraumatic °Neck: no adenopathy, no JVD, supple, symmetrical, trachea midline and thyroid not enlarged, symmetric, no tenderness/mass/nodules °Lymph nodes: Cervical, supraclavicular, and axillary nodes  normal. °Resp: clear to auscultation bilaterally °Back: symmetric, no curvature. ROM normal. No CVA tenderness. °Cardio: regular rate and rhythm, S1, S2 normal, no murmur, click, rub or gallop °GI: soft, non-tender; bowel sounds normal; no masses,  no organomegaly °Extremities: extremities normal, atraumatic, no cyanosis or edema ° °ECOG PERFORMANCE STATUS: 1 - Symptomatic but completely ambulatory ° °Blood pressure 107/63, pulse 77, temperature 98.2 °F (36.8 °C), temperature source Oral, resp. rate 18, height 5' 7" (1.702 m), weight 164 lb 9.6 oz (74.7 kg), SpO2 99 %. ° °LABORATORY DATA: °Lab Results  °Component Value Date  ° WBC 6.9 01/31/2019  ° HGB 11.1 (L) 01/31/2019  ° HCT 34.3 (L) 01/31/2019  ° MCV 95.5 01/31/2019  ° PLT 154 01/31/2019  ° ° °  Chemistry   °   °Component Value Date/Time  ° NA 139 01/30/2019 0402  ° K 4.5 01/30/2019 0402  ° CL 104 01/30/2019 0402  °   CO2 25 01/30/2019 0402  ° BUN 8 01/30/2019 0402  ° CREATININE 0.68 01/30/2019 0402  ° CREATININE 0.80 01/09/2019 1106  ° CREATININE 0.84 11/04/2018 0938  °    °Component Value Date/Time  ° CALCIUM 8.8 (L) 01/30/2019 0402  ° ALKPHOS 153 (H) 01/28/2019 0340  ° AST 16 01/28/2019 0340  ° AST 16 01/09/2019 1106  ° ALT 15 01/28/2019 0340  ° ALT 26 01/09/2019 1106  ° BILITOT 0.8 01/28/2019 0340  ° BILITOT 1.4 (H) 01/09/2019 1106  °  ° ° ° °RADIOGRAPHIC STUDIES: °Dg Chest 2 View ° °Result Date: 01/27/2019 °CLINICAL DATA:  Chest pain EXAM: CHEST - 2 VIEW COMPARISON:  CT chest 01/06/2019 FINDINGS: Left-sided chest tube is present. Small left pleural effusion. Left basilar airspace disease and mild left suprahilar airspace disease. No pneumothorax. Stable cardiomediastinal silhouette. No aggressive osseous lesion. IMPRESSION: 1. Left-sided chest tube without a pneumothorax. Small left pleural effusion. Left basilar airspace disease and left suprahilar airspace disease most concerning for atelectasis versus pneumonia. Electronically Signed   By: Hetal  Patel    On: 01/27/2019 17:58  ° °Dg Chest 2 View ° °Result Date: 01/04/2019 °CLINICAL DATA:  Malignant pleural effusion. EXAM: CHEST - 2 VIEW COMPARISON:  12/12/2018 FINDINGS: Increased densities throughout the left chest compatible with a large left pleural effusion. There appears to be mediastinal shift towards the right due to the large effusion. Left pleural effusion has enlarged from the prior imaging and there is a small amount of residual aeration in the left upper lung. Right lung is clear. No acute bone abnormality. Left side of the heart is obscured by the large left pleural effusion. IMPRESSION: Large left pleural effusion. Pleural effusion has enlarged since the PET-CT on 12/12/2018. Right mediastinal shift due to the large left pleural effusion. Electronically Signed   By: Adam  Henn M.D.   On: 01/04/2019 12:27  ° °Ct Angio Chest Pe W Or Wo Contrast ° °Result Date: 01/28/2019 °CLINICAL DATA:  57 y/o F; PE suspected, high pretest prob Look for recurrent PE, is on eliquis though: other DDx includes CAP, COVID, or ILD/Pneumonitis from Osimertinib. History of adenocarcinoma of the lung. EXAM: CT ANGIOGRAPHY CHEST WITH CONTRAST TECHNIQUE: Multidetector CT imaging of the chest was performed using the standard protocol during bolus administration of intravenous contrast. Multiplanar CT image reconstructions and MIPs were obtained to evaluate the vascular anatomy. CONTRAST:  100mL OMNIPAQUE IOHEXOL 350 MG/ML SOLN COMPARISON:  01/06/2019 CT chest. 12/12/2018 PET-CT. FINDINGS: Cardiovascular: Satisfactory opacification of the pulmonary arteries. No acute pulmonary embolus identified. Within the right lower lobe segmental pulmonary artery there is a linear opacity compatible with a small residual chronic embolus. Normal heart size. No pericardial effusion. Mild coronary artery calcific atherosclerosis. Normal caliber aorta and main pulmonary artery. Mediastinum/Nodes: No enlarged mediastinal, hilar, or axillary lymph nodes.  Borderline left supraclavicular lymph node measuring 11 mm short axis (series 7, image 32). Thyroid gland, trachea, and esophagus demonstrate no significant findings. Lungs/Pleura: Left suprahilar mass within the medial left upper lobe again noted. The margins of the mass are poorly delineated from associated postobstructive consolidation and atelectasis in the left upper lobe. The mass appears decreased in size best appreciated on the coronal reconstruction as demonstrated by decreased soft tissue within the adjacent mediastinum and local architectural distortion. There are decreased ground-glass opacities in the aerated portion of left upper lobe and the left lower lobe when compared with the prior study likely representing interval improvement of pneumonitis. Additionally, the left-sided pleural effusion is   much decreased in size. There is a residual posterior loculation pleural fluid lateral to the PleurX catheter. PleurX catheter tip is stable in position level of the left fourth posterior rib. Upper Abdomen: No acute abnormality. Musculoskeletal: Sclerotic osseous metastasis at base of left glenoid, multiple thoracic vertebral bodies, and ribs better characterized on prior PET-CT. Thoracic metastasis are best appreciated at T1, T3, T8, T9, T10. No acute fracture identified. Review of the MIP images confirms the above findings. IMPRESSION: 1. No acute pulmonary embolus identified. Small residual chronic pulmonary embolus within a right lower lobe segmental pulmonary artery. 2. Left suprahilar mass appears decreased in size from prior CTA of the chest. Near complete resolution of postobstructive pneumonitis and improved aeration of left lung. 3. Decreased size of left-sided pleural effusion. Residual loculated pleural fluid lateral to the PleurX catheter. 4. Stable borderline left supraclavicular lymph node, tracer avid on prior PET-CT. Stable osseous metastatic disease. Electronically Signed   By: Kristine Garbe M.D.   On: 01/28/2019 00:41   Ct Angio Chest Pe W And/or Wo Contrast  Result Date: 01/06/2019 CLINICAL DATA:  Dyspnea with stage IV lung cancer.  Hypoxia. EXAM: CT ANGIOGRAPHY CHEST WITH CONTRAST TECHNIQUE: Multidetector CT imaging of the chest was performed using the standard protocol during bolus administration of intravenous contrast. Multiplanar CT image reconstructions and MIPs were obtained to evaluate the vascular anatomy. CONTRAST:  173m OMNIPAQUE IOHEXOL 350 MG/ML SOLN COMPARISON:  Head CT 12/12/2018 and chest CT 11/11/2018 FINDINGS: Cardiovascular: Conventional branch pattern of the great vessels. Nonaneurysmal thoracic aorta. Acute pulmonary emboli noted on the right starting from the bifurcation of the right main pulmonary artery extending into the right upper and lower lobe lobar and segmental branches. Right heart strain is identified with RV/LV ratio 1.2. Mediastinum/Nodes: Small subcentimeter mediastinal lymph nodes with previously described metabolically active left-sided supraclavicular lymph nodes, the largest approximately 9 mm. No thyromegaly or mass. Patent trachea and mainstem bronchi with slight luminal narrowing of the distal left upper lobe bronchi from known mass and consolidation. Lungs/Pleura: Redemonstration of masslike opacity and postobstructive consolidation of the medial left upper lobe. The margins of the mass are difficult to identified due to the adjacent abutting pulmonary consolidations likely representing postobstructive change. The degree of postobstructive consolidation appears slightly increased since prior. Moderate to large left pleural effusion with adjacent left basilar compressive atelectasis with atelectasis along the left major fissure is identified. The volume of fluid in the left hemithorax has increased slightly from the November 11, 2018 exam though is decreased in appearance relative to the more recent prior PET-CT from 12/12/2018. The tip  of a Pleurx catheter terminates at the posterior fourth rib level. Patchy airspace disease in the left upper lobe, lingula and left lower lobe can not exclude superimposed pneumonia. Upper Abdomen: No acute abnormality. Musculoskeletal: Redemonstration of scattered osseous metastasis, at the base of the left glenoid, midthoracic spine and ribs better visualized on prior PET-CT. Review of the MIP images confirms the above findings. IMPRESSION: 1. Acute multilobar right-sided pulmonary emboli to the right upper and lower lobes with right heart strain, RV/LV ratio 1.2. 2. New patchy airspace opacities within the aerated left upper lobe, lingula and left lower lobe. Pneumonia is not excluded. 3. Redemonstration of left upper lobe mass with postobstructive consolidation and moderate to large left pleural effusion. PleurX catheter is noted within effusion. 4. Osseous metastatic disease to the dorsal spine, left scapula and ribs. These results were called by telephone at the time of interpretation on  01/06/2019 at 7:11 pm to Dr. Nanda Quinton , who verbally acknowledged these results. Electronically Signed   By: Ashley Royalty M.D.   On: 01/06/2019 19:14   Dg Chest Port 1 View  Result Date: 01/06/2019 CLINICAL DATA:  Status post left PleurX catheter placement for malignant pleural effusion secondary to adenocarcinoma of the lung. EXAM: PORTABLE CHEST 1 VIEW COMPARISON:  Film earlier today at 1228 hours FINDINGS: Left basilar PleurX catheter present. After fluid removal, there is reduction in amount of left pleural fluid volume with improved aeration of the left lung. Small amount of pleural fluid remains. No pneumothorax. Right lung remains clear. IMPRESSION: Decrease in left pleural fluid volume after placement of PleurX catheter and drainage of pleural fluid. No pneumothorax. Electronically Signed   By: Aletta Edouard M.D.   On: 01/06/2019 16:25   Dg Chest Portable 1 View  Result Date: 01/06/2019 CLINICAL DATA:   Shortness of breath, pleural effusion EXAM: PORTABLE CHEST 1 VIEW COMPARISON:  01/04/2019 FINDINGS: Large left pleural effusion unchanged with 01/04/2019. No right pleural effusion. No pneumothorax. Stable cardiomediastinal silhouette. No acute osseous abnormality. IMPRESSION: 1. Stable large left pleural effusion. Electronically Signed   By: Kathreen Devoid   On: 01/06/2019 12:49   Ir Perc Pleural Drain Genia Harold Cath W/img Guide  Result Date: 01/06/2019 INDICATION: 58 year old with lung cancer and malignant left pleural effusion. Patient was scheduled for a tunneled pleural catheter by Thoracic Surgery today at [facility]. Patient was at [facility] for radiation therapy but became short of breath and ended up in the Emergency Department. Interventional Radiology was consulted for placement of the tunneled pleural catheter. EXAM: PLACEMENT OF TUNNELED LEFT PLEURAL CATHETER WITH ULTRASOUND AND FLUOROSCOPIC GUIDANCE MEDICATIONS: Ancef 2 g ANESTHESIA/SEDATION: Fentanyl 50 mcg IV; Versed 1 mg IV Moderate Sedation Time:  24 minutes The patient was continuously monitored during the procedure by the interventional radiology nurse under my direct supervision. FLUOROSCOPY TIME:  30 seconds, 15 mGy COMPLICATIONS: None immediate. PROCEDURE: Informed written consent was obtained from the patient after a thorough discussion of the procedural risks, benefits and alternatives. All questions were addressed. Maximal Sterile Barrier Technique was utilized including caps, mask, sterile gowns, sterile gloves, sterile drape, hand hygiene and skin antiseptic. A timeout was performed prior to the initiation of the procedure. Ultrasound was used to identify a large amount of left pleural fluid. The left mid axillary line and left anterior lower chest area was prepped and draped in sterile fashion. Skin was anesthetized and 2 small incisions were made. 15.5 [place] PleurX catheter was tunneled between the incisions and  the cuff was placed underneath the skin. A Yueh catheter was directed into the pleural space along the more lateral incision with ultrasound guidance. Yellow fluid was aspirated. Wire was advanced into the pleural space and a peel-away sheath was placed. Catheter was directed through the peel-away sheath into the pleural space. 2 L of yellow pleural fluid was removed. Pleural entrance skin site was closed using absorbable suture and Dermabond. Catheter was sutured to skin with Prolene suture. Fluoroscopic and ultrasound images were taken and saved for documentation. IMPRESSION: Successful placement of a tunneled left pleural catheter with ultrasound and fluoroscopic guidance. Electronically Signed   By: Markus Daft M.D.   On: 01/06/2019 16:07    ASSESSMENT AND PLAN: This is a very pleasant 58 years old white female with a stage IV non-small cell lung cancer, adenocarcinoma with positive EGFR mutation in exon 21 ( L861Q) mutation as well  as  Exon 18 G719X mutation  °The patient is status post stereotactic radiotherapy to metastatic brain lesion as well as palliative radiotherapy to the left lung mass and bone metastasis. °She also had left Pleurx catheter placed recently. °I had a lengthy discussion with the patient today about her current disease stage, prognosis and treatment options.  The patient has positive for EGFR mutation in exon 21 as well as exon 18.   °The patient was started on treatment with Tagrisso 80 mg p.o. daily on January 13, 2019 and she has been tolerating this treatment well with no concerning adverse effects. °I recommended for her to resume her treatment with Tagrisso with the same dose. °For the recent pneumonia, the patient will continue with her current course of antibiotics with azithromycin. °I will see her back for follow-up visit in 2 weeks for evaluation and repeat blood work. °She was advised to call immediately if she has any concerning symptoms in the interval. °The patient voices  understanding of current disease status and treatment options and is in agreement with the current care plan. ° °All questions were answered. The patient knows to call the clinic with any problems, questions or concerns. We can certainly see the patient much sooner if necessary. ° °I spent 15 minutes counseling the patient face to face. The total time spent in the appointment was 25 minutes. ° °Disclaimer: This note was dictated with voice recognition software. Similar sounding words can inadvertently be transcribed and may not be corrected upon review. ° ° °  °  °

## 2019-02-01 ENCOUNTER — Telehealth: Payer: Self-pay | Admitting: Internal Medicine

## 2019-02-01 LAB — CULTURE, BLOOD (ROUTINE X 2)
Culture: NO GROWTH
Culture: NO GROWTH
Special Requests: ADEQUATE

## 2019-02-01 NOTE — Telephone Encounter (Signed)
Called regarding schedule °

## 2019-02-08 ENCOUNTER — Telehealth: Payer: Self-pay

## 2019-02-08 NOTE — Telephone Encounter (Signed)
Contacted pt to check to see if pt was experiencing any ongoing side effects from radiation to lung/t spine. Pt reports she is recovering from pneumonia. Pt reports that she is doing well and will contact Dr. Clabe Seal office if she would need a f/u appt. Pt verbalized understanding and agreement. Loma Sousa, RN BSN

## 2019-02-09 ENCOUNTER — Ambulatory Visit: Payer: Managed Care, Other (non HMO) | Admitting: Radiation Oncology

## 2019-02-13 ENCOUNTER — Encounter: Payer: Self-pay | Admitting: Family Medicine

## 2019-02-13 ENCOUNTER — Ambulatory Visit (INDEPENDENT_AMBULATORY_CARE_PROVIDER_SITE_OTHER): Payer: Managed Care, Other (non HMO) | Admitting: Family Medicine

## 2019-02-13 ENCOUNTER — Other Ambulatory Visit: Payer: Self-pay

## 2019-02-13 VITALS — BP 132/64 | HR 84 | Temp 98.4°F | Resp 16 | Ht 67.0 in | Wt 162.0 lb

## 2019-02-13 DIAGNOSIS — R05 Cough: Secondary | ICD-10-CM

## 2019-02-13 DIAGNOSIS — C3492 Malignant neoplasm of unspecified part of left bronchus or lung: Secondary | ICD-10-CM | POA: Diagnosis not present

## 2019-02-13 DIAGNOSIS — N390 Urinary tract infection, site not specified: Secondary | ICD-10-CM

## 2019-02-13 DIAGNOSIS — E079 Disorder of thyroid, unspecified: Secondary | ICD-10-CM | POA: Diagnosis not present

## 2019-02-13 DIAGNOSIS — E46 Unspecified protein-calorie malnutrition: Secondary | ICD-10-CM | POA: Insufficient documentation

## 2019-02-13 DIAGNOSIS — E43 Unspecified severe protein-calorie malnutrition: Secondary | ICD-10-CM

## 2019-02-13 DIAGNOSIS — R053 Chronic cough: Secondary | ICD-10-CM

## 2019-02-13 DIAGNOSIS — I2699 Other pulmonary embolism without acute cor pulmonale: Secondary | ICD-10-CM

## 2019-02-13 LAB — CBC WITH DIFFERENTIAL/PLATELET
Absolute Monocytes: 475 cells/uL (ref 200–950)
Basophils Absolute: 19 cells/uL (ref 0–200)
Basophils Relative: 0.5 %
Eosinophils Absolute: 61 cells/uL (ref 15–500)
Eosinophils Relative: 1.6 %
HCT: 32.5 % — ABNORMAL LOW (ref 35.0–45.0)
Hemoglobin: 11.2 g/dL — ABNORMAL LOW (ref 11.7–15.5)
Lymphs Abs: 566 cells/uL — ABNORMAL LOW (ref 850–3900)
MCH: 31.2 pg (ref 27.0–33.0)
MCHC: 34.5 g/dL (ref 32.0–36.0)
MCV: 90.5 fL (ref 80.0–100.0)
MPV: 11.2 fL (ref 7.5–12.5)
Monocytes Relative: 12.5 %
Neutro Abs: 2679 cells/uL (ref 1500–7800)
Neutrophils Relative %: 70.5 %
Platelets: 146 10*3/uL (ref 140–400)
RBC: 3.59 10*6/uL — ABNORMAL LOW (ref 3.80–5.10)
RDW: 15.4 % — ABNORMAL HIGH (ref 11.0–15.0)
Total Lymphocyte: 14.9 %
WBC: 3.8 10*3/uL (ref 3.8–10.8)

## 2019-02-13 LAB — BASIC METABOLIC PANEL
BUN: 8 mg/dL (ref 7–25)
CO2: 25 mmol/L (ref 20–32)
Calcium: 9.1 mg/dL (ref 8.6–10.4)
Chloride: 108 mmol/L (ref 98–110)
Creat: 0.63 mg/dL (ref 0.50–1.05)
Glucose, Bld: 90 mg/dL (ref 65–99)
Potassium: 4.2 mmol/L (ref 3.5–5.3)
Sodium: 142 mmol/L (ref 135–146)

## 2019-02-13 LAB — TSH: TSH: 2.66 mIU/L (ref 0.40–4.50)

## 2019-02-13 LAB — MICROSCOPIC MESSAGE

## 2019-02-13 LAB — URINALYSIS, ROUTINE W REFLEX MICROSCOPIC
Bacteria, UA: NONE SEEN /HPF
Bilirubin Urine: NEGATIVE
Glucose, UA: NEGATIVE
Hgb urine dipstick: NEGATIVE
Hyaline Cast: NONE SEEN /LPF
Ketones, ur: NEGATIVE
Nitrite: NEGATIVE
Protein, ur: NEGATIVE
RBC / HPF: NONE SEEN /HPF (ref 0–2)
Specific Gravity, Urine: 1.015 (ref 1.001–1.03)
pH: 6.5 (ref 5.0–8.0)

## 2019-02-13 LAB — T4, FREE: Free T4: 1.6 ng/dL (ref 0.8–1.8)

## 2019-02-13 LAB — T3, FREE: T3, Free: 2.9 pg/mL (ref 2.3–4.2)

## 2019-02-13 MED ORDER — BENZONATATE 100 MG PO CAPS: 100.0000 mg | ORAL_CAPSULE | Freq: Three times a day (TID) | ORAL | 0 refills | Status: DC | PRN

## 2019-02-13 MED ORDER — LEVALBUTEROL TARTRATE 45 MCG/ACT IN AERO: 1.0000 | INHALATION_SPRAY | RESPIRATORY_TRACT | 0 refills | Status: AC | PRN

## 2019-02-13 MED ORDER — CEPHALEXIN 500 MG PO CAPS: 500.0000 mg | ORAL_CAPSULE | Freq: Three times a day (TID) | ORAL | 0 refills | Status: DC

## 2019-02-13 NOTE — Progress Notes (Signed)
Subjective:    Patient ID: Tina Cruz, female    DOB: [birth year], 58 y.o.   MRN: [id number]  Patient presents for Hospital F/U (PNA); Urine Frequency (L sided lower back pain, frequency with urine); and Congestion (chest congestion, productive cough)  Patient here with her husband.  This supports, has seen her since her diagnosis of metastatic lung cancer.  She has mets to the bone in the brain further chart.  She is currently on chemotherapy she has completed radiation.  She has been hospitalized multiple times over the past few months.  She had pulmonary embolism currently on Xarelto.  She was recently hospitalized secondary to pneumonia.  She has completed her oral antibiotics.  She still has residual cough but usually only occurs at nighttime she does get some production with it.  She has not had any fever no change in her shortness of breath.  She is not requiring oxygen therapy. Not sure if there is anything she can take for the cough.  She does have a Xopenex inhaler at home but has not used this recently.  She does have a true allergy with hives to albuterol.  She has had left-sided flank pain urinary frequency for the past 3 days.  She has not noted any blood in the urine or the stool.  She does admit she has not been drinking very much she did not want to get up and down to the bathroom.  She is also eating very small meals as she still gets nausea if she eats too much.  Her chemotherapy pill causes her to have looser stools and nausea as well.  The Carafate did not help so she stopped this but she does take the Pepcid.  For her bowels she took MiraLAX a few days ago but now they are restarted though not quite diarrhea so she stopped taking this.  Reviewed her medical history and recent events in detail husband also at the bedside.  Hypothyroidism she is taking her Synthroid as prescribed however she has lost 30 pounds in the past 4 months secondary to her cancer and treatment.  She  is due for repeat thyroid function studies.   Review Of Systems:  GEN- +fatigue, fever, weight loss,weakness, recent illness HEENT- denies eye drainage, change in vision, nasal discharge, CVS- denies chest pain, palpitations RESP- denies SOB,+ cough, wheeze ABD- denies N/V, change in stools, abd pain GU- denies dysuria, hematuria, dribbling, incontinence MSK- denies joint pain, muscle aches, injury Neuro- denies headache, dizziness, syncope, seizure activity       Objective:    BP 132/64   Pulse 84   Temp 98.4 F (36.9 C) (Oral)   Resp 16   Ht 5\' 7"  (1.702 m)   Wt 162 lb (73.5 kg)   LMP  (LMP Unknown)   SpO2 96%   BMI 25.37 kg/m  GEN- NAD, alert and oriented x3 HEENT- PERRL, EOMI, non injected sclera, pink conjunctiva, MMM, oropharynx clear Neck- Supple, no thyromegaly CVS- RRR, no murmur RESP-CTAB ABD-NABS,soft,NT,ND EXT- No edema Pulses- Radial, DP- 2+        Assessment & Plan:      Problem List Items Addressed This Visit      Unprioritized   Adenocarcinoma of left lung, stage 4 (HCC) (Chronic)    Need oncology note.  She is currently on her chemotherapy medication.  I will reach out to see if there is a supplement with more protein that she can take that will not interfere with  this.  Trying to eat more and increase her hydration      Relevant Medications   cephALEXin (KEFLEX) 500 MG capsule   levalbuterol (XOPENEX HFA) 45 MCG/ACT inhaler   Other Relevant Orders   Basic metabolic panel   CBC with Differential/Platelet   Protein-calorie malnutrition ([place])   Pulmonary embolism (HCC)    No bleeding with the Xarelto.      Thyroid disease    adjust Synthroid as needed with her significant weight loss.      Relevant Orders   T3, free   T4, free   TSH    Other Visit Diagnoses    Urinary tract infection without hematuria, site unspecified    -  Primary   Start keflex, unable to do culture due to quantity of urine given   Relevant Medications    cephALEXin (KEFLEX) 500 MG capsule   Other Relevant Orders   Urinalysis, Routine w reflex microscopic (Completed)   Basic metabolic panel   CBC with Differential/Platelet   Microscopic Message (Completed)   Chronic cough       can use tessalon, called pharmacy will not interact with chemo med, xopenex prn      Note: This dictation was prepared with Dragon dictation along with smaller phrase technology. Any transcriptional errors that result from this process are unintentional.

## 2019-02-13 NOTE — Patient Instructions (Addendum)
F/U 3 months  Take the antibiotics Okay to use mucinex or the tessalon for cough

## 2019-02-13 NOTE — Assessment & Plan Note (Signed)
No bleeding with the Xarelto.

## 2019-02-13 NOTE — Assessment & Plan Note (Signed)
Need oncology note.  She is currently on her chemotherapy medication.  I will reach out to see if there is a supplement with more protein that she can take that will not interfere with this.  Trying to eat more and increase her hydration

## 2019-02-13 NOTE — Assessment & Plan Note (Addendum)
adjust Synthroid as needed with her significant weight loss.

## 2019-02-14 ENCOUNTER — Inpatient Hospital Stay: Payer: Managed Care, Other (non HMO) | Attending: Internal Medicine

## 2019-02-14 ENCOUNTER — Other Ambulatory Visit: Payer: Self-pay

## 2019-02-14 ENCOUNTER — Encounter: Payer: Self-pay | Admitting: Internal Medicine

## 2019-02-14 ENCOUNTER — Inpatient Hospital Stay (HOSPITAL_BASED_OUTPATIENT_CLINIC_OR_DEPARTMENT_OTHER): Payer: Managed Care, Other (non HMO) | Admitting: Internal Medicine

## 2019-02-14 DIAGNOSIS — K219 Gastro-esophageal reflux disease without esophagitis: Secondary | ICD-10-CM | POA: Insufficient documentation

## 2019-02-14 DIAGNOSIS — J90 Pleural effusion, not elsewhere classified: Secondary | ICD-10-CM

## 2019-02-14 DIAGNOSIS — M545 Low back pain: Secondary | ICD-10-CM | POA: Insufficient documentation

## 2019-02-14 DIAGNOSIS — F329 Major depressive disorder, single episode, unspecified: Secondary | ICD-10-CM | POA: Insufficient documentation

## 2019-02-14 DIAGNOSIS — R0602 Shortness of breath: Secondary | ICD-10-CM

## 2019-02-14 DIAGNOSIS — N390 Urinary tract infection, site not specified: Secondary | ICD-10-CM | POA: Insufficient documentation

## 2019-02-14 DIAGNOSIS — Z79899 Other long term (current) drug therapy: Secondary | ICD-10-CM | POA: Diagnosis not present

## 2019-02-14 DIAGNOSIS — C7931 Secondary malignant neoplasm of brain: Secondary | ICD-10-CM | POA: Diagnosis not present

## 2019-02-14 DIAGNOSIS — C778 Secondary and unspecified malignant neoplasm of lymph nodes of multiple regions: Secondary | ICD-10-CM

## 2019-02-14 DIAGNOSIS — C7951 Secondary malignant neoplasm of bone: Secondary | ICD-10-CM

## 2019-02-14 DIAGNOSIS — C782 Secondary malignant neoplasm of pleura: Secondary | ICD-10-CM

## 2019-02-14 DIAGNOSIS — Z7901 Long term (current) use of anticoagulants: Secondary | ICD-10-CM | POA: Insufficient documentation

## 2019-02-14 DIAGNOSIS — C3490 Malignant neoplasm of unspecified part of unspecified bronchus or lung: Secondary | ICD-10-CM

## 2019-02-14 DIAGNOSIS — C3412 Malignant neoplasm of upper lobe, left bronchus or lung: Secondary | ICD-10-CM | POA: Insufficient documentation

## 2019-02-14 DIAGNOSIS — E039 Hypothyroidism, unspecified: Secondary | ICD-10-CM | POA: Diagnosis not present

## 2019-02-14 DIAGNOSIS — C7971 Secondary malignant neoplasm of right adrenal gland: Secondary | ICD-10-CM | POA: Diagnosis not present

## 2019-02-14 DIAGNOSIS — C3492 Malignant neoplasm of unspecified part of left bronchus or lung: Secondary | ICD-10-CM

## 2019-02-14 LAB — CMP (CANCER CENTER ONLY)
ALT: 15 U/L (ref 0–44)
AST: 21 U/L (ref 15–41)
Albumin: 3.3 g/dL — ABNORMAL LOW (ref 3.5–5.0)
Alkaline Phosphatase: 115 U/L (ref 38–126)
Anion gap: 9 (ref 5–15)
BUN: 6 mg/dL (ref 6–20)
CO2: 25 mmol/L (ref 22–32)
Calcium: 8.8 mg/dL — ABNORMAL LOW (ref 8.9–10.3)
Chloride: 109 mmol/L (ref 98–111)
Creatinine: 0.69 mg/dL (ref 0.44–1.00)
GFR, Est AFR Am: >60 mL/min (ref >60)
GFR, Estimated: >60 mL/min (ref >60)
Glucose, Bld: 88 mg/dL (ref 70–99)
Potassium: 3.5 mmol/L (ref 3.5–5.1)
Sodium: 143 mmol/L (ref 135–145)
Total Bilirubin: 1.3 mg/dL — ABNORMAL HIGH (ref 0.3–1.2)
Total Protein: 5.9 g/dL — ABNORMAL LOW (ref 6.5–8.1)

## 2019-02-14 LAB — CBC WITH DIFFERENTIAL (CANCER CENTER ONLY)
Abs Immature Granulocytes: 0.01 10*3/uL (ref 0.00–0.07)
Basophils Absolute: 0 10*3/uL (ref 0.0–0.1)
Basophils Relative: 0 %
Eosinophils Absolute: 0.1 10*3/uL (ref 0.0–0.5)
Eosinophils Relative: 2 %
HCT: 31.4 % — ABNORMAL LOW (ref 36.0–46.0)
Hemoglobin: 10.3 g/dL — ABNORMAL LOW (ref 12.0–15.0)
Immature Granulocytes: 0 %
Lymphocytes Relative: 18 %
Lymphs Abs: 0.5 10*3/uL — ABNORMAL LOW (ref 0.7–4.0)
MCH: 30.7 pg (ref 26.0–34.0)
MCHC: 32.8 g/dL (ref 30.0–36.0)
MCV: 93.5 fL (ref 80.0–100.0)
Monocytes Absolute: 0.4 10*3/uL (ref 0.1–1.0)
Monocytes Relative: 13 %
Neutro Abs: 1.9 10*3/uL (ref 1.7–7.7)
Neutrophils Relative %: 67 %
Platelet Count: 122 10*3/uL — ABNORMAL LOW (ref 150–400)
RBC: 3.36 MIL/uL — ABNORMAL LOW (ref 3.87–5.11)
RDW: 15.7 % — ABNORMAL HIGH (ref 11.5–15.5)
WBC Count: 2.9 10*3/uL — ABNORMAL LOW (ref 4.0–10.5)
nRBC: 0 % (ref 0.0–0.2)

## 2019-02-14 NOTE — Progress Notes (Signed)
[place] Telephone:([phone]) [phone]   [phone]  OFFICE PROGRESS NOTE  [name], MD [address] [place] [address]  DIAGNOSIS: stage IV (T2b, N3, M1 C) non-small cell lung cancer, adenocarcinoma presented with left suprahilar mass with postobstructive collapse of the left upper lobe in addition to mediastinal and left supraclavicular lymphadenopathy as well as malignant left pleural effusion with pleural-based metastasis and metastatic disease to the bone, right adrenal gland and brain diagnosed in February 2020. Molecular studies: POSITIVE for the Exon 21 [id number] mutation  POSITIVE for the Exon 18 [id number] mutation  NEGATIVE for the Exon 20 T790M mutation   PRIOR THERAPY: 1) palliative radiotherapy to the brain as well as the metastatic bone disease and the obstructive left suprahilar mass. 2) left Pleurx catheter placement under the care of Dr. Prescott Gum.  CURRENT THERAPY: Tagrisso 80 mg p.o. daily.  First dose started January 13, 2019 INTERVAL HISTORY: Tina Cruz 58 y.o. female returns to the clinic today for follow-up visit.  The patient is feeling fine today with no concerning complaints except for mild low back pain.  She was recently diagnosed with urinary tract infection and currently undergoing treatment with antibiotics.  She denied having any chest pain, shortness of breath, cough or hemoptysis.  She denied having any fever or chills.  She has no nausea, vomiting, diarrhea or constipation.  She denied having any recent weight loss or night sweats.  The patient is here today for evaluation and repeat blood work.  MEDICAL HISTORY: Past Medical History:  Diagnosis Date   Active smoker    quit 2016   Cancer [facility])    left lung stage IV non-cell carcinoma/adenocarcinoma with diagnosis made from left pleural effusion.     Cancer ([place])    Brain metastasis    Constipation    Depression    Dyspnea    GERD (gastroesophageal  reflux disease)    H/O chronic bronchitis    Hypothyroidism    Seasonal allergies    SVD (spontaneous vaginal delivery)    x 2   Thyroid disease    hypothyroid   Wears partial dentures     ALLERGIES:  is allergic to albuterol.  MEDICATIONS:  Current Outpatient Medications  Medication Sig Dispense Refill   acetaminophen (TYLENOL) 325 MG tablet Take 325 mg by mouth every 6 (six) hours as needed for mild pain or headache.     apixaban (ELIQUIS) 5 MG TABS tablet Take 1 tablet (5 mg total) by mouth 2 (two) times daily. 60 tablet 1   azelastine (ASTELIN) 0.1 % nasal spray Place 1 spray into both nostrils 2 (two) times daily as needed for rhinitis. Use in each nostril as directed (Patient taking differently: Place 1 spray into both nostrils 2 (two) times daily as needed for rhinitis. ) 30 mL 12   benzonatate (TESSALON) 100 MG capsule Take 1 capsule (100 mg total) by mouth 3 (three) times daily as needed for cough. 20 capsule 0   cephALEXin (KEFLEX) 500 MG capsule Take 1 capsule (500 mg total) by mouth 3 (three) times daily. 15 capsule 0   Cetirizine HCl (ZYRTEC ALLERGY) 10 MG CAPS Take 1 capsule (10 mg total) by mouth daily as needed (allergies). (Patient taking differently: Take 1 capsule by mouth daily. ) 30 capsule 11   famotidine (PEPCID) 20 MG tablet Take 1 tablet (20 mg total) by mouth daily. 30 tablet 0   levalbuterol (XOPENEX  HFA) 45 MCG/ACT inhaler Inhale 1-2 puffs into the lungs every 4 (four) hours as needed for wheezing or shortness of breath. 1 Inhaler 0   levothyroxine (SYNTHROID) 112 MCG tablet Take 1 tablet (112 mcg total) by mouth daily before breakfast. 90 tablet 1   magnesium citrate SOLN Take 0.5 Bottles by mouth daily as needed for moderate constipation or severe constipation.     osimertinib mesylate (TAGRISSO) 80 MG tablet Take 1 tablet (80 mg total) by mouth daily. 30 tablet 0   polyethylene glycol (MIRALAX / GLYCOLAX) packet Take 17 g by mouth daily.        sucralfate (CARAFATE) 1 GM/10ML suspension Take 10 mLs (1 g total) by mouth 4 (four) times daily -  with meals and at bedtime. (Patient not taking: Reported on 02/13/2019) 420 mL 0   No current facility-administered medications for this visit.     SURGICAL HISTORY:  Past Surgical History:  Procedure Laterality Date   COLONOSCOPY     x 2   IR PERC PLEURAL DRAIN W/INDWELL CATH W/IMG GUIDE  01/06/2019   TONSILLECTOMY     TUBAL LIGATION      REVIEW OF SYSTEMS:  A comprehensive review of systems was negative except for: Musculoskeletal: positive for back pain   PHYSICAL EXAMINATION: General appearance: alert, cooperative, fatigued and no distress Head: Normocephalic, without obvious abnormality, atraumatic Neck: no adenopathy, no JVD, supple, symmetrical, trachea midline and thyroid not enlarged, symmetric, no tenderness/mass/nodules Lymph nodes: Cervical, supraclavicular, and axillary nodes normal. Resp: clear to auscultation bilaterally Back: symmetric, no curvature. ROM normal. No CVA tenderness. Cardio: regular rate and rhythm, S1, S2 normal, no murmur, click, rub or gallop GI: soft, non-tender; bowel sounds normal; no masses,  no organomegaly Extremities: extremities normal, atraumatic, no cyanosis or edema  ECOG PERFORMANCE STATUS: 1 - Symptomatic but completely ambulatory  There were no vitals taken for this visit.  LABORATORY DATA: Lab Results  Component Value Date   WBC 2.9 (L) 02/14/2019   HGB 10.3 (L) 02/14/2019   HCT 31.4 (L) 02/14/2019   MCV 93.5 02/14/2019   PLT 122 (L) 02/14/2019      Chemistry      Component Value Date/Time   NA 142 02/13/2019 1302   K 4.2 02/13/2019 1302   CL 108 02/13/2019 1302   CO2 25 02/13/2019 1302   BUN 8 02/13/2019 1302   CREATININE 0.63 02/13/2019 1302      Component Value Date/Time   CALCIUM 9.1 02/13/2019 1302   ALKPHOS 161 (H) 01/31/2019 1326   AST 26 01/31/2019 1326   ALT 20 01/31/2019 1326   BILITOT 0.7 01/31/2019  1326       RADIOGRAPHIC STUDIES: Dg Chest 2 View  Result Date: 01/27/2019 CLINICAL DATA:  Chest pain EXAM: CHEST - 2 VIEW COMPARISON:  CT chest 01/06/2019 FINDINGS: Left-sided chest tube is present. Small left pleural effusion. Left basilar airspace disease and mild left suprahilar airspace disease. No pneumothorax. Stable cardiomediastinal silhouette. No aggressive osseous lesion. IMPRESSION: 1. Left-sided chest tube without a pneumothorax. Small left pleural effusion. Left basilar airspace disease and left suprahilar airspace disease most concerning for atelectasis versus pneumonia. Electronically Signed   By: Kathreen Devoid   On: 01/27/2019 17:58   Ct Angio Chest Pe W Or Wo Contrast  Result Date: 01/28/2019 CLINICAL DATA:  58 y/o F; PE suspected, high pretest prob Look for recurrent PE, is on eliquis though: other DDx includes CAP, COVID, or ILD/Pneumonitis from Osimertinib. History of adenocarcinoma of the  lung. EXAM: CT ANGIOGRAPHY CHEST WITH CONTRAST TECHNIQUE: Multidetector CT imaging of the chest was performed using the standard protocol during bolus administration of intravenous contrast. Multiplanar CT image reconstructions and MIPs were obtained to evaluate the vascular anatomy. CONTRAST:  117m OMNIPAQUE IOHEXOL 350 MG/ML SOLN COMPARISON:  01/06/2019 CT chest. 12/12/2018 PET-CT. FINDINGS: Cardiovascular: Satisfactory opacification of the pulmonary arteries. No acute pulmonary embolus identified. Within the right lower lobe segmental pulmonary artery there is a linear opacity compatible with a small residual chronic embolus. Normal heart size. No pericardial effusion. Mild coronary artery calcific atherosclerosis. Normal caliber aorta and main pulmonary artery. Mediastinum/Nodes: No enlarged mediastinal, hilar, or axillary lymph nodes. Borderline left supraclavicular lymph node measuring 11 mm short axis (series 7, image 32). Thyroid gland, trachea, and esophagus demonstrate no significant  findings. Lungs/Pleura: Left suprahilar mass within the medial left upper lobe again noted. The margins of the mass are poorly delineated from associated postobstructive consolidation and atelectasis in the left upper lobe. The mass appears decreased in size best appreciated on the coronal reconstruction as demonstrated by decreased soft tissue within the adjacent mediastinum and local architectural distortion. There are decreased ground-glass opacities in the aerated portion of left upper lobe and the left lower lobe when compared with the prior study likely representing interval improvement of pneumonitis. Additionally, the left-sided pleural effusion is much decreased in size. There is a residual posterior loculation pleural fluid lateral to the PleurX catheter. PleurX catheter tip is stable in position level of the left fourth posterior rib. Upper Abdomen: No acute abnormality. Musculoskeletal: Sclerotic osseous metastasis at base of left glenoid, multiple thoracic vertebral bodies, and ribs better characterized on prior PET-CT. Thoracic metastasis are best appreciated at T1, T3, T8, T9, T10. No acute fracture identified. Review of the MIP images confirms the above findings. IMPRESSION: 1. No acute pulmonary embolus identified. Small residual chronic pulmonary embolus within a right lower lobe segmental pulmonary artery. 2. Left suprahilar mass appears decreased in size from prior CTA of the chest. Near complete resolution of postobstructive pneumonitis and improved aeration of left lung. 3. Decreased size of left-sided pleural effusion. Residual loculated pleural fluid lateral to the PleurX catheter. 4. Stable borderline left supraclavicular lymph node, tracer avid on prior PET-CT. Stable osseous metastatic disease. Electronically Signed   By: LKristine GarbeM.D.   On: 01/28/2019 00:41    ASSESSMENT AND PLAN: This is a very pleasant 58years old white female with a stage IV non-small cell lung  cancer, adenocarcinoma with positive EGFR mutation in exon 21 ( L861Q) mutation as well as  Exon [id number] mutation  The patient is status post stereotactic radiotherapy to metastatic brain lesion as well as palliative radiotherapy to the left lung mass and bone metastasis. She also had left Pleurx catheter placed recently. I had a lengthy discussion with the patient today about her current disease stage, prognosis and treatment options.  The patient has positive for EGFR mutation in exon 21 as well as exon 18.   The patient was started on treatment with Tagrisso 80 mg p.o. daily on January 13, 2019. The patient continues to tolerate this treatment well with no concerning adverse effects. I recommended for her to continue her current treatment with Tagrisso. I will see her back for follow-up visit in 1 months for evaluation with repeat CT scan of the chest, abdomen and pelvis for restaging of her disease. The patient was advised to call immediately if she has any concerning symptoms in the interval.  The patient voices understanding of current disease status and treatment options and is in agreement with the current care plan. All questions were answered. The patient knows to call the clinic with any problems, questions or concerns. We can certainly see the patient much sooner if necessary.  I spent 10 minutes counseling the patient face to face. The total time spent in the appointment was 15 minutes.  Disclaimer: This note was dictated with voice recognition software. Similar sounding words can inadvertently be transcribed and may not be corrected upon review.

## 2019-02-15 ENCOUNTER — Telehealth: Payer: Self-pay | Admitting: Internal Medicine

## 2019-02-15 NOTE — Telephone Encounter (Signed)
Scheduled appt per 5/5 los - sent reminder letter in the mail . F/u in one month.

## 2019-02-22 ENCOUNTER — Other Ambulatory Visit (HOSPITAL_COMMUNITY): Payer: Self-pay | Admitting: Diagnostic Radiology

## 2019-02-22 DIAGNOSIS — J90 Pleural effusion, not elsewhere classified: Secondary | ICD-10-CM

## 2019-02-23 ENCOUNTER — Other Ambulatory Visit: Payer: Self-pay

## 2019-02-23 ENCOUNTER — Encounter (HOSPITAL_COMMUNITY): Payer: Self-pay | Admitting: Radiology

## 2019-02-23 ENCOUNTER — Ambulatory Visit (HOSPITAL_COMMUNITY)
Admission: RE | Admit: 2019-02-23 | Discharge: 2019-02-23 | Disposition: A | Discharge status: Home / Self Care | Payer: Managed Care, Other (non HMO) | Source: Ambulatory Visit | Attending: Diagnostic Radiology | Admitting: Diagnostic Radiology

## 2019-02-23 DIAGNOSIS — Z4682 Encounter for fitting and adjustment of non-vascular catheter: Secondary | ICD-10-CM | POA: Diagnosis present

## 2019-02-23 DIAGNOSIS — J90 Pleural effusion, not elsewhere classified: Secondary | ICD-10-CM | POA: Diagnosis not present

## 2019-02-23 HISTORY — PX: IR REMOVAL OF PLURAL CATH W/CUFF: IMG5346

## 2019-02-23 MED ORDER — LIDOCAINE HCL 1 % IJ SOLN
INTRAMUSCULAR | Status: AC
  Filled 2019-02-23: qty 20

## 2019-02-23 MED ORDER — CHLORHEXIDINE GLUCONATE 4 % EX LIQD
CUTANEOUS | Status: AC
  Filled 2019-02-23: qty 15

## 2019-02-23 MED ORDER — LIDOCAINE HCL 1 % IJ SOLN
INTRAMUSCULAR | Status: AC | PRN
  Administered 2019-02-23: 10 mL

## 2019-02-23 NOTE — Progress Notes (Signed)
Successful removal of tunneled Left PleurX catheter. No complications.  Ascencion Dike PA-C Interventional Radiology 02/23/2019 10:09 AM

## 2019-03-07 NOTE — Progress Notes (Incomplete)
°  Radiation Oncology         [phone]) [phone] ________________________________  Name: Tina Cruz MRN: [id number]  Date: 12/30/2018  DOB: [birth year]  S[facility] End of Treatment Note  Diagnosis:   58 y.o. female with Stage IV (T2b, N3, M1 C) non-small cell lung cancer, adenocarcinomawith lymphadenopathy and metastases to bone, right adrenal gland, and brain   Indication for treatment:  palliative       Radiation treatment dates:   12/30/2018  Site/dose:    1. PTV1 Rt Post Frontal 2mm // 20 Gy in 1 fraction, max dose=126.8% 2. PTV2 Lt Frontal 49mm // 20 Gy in 1 fraction, Max dose=123.7%  Beams/energy:    1. ExacTrac SBRT/SRT-VMAT, 4 vmat beams // 6FFF Photon 2. ExacTrac SBRT/SRT-3D, 4 DCA beams // 6FFF Photon  Narrative: The patient tolerated radiation treatment well.   There were no signs of acute toxicity after treatment.  Plan: The patient has completed radiation treatment. The patient will return to radiation oncology clinic for routine followup with Dr. Sondra Come in one month. I advised the patient to call or return sooner if they have any questions or concerns related to their recovery or treatment. ________________________________  Eppie Gibson, MD  This document serves as a record of services personally performed by Eppie Gibson, MD. It was created on her behalf by Rae Lips, a trained medical scribe. The creation of this record is based on the scribe's personal observations and the provider's statements to them. This document has been checked and approved by the attending provider.

## 2019-03-15 ENCOUNTER — Other Ambulatory Visit: Payer: Self-pay | Admitting: Medical Oncology

## 2019-03-15 DIAGNOSIS — C7951 Secondary malignant neoplasm of bone: Secondary | ICD-10-CM

## 2019-03-15 DIAGNOSIS — R531 Weakness: Secondary | ICD-10-CM

## 2019-03-15 DIAGNOSIS — C3492 Malignant neoplasm of unspecified part of left bronchus or lung: Secondary | ICD-10-CM

## 2019-03-16 ENCOUNTER — Telehealth: Payer: Self-pay | Admitting: Medical Oncology

## 2019-03-16 NOTE — Telephone Encounter (Addendum)
Pt said her insurance rep told her they need 48 hours to review infomration before making a determination. I told pt to get her labs tomorrow and keep her appt with [facility] next week. CT will be rescheduled

## 2019-03-16 NOTE — Telephone Encounter (Signed)
err

## 2019-03-16 NOTE — Telephone Encounter (Signed)
r 

## 2019-03-16 NOTE — Telephone Encounter (Signed)
I told pt her scan is not authorized.  Pt said 'I am calling my rep ,Lovey Newcomer , and tell her".   I instructed her to call central scheduling after.

## 2019-03-17 ENCOUNTER — Inpatient Hospital Stay: Payer: Managed Care, Other (non HMO) | Attending: Internal Medicine

## 2019-03-17 ENCOUNTER — Other Ambulatory Visit: Payer: Self-pay

## 2019-03-17 ENCOUNTER — Ambulatory Visit (HOSPITAL_COMMUNITY): Admission: RE | Admit: 2019-03-17 | Payer: Managed Care, Other (non HMO) | Source: Ambulatory Visit

## 2019-03-17 DIAGNOSIS — K5900 Constipation, unspecified: Secondary | ICD-10-CM | POA: Insufficient documentation

## 2019-03-17 DIAGNOSIS — C3490 Malignant neoplasm of unspecified part of unspecified bronchus or lung: Secondary | ICD-10-CM

## 2019-03-17 DIAGNOSIS — R0602 Shortness of breath: Secondary | ICD-10-CM | POA: Diagnosis not present

## 2019-03-17 DIAGNOSIS — Z7901 Long term (current) use of anticoagulants: Secondary | ICD-10-CM | POA: Diagnosis not present

## 2019-03-17 DIAGNOSIS — K219 Gastro-esophageal reflux disease without esophagitis: Secondary | ICD-10-CM | POA: Insufficient documentation

## 2019-03-17 DIAGNOSIS — C3412 Malignant neoplasm of upper lobe, left bronchus or lung: Secondary | ICD-10-CM | POA: Diagnosis present

## 2019-03-17 DIAGNOSIS — Z79899 Other long term (current) drug therapy: Secondary | ICD-10-CM | POA: Diagnosis not present

## 2019-03-17 DIAGNOSIS — M549 Dorsalgia, unspecified: Secondary | ICD-10-CM | POA: Diagnosis not present

## 2019-03-17 DIAGNOSIS — C7931 Secondary malignant neoplasm of brain: Secondary | ICD-10-CM | POA: Diagnosis not present

## 2019-03-17 DIAGNOSIS — C782 Secondary malignant neoplasm of pleura: Secondary | ICD-10-CM | POA: Insufficient documentation

## 2019-03-17 DIAGNOSIS — C7971 Secondary malignant neoplasm of right adrenal gland: Secondary | ICD-10-CM | POA: Diagnosis not present

## 2019-03-17 DIAGNOSIS — F329 Major depressive disorder, single episode, unspecified: Secondary | ICD-10-CM | POA: Insufficient documentation

## 2019-03-17 DIAGNOSIS — C7951 Secondary malignant neoplasm of bone: Secondary | ICD-10-CM | POA: Insufficient documentation

## 2019-03-17 DIAGNOSIS — R5383 Other fatigue: Secondary | ICD-10-CM | POA: Insufficient documentation

## 2019-03-17 DIAGNOSIS — E039 Hypothyroidism, unspecified: Secondary | ICD-10-CM | POA: Diagnosis not present

## 2019-03-17 LAB — CBC WITH DIFFERENTIAL (CANCER CENTER ONLY)
Abs Immature Granulocytes: 0.01 10*3/uL (ref 0.00–0.07)
Basophils Absolute: 0 10*3/uL (ref 0.0–0.1)
Basophils Relative: 0 %
Eosinophils Absolute: 0.1 10*3/uL (ref 0.0–0.5)
Eosinophils Relative: 2 %
HCT: 38.8 % (ref 36.0–46.0)
Hemoglobin: 12.6 g/dL (ref 12.0–15.0)
Immature Granulocytes: 0 %
Lymphocytes Relative: 21 %
Lymphs Abs: 1.1 10*3/uL (ref 0.7–4.0)
MCH: 30.3 pg (ref 26.0–34.0)
MCHC: 32.5 g/dL (ref 30.0–36.0)
MCV: 93.3 fL (ref 80.0–100.0)
Monocytes Absolute: 0.7 10*3/uL (ref 0.1–1.0)
Monocytes Relative: 13 %
Neutro Abs: 3.4 10*3/uL (ref 1.7–7.7)
Neutrophils Relative %: 64 %
Platelet Count: 182 10*3/uL (ref 150–400)
RBC: 4.16 MIL/uL (ref 3.87–5.11)
RDW: 13.6 % (ref 11.5–15.5)
WBC Count: 5.3 10*3/uL (ref 4.0–10.5)
nRBC: 0 % (ref 0.0–0.2)

## 2019-03-17 LAB — CMP (CANCER CENTER ONLY)
ALT: 13 U/L (ref 0–44)
AST: 20 U/L (ref 15–41)
Albumin: 3.6 g/dL (ref 3.5–5.0)
Alkaline Phosphatase: 83 U/L (ref 38–126)
Anion gap: 8 (ref 5–15)
BUN: 9 mg/dL (ref 6–20)
CO2: 24 mmol/L (ref 22–32)
Calcium: 9.4 mg/dL (ref 8.9–10.3)
Chloride: 107 mmol/L (ref 98–111)
Creatinine: 0.79 mg/dL (ref 0.44–1.00)
GFR, Est AFR Am: >60 mL/min (ref >60)
GFR, Estimated: >60 mL/min (ref >60)
Glucose, Bld: 100 mg/dL — ABNORMAL HIGH (ref 70–99)
Potassium: 3.8 mmol/L (ref 3.5–5.1)
Sodium: 139 mmol/L (ref 135–145)
Total Bilirubin: 0.8 mg/dL (ref 0.3–1.2)
Total Protein: 6.6 g/dL (ref 6.5–8.1)

## 2019-03-20 ENCOUNTER — Other Ambulatory Visit: Payer: Self-pay

## 2019-03-20 ENCOUNTER — Encounter: Payer: Self-pay | Admitting: Internal Medicine

## 2019-03-20 ENCOUNTER — Inpatient Hospital Stay (HOSPITAL_BASED_OUTPATIENT_CLINIC_OR_DEPARTMENT_OTHER): Payer: Managed Care, Other (non HMO) | Admitting: Internal Medicine

## 2019-03-20 ENCOUNTER — Other Ambulatory Visit: Payer: Self-pay | Admitting: Medical Oncology

## 2019-03-20 ENCOUNTER — Telehealth: Payer: Self-pay | Admitting: Medical Oncology

## 2019-03-20 VITALS — BP 111/65 | HR 98 | Temp 99.4°F | Resp 18 | Ht 67.0 in | Wt 157.4 lb

## 2019-03-20 DIAGNOSIS — C7971 Secondary malignant neoplasm of right adrenal gland: Secondary | ICD-10-CM

## 2019-03-20 DIAGNOSIS — C7951 Secondary malignant neoplasm of bone: Secondary | ICD-10-CM

## 2019-03-20 DIAGNOSIS — K5900 Constipation, unspecified: Secondary | ICD-10-CM

## 2019-03-20 DIAGNOSIS — R0602 Shortness of breath: Secondary | ICD-10-CM

## 2019-03-20 DIAGNOSIS — C3492 Malignant neoplasm of unspecified part of left bronchus or lung: Secondary | ICD-10-CM

## 2019-03-20 DIAGNOSIS — C782 Secondary malignant neoplasm of pleura: Secondary | ICD-10-CM | POA: Diagnosis not present

## 2019-03-20 DIAGNOSIS — C3412 Malignant neoplasm of upper lobe, left bronchus or lung: Secondary | ICD-10-CM | POA: Diagnosis not present

## 2019-03-20 DIAGNOSIS — F329 Major depressive disorder, single episode, unspecified: Secondary | ICD-10-CM

## 2019-03-20 DIAGNOSIS — Z5111 Encounter for antineoplastic chemotherapy: Secondary | ICD-10-CM

## 2019-03-20 DIAGNOSIS — R531 Weakness: Secondary | ICD-10-CM

## 2019-03-20 DIAGNOSIS — C7931 Secondary malignant neoplasm of brain: Secondary | ICD-10-CM

## 2019-03-20 DIAGNOSIS — R5383 Other fatigue: Secondary | ICD-10-CM

## 2019-03-20 DIAGNOSIS — K219 Gastro-esophageal reflux disease without esophagitis: Secondary | ICD-10-CM

## 2019-03-20 DIAGNOSIS — E039 Hypothyroidism, unspecified: Secondary | ICD-10-CM

## 2019-03-20 DIAGNOSIS — Z7901 Long term (current) use of anticoagulants: Secondary | ICD-10-CM

## 2019-03-20 DIAGNOSIS — M549 Dorsalgia, unspecified: Secondary | ICD-10-CM

## 2019-03-20 NOTE — Progress Notes (Signed)
[place] [phone]   [phone]  OFFICE PROGRESS NOTE  [name], MD [address] [place] [address]  DIAGNOSIS: stage IV (T2b, N3, M1 C) non-small cell lung cancer, adenocarcinoma presented with left suprahilar mass with postobstructive collapse of the left upper lobe in addition to mediastinal and left supraclavicular lymphadenopathy as well as malignant left pleural effusion with pleural-based metastasis and metastatic disease to the bone, right adrenal gland and brain diagnosed in February 2020. Molecular studies: POSITIVE for the Exon 21 [id number] mutation  POSITIVE for the Exon 18 [id number] mutation  NEGATIVE for the Exon 20 T790M mutation   PRIOR THERAPY: 1) palliative radiotherapy to the brain as well as the metastatic bone disease and the obstructive left suprahilar mass. 2) left Pleurx catheter placement under the care of Dr. Prescott Gum.  CURRENT THERAPY: Tagrisso 80 mg p.o. daily.  First dose started January 13, 2019.  Status post 2 months of treatment.  INTERVAL HISTORY: Tina Cruz 58 y.o. female returns to the clinic today for follow-up visit.  The patient is feeling fine today with no concerning complaints except for increasing fatigue and back pain.  She has hard time standing in the shower because of her pain.  She is interested in having a shower chair.  She denied having any current chest pain, shortness of breath, cough or hemoptysis.  She denied having any fever or chills.  She has no nausea, vomiting, diarrhea or constipation.  She denied having any headache or visual changes.  She continues to tolerate her treatment with Tagrisso fairly well.  She was supposed to have repeat CT scan of the chest performed before this visit but unfortunately she did not have the insurance approval on time and her scan is scheduled to be done on March 24, 2019.  MEDICAL HISTORY: Past Medical History:  Diagnosis Date  . Active  smoker    quit 2016  . Cancer ([facility])    left lung stage IV non-cell carcinoma/adenocarcinoma with diagnosis made from left pleural effusion.    . Cancer ([facility])    Brain metastasis   . Constipation   . Depression   . Dyspnea   . GERD (gastroesophageal reflux disease)   . H/O chronic bronchitis   . Hypothyroidism   . Seasonal allergies   . SVD (spontaneous vaginal delivery)    x 2  . Thyroid disease    hypothyroid  . Wears partial dentures     ALLERGIES:  is allergic to albuterol.  MEDICATIONS:  Current Outpatient Medications  Medication Sig Dispense Refill  . acetaminophen (TYLENOL) 325 MG tablet Take 325 mg by mouth every 6 (six) hours as needed for mild pain or headache.    Marland Kitchen apixaban (ELIQUIS) 5 MG TABS tablet Take 1 tablet (5 mg total) by mouth 2 (two) times daily. 60 tablet 1  . azelastine (ASTELIN) 0.1 % nasal spray Place 1 spray into both nostrils 2 (two) times daily as needed for rhinitis. Use in each nostril as directed (Patient taking differently: Place 1 spray into both nostrils 2 (two) times daily as needed for rhinitis. ) 30 mL 12  . benzonatate (TESSALON) 100 MG capsule Take 1 capsule (100 mg total) by mouth 3 (three) times daily as needed for cough. 20 capsule 0  . cephALEXin (KEFLEX) 500 MG capsule Take 1 capsule (500 mg total) by mouth 3 (three) times daily. 15 capsule 0  . Cetirizine HCl (ZYRTEC  ALLERGY) 10 MG CAPS Take 1 capsule (10 mg total) by mouth daily as needed (allergies). (Patient taking differently: Take 1 capsule by mouth daily. ) 30 capsule 11  . famotidine (PEPCID) 20 MG tablet Take 1 tablet (20 mg total) by mouth daily. 30 tablet 0  . levalbuterol (XOPENEX HFA) 45 MCG/ACT inhaler Inhale 1-2 puffs into the lungs every 4 (four) hours as needed for wheezing or shortness of breath. 1 Inhaler 0  . levothyroxine (SYNTHROID) 112 MCG tablet Take 1 tablet (112 mcg total) by mouth daily before breakfast. 90 tablet 1  . magnesium citrate SOLN Take 0.5 Bottles by  mouth daily as needed for moderate constipation or severe constipation.    Marland Kitchen osimertinib mesylate (TAGRISSO) 80 MG tablet Take 1 tablet (80 mg total) by mouth daily. 30 tablet 0  . polyethylene glycol (MIRALAX / GLYCOLAX) packet Take 17 g by mouth daily.     . sucralfate (CARAFATE) 1 GM/10ML suspension Take 10 mLs (1 g total) by mouth 4 (four) times daily -  with meals and at bedtime. (Patient not taking: Reported on 02/13/2019) 420 mL 0   No current facility-administered medications for this visit.     SURGICAL HISTORY:  Past Surgical History:  Procedure Laterality Date  . COLONOSCOPY     x 2  . IR PERC PLEURAL DRAIN W/INDWELL CATH W/IMG GUIDE  01/06/2019  . IR REMOVAL OF PLURAL CATH W/CUFF  02/23/2019  . TONSILLECTOMY    . TUBAL LIGATION      REVIEW OF SYSTEMS:  A comprehensive review of systems was negative except for: Musculoskeletal: positive for back pain   PHYSICAL EXAMINATION: General appearance: alert, cooperative, fatigued and no distress Head: Normocephalic, without obvious abnormality, atraumatic Neck: no adenopathy, no JVD, supple, symmetrical, trachea midline and thyroid not enlarged, symmetric, no tenderness/mass/nodules Lymph nodes: Cervical, supraclavicular, and axillary nodes normal. Resp: clear to auscultation bilaterally Back: symmetric, no curvature. ROM normal. No CVA tenderness. Cardio: regular rate and rhythm, S1, S2 normal, no murmur, click, rub or gallop GI: soft, non-tender; bowel sounds normal; no masses,  no organomegaly Extremities: extremities normal, atraumatic, no cyanosis or edema  ECOG PERFORMANCE STATUS: 1 - Symptomatic but completely ambulatory  Blood pressure 111/65, pulse 98, temperature 99.4 F (37.4 C), temperature source Oral, resp. rate 18, height '5\' 7"'  (1.702 m), weight 157 lb 6.4 oz (71.4 kg), SpO2 95 %.  LABORATORY DATA: Lab Results  Component Value Date   WBC 5.3 03/17/2019   HGB 12.6 03/17/2019   HCT 38.8 03/17/2019   MCV 93.3  03/17/2019   PLT 182 03/17/2019      Chemistry      Component Value Date/Time   NA 139 03/17/2019 0959   K 3.8 03/17/2019 0959   CL 107 03/17/2019 0959   CO2 24 03/17/2019 0959   BUN 9 03/17/2019 0959   CREATININE 0.79 03/17/2019 0959   CREATININE 0.63 02/13/2019 1302      Component Value Date/Time   CALCIUM 9.4 03/17/2019 0959   ALKPHOS 83 03/17/2019 0959   AST 20 03/17/2019 0959   ALT 13 03/17/2019 0959   BILITOT 0.8 03/17/2019 0959       RADIOGRAPHIC STUDIES: Ir Removal Of Plural Cath W/cuff  Result Date: 02/23/2019 INDICATION: History of lung cancer. History of tunneled left-sided pleural catheter for recurrent effusion. Request removal of tunneled pleural catheter as it is no longer needed. Tunneled drainage catheter was placed by Dr. Anselm Pancoast on 01/06/2019 and functioned well throughout duration of usage. EXAM:  REMOVAL LEFT TUNNELED PLEURAL CATHETER. MEDICATIONS: 1% plain lidocaine, 10 mL; ANESTHESIA/SEDATION: Moderate Sedation Time: 0 minutes. The patient's level of consciousness and vital signs were monitored continuously by radiology nursing throughout the procedure under my direct supervision. FLUOROSCOPY TIME:  Fluoroscopy Time: None COMPLICATIONS: None immediate. PROCEDURE: Informed written consent was obtained from the patient after a thorough discussion of the procedural risks, benefits and alternatives. All questions were addressed. Maximal Sterile Barrier Technique was utilized including caps, mask, sterile gowns, sterile gloves, sterile drape, hand hygiene and skin antiseptic. A timeout was performed prior to the initiation of the procedure. The left chest was prepped and draped in a sterile fashion. Lidocaine was utilized for local anesthesia. Utilizing combination of sharp and blunt dissection, the cuff of the catheter was exposed and the catheter was removed in its entirety. Pressure was applied to achieve adequate hemostasis. Sterile Vaseline gauze dressing was then  applied. IMPRESSION: Successful left tunneled pleural catheter removal. Read by: Ascencion Dike PA-C Electronically Signed   By: Sandi Mariscal M.D.   On: 02/23/2019 10:08    ASSESSMENT AND PLAN: This is a very pleasant 58 years old white female with a stage IV non-small cell lung cancer, adenocarcinoma with positive EGFR mutation in exon 21 ( L861Q) mutation as well as  Exon 5 G719X mutation  The patient is status post stereotactic radiotherapy to metastatic brain lesion as well as palliative radiotherapy to the left lung mass and bone metastasis. She also had left Pleurx catheter placed recently. I had a lengthy discussion with the patient today about her current disease stage, prognosis and treatment options.  The patient has positive for EGFR mutation in exon 21 as well as exon 18.   The patient was started on treatment with Tagrisso 80 mg p.o. daily on January 13, 2019. She has been tolerating this treatment well with no concerning adverse effects. I recommended for her to continue her current treatment with Tagrisso with the same dose. She is scheduled to have repeat CT scan of the chest, abdomen and pelvis in few days. I will see the patient back for follow-up visit in 1 months with repeat blood work unless the scan showed any concerning findings for progression, she will be seen sooner. We will provide the patient with prescription for shower chair because of her back pain and inability to stand during the shower. She was advised to call immediately if she has any concerning symptoms in the interval. The patient voices understanding of current disease status and treatment options and is in agreement with the current care plan. All questions were answered. The patient knows to call the clinic with any problems, questions or concerns. We can certainly see the patient much sooner if necessary.  I spent 10 minutes counseling the patient face to face. The total time spent in the appointment was 15  minutes.  Disclaimer: This note was dictated with voice recognition software. Similar sounding words can inadvertently be transcribed and may not be corrected upon review.

## 2019-03-20 NOTE — Telephone Encounter (Signed)
Faxed shower chair and notes to [facility].

## 2019-03-22 ENCOUNTER — Telehealth: Payer: Self-pay | Admitting: Internal Medicine

## 2019-03-22 ENCOUNTER — Telehealth: Payer: Self-pay | Admitting: Medical Oncology

## 2019-03-22 NOTE — Telephone Encounter (Signed)
Scheduled appt per 6/08 los - unable to reach pt . Left message with appt date and time

## 2019-03-22 NOTE — Telephone Encounter (Signed)
showerchair denied by [place]. Pt notified.

## 2019-03-24 ENCOUNTER — Other Ambulatory Visit: Payer: Self-pay | Admitting: Family Medicine

## 2019-03-24 ENCOUNTER — Ambulatory Visit (HOSPITAL_COMMUNITY)
Admission: RE | Admit: 2019-03-24 | Discharge: 2019-03-24 | Disposition: A | Discharge status: Home / Self Care | Payer: Managed Care, Other (non HMO) | Source: Ambulatory Visit | Attending: Internal Medicine | Admitting: Internal Medicine

## 2019-03-24 ENCOUNTER — Other Ambulatory Visit: Payer: Self-pay

## 2019-03-24 DIAGNOSIS — C3490 Malignant neoplasm of unspecified part of unspecified bronchus or lung: Secondary | ICD-10-CM | POA: Insufficient documentation

## 2019-03-24 IMAGING — CT CT CHEST WITH CONTRAST
2 of 5 series · 11 of 36 positions shown, 13 images · IV contrast (OMNIPAQUE)
Comparison: [DATE] chest CT angiogram. [DATE] PET-CT.
[DATE] chest CT.

CLINICAL DATA: Stage IV left upper lobe non-small cell lung cancer
status post targeted chemotherapy, palliative radiotherapy to the
brain, lung mass and bone metastases and left PleurX catheter
placement. Restaging.

EXAM:
CT CHEST, ABDOMEN, AND PELVIS WITH CONTRAST
TECHNIQUE: Multidetector CT imaging of the chest, abdomen and pelvis was
performed following the standard protocol during bolus
administration of intravenous contrast.
CONTRAST:  75mL OMNIPAQUE IOHEXOL 300 MG/ML  SOLN

[Series 2: cap with · axial · 0.83mm/px · z∈[-622,-142]mm · 8 of 122 slices shown, 10 images]
[im 13/122  mediastinal]
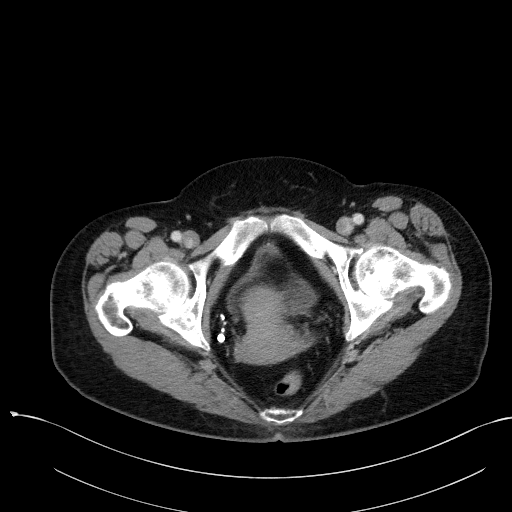
[im 13/122  lung]
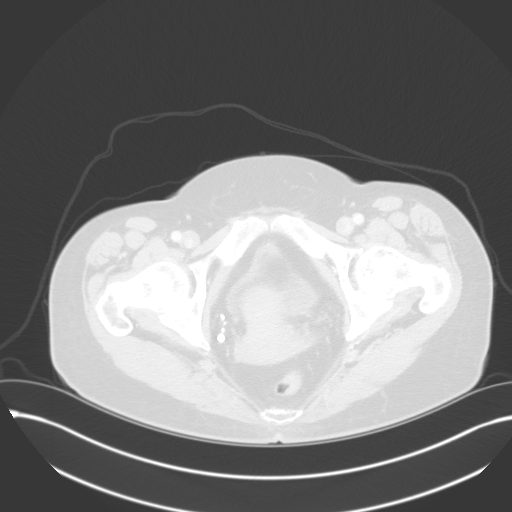
[im 25/122  lung]
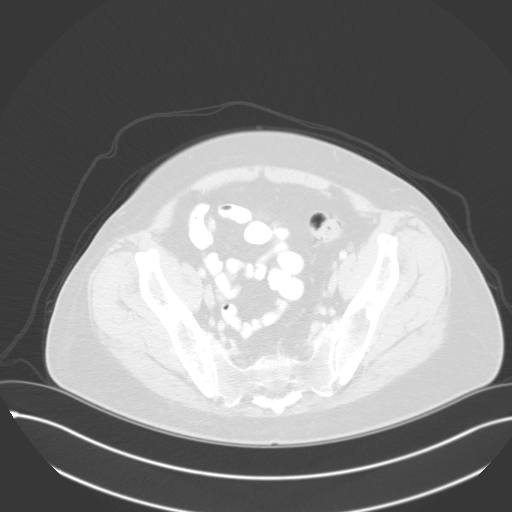
[im 37/122  lung]
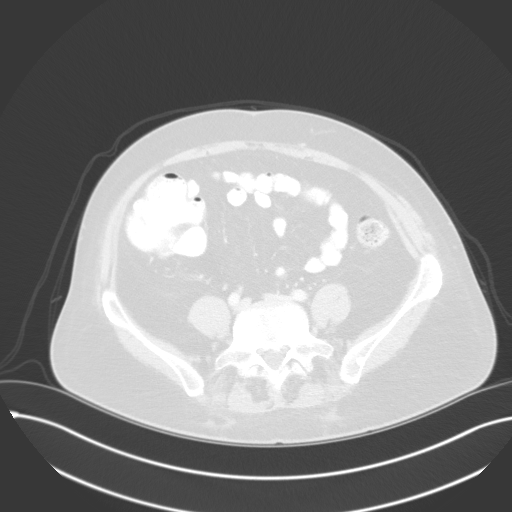
[im 49/122  lung]
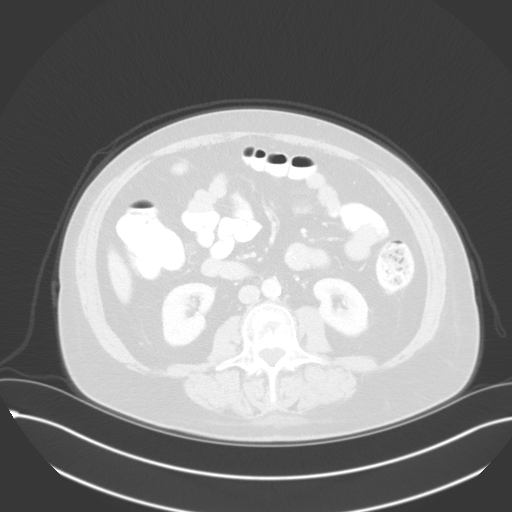
[im 73/122  mediastinal]
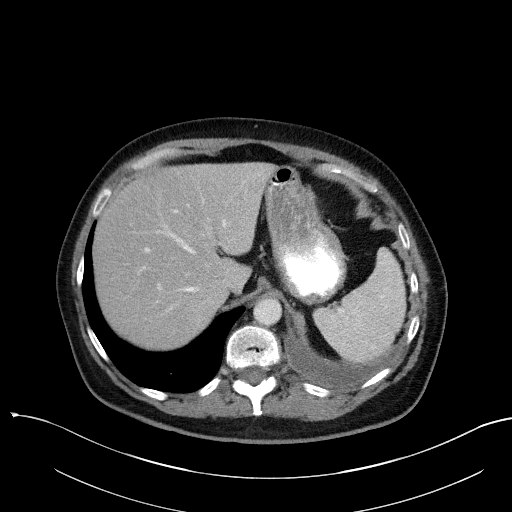
[im 73/122  lung]
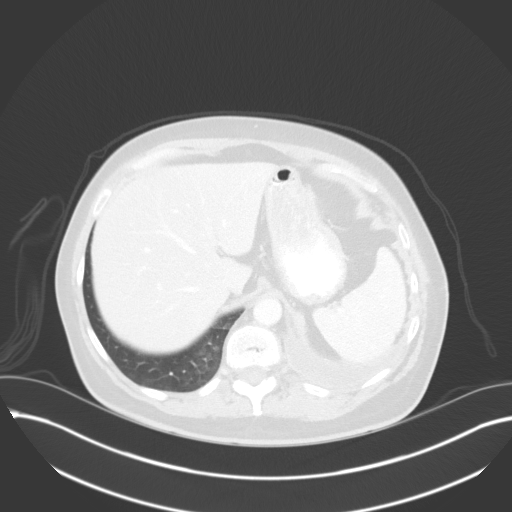
[im 85/122  lung]
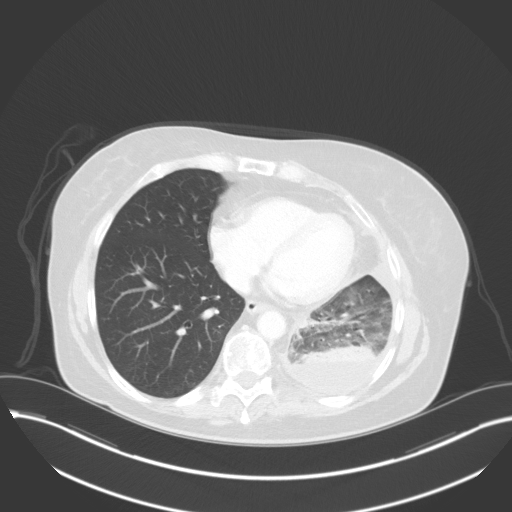
[im 97/122  lung]
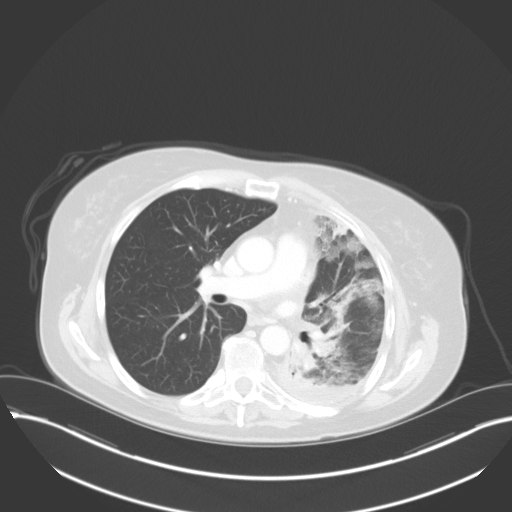
[im 109/122  lung]
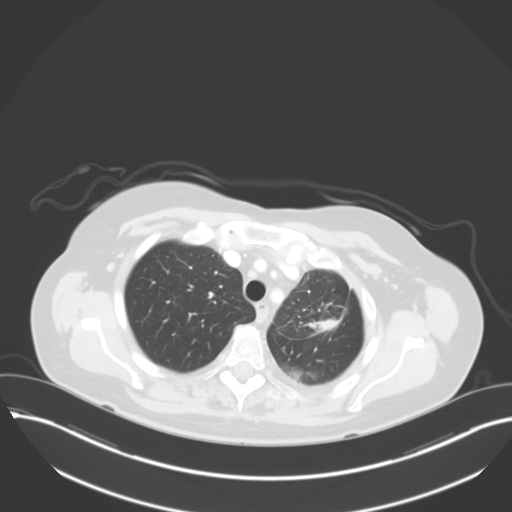

[Series 6: coronals · coronal · 0.63mm/px · 3 of 137 slices shown]
[im 28/137  lung]
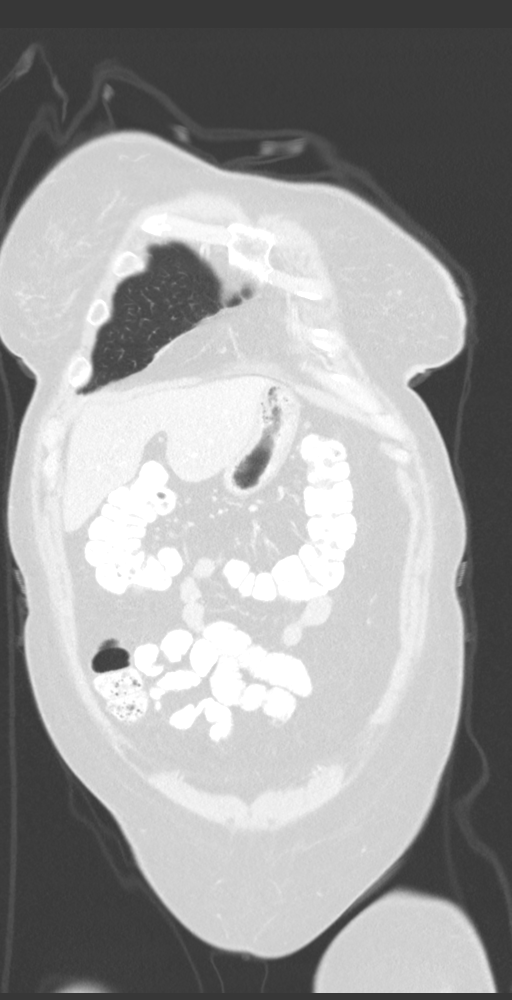
[im 55/137  lung]
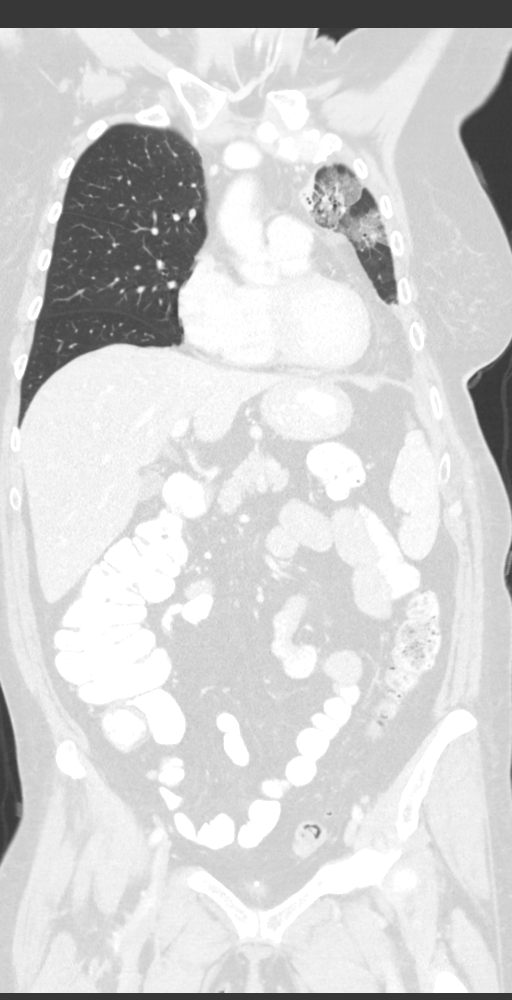
[im 82/137  lung]
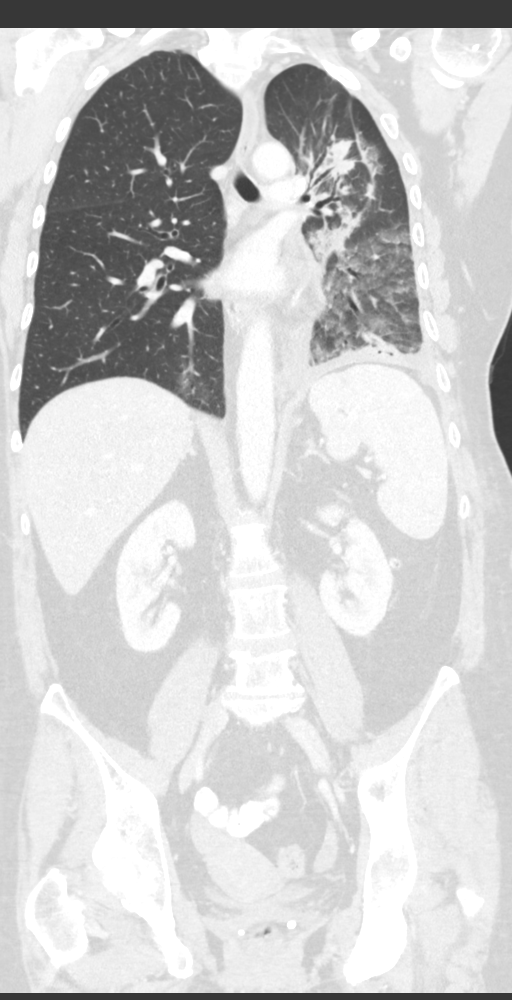

[11 of 36 positions shown; findings below may reference images not displayed]

FINDINGS: CT CHEST FINDINGS

Cardiovascular: Normal heart size. No significant pericardial
effusion/thickening. Atherosclerotic nonaneurysmal thoracic aorta.
Normal caliber pulmonary arteries. No central pulmonary emboli.

Mediastinum/Nodes: No discrete thyroid nodules. Unremarkable
esophagus. No axillary adenopathy. Multiple mildly enlarged left
supraclavicular nodes, largest 1.2 cm (series 2/image 5), previously
1.0 cm on [DATE] chest CT, minimally increased. No
pathologically enlarged mediastinal or hilar nodes. Stable calcified
left suprahilar 0.7 cm short axis node.

Lungs/Pleura: No pneumothorax. Interval removal of left PleurX
catheter. Small loculated dependent left pleural effusion with
mildly irregular left pleural thickening, not definitely changed. No
right pleural effusion. Improved aeration in the left upper lobe.
Irregular nodular 2.3 x 1.8 cm focus of consolidation in the left
upper lobe (series 5/image 42), previously obscured by atelectasis
and consolidation on [DATE] chest CT. Sharply marginated
extensive left perihilar consolidation with associated extensive
patchy ground-glass opacity throughout the left lung, largely new in
the interval. Mild compressive atelectasis in the dependent left
lower lobe. No acute consolidative airspace disease, lung masses or
significant pulmonary nodules in the right lung.

Musculoskeletal: There are widespread sclerotic osseous lesions
throughout thoracic spine, bilateral ribs and bilateral scapula,
which have significantly increased in sclerosis since [DATE],
for example in the anterior T8 vertebral body and in the upper left
scapula (series 6/image 100). No definite new osseous lesions in the
chest. Mild superior T12 vertebral compression fracture is new since
[DATE] chest CT. Moderate thoracic spondylosis.

CT ABDOMEN PELVIS FINDINGS

Hepatobiliary: Normal liver with no liver mass. Normal gallbladder
with no radiopaque cholelithiasis. No biliary ductal dilatation.

Pancreas: Low-attenuation 1.7 cm pancreatic neck lesion with coarse
internal calcification (series 2/image 60), not appreciably changed
since [DATE] CT. No new pancreatic lesions. No pancreatic duct
dilation.

Spleen: Normal size. No mass.

Adrenals/Urinary Tract: Normal adrenals. Normal kidneys with no
hydronephrosis and no renal mass. Normal collapsed bladder.

Stomach/Bowel: Small hiatal hernia. Otherwise normal nondistended
stomach. Normal caliber small bowel with no small bowel wall
thickening. Normal appendix. Oral contrast transits to the colon.
Mild left colonic diverticulosis, with no large bowel wall
thickening or significant pericolonic fat stranding.

Vascular/Lymphatic: Atherosclerotic nonaneurysmal abdominal aorta.
Patent portal, splenic, hepatic and renal veins. No pathologically
enlarged lymph nodes in the abdomen or pelvis.

Reproductive: Grossly normal uterus.  No adnexal mass.

Other: No pneumoperitoneum, ascites or focal fluid collection.

Musculoskeletal: There are widespread sclerotic osseous lesions
throughout the lumbar spine, sacrum, bilateral iliac bones and
proximal femora bilaterally, which are significantly increased in
sclerosis compared to the CT images from [DATE] PET-CT study,
correlating with the areas of skeletal hypermetabolism on that
PET-CT study. No definite new focal osseous lesions. Mild superior
L4 vertebral compression fracture is probably stable and chronic
compared to the coronal reformats on [DATE] PET-CT. Moderate
lumbar spondylosis.
IMPRESSION: 1. Widespread sclerotic osseous metastases throughout axial and
proximal appendicular skeleton, significantly increased in sclerosis
in the interval, a nonspecific change that probably reflects
treatment effect. No definite new osseous lesions.
2. Mild left supraclavicular lymphadenopathy is slightly increased
since [DATE] chest CT angiogram study.
3. Aeration in the left upper lobe is improved in the interval.
Irregular nodular focus of consolidation in the left upper lobe was
previously obscured by consolidation/atelectasis, limiting
comparison. New extensive sharply marginated perihilar consolidation
and ground-glass opacity in the left lung. Findings are nonspecific
but favored to represent evolving postradiation change. Close chest
CT follow-up recommended.
4. Stable loculated small left pleural effusion with left pleural
thickening.
5. Low-attenuation pancreatic neck lesion with internal coarse
calcification is stable since [DATE] CT, probably nonaggressive
cystic pancreatic lesion.
6.  Aortic Atherosclerosis ([4F]-[4F]).

## 2019-03-24 MED ORDER — SODIUM CHLORIDE (PF) 0.9 % IJ SOLN
INTRAMUSCULAR | Status: AC
  Filled 2019-03-24: qty 50

## 2019-03-24 MED ORDER — IOHEXOL 300 MG/ML  SOLN
75.0000 mL | Freq: Once | INTRAMUSCULAR | Status: AC | PRN
  Administered 2019-03-24: 75 mL via INTRAVENOUS

## 2019-03-24 NOTE — Progress Notes (Signed)
  Radiation Oncology         [phone]) [phone] ________________________________  Name: Tina Cruz MRN: [id number]  Date: 12/30/2018  DOB: 1960-10-26  SRS End of Treatment Note  Diagnosis:   58 y.o. female with Stage IV (T2b, N3, M1 C) non-small cell lung cancer, adenocarcinomawith lymphadenopathy and metastases to bone, right adrenal gland, and brain   Indication for treatment:  palliative       Radiation treatment dates:   12/30/2018  Site/dose:    1. PTV1 Rt Post Frontal 13mm // 20 Gy in 1 fraction, max dose=126.8% 2. PTV2 Lt Frontal 11mm // 20 Gy in 1 fraction, Max dose=123.7%  Beams/energy:    1. ExacTrac SBRT/SRT-VMAT, 4 vmat beams // 6FFF Photon 2. ExacTrac SBRT/SRT-3D, 4 DCA beams // 6FFF Photon  Narrative: The patient tolerated radiation treatment well.   There were no signs of acute toxicity after treatment.  Plan: The patient has completed radiation treatment. The patient will return to radiation oncology clinic for routine followup with her other radiation oncologist, Dr. Sondra Come, in one month. I advised the patient to call or return sooner if they have any questions or concerns related to their recovery or treatment. Anticipate restaging brain with MRI in second half of June. ________________________________  Eppie Gibson, MD  This document serves as a record of services personally performed by Eppie Gibson, MD. It was created on her behalf by Rae Lips, a trained medical scribe. The creation of this record is based on the scribe's personal observations and the provider's statements to them. This document has been checked and approved by the attending provider.

## 2019-03-28 ENCOUNTER — Other Ambulatory Visit: Payer: Self-pay | Admitting: Radiation Therapy

## 2019-03-28 ENCOUNTER — Telehealth: Payer: Self-pay | Admitting: *Deleted

## 2019-03-28 NOTE — Telephone Encounter (Signed)
Called patient to ask about rescheduling fu for 04-05-19 due to Dr. Isidore Moos being on vacation, rescheduled for 04-18-19 @ 2:40 pm, patient agreed to new date and time

## 2019-03-29 ENCOUNTER — Other Ambulatory Visit: Payer: Self-pay | Admitting: Internal Medicine

## 2019-03-29 DIAGNOSIS — C3492 Malignant neoplasm of unspecified part of left bronchus or lung: Secondary | ICD-10-CM

## 2019-04-03 ENCOUNTER — Ambulatory Visit
Admission: RE | Admit: 2019-04-03 | Discharge: 2019-04-03 | Disposition: A | Discharge status: Home / Self Care | Payer: Managed Care, Other (non HMO) | Source: Ambulatory Visit | Attending: Radiation Oncology | Admitting: Radiation Oncology

## 2019-04-03 DIAGNOSIS — C7931 Secondary malignant neoplasm of brain: Secondary | ICD-10-CM

## 2019-04-03 DIAGNOSIS — C7949 Secondary malignant neoplasm of other parts of nervous system: Secondary | ICD-10-CM

## 2019-04-03 IMAGING — MR MRI HEAD WITHOUT AND WITH CONTRAST
11 series · 48 of 48 positions shown · IV contrast (15ml Multihance)
Comparison: [DATE]

CLINICAL DATA: Restaging of non-small cell lung cancer. Two brain
metastases treated with SRS on [DATE].

EXAM:
MRI HEAD WITHOUT AND WITH CONTRAST
TECHNIQUE: Multiplanar, multiecho pulse sequences of the brain and surrounding
structures were obtained without and with intravenous contrast.
CONTRAST:  15mL MULTIHANCE GADOBENATE DIMEGLUMINE 529 MG/ML IV SOLN

[Series 2: FLAIR · sagittal · 3.0mm · 0.72mm/px · 2 of 37 slices shown (1 of 2)]
[im 1/37]
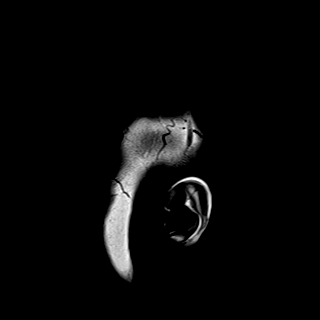
[im 37/37]
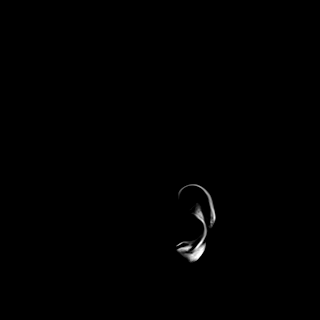

[Series 3: DWI · axial · 3.0mm · 1.50mm/px · z∈[-62,+86]mm · 5 of 78 slices shown (1 of 2)]
[im 1/78]
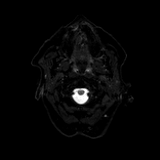
[im 20/78]
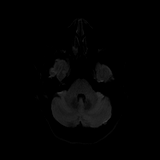
[im 39/78]
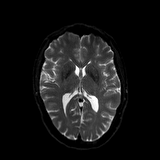
[im 58/78]
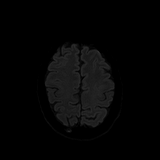
[im 78/78]
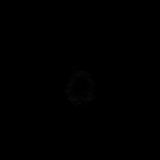

[Series 4: DWI · axial · 3.0mm · 1.50mm/px · z∈[-62,+86]mm · 2 of 38 slices shown (2 of 2)]
[im 1/38]
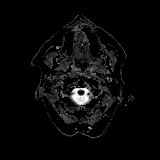
[im 38/38]
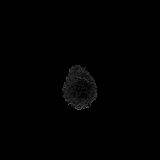

[Series 5: T2 · axial · 5.0mm · 0.69mm/px · z∈[-66,+90]mm · 2 of 27 slices shown]
[im 1/27]
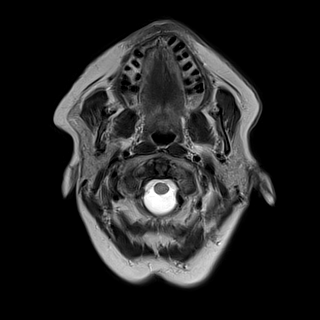
[im 27/27]
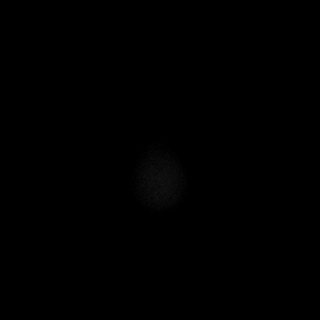

[Series 7: swi_images · axial · 1.5mm · 0.90mm/px · z∈[-65,+90]mm · 6 of 104 slices shown]
[im 1/104]
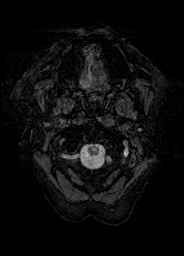
[im 21/104]
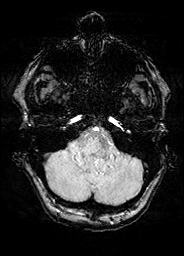
[im 42/104]
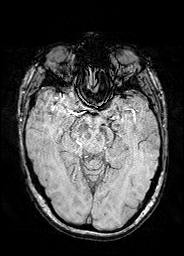
[im 62/104]
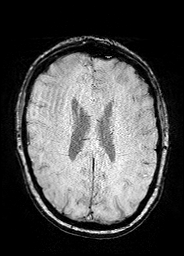
[im 83/104]
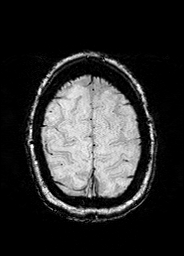
[im 104/104]
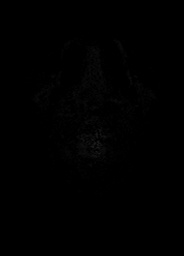

[Series 8: FLAIR · axial · 3.0mm · 0.60mm/px · z∈[-62,+91]mm · 3 of 52 slices shown (2 of 2)]
[im 1/52]
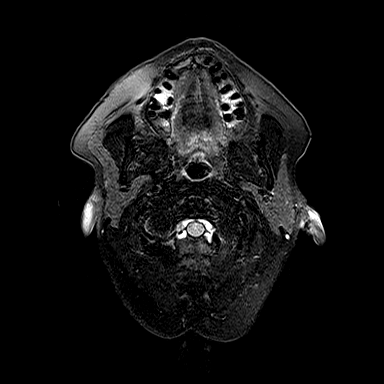
[im 26/52]
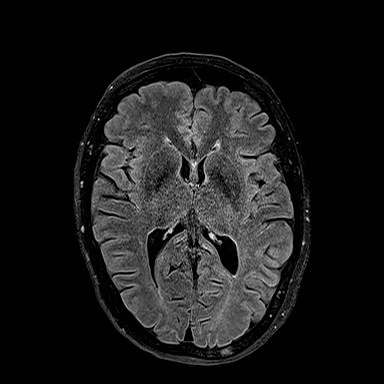
[im 52/52]
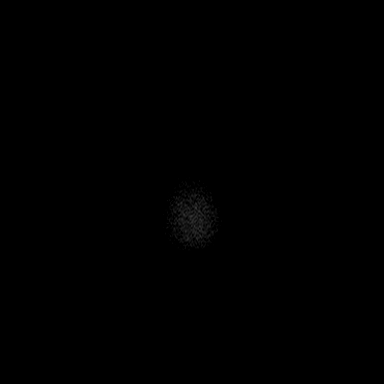

[Series 9: T1 · axial · 1.0mm · 0.75mm/px · z∈[-65,+93]mm · 10 of 159 slices shown]
[im 1/159]
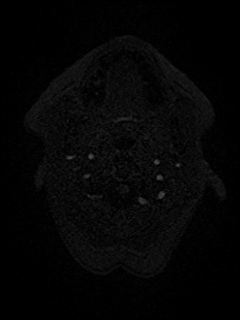
[im 18/159]
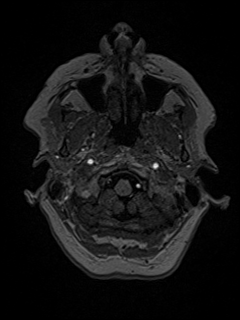
[im 36/159]
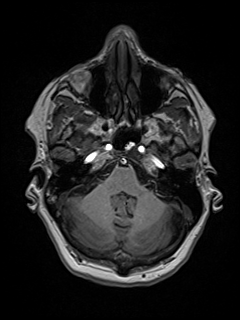
[im 53/159]
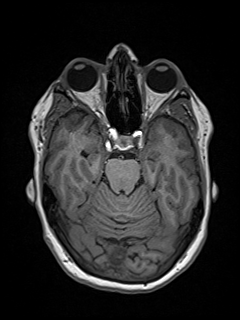
[im 71/159]
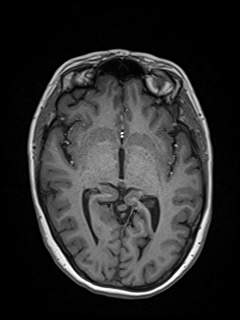
[im 88/159]
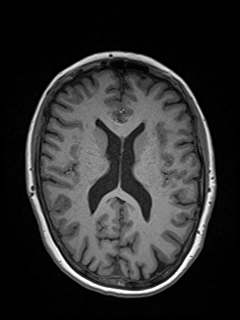
[im 106/159]
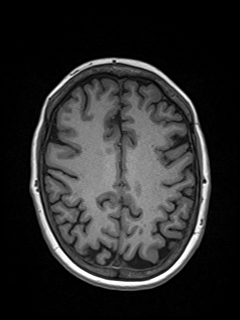
[im 123/159]
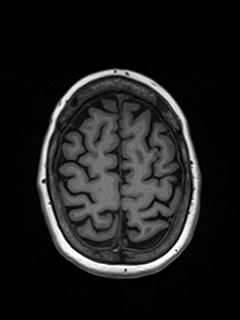
[im 141/159]
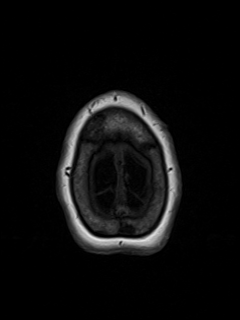
[im 159/159]
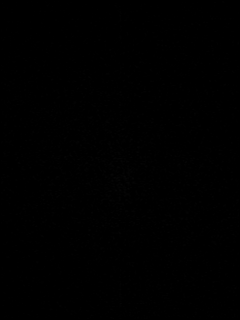

[Series 10: T2 post-contrast · coronal · 3.0mm · 0.57mm/px · 3 of 45 slices shown]
[im 1/45]
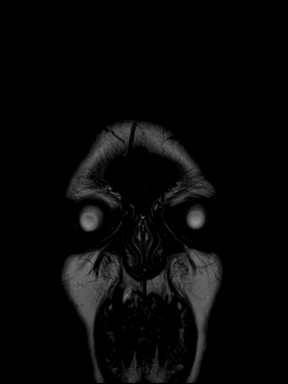
[im 23/45]
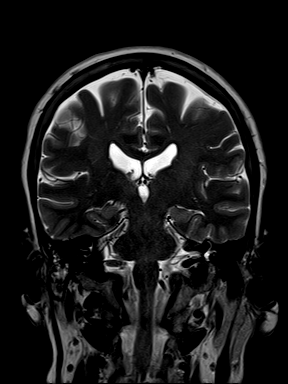
[im 45/45]
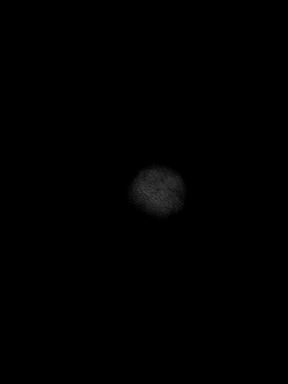

[Series 11: T1 post-contrast · axial · 1.0mm · 0.75mm/px · z∈[-65,+94]mm · 10 of 160 slices shown (1 of 2)]
[im 1/160]
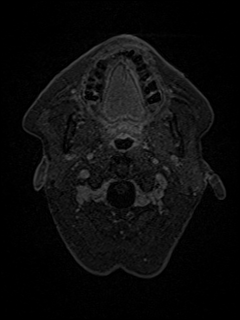
[im 18/160]
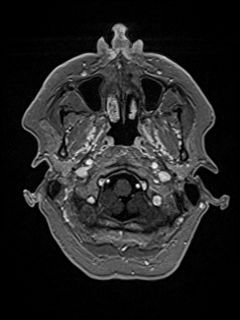
[im 36/160]
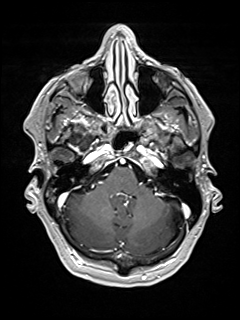
[im 54/160]
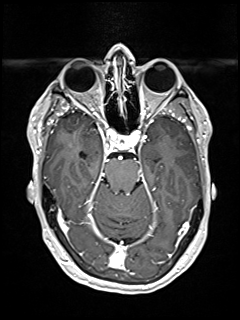
[im 71/160]
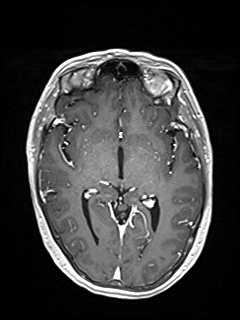
[im 89/160]
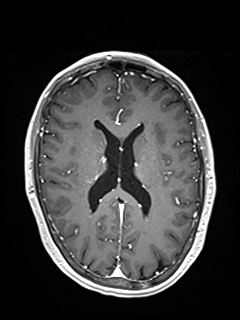
[im 107/160]
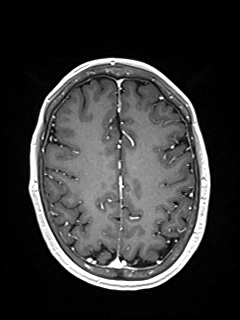
[im 124/160]
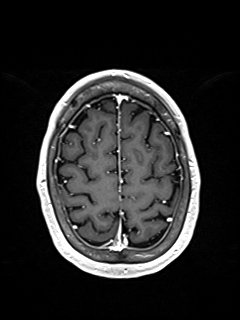
[im 142/160]
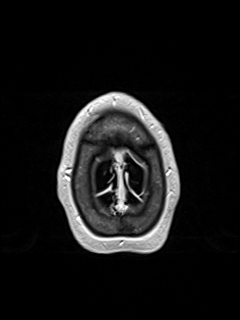
[im 160/160]
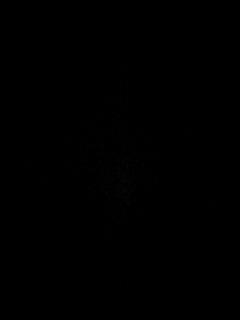

[Series 12: T1 post-contrast · coronal · 3.0mm · 0.69mm/px · 3 of 45 slices shown (2 of 2)]
[im 1/45]
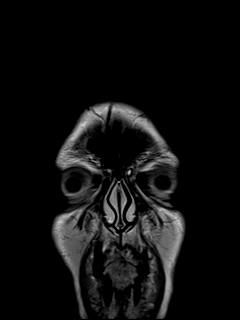
[im 23/45]
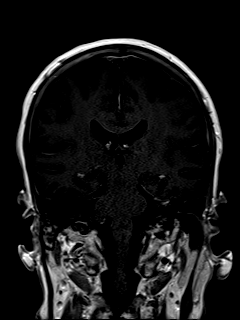
[im 45/45]
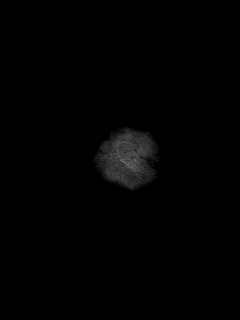

[Series 13: FLAIR post-contrast · sagittal · 3.0mm · 0.72mm/px · 2 of 37 slices shown]
[im 1/37]
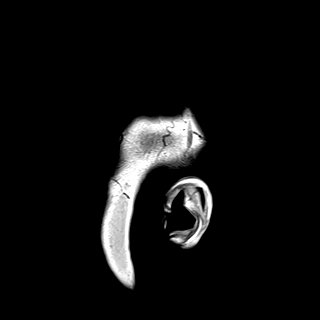
[im 37/37]
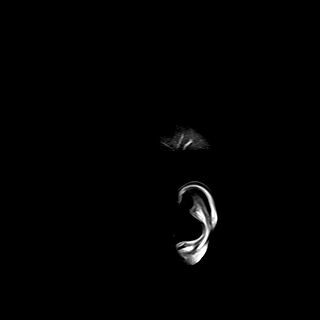

[48 of 48 positions shown; findings below may reference images not displayed]

FINDINGS: BRAIN

New Lesions: None.

Larger lesions: None.

Stable or Smaller lesions:

1. Decreased size of 5 mm right frontoparietal lesion (series 11,
image 111, previously 14 mm) with nearly completely resolved edema
and new chronic blood products.
2. At most faint residual enhancement corresponding to the suspected
2 mm left frontal metastasis on the prior study (series 11, image 87
and series 13, image 27)

Other Brain findings: There is no evidence of acute infarct, midline
shift, or extra-axial fluid collection. The ventricles and sulci are
within normal limits. No age advanced cerebral white matter disease
is evident.

Vascular: Major intracranial vascular flow voids are preserved.

Skull and upper cervical spine: Increased size of multiple skull
metastases without significant extraosseous extension.

Sinuses/Orbits: Unremarkable orbits. Clear paranasal sinuses.
Moderate right mastoid effusion.

Other: None.
IMPRESSION: 1. Decreased size of the 2 treated brain metastases.
2. No evidence of new brain metastases or acute intracranial
abnormality.
3. Interval enlargement of multiple skull metastases.

## 2019-04-03 MED ORDER — GADOBENATE DIMEGLUMINE 529 MG/ML IV SOLN
15.0000 mL | Freq: Once | INTRAVENOUS | Status: AC | PRN
  Administered 2019-04-03: 11:00:00 15 mL via INTRAVENOUS

## 2019-04-05 ENCOUNTER — Inpatient Hospital Stay: Payer: Managed Care, Other (non HMO)

## 2019-04-05 ENCOUNTER — Ambulatory Visit: Payer: Self-pay | Admitting: Radiation Oncology

## 2019-04-16 ENCOUNTER — Other Ambulatory Visit: Payer: Self-pay

## 2019-04-16 ENCOUNTER — Emergency Department (HOSPITAL_COMMUNITY): Payer: Managed Care, Other (non HMO)

## 2019-04-16 ENCOUNTER — Encounter (HOSPITAL_COMMUNITY): Payer: Self-pay | Admitting: *Deleted

## 2019-04-16 ENCOUNTER — Inpatient Hospital Stay (HOSPITAL_COMMUNITY)
Admission: EM | Admit: 2019-04-16 | Discharge: 2019-04-23 | DRG: 871 | Disposition: A | Discharge status: Home Health | Payer: Managed Care, Other (non HMO) | Attending: Internal Medicine | Admitting: Internal Medicine

## 2019-04-16 DIAGNOSIS — R1084 Generalized abdominal pain: Secondary | ICD-10-CM | POA: Diagnosis not present

## 2019-04-16 DIAGNOSIS — Z79890 Hormone replacement therapy (postmenopausal): Secondary | ICD-10-CM

## 2019-04-16 DIAGNOSIS — C7931 Secondary malignant neoplasm of brain: Secondary | ICD-10-CM | POA: Diagnosis present

## 2019-04-16 DIAGNOSIS — N280 Ischemia and infarction of kidney: Secondary | ICD-10-CM | POA: Diagnosis present

## 2019-04-16 DIAGNOSIS — Z20828 Contact with and (suspected) exposure to other viral communicable diseases: Secondary | ICD-10-CM | POA: Diagnosis present

## 2019-04-16 DIAGNOSIS — R5381 Other malaise: Secondary | ICD-10-CM | POA: Diagnosis present

## 2019-04-16 DIAGNOSIS — J9601 Acute respiratory failure with hypoxia: Secondary | ICD-10-CM | POA: Diagnosis present

## 2019-04-16 DIAGNOSIS — R269 Unspecified abnormalities of gait and mobility: Secondary | ICD-10-CM | POA: Diagnosis present

## 2019-04-16 DIAGNOSIS — J910 Malignant pleural effusion: Secondary | ICD-10-CM | POA: Diagnosis present

## 2019-04-16 DIAGNOSIS — F329 Major depressive disorder, single episode, unspecified: Secondary | ICD-10-CM | POA: Diagnosis present

## 2019-04-16 DIAGNOSIS — R05 Cough: Secondary | ICD-10-CM

## 2019-04-16 DIAGNOSIS — E860 Dehydration: Secondary | ICD-10-CM | POA: Diagnosis present

## 2019-04-16 DIAGNOSIS — K219 Gastro-esophageal reflux disease without esophagitis: Secondary | ICD-10-CM | POA: Diagnosis present

## 2019-04-16 DIAGNOSIS — M4854XA Collapsed vertebra, not elsewhere classified, thoracic region, initial encounter for fracture: Secondary | ICD-10-CM | POA: Diagnosis present

## 2019-04-16 DIAGNOSIS — M4856XA Collapsed vertebra, not elsewhere classified, lumbar region, initial encounter for fracture: Secondary | ICD-10-CM | POA: Diagnosis present

## 2019-04-16 DIAGNOSIS — S32000A Wedge compression fracture of unspecified lumbar vertebra, initial encounter for closed fracture: Secondary | ICD-10-CM | POA: Diagnosis present

## 2019-04-16 DIAGNOSIS — Z853 Personal history of malignant neoplasm of breast: Secondary | ICD-10-CM

## 2019-04-16 DIAGNOSIS — A419 Sepsis, unspecified organism: Principal | ICD-10-CM | POA: Diagnosis present

## 2019-04-16 DIAGNOSIS — Z86711 Personal history of pulmonary embolism: Secondary | ICD-10-CM

## 2019-04-16 DIAGNOSIS — Z888 Allergy status to other drugs, medicaments and biological substances status: Secondary | ICD-10-CM

## 2019-04-16 DIAGNOSIS — Z79899 Other long term (current) drug therapy: Secondary | ICD-10-CM

## 2019-04-16 DIAGNOSIS — Z87891 Personal history of nicotine dependence: Secondary | ICD-10-CM

## 2019-04-16 DIAGNOSIS — B3749 Other urogenital candidiasis: Secondary | ICD-10-CM | POA: Diagnosis present

## 2019-04-16 DIAGNOSIS — Z7902 Long term (current) use of antithrombotics/antiplatelets: Secondary | ICD-10-CM

## 2019-04-16 DIAGNOSIS — E039 Hypothyroidism, unspecified: Secondary | ICD-10-CM | POA: Diagnosis present

## 2019-04-16 DIAGNOSIS — I2699 Other pulmonary embolism without acute cor pulmonale: Secondary | ICD-10-CM

## 2019-04-16 DIAGNOSIS — R109 Unspecified abdominal pain: Secondary | ICD-10-CM | POA: Diagnosis present

## 2019-04-16 DIAGNOSIS — R112 Nausea with vomiting, unspecified: Secondary | ICD-10-CM | POA: Diagnosis present

## 2019-04-16 DIAGNOSIS — C3412 Malignant neoplasm of upper lobe, left bronchus or lung: Secondary | ICD-10-CM | POA: Diagnosis present

## 2019-04-16 DIAGNOSIS — R0902 Hypoxemia: Secondary | ICD-10-CM

## 2019-04-16 DIAGNOSIS — R9431 Abnormal electrocardiogram [ECG] [EKG]: Secondary | ICD-10-CM

## 2019-04-16 DIAGNOSIS — J90 Pleural effusion, not elsewhere classified: Secondary | ICD-10-CM | POA: Diagnosis present

## 2019-04-16 DIAGNOSIS — K5730 Diverticulosis of large intestine without perforation or abscess without bleeding: Secondary | ICD-10-CM | POA: Diagnosis present

## 2019-04-16 DIAGNOSIS — D735 Infarction of spleen: Secondary | ICD-10-CM | POA: Diagnosis present

## 2019-04-16 DIAGNOSIS — J302 Other seasonal allergic rhinitis: Secondary | ICD-10-CM | POA: Diagnosis present

## 2019-04-16 DIAGNOSIS — C7951 Secondary malignant neoplasm of bone: Secondary | ICD-10-CM | POA: Diagnosis present

## 2019-04-16 DIAGNOSIS — K5909 Other constipation: Secondary | ICD-10-CM | POA: Diagnosis present

## 2019-04-16 DIAGNOSIS — C3492 Malignant neoplasm of unspecified part of left bronchus or lung: Secondary | ICD-10-CM

## 2019-04-16 DIAGNOSIS — J189 Pneumonia, unspecified organism: Secondary | ICD-10-CM | POA: Diagnosis present

## 2019-04-16 DIAGNOSIS — Z809 Family history of malignant neoplasm, unspecified: Secondary | ICD-10-CM

## 2019-04-16 DIAGNOSIS — R059 Cough, unspecified: Secondary | ICD-10-CM

## 2019-04-16 LAB — CBC WITH DIFFERENTIAL/PLATELET
Abs Immature Granulocytes: 0.05 10*3/uL (ref 0.00–0.07)
Basophils Absolute: 0 10*3/uL (ref 0.0–0.1)
Basophils Relative: 0 %
Eosinophils Absolute: 0 10*3/uL (ref 0.0–0.5)
Eosinophils Relative: 0 %
HCT: 39 % (ref 36.0–46.0)
Hemoglobin: 13.1 g/dL (ref 12.0–15.0)
Immature Granulocytes: 1 %
Lymphocytes Relative: 6 %
Lymphs Abs: 0.6 10*3/uL — ABNORMAL LOW (ref 0.7–4.0)
MCH: 29.7 pg (ref 26.0–34.0)
MCHC: 33.6 g/dL (ref 30.0–36.0)
MCV: 88.4 fL (ref 80.0–100.0)
Monocytes Absolute: 0.6 10*3/uL (ref 0.1–1.0)
Monocytes Relative: 5 %
Neutro Abs: 9.5 10*3/uL — ABNORMAL HIGH (ref 1.7–7.7)
Neutrophils Relative %: 88 %
Platelets: 171 10*3/uL (ref 150–400)
RBC: 4.41 MIL/uL (ref 3.87–5.11)
RDW: 12 % (ref 11.5–15.5)
WBC: 10.8 10*3/uL — ABNORMAL HIGH (ref 4.0–10.5)
nRBC: 0 % (ref 0.0–0.2)

## 2019-04-16 LAB — URINALYSIS, ROUTINE W REFLEX MICROSCOPIC
Bacteria, UA: NONE SEEN
Bilirubin Urine: NEGATIVE
Glucose, UA: NEGATIVE mg/dL
Ketones, ur: 80 mg/dL — AB
Nitrite: NEGATIVE
Protein, ur: 30 mg/dL — AB
RBC / HPF: >50 RBC/hpf — ABNORMAL HIGH (ref 0–5)
Specific Gravity, Urine: 1.017 (ref 1.005–1.030)
pH: 7 (ref 5.0–8.0)

## 2019-04-16 LAB — COMPREHENSIVE METABOLIC PANEL
ALT: 16 U/L (ref 0–44)
AST: 41 U/L (ref 15–41)
Albumin: 3.7 g/dL (ref 3.5–5.0)
Alkaline Phosphatase: 77 U/L (ref 38–126)
Anion gap: 15 (ref 5–15)
BUN: 14 mg/dL (ref 6–20)
CO2: 22 mmol/L (ref 22–32)
Calcium: 9.5 mg/dL (ref 8.9–10.3)
Chloride: 105 mmol/L (ref 98–111)
Creatinine, Ser: 0.69 mg/dL (ref 0.44–1.00)
GFR calc Af Amer: >60 mL/min (ref >60)
GFR calc non Af Amer: >60 mL/min (ref >60)
Glucose, Bld: 128 mg/dL — ABNORMAL HIGH (ref 70–99)
Potassium: 3.8 mmol/L (ref 3.5–5.1)
Sodium: 142 mmol/L (ref 135–145)
Total Bilirubin: 0.8 mg/dL (ref 0.3–1.2)
Total Protein: 7.1 g/dL (ref 6.5–8.1)

## 2019-04-16 LAB — LIPASE, BLOOD: Lipase: 31 U/L (ref 11–51)

## 2019-04-16 LAB — LACTIC ACID, PLASMA: Lactic Acid, Venous: 1.4 mmol/L (ref 0.5–1.9)

## 2019-04-16 LAB — I-STAT BETA HCG BLOOD, ED (MC, WL, AP ONLY): I-stat hCG, quantitative: <5 m[IU]/mL (ref <5)

## 2019-04-16 IMAGING — CT CT ABDOMEN AND PELVIS WITH CONTRAST
2 of 5 series · 15 of 46 positions shown, 17 images · IV contrast (ISOVUE)
Comparison: Most recent imaging CT [DATE], PET CT [DATE]

CLINICAL DATA: Acute abdominal pain. Nausea and vomiting.
Neutropenia.

EXAM:
CT ABDOMEN AND PELVIS WITH CONTRAST
TECHNIQUE: Multidetector CT imaging of the abdomen and pelvis was performed
using the standard protocol following bolus administration of
intravenous contrast.
CONTRAST:  100mL OMNIPAQUE IOHEXOL 300 MG/ML  SOLN

[Series 2: axial st · axial · 0.85mm/px · z∈[-486,-91]mm · 12 of 91 slices shown, 14 images]
[im 6/91  soft-tissue]
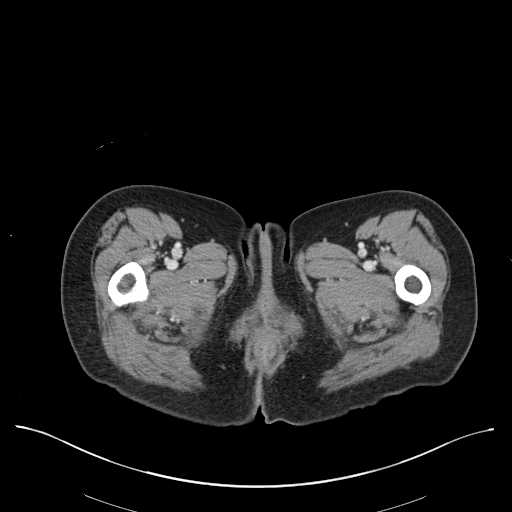
[im 6/91  bone]
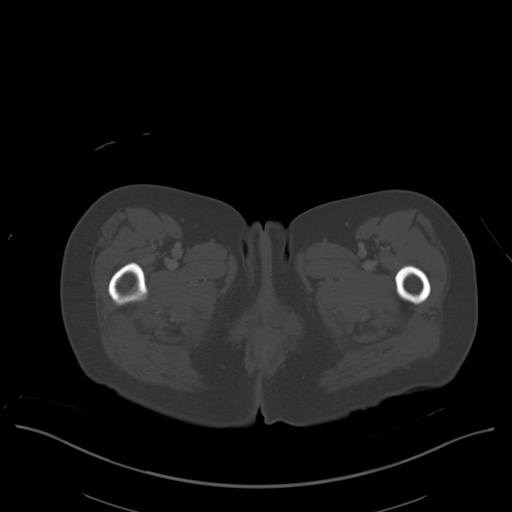
[im 16/91  soft-tissue]
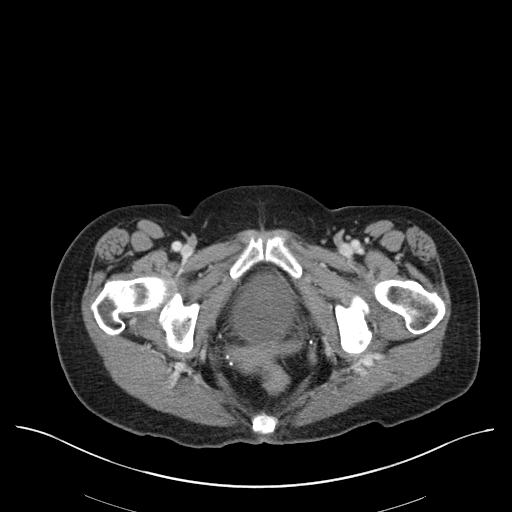
[im 22/91  soft-tissue]
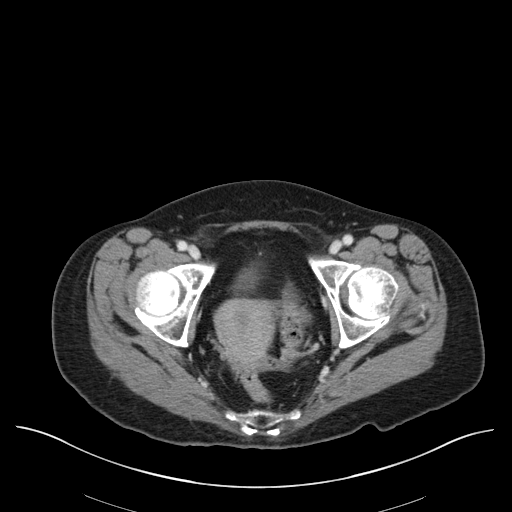
[im 27/91  soft-tissue]
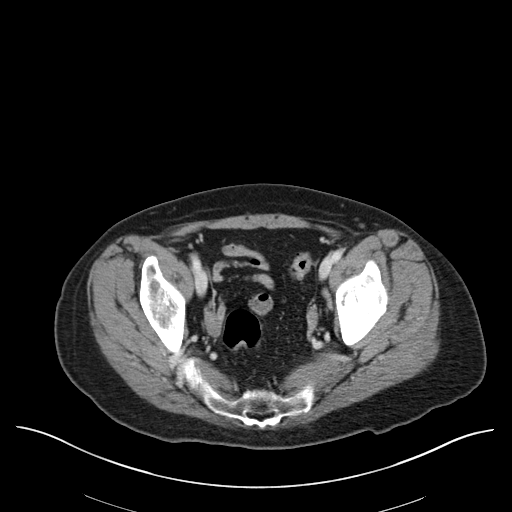
[im 38/91  soft-tissue]
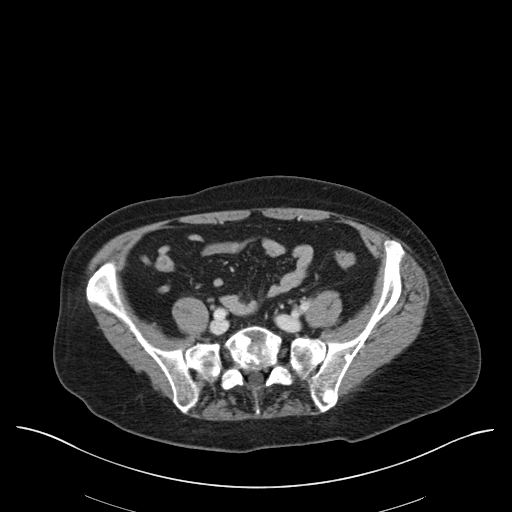
[im 43/91  soft-tissue]
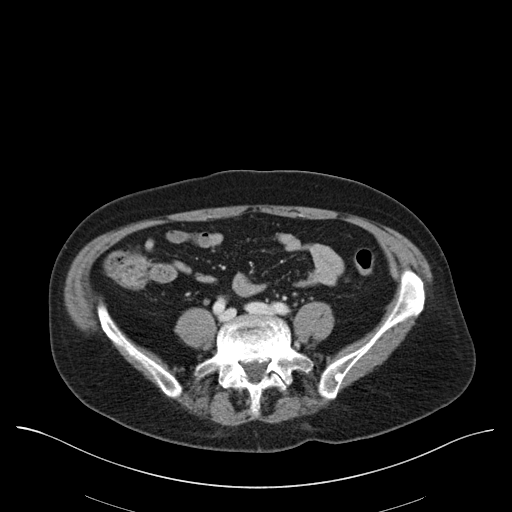
[im 48/91  soft-tissue]
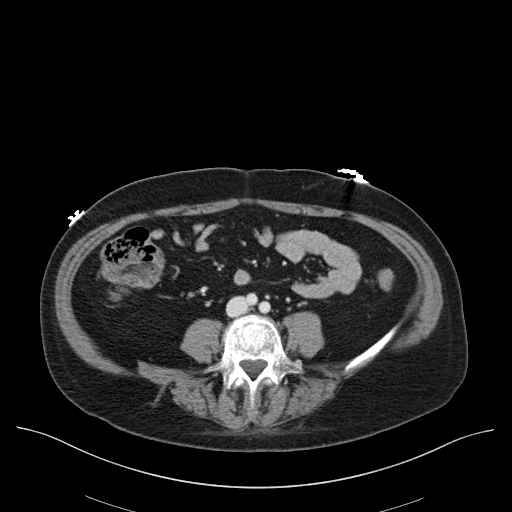
[im 59/91  soft-tissue]
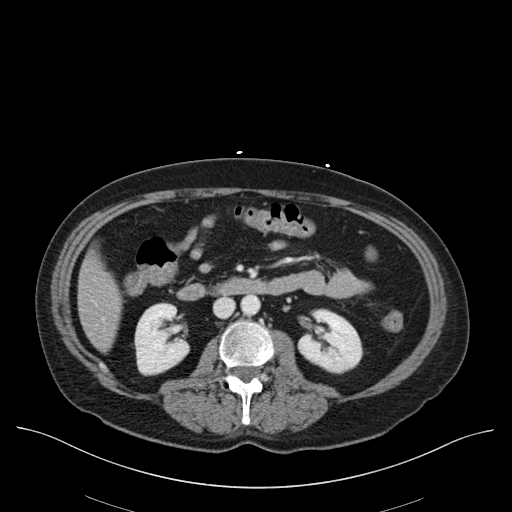
[im 64/91  soft-tissue]
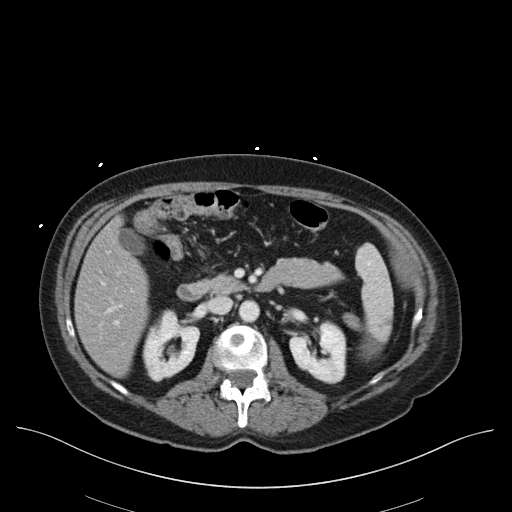
[im 64/91  bone]
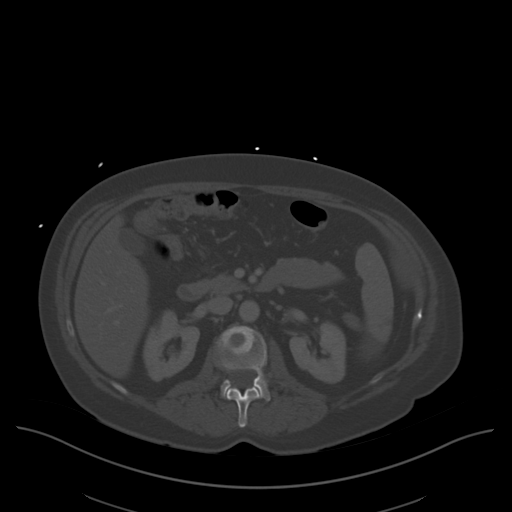
[im 69/91  soft-tissue]
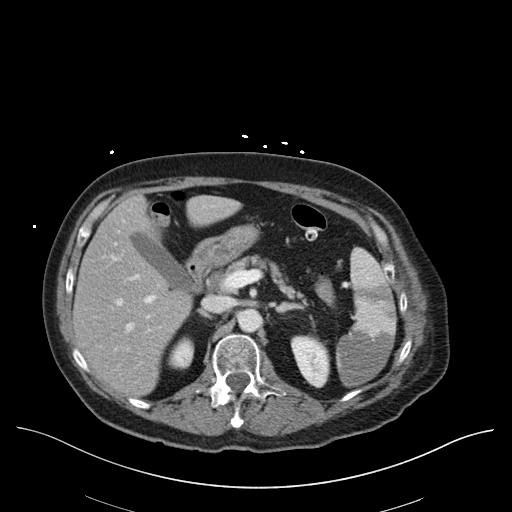
[im 80/91  soft-tissue]
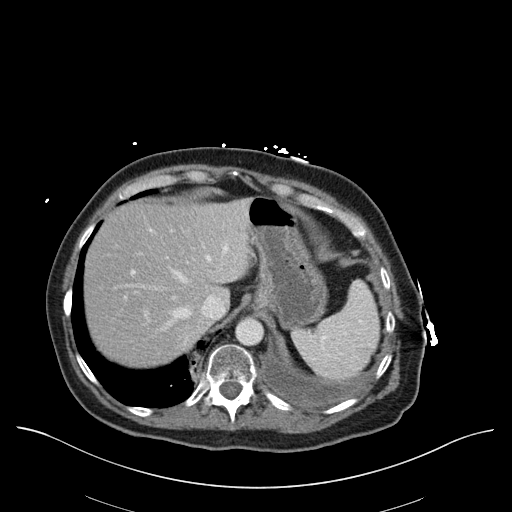
[im 85/91  soft-tissue]
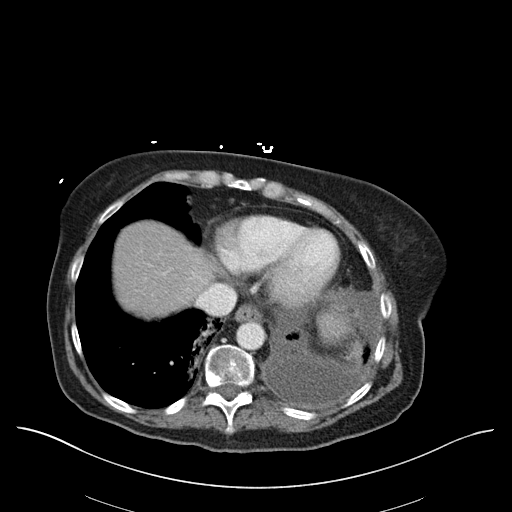

[Series 4: coronal st · coronal · 0.72mm/px · 3 of 132 slices shown]
[im 44/132  soft-tissue]
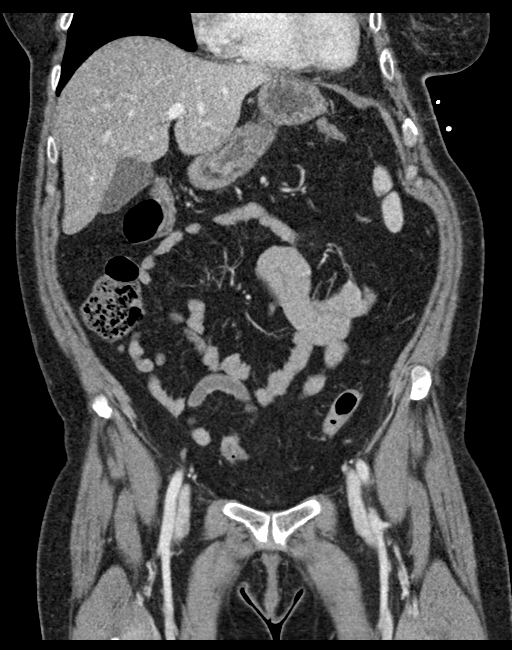
[im 59/132  soft-tissue]
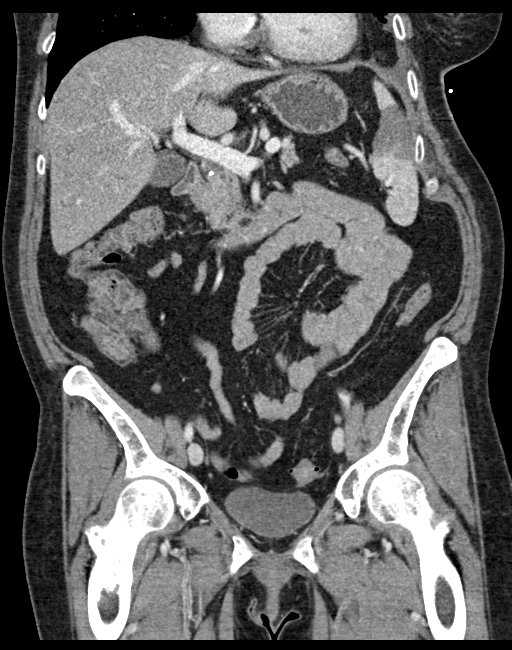
[im 73/132  soft-tissue]
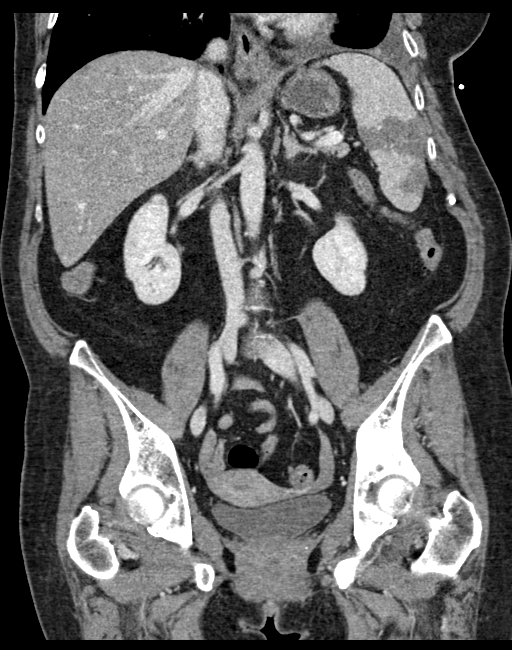

[15 of 46 positions shown; findings below may reference images not displayed]

FINDINGS: Lower chest: Left pleural effusion with enhancement, similar to
slightly decreased from prior exam. Unchanged compressive
atelectasis and ground-glass opacities throughout the left lung
base. New linear consolidation with air bronchograms in the medial
right lower lobe.

Hepatobiliary: Hepatic steatosis. No focal lesion. Gallbladder
physiologically distended, no calcified stone. No biliary
dilatation.

Pancreas: 17 mm low-density lesion in the pancreatic neck with
central NITHIN occasion, unchanged parenchymal atrophy. No ductal
dilatation or inflammation.

Spleen: Multiple wedge-shaped low-density defects primarily in the
mid lower spleen consistent with infarcts. No perisplenic fluid.

Adrenals/Urinary Tract: No adrenal nodule. No hydronephrosis or
perinephric edema. Small slightly wedge-shaped defect of the
superior right kidney, best appreciated on sagittal image 57 series
6, not present on prior exam. Symmetric excretion on delayed phase
imaging. Urinary bladder is physiologically distended without wall
thickening.

Stomach/Bowel: Small hiatal hernia. Stomach physiologically
distended. No bowel wall thickening, inflammatory change, or
obstruction. Mild colonic diverticulosis without diverticulitis.
Normal appendix.

Vascular/Lymphatic: Calcified aortic atherosclerosis. No aneurysm.
Patent portal vein and mesenteric vessels. No enlarged lymph nodes
in the abdomen or pelvis.

Reproductive: Uterus and bilateral adnexa are unremarkable.

Other: No ascites. No free air or intra-abdominal fluid collection.

Musculoskeletal: Known multifocal sclerotic metastatic disease which
appears similar to prior exam. L4 and T12 compression fractures are
unchanged from prior. Mild central depression of superior L[DATE]
represent interval mild pathologic compression fracture.
IMPRESSION: 1. Multiple splenic infarcts, new from prior exam.
2. Small wedge-shaped defect in the superior right kidney, new from
prior exam. Given splenic infarcts, small infarct is favored over
pyelonephritis.
3. Unchanged cystic lesion in the pancreatic neck with internal
calcification.
4. Mild central depression of superior L3 is new and may reflect
interval mild pathologic compression fracture. Unchanged multifocal
sclerotic osseous metastatic disease. Mild L4 and T12 compression
fractures are unchanged.
5. Linear consolidation in the medial right lower lobe, not seen
previously, this may represent postradiation change versus
pneumonia.
6. Chronic partially loculated left pleural effusion.
7. Colonic diverticulosis without diverticulitis. Aortic
Atherosclerosis ([ZP]-[ZP]).

## 2019-04-16 MED ORDER — SODIUM CHLORIDE (PF) 0.9 % IJ SOLN
INTRAMUSCULAR | Status: AC
  Filled 2019-04-16: qty 50

## 2019-04-16 MED ORDER — MORPHINE SULFATE (PF) 4 MG/ML IV SOLN
4.0000 mg | Freq: Once | INTRAVENOUS | Status: AC
  Administered 2019-04-17: 4 mg via INTRAVENOUS
  Filled 2019-04-16: qty 1

## 2019-04-16 MED ORDER — IOHEXOL 300 MG/ML  SOLN
100.0000 mL | Freq: Once | INTRAMUSCULAR | Status: AC | PRN
  Administered 2019-04-16: 100 mL via INTRAVENOUS

## 2019-04-16 MED ORDER — SODIUM CHLORIDE 0.9 % IV SOLN
2.0000 g | Freq: Once | INTRAVENOUS | Status: AC
  Administered 2019-04-16: 22:00:00 2 g via INTRAVENOUS
  Filled 2019-04-16: qty 2

## 2019-04-16 MED ORDER — METRONIDAZOLE IN NACL 5-0.79 MG/ML-% IV SOLN
500.0000 mg | Freq: Once | INTRAVENOUS | Status: AC
  Administered 2019-04-16: 500 mg via INTRAVENOUS
  Filled 2019-04-16: qty 100

## 2019-04-16 NOTE — ED Triage Notes (Signed)
Brought in [place] with ABD pain and N/V since this am.  Deines SOB other pain. No blood in vomit received 8 mg zofran with some relief, 12 Lead unremarkable 20 g l wrist, BP 120/60 HR 100 CBG RR 26 112 96% RA

## 2019-04-16 NOTE — ED Provider Notes (Addendum)
[facility] DEPT Provider Note   CSN: [id number] Arrival date & time: 04/16/19  2028    History   Chief Complaint No chief complaint on file.   HPI Tina Cruz is a 58 y.o. female with history of metastatic breast cancer on targeted therapy, GERD, hypothyroidism presenting today for abdominal pain, nausea and vomiting that began at 3 PM today.  Patient reports sudden onset periumbilical abdominal pain that has been constant since onset, and she can only describe it as pain and cannot find a clear descriptor of the pain she reports it as severe and without radiation.  Reports that she has felt warm however has not measured a fever at home.  Patient reports 3 episodes of nonbloody/nonbilious emesis, received 8 mg of Zofran prior to arrival.  Denies chest pain/shortness of breath, headache, diarrhea, dysuria/hematuria, blood in the stool, extremity pain, cough/hemoptysis or any additional concerns.    HPI  Past Medical History:  Diagnosis Date   Active smoker    quit 2016   Cancer [facility])    left lung stage IV non-cell carcinoma/adenocarcinoma with diagnosis made from left pleural effusion.     Cancer [facility])    Brain metastasis    Constipation    Depression    Dyspnea    GERD (gastroesophageal reflux disease)    H/O chronic bronchitis    Hypothyroidism    Seasonal allergies    SVD (spontaneous vaginal delivery)    x 2   Thyroid disease    hypothyroid   Wears partial dentures     Patient Active Problem List   Diagnosis Date Noted   Pneumonia 04/17/2019   Protein-calorie malnutrition ([place]) 02/13/2019   Hives    Itching    Acute respiratory failure with hypoxia ([place]) 01/27/2019   Encounter for antineoplastic chemotherapy 01/09/2019   Pulmonary embolism ([place]) 01/06/2019   Adenocarcinoma of left lung, stage 4 ([place]) 12/14/2018   Recurrent left pleural effusion 12/14/2018   Bone metastasis ([place]) 12/14/2018   Brain  metastasis ([place]) 12/14/2018   Goals of care, counseling/discussion 12/14/2018   Cancer associated pain 12/14/2018   Allergic rhinitis 07/27/2018   Hemangioma 03/11/2017   Seborrheic keratosis 03/11/2017   Skin tag 03/11/2017   GERD (gastroesophageal reflux disease) 01/14/2015   Thyroid disease     Past Surgical History:  Procedure Laterality Date   COLONOSCOPY     x 2   IR PERC PLEURAL DRAIN W/INDWELL CATH W/IMG GUIDE  01/06/2019   IR REMOVAL OF PLURAL CATH W/CUFF  02/23/2019   TONSILLECTOMY     TUBAL LIGATION       OB History   No obstetric history on file.      Home Medications    Prior to Admission medications   Medication Sig Start Date End Date Taking? Authorizing Provider  acetaminophen (TYLENOL) 325 MG tablet Take 325 mg by mouth every 6 (six) hours as needed for mild pain or headache.   Yes [provider]  azelastine (ASTELIN) 0.1 % nasal spray Place 1 spray into both nostrils 2 (two) times daily as needed for rhinitis. Use in each nostril as directed Patient taking differently: Place 1 spray into both nostrils 2 (two) times daily as needed for rhinitis.  11/02/18  Yes Delsa Grana, PA-C  Cetirizine HCl (ZYRTEC ALLERGY) 10 MG CAPS Take 1 capsule (10 mg total) by mouth daily as needed (allergies). Patient taking differently: Take 1 capsule by mouth daily.  07/27/18  Yes Orlena Sheldon, PA-C  ELIQUIS 5 MG TABS tablet TAKE 1 TABLET BY MOUTH TWICE A DAY Patient taking differently: Take 5 mg by mouth 2 (two) times daily.  03/24/19  Yes [place], Modena Nunnery, MD  famotidine (PEPCID) 20 MG tablet Take 1 tablet (20 mg total) by mouth daily. 01/30/19  Yes Eugenie Filler, MD  levalbuterol [facility] HFA) 45 MCG/ACT inhaler Inhale 1-2 puffs into the lungs every 4 (four) hours as needed for wheezing or shortness of breath. 02/13/19  Yes [place], Modena Nunnery, MD  levothyroxine (SYNTHROID) 112 MCG tablet Take 1 tablet (112 mcg total) by mouth daily before breakfast.  01/30/19  Yes [place], Modena Nunnery, MD  magnesium citrate SOLN Take 0.5 Bottles by mouth daily as needed for moderate constipation or severe constipation.   Yes [provider]  montelukast (SINGULAIR) 10 MG tablet Take 10 mg by mouth at bedtime. 02/16/19  Yes [provider]  polyethylene glycol (MIRALAX / GLYCOLAX) packet Take 17 g by mouth daily.    Yes [provider]  TAGRISSO 80 MG tablet TAKE 1 TABLET DAILY Patient taking differently: Take 80 mg by mouth daily.  03/29/19  Yes [name], MD  benzonatate (TESSALON) 100 MG capsule Take 1 capsule (100 mg total) by mouth 3 (three) times daily as needed for cough. Patient not taking: Reported on 04/16/2019 02/13/19   [name], MD  cephALEXin (KEFLEX) 500 MG capsule Take 1 capsule (500 mg total) by mouth 3 (three) times daily. Patient not taking: Reported on 04/16/2019 02/13/19   [name], MD  sucralfate (CARAFATE) 1 GM/10ML suspension Take 10 mLs (1 g total) by mouth 4 (four) times daily -  with meals and at bedtime. Patient not taking: Reported on 02/13/2019 01/10/19   Gery Pray, MD    Family History Family History  Problem Relation Age of Onset   Cancer Mother        colon   Alcohol abuse Father     Social History Social History   Tobacco Use   Smoking status: Former Smoker    Packs/day: 0.50    Years: 5.00    Pack years: 2.50    Types: Cigarettes    Quit date: 06/03/2015    Years since quitting: 3.8   Smokeless tobacco: Never Used  Substance Use Topics   Alcohol use: No    Alcohol/week: 0.0 standard drinks   Drug use: No     Allergies   Albuterol   Review of Systems Review of Systems  Constitutional: Positive for appetite change and fever (Subjective). Negative for chills.  Respiratory: Negative.  Negative for cough and shortness of breath.   Cardiovascular: Negative.  Negative for chest pain.  Gastrointestinal: Positive for abdominal pain, nausea and vomiting. Negative  for blood in stool and diarrhea.  Genitourinary: Negative.  Negative for dysuria, hematuria, vaginal bleeding and vaginal discharge.  All other systems reviewed and are negative.  Physical Exam Updated Vital Signs BP 138/67    Pulse (!) 108    Temp 97.7 F (36.5 C) (Oral)    Resp (!) 26    Ht 5\' 7"  (1.702 m)    Wt 68 kg    LMP  (LMP Unknown)    SpO2 95%    BMI 23.49 kg/m   Physical Exam Constitutional:      General: She is not in acute distress.    Appearance: Normal appearance. She is well-developed. She is not ill-appearing or diaphoretic.  HENT:     Head: Normocephalic and  atraumatic.     Right Ear: External ear normal.     Left Ear: External ear normal.     Nose: Nose normal.  Eyes:     General: Vision grossly intact. Gaze aligned appropriately.     Pupils: Pupils are equal, round, and reactive to light.  Neck:     Musculoskeletal: Normal range of motion.     Trachea: Trachea and phonation normal. No tracheal deviation.  Cardiovascular:     Rate and Rhythm: Normal rate and regular rhythm.     Pulses: Normal pulses.     Heart sounds: Normal heart sounds.  Pulmonary:     Effort: Pulmonary effort is normal. No respiratory distress.     Breath sounds: Normal breath sounds.  Abdominal:     General: There is no distension.     Palpations: Abdomen is soft.     Tenderness: There is abdominal tenderness in the periumbilical area. There is no guarding or rebound.  Musculoskeletal: Normal range of motion.  Skin:    General: Skin is warm and dry.  Neurological:     Mental Status: She is alert.     GCS: GCS eye subscore is 4. GCS verbal subscore is 5. GCS motor subscore is 6.     Comments: Speech is clear and goal oriented, follows commands Major Cranial nerves without deficit, no facial droop Moves extremities without ataxia, coordination intact  Psychiatric:        Behavior: Behavior normal.    ED Treatments / Results  Labs (all labs ordered are listed, but only abnormal  results are displayed) Labs Reviewed  CBC WITH DIFFERENTIAL/PLATELET - Abnormal; Notable for the following components:      Result Value   WBC 10.8 (*)    Neutro Abs 9.5 (*)    Lymphs Abs 0.6 (*)    All other components within normal limits  COMPREHENSIVE METABOLIC PANEL - Abnormal; Notable for the following components:   Glucose, Bld 128 (*)    All other components within normal limits  URINALYSIS, ROUTINE W REFLEX MICROSCOPIC - Abnormal; Notable for the following components:   APPearance HAZY (*)    Hgb urine dipstick MODERATE (*)    Ketones, ur 80 (*)    Protein, ur 30 (*)    Leukocytes,Ua TRACE (*)    RBC / HPF >50 (*)    All other components within normal limits  CULTURE, BLOOD (ROUTINE X 2)  CULTURE, BLOOD (ROUTINE X 2)  SARS CORONAVIRUS 2 (HOSPITAL ORDER, [facility])  URINE CULTURE  LIPASE, BLOOD  LACTIC ACID, PLASMA  LACTIC ACID, PLASMA  I-STAT BETA HCG BLOOD, ED ([facility], [facility], [facility] ONLY)    EKG EKG Interpretation  Date/Time:  Sunday April 16 2019 23:54:54 EDT Ventricular Rate:  109 PR Interval:    QRS Duration: 84 QT Interval:  369 QTC Calculation: 497 R Axis:   69 Text Interpretation:  Sinus tachycardia Borderline prolonged QT interval When compared with ECG of EARLIER SAME DATE QT has lengthened Confirmed by Delora Fuel ([id number]) on 04/17/2019 12:08:52 AM   Radiology Ct Abdomen Pelvis W Contrast  Result Date: 04/16/2019 CLINICAL DATA:  Acute abdominal pain. Nausea and vomiting. Neutropenia. EXAM: CT ABDOMEN AND PELVIS WITH CONTRAST TECHNIQUE: Multidetector CT imaging of the abdomen and pelvis was performed using the standard protocol following bolus administration of intravenous contrast. CONTRAST:  111mL OMNIPAQUE IOHEXOL 300 MG/ML  SOLN COMPARISON:  Most recent imaging CT 03/24/2019, PET CT 12/12/2018 FINDINGS: Lower chest: Left  pleural effusion with enhancement, similar to slightly decreased from prior exam. Unchanged compressive atelectasis  and ground-glass opacities throughout the left lung base. New linear consolidation with air bronchograms in the medial right lower lobe. Hepatobiliary: Hepatic steatosis. No focal lesion. Gallbladder physiologically distended, no calcified stone. No biliary dilatation. Pancreas: 17 mm low-density lesion in the pancreatic neck with central quest occasion, unchanged parenchymal atrophy. No ductal dilatation or inflammation. Spleen: Multiple wedge-shaped low-density defects primarily in the mid lower spleen consistent with infarcts. No perisplenic fluid. Adrenals/Urinary Tract: No adrenal nodule. No hydronephrosis or perinephric edema. Small slightly wedge-shaped defect of the superior right kidney, best appreciated on sagittal image 57 series 6, not present on prior exam. Symmetric excretion on delayed phase imaging. Urinary bladder is physiologically distended without wall thickening. Stomach/Bowel: Small hiatal hernia. Stomach physiologically distended. No bowel wall thickening, inflammatory change, or obstruction. Mild colonic diverticulosis without diverticulitis. Normal appendix. Vascular/Lymphatic: Calcified aortic atherosclerosis. No aneurysm. Patent portal vein and mesenteric vessels. No enlarged lymph nodes in the abdomen or pelvis. Reproductive: Uterus and bilateral adnexa are unremarkable. Other: No ascites. No free air or intra-abdominal fluid collection. Musculoskeletal: Known multifocal sclerotic metastatic disease which appears similar to prior exam. L4 and T12 compression fractures are unchanged from prior. Mild central depression of superior L3 may represent interval mild pathologic compression fracture. IMPRESSION: 1. Multiple splenic infarcts, new from prior exam. 2. Small wedge-shaped defect in the superior right kidney, new from prior exam. Given splenic infarcts, small infarct is favored over pyelonephritis. 3. Unchanged cystic lesion in the pancreatic neck with internal calcification. 4. Mild  central depression of superior L3 is new and may reflect interval mild pathologic compression fracture. Unchanged multifocal sclerotic osseous metastatic disease. Mild L4 and T12 compression fractures are unchanged. 5. Linear consolidation in the medial right lower lobe, not seen previously, this may represent postradiation change versus pneumonia. 6. Chronic partially loculated left pleural effusion. 7. Colonic diverticulosis without diverticulitis. Aortic Atherosclerosis (ICD10-I70.0). Electronically Signed   By: Keith Rake M.D.   On: 04/16/2019 23:52   Dg Chest Portable 1 View  Result Date: 04/17/2019 CLINICAL DATA:  Shortness of breath. History of lung cancer. Questionable pna on CT EXAM: PORTABLE CHEST 1 VIEW COMPARISON:  Chest CT 03/24/2019. Lung bases from abdominal CT earlier this day. FINDINGS: Increased volume loss in left hemithorax with left perihilar opacity from prior chest CT. Left pleural effusion. Grossly unchanged heart size and mediastinal contours, left heart border obscured. No pulmonary edema. The consolidation in the medial right lower lobe on CT is not seen radiographically. Right lung is otherwise clear. IMPRESSION: 1. Increased volume loss in the left hemithorax and perihilar density since prior CT. Findings may be due to post treatment related or postobstructive change, patient with known left lung cancer. Left pleural effusion. 2. The right lung base consolidation on abdominal CT earlier today is not visualized radiographically. Electronically Signed   By: Keith Rake M.D.   On: 04/17/2019 01:36    Procedures .Critical Care Performed by: Deliah Boston, PA-C Authorized by: Deliah Boston, PA-C   Critical care provider statement:    Critical care time (minutes):  32   Critical care was necessary to treat or prevent imminent or life-threatening deterioration of the following conditions:  Sepsis   Critical care was time spent personally by me on the following  activities:  Discussions with consultants, evaluation of patient's response to treatment, examination of patient, ordering and performing treatments and interventions, ordering and review of laboratory  studies, ordering and review of radiographic studies, pulse oximetry, re-evaluation of patient's condition, obtaining history from patient or surrogate, review of old charts and development of treatment plan with patient or surrogate   (including critical care time)  Medications Ordered in ED Medications  sodium chloride (PF) 0.9 % injection (has no administration in time range)  ceFEPIme (MAXIPIME) 2 g in sodium chloride 0.9 % 100 mL IVPB (0 g Intravenous Stopped 04/16/19 2255)  metroNIDAZOLE (FLAGYL) IVPB 500 mg (0 mg Intravenous Stopped 04/17/19 0027)  iohexol (OMNIPAQUE) 300 MG/ML solution 100 mL (100 mLs Intravenous Contrast Given 04/16/19 2317)  morphine 4 MG/ML injection 4 mg (4 mg Intravenous Given 04/17/19 0032)     Initial Impression / Assessment and Plan / ED Course  I have reviewed the triage vital signs and the nursing notes.  Pertinent labs & imaging results that were available during my care of the patient were reviewed by me and considered in my medical decision making (see chart for details).    Patient arrives tachycardic and tachypneic to ER with subjective fevers at home, ill-appearing with generalized abdominal pain, she is a cancer patient on targeted therapy.  Code sepsis initiated, cefepime/Flagyl ordered for intra-abdominal coverage.  CBC with leukocytosis of 10.8 with left shift CMP nonacute Lipase within normal limits Lactic 1.4 Beta-hCG negative Urinalysis with moderate hemoglobin, 80 ketones, protein, greater than 50 red blood cells, trace leukocytes and 0-5 white blood cells, does not appear infectious, patient without dysuria or hematuria will send for culture  CT Abd/pelvis:  IMPRESSION:  1. Multiple splenic infarcts, new from prior exam.  2. Small wedge-shaped  defect in the superior right kidney, new from  prior exam. Given splenic infarcts, small infarct is favored over  pyelonephritis.  3. Unchanged cystic lesion in the pancreatic neck with internal  calcification.  4. Mild central depression of superior L3 is new and may reflect  interval mild pathologic compression fracture. Unchanged multifocal  sclerotic osseous metastatic disease. Mild L4 and T12 compression  fractures are unchanged.  5. Linear consolidation in the medial right lower lobe, not seen  previously, this may represent postradiation change versus  pneumonia.  6. Chronic partially loculated left pleural effusion.  7. Colonic diverticulosis without diverticulitis. Aortic  Atherosclerosis (ICD10-I70.0).   COVID-19 test pending Chest x-ray ordered for evaluation of possible pneumonia versus postradiation changes, she is without respiratory symptoms.  Discussed case with hospitalist service who will see patient for admission.  Patient reassessed resting comfortably endorses improvement of pain following morphine.  Discussed results with, she states understanding of need for admission and is agreeable to plan of care.  Patient has been admitted to hospitalist service for further evaluation and management.  Note: Portions of this report may have been transcribed using voice recognition software. Every effort was made to ensure accuracy; however, inadvertent computerized transcription errors may still be present. Final Clinical Impressions(s) / ED Diagnoses   Final diagnoses:  Generalized abdominal pain  Sepsis without acute organ dysfunction, due to unspecified organism [facility])  QT prolongation    ED Discharge Orders    None       Gari Crown 04/17/19 0141    Dorie Rank, MD 04/17/19 [address], [place] 04/25/19 2505    Dorie Rank, MD 04/26/19 [phone]

## 2019-04-16 NOTE — ED Notes (Signed)
Pt stuck twice in right wrist and hand with no success.

## 2019-04-16 NOTE — ED Notes (Signed)
Bed: IL57 Expected date:  Expected time:  Means of arrival:  Comments: 58 yo F/ abd pain cancer pt.

## 2019-04-16 NOTE — Progress Notes (Signed)
A consult was received from an ED physician for Cefepime per pharmacy dosing.  The patient's profile has been reviewed for ht/wt/allergies/indication/available labs.   A one time order has been placed for Cefepime 2gm iv x1.  Further antibiotics/pharmacy consults should be ordered by admitting physician if indicated.                       Thank you, Nani Skillern Crowford 04/16/2019  9:32 PM

## 2019-04-17 ENCOUNTER — Emergency Department (HOSPITAL_COMMUNITY): Payer: Managed Care, Other (non HMO)

## 2019-04-17 ENCOUNTER — Inpatient Hospital Stay (HOSPITAL_COMMUNITY): Payer: Managed Care, Other (non HMO)

## 2019-04-17 DIAGNOSIS — Z87891 Personal history of nicotine dependence: Secondary | ICD-10-CM | POA: Diagnosis not present

## 2019-04-17 DIAGNOSIS — N280 Ischemia and infarction of kidney: Secondary | ICD-10-CM | POA: Diagnosis present

## 2019-04-17 DIAGNOSIS — K5909 Other constipation: Secondary | ICD-10-CM | POA: Diagnosis present

## 2019-04-17 DIAGNOSIS — R269 Unspecified abnormalities of gait and mobility: Secondary | ICD-10-CM | POA: Diagnosis present

## 2019-04-17 DIAGNOSIS — D735 Infarction of spleen: Secondary | ICD-10-CM | POA: Diagnosis present

## 2019-04-17 DIAGNOSIS — I2699 Other pulmonary embolism without acute cor pulmonale: Secondary | ICD-10-CM | POA: Diagnosis not present

## 2019-04-17 DIAGNOSIS — Z809 Family history of malignant neoplasm, unspecified: Secondary | ICD-10-CM | POA: Diagnosis not present

## 2019-04-17 DIAGNOSIS — J9601 Acute respiratory failure with hypoxia: Secondary | ICD-10-CM | POA: Diagnosis present

## 2019-04-17 DIAGNOSIS — C7931 Secondary malignant neoplasm of brain: Secondary | ICD-10-CM

## 2019-04-17 DIAGNOSIS — C3492 Malignant neoplasm of unspecified part of left bronchus or lung: Secondary | ICD-10-CM | POA: Diagnosis not present

## 2019-04-17 DIAGNOSIS — J910 Malignant pleural effusion: Secondary | ICD-10-CM | POA: Diagnosis present

## 2019-04-17 DIAGNOSIS — M4856XA Collapsed vertebra, not elsewhere classified, lumbar region, initial encounter for fracture: Secondary | ICD-10-CM | POA: Diagnosis present

## 2019-04-17 DIAGNOSIS — R112 Nausea with vomiting, unspecified: Secondary | ICD-10-CM | POA: Diagnosis not present

## 2019-04-17 DIAGNOSIS — R1084 Generalized abdominal pain: Secondary | ICD-10-CM | POA: Diagnosis present

## 2019-04-17 DIAGNOSIS — R109 Unspecified abdominal pain: Secondary | ICD-10-CM

## 2019-04-17 DIAGNOSIS — J181 Lobar pneumonia, unspecified organism: Secondary | ICD-10-CM | POA: Diagnosis not present

## 2019-04-17 DIAGNOSIS — C3412 Malignant neoplasm of upper lobe, left bronchus or lung: Secondary | ICD-10-CM | POA: Diagnosis present

## 2019-04-17 DIAGNOSIS — J302 Other seasonal allergic rhinitis: Secondary | ICD-10-CM | POA: Diagnosis present

## 2019-04-17 DIAGNOSIS — Z20828 Contact with and (suspected) exposure to other viral communicable diseases: Secondary | ICD-10-CM | POA: Diagnosis present

## 2019-04-17 DIAGNOSIS — E860 Dehydration: Secondary | ICD-10-CM | POA: Diagnosis present

## 2019-04-17 DIAGNOSIS — A419 Sepsis, unspecified organism: Secondary | ICD-10-CM | POA: Diagnosis present

## 2019-04-17 DIAGNOSIS — R0609 Other forms of dyspnea: Secondary | ICD-10-CM

## 2019-04-17 DIAGNOSIS — R5381 Other malaise: Secondary | ICD-10-CM | POA: Diagnosis present

## 2019-04-17 DIAGNOSIS — S32030A Wedge compression fracture of third lumbar vertebra, initial encounter for closed fracture: Secondary | ICD-10-CM

## 2019-04-17 DIAGNOSIS — J189 Pneumonia, unspecified organism: Secondary | ICD-10-CM | POA: Diagnosis present

## 2019-04-17 DIAGNOSIS — K219 Gastro-esophageal reflux disease without esophagitis: Secondary | ICD-10-CM

## 2019-04-17 DIAGNOSIS — K5730 Diverticulosis of large intestine without perforation or abscess without bleeding: Secondary | ICD-10-CM | POA: Diagnosis present

## 2019-04-17 DIAGNOSIS — B3749 Other urogenital candidiasis: Secondary | ICD-10-CM | POA: Diagnosis present

## 2019-04-17 DIAGNOSIS — J90 Pleural effusion, not elsewhere classified: Secondary | ICD-10-CM | POA: Diagnosis not present

## 2019-04-17 DIAGNOSIS — C7951 Secondary malignant neoplasm of bone: Secondary | ICD-10-CM | POA: Diagnosis present

## 2019-04-17 DIAGNOSIS — E039 Hypothyroidism, unspecified: Secondary | ICD-10-CM | POA: Diagnosis present

## 2019-04-17 DIAGNOSIS — M4854XA Collapsed vertebra, not elsewhere classified, thoracic region, initial encounter for fracture: Secondary | ICD-10-CM | POA: Diagnosis present

## 2019-04-17 DIAGNOSIS — F329 Major depressive disorder, single episode, unspecified: Secondary | ICD-10-CM | POA: Diagnosis present

## 2019-04-17 DIAGNOSIS — S32000A Wedge compression fracture of unspecified lumbar vertebra, initial encounter for closed fracture: Secondary | ICD-10-CM | POA: Diagnosis present

## 2019-04-17 LAB — GLUCOSE, CAPILLARY: Glucose-Capillary: 119 mg/dL — ABNORMAL HIGH (ref 70–99)

## 2019-04-17 LAB — PROTEIN, PLEURAL OR PERITONEAL FLUID: Total protein, fluid: 3.9 g/dL

## 2019-04-17 LAB — PROTIME-INR
INR: 1.4 — ABNORMAL HIGH (ref 0.8–1.2)
Prothrombin Time: 17 seconds — ABNORMAL HIGH (ref 11.4–15.2)

## 2019-04-17 LAB — CBC
HCT: 37.7 % (ref 36.0–46.0)
Hemoglobin: 12.6 g/dL (ref 12.0–15.0)
MCH: 29.8 pg (ref 26.0–34.0)
MCHC: 33.4 g/dL (ref 30.0–36.0)
MCV: 89.1 fL (ref 80.0–100.0)
Platelets: 155 10*3/uL (ref 150–400)
RBC: 4.23 MIL/uL (ref 3.87–5.11)
RDW: 12.1 % (ref 11.5–15.5)
WBC: 10.6 10*3/uL — ABNORMAL HIGH (ref 4.0–10.5)
nRBC: 0 % (ref 0.0–0.2)

## 2019-04-17 LAB — BASIC METABOLIC PANEL
Anion gap: 11 (ref 5–15)
BUN: 14 mg/dL (ref 6–20)
CO2: 24 mmol/L (ref 22–32)
Calcium: 9.2 mg/dL (ref 8.9–10.3)
Chloride: 105 mmol/L (ref 98–111)
Creatinine, Ser: 0.64 mg/dL (ref 0.44–1.00)
GFR calc Af Amer: >60 mL/min (ref >60)
GFR calc non Af Amer: >60 mL/min (ref >60)
Glucose, Bld: 134 mg/dL — ABNORMAL HIGH (ref 70–99)
Potassium: 3.8 mmol/L (ref 3.5–5.1)
Sodium: 140 mmol/L (ref 135–145)

## 2019-04-17 LAB — BODY FLUID CELL COUNT WITH DIFFERENTIAL
Lymphs, Fluid: 65 %
Monocyte-Macrophage-Serous Fluid: 28 % — ABNORMAL LOW (ref 50–90)
Neutrophil Count, Fluid: 7 % (ref 0–25)
Total Nucleated Cell Count, Fluid: 331 cu mm (ref 0–1000)

## 2019-04-17 LAB — GLUCOSE, PLEURAL OR PERITONEAL FLUID: Glucose, Fluid: 96 mg/dL

## 2019-04-17 LAB — ECHOCARDIOGRAM COMPLETE
Height: 67 in
Weight: 2371.2 oz

## 2019-04-17 LAB — MRSA PCR SCREENING: MRSA by PCR: NEGATIVE

## 2019-04-17 LAB — LACTIC ACID, PLASMA: Lactic Acid, Venous: 1.3 mmol/L (ref 0.5–1.9)

## 2019-04-17 LAB — MAGNESIUM: Magnesium: 2.2 mg/dL (ref 1.7–2.4)

## 2019-04-17 LAB — LACTATE DEHYDROGENASE, PLEURAL OR PERITONEAL FLUID: LD, Fluid: 236 U/L — ABNORMAL HIGH (ref 3–23)

## 2019-04-17 LAB — STREP PNEUMONIAE URINARY ANTIGEN: Strep Pneumo Urinary Antigen: NEGATIVE

## 2019-04-17 LAB — SARS CORONAVIRUS 2 BY RT PCR (HOSPITAL ORDER, PERFORMED IN ~~LOC~~ HOSPITAL LAB): SARS Coronavirus 2: NEGATIVE

## 2019-04-17 LAB — TSH: TSH: 1.054 u[IU]/mL (ref 0.350–4.500)

## 2019-04-17 IMAGING — CT CT CHEST WITHOUT CONTRAST
2 of 4 series · 15 of 36 positions shown, 18 images · non-contrast
Comparison: CT of the chest on [DATE]

CLINICAL DATA: Pt s/p thoracentesis. "medical history significant
of metastatic non-small cell lung cancer, with metastasis to bone,
brain, right adrenal gland and malignant left pleural effusion
presents with persistent nausea and vomiting since 3 a.m. today.
Periumbilical pain without radiation. Chronic cough and shortness of
breath, similar to baseline.

EXAM:
CT CHEST WITHOUT CONTRAST
TECHNIQUE: Multidetector CT imaging of the chest was performed following the
standard protocol without IV contrast.

[Series 2: thorax · axial · 0.76mm/px · z∈[-187,+99]mm · 12 of 167 slices shown, 15 images]
[im 12/167  mediastinal]
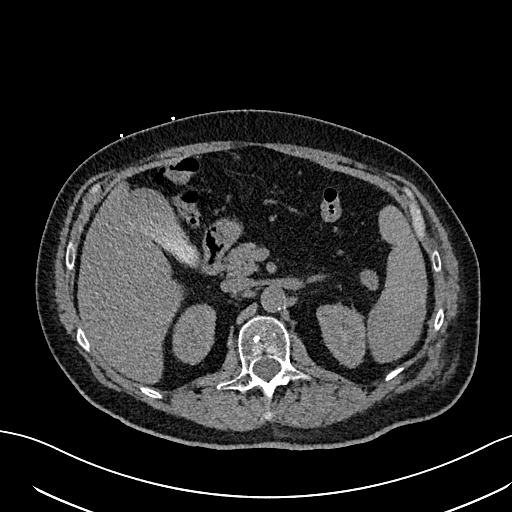
[im 12/167  lung]
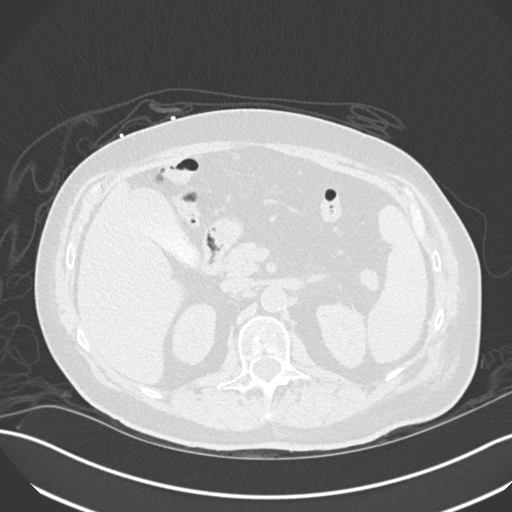
[im 24/167  lung]
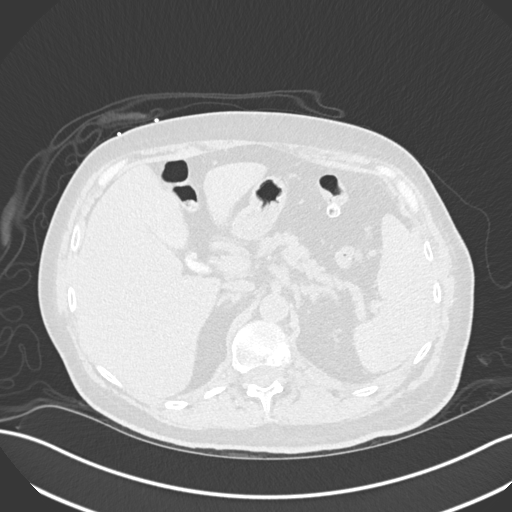
[im 36/167  lung]
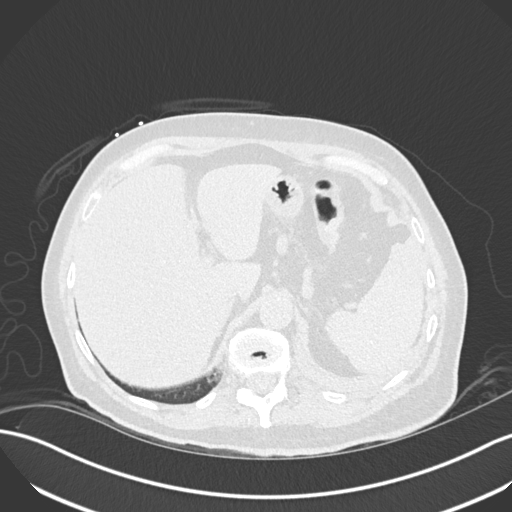
[im 48/167  lung]
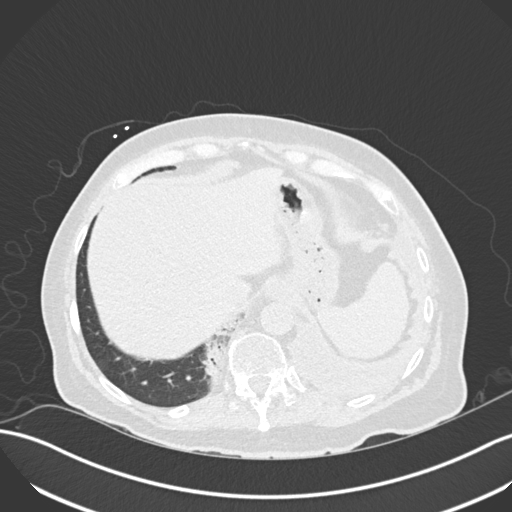
[im 60/167  mediastinal]
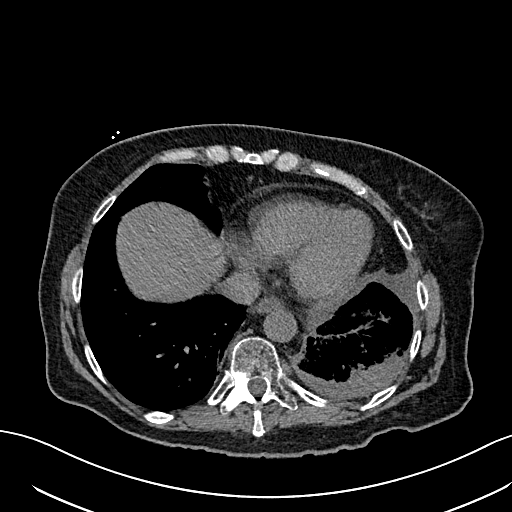
[im 60/167  lung]
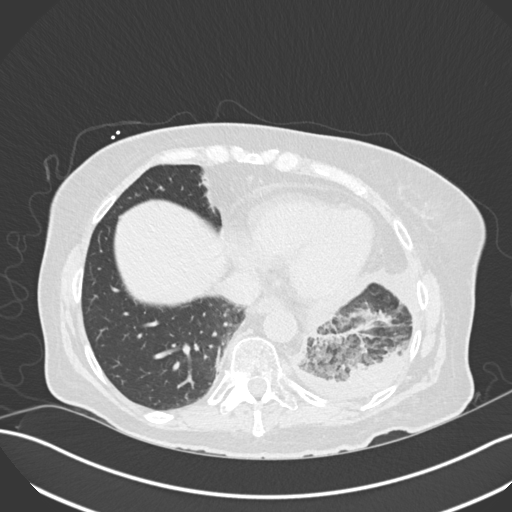
[im 72/167  lung]
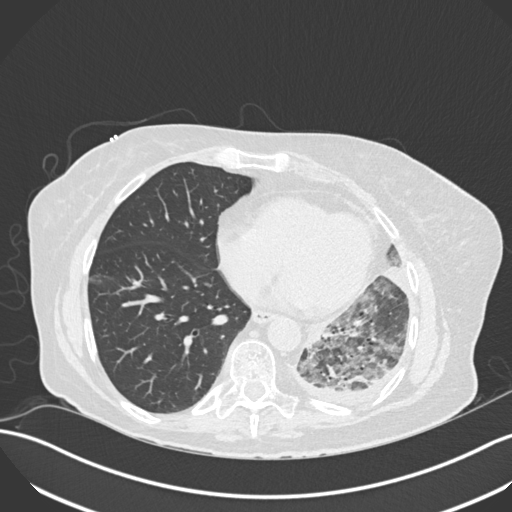
[im 95/167  lung]
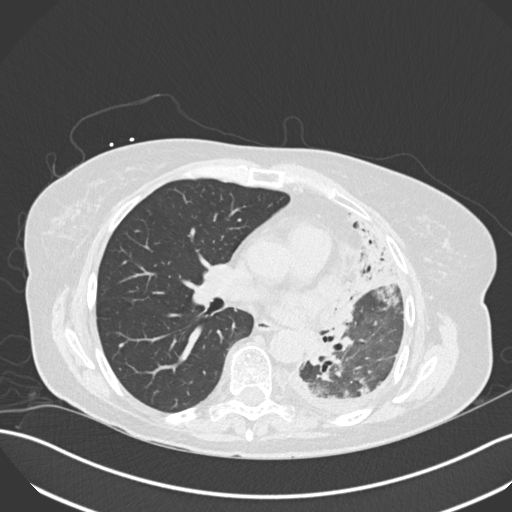
[im 107/167  lung]
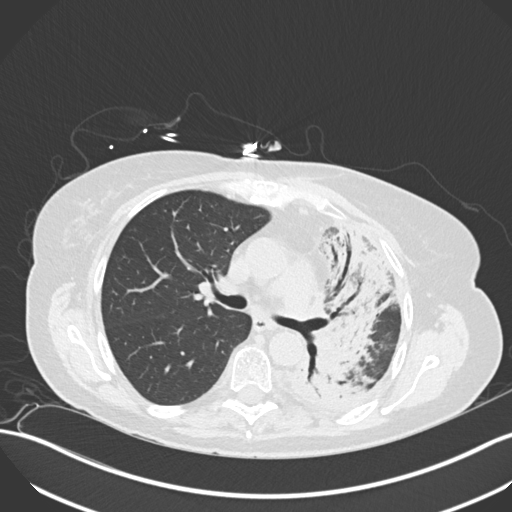
[im 119/167  mediastinal]
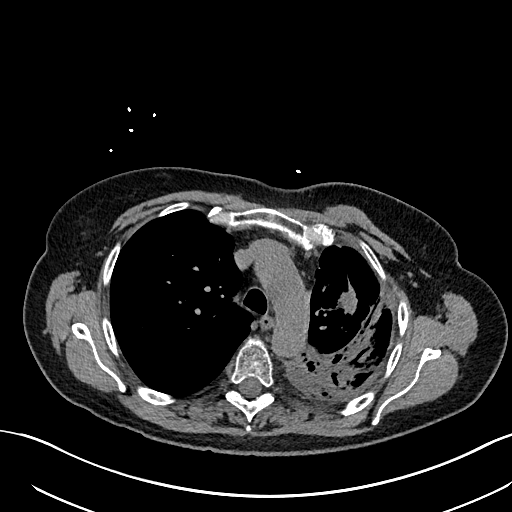
[im 119/167  lung]
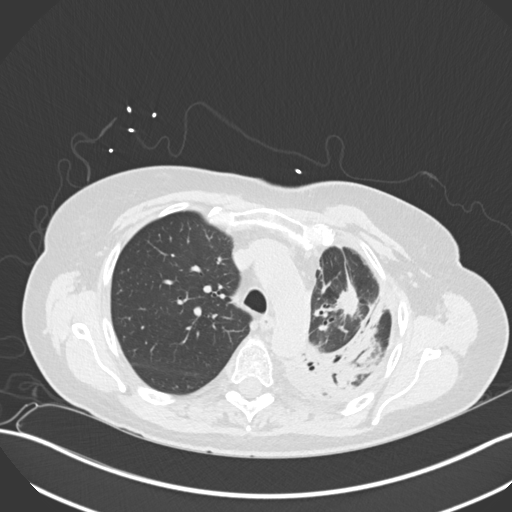
[im 131/167  lung]
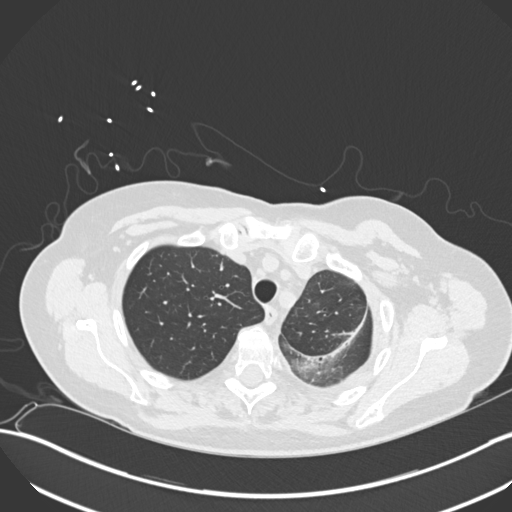
[im 143/167  lung]
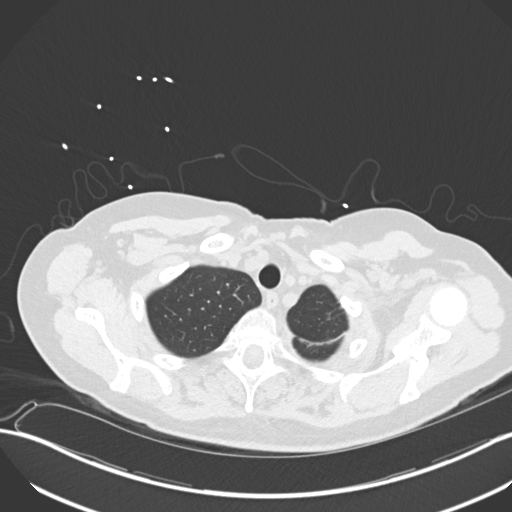
[im 155/167  lung]
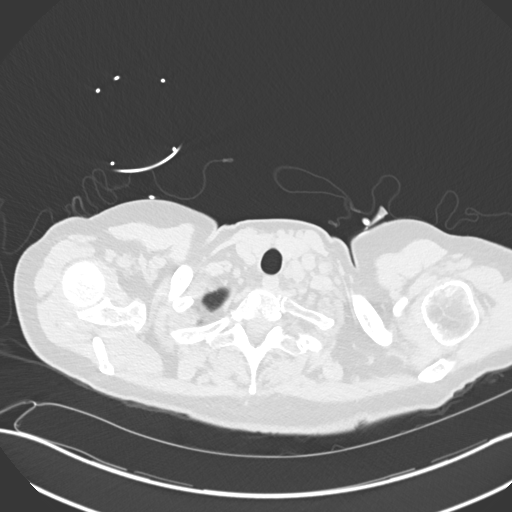

[Series 5: coronal · coronal · 0.72mm/px · 3 of 119 slices shown]
[im 24/119  lung]
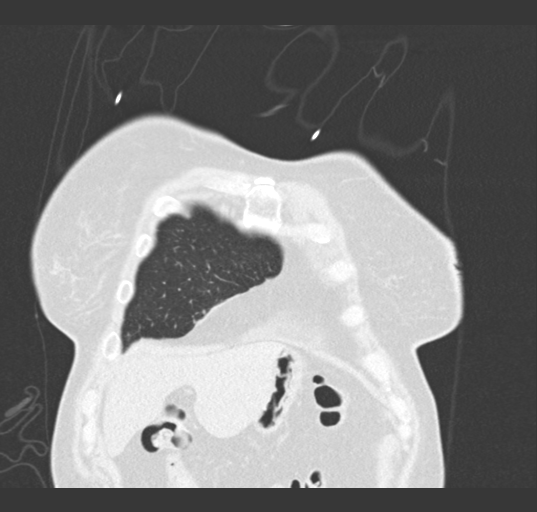
[im 48/119  lung]
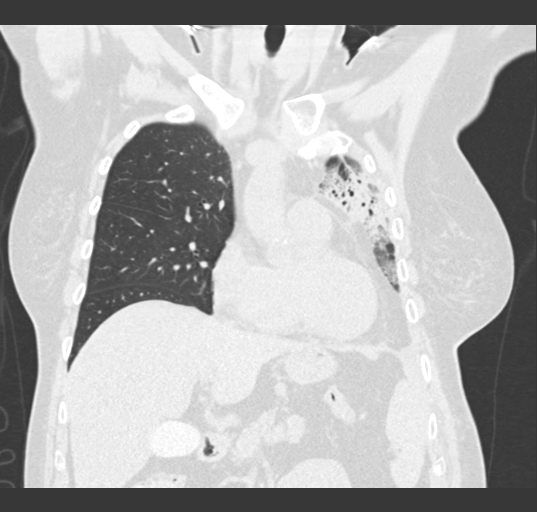
[im 71/119  lung]
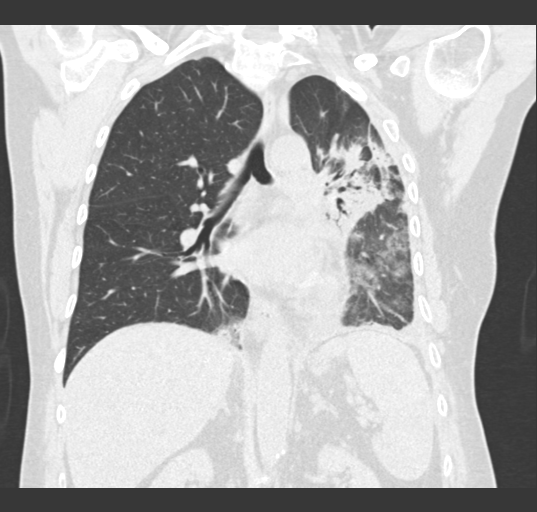

[15 of 36 positions shown; findings below may reference images not displayed]

FINDINGS: Cardiovascular: Heart size is normal. There is atherosclerotic
calcification of the coronary arteries. Normal noncontrast
appearance of the pulmonary arteries. There is minimal
atherosclerosis of the thoracic aorta, not associated with aneurysm.

Mediastinum/Nodes: The visualized portion of the thyroid gland has a
normal appearance. Esophagus is normal in appearance. Enlarged LEFT
supraclavicular lymph nodes, largest 1.4 centimeters. Previously,
largest was 1.0 centimeters. Small mediastinal and hilar lymph
nodes, none of which reach criteria for enlargement.

Lungs/Pleura: There is no pneumothorax following thoracentesis.
Persistent pleural collection identified posteriorly at the LEFT
lung base. Although the study is not performed with intravenous
contrast, suspect pleural thickening particularly at the LEFT lung
base. Focal consolidations/mass in the LEFT lung apex is 1.6 x
centimeters, previously 2.3 x 1.8 centimeters. Extensive
consolidations seen throughout the LEFT UPPER lobe with a extensive
air bronchograms. Ground-glass opacities are identified in the LEFT
LOWER lobe. The LEFT UPPER lobe opacities are more confluent when
compared with the prior study.

There is focal consolidations/atelectasis at the MEDIAL RIGHT lung
base, associated with air bronchograms and new since the prior
study. The RIGHT lung is hyperinflated.

Upper Abdomen: Contrast identified within the gallbladder and
collecting systems of the kidneys. Colonic diverticulosis is noted.
The adrenal glands are normal in appearance.

Musculoskeletal: Numerous blastic lesions within the thoracic and
UPPER lumbar spine, ribs, and LEFT scapula. Stable compression
fracture of T12.
IMPRESSION: 1. No pneumothorax following thoracentesis.
2. Persistent pleural collection and pleural thickening at the LEFT
lung base.
3. Interval development of focal consolidation RIGHT lung base.
4. More confluent opacity within the LEFT UPPER lobe.
5. Interval enlargement of LEFT supraclavicular lymph nodes,
suspicious for metastatic disease.
6. Stable compression fracture of T12. Sclerotic osseous metastases.
7. Coronary artery disease.
8. Colonic diverticulosis.
9.  Aortic atherosclerosis.  ([BV]-[BV])

## 2019-04-17 IMAGING — DX PORTABLE CHEST - 1 VIEW
1 series · 1 of 1 positions shown · non-contrast
Comparison: Chest CT [DATE]. Lung bases from abdominal CT
earlier this day.

CLINICAL DATA: Shortness of breath. History of lung cancer.
Questionable pna on CT

EXAM:
PORTABLE CHEST 1 VIEW

[chest ap]
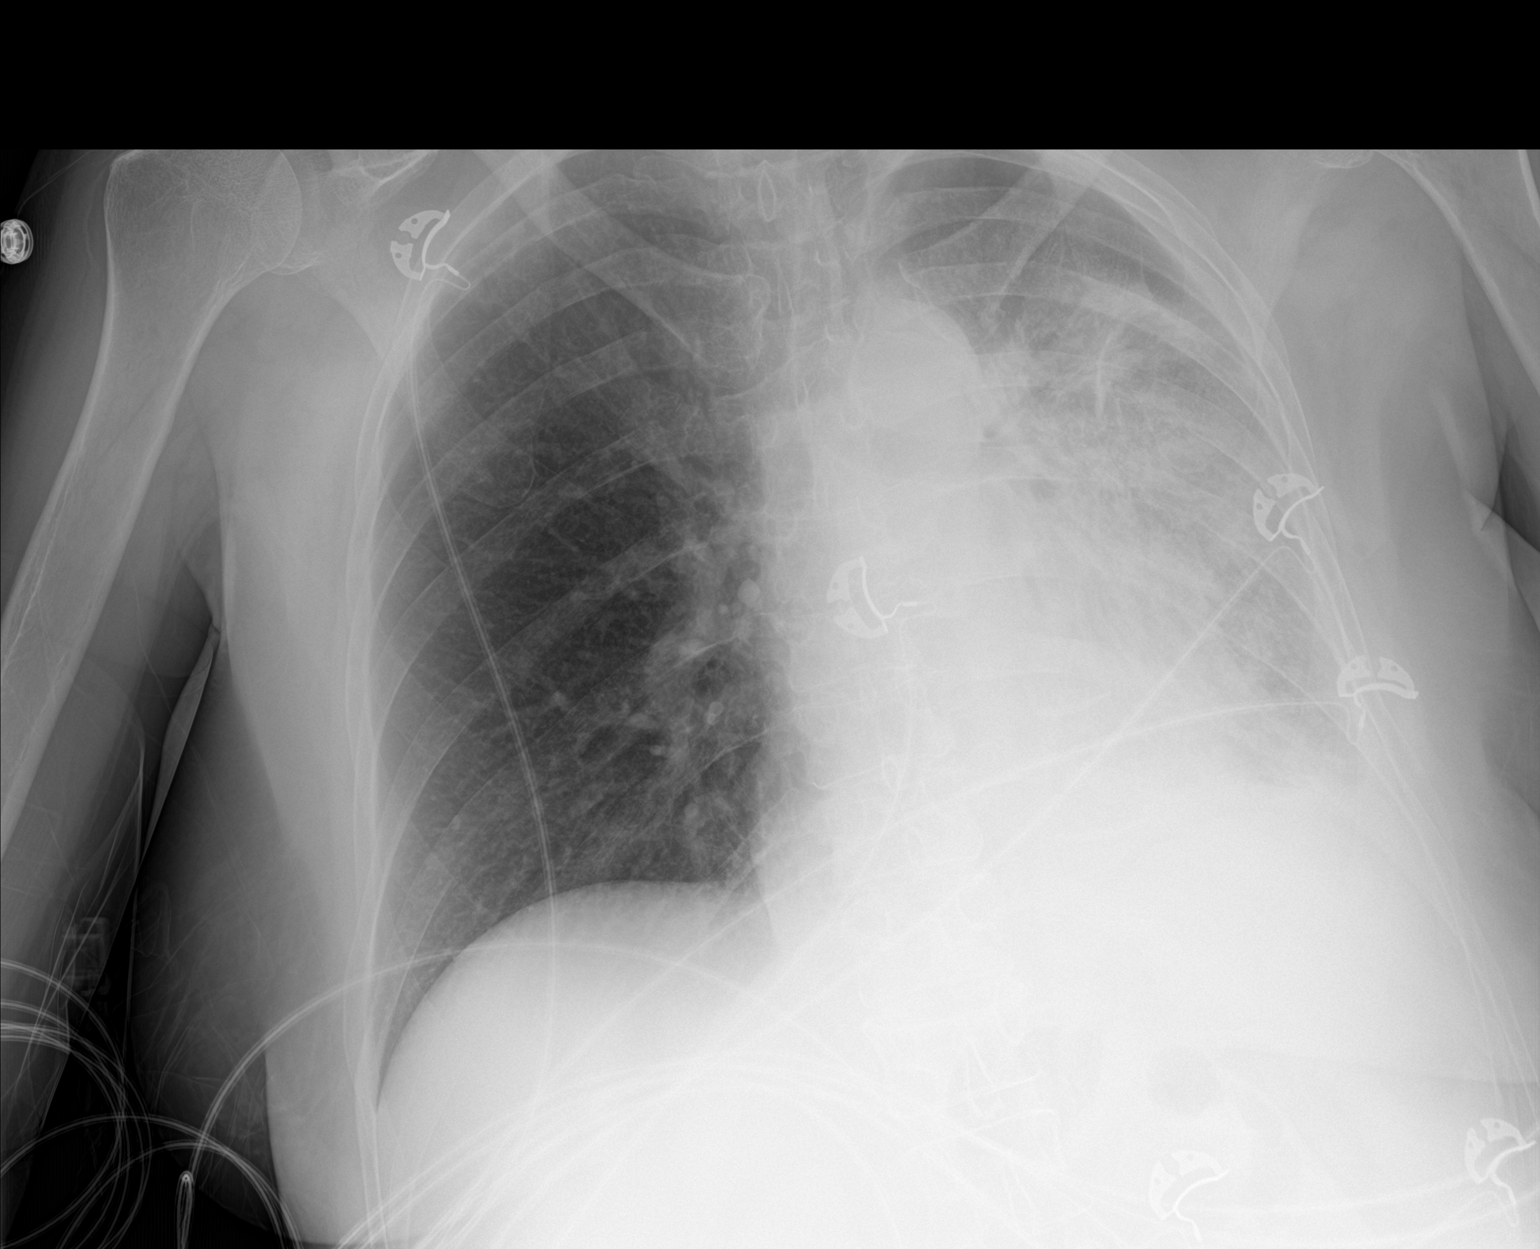

[1 of 1 positions shown; findings below may reference images not displayed]

FINDINGS: Increased volume loss in left hemithorax with left perihilar opacity
from prior chest CT. Left pleural effusion. Grossly unchanged heart
size and mediastinal contours, left heart border obscured. No
pulmonary edema. The consolidation in the medial right lower lobe on
CT is not seen radiographically. Right lung is otherwise clear.
IMPRESSION: 1. Increased volume loss in the left hemithorax and perihilar
density since prior CT. Findings may be due to post treatment
related or postobstructive change, patient with known left lung
cancer. Left pleural effusion.
2. The right lung base consolidation on abdominal CT earlier today
is not visualized radiographically.

## 2019-04-17 IMAGING — US US THORACENTESIS ASP PLEURAL SPACE W/IMG GUIDE
1 series · 6 of 6 positions shown · non-contrast
Comparison: none

INDICATION: Patient with history of metastatic lung cancer, recurrent malignant
left pleural effusion; request made for diagnostic and therapeutic
left thoracentesis.

[Series 1: us thoracentesis asp pleural space w/img guide · 6 of 6 slices shown]
[im 1/6]
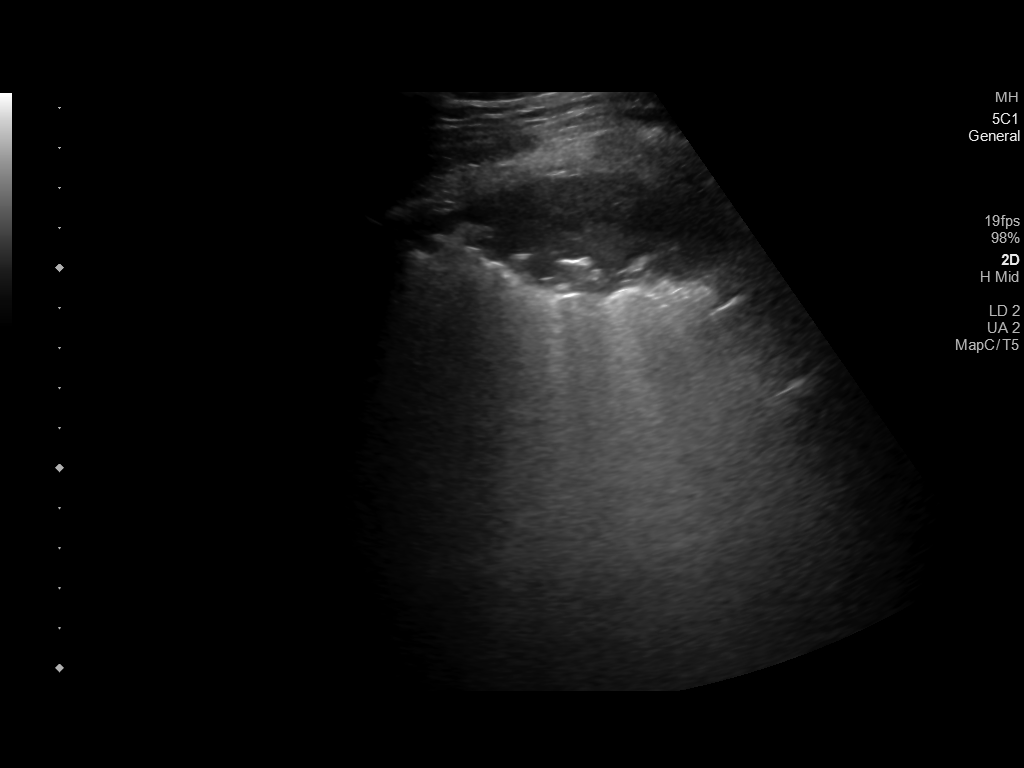
[im 2/6]
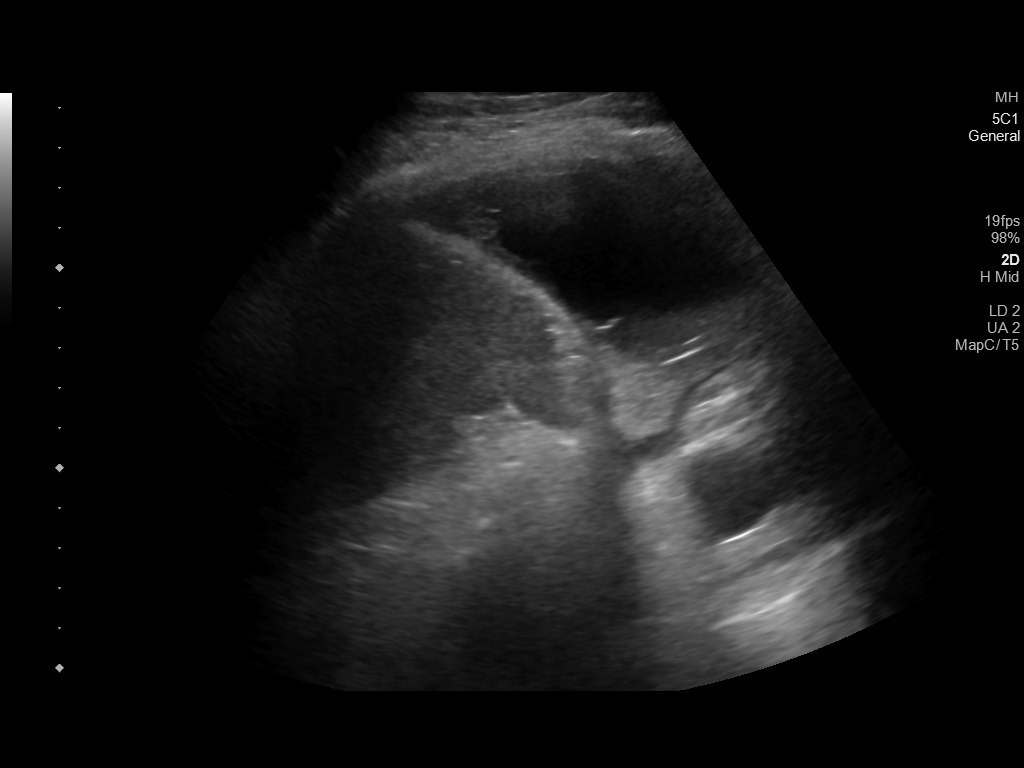
[im 3/6]
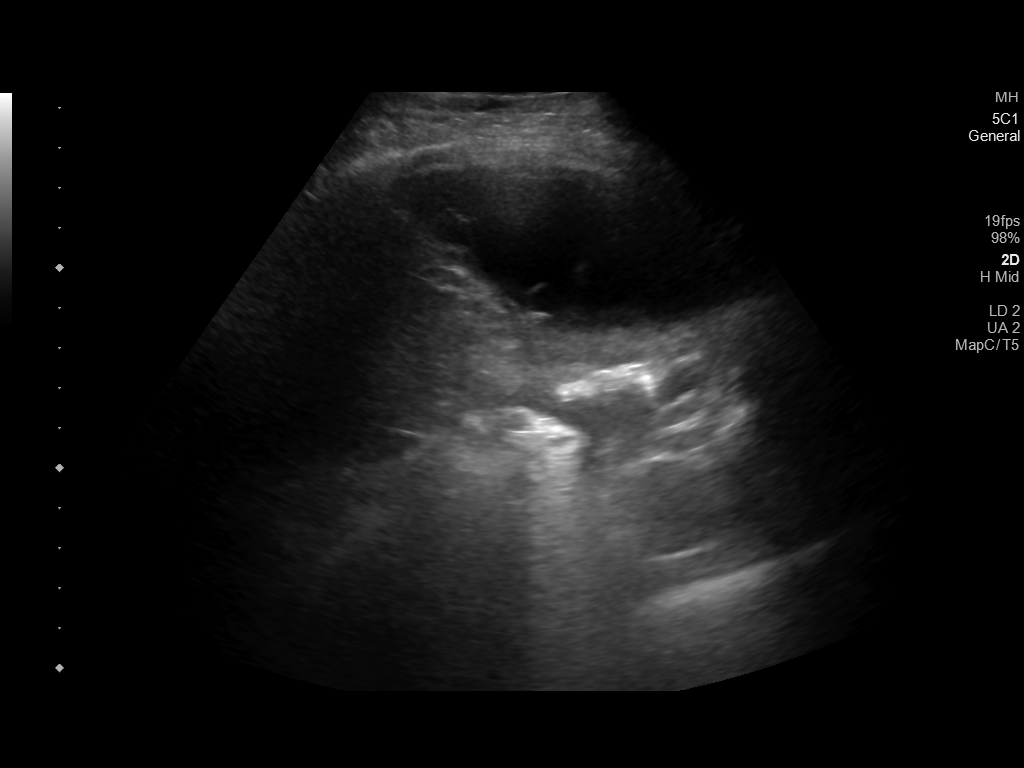
[im 4/6]
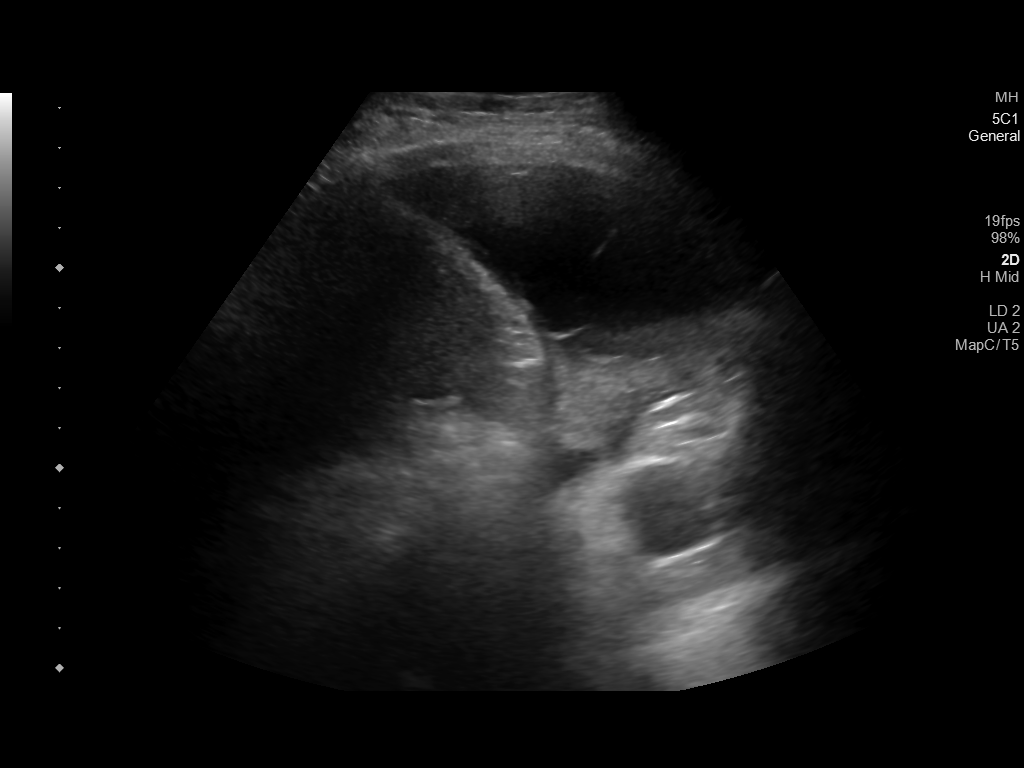
[im 5/6]
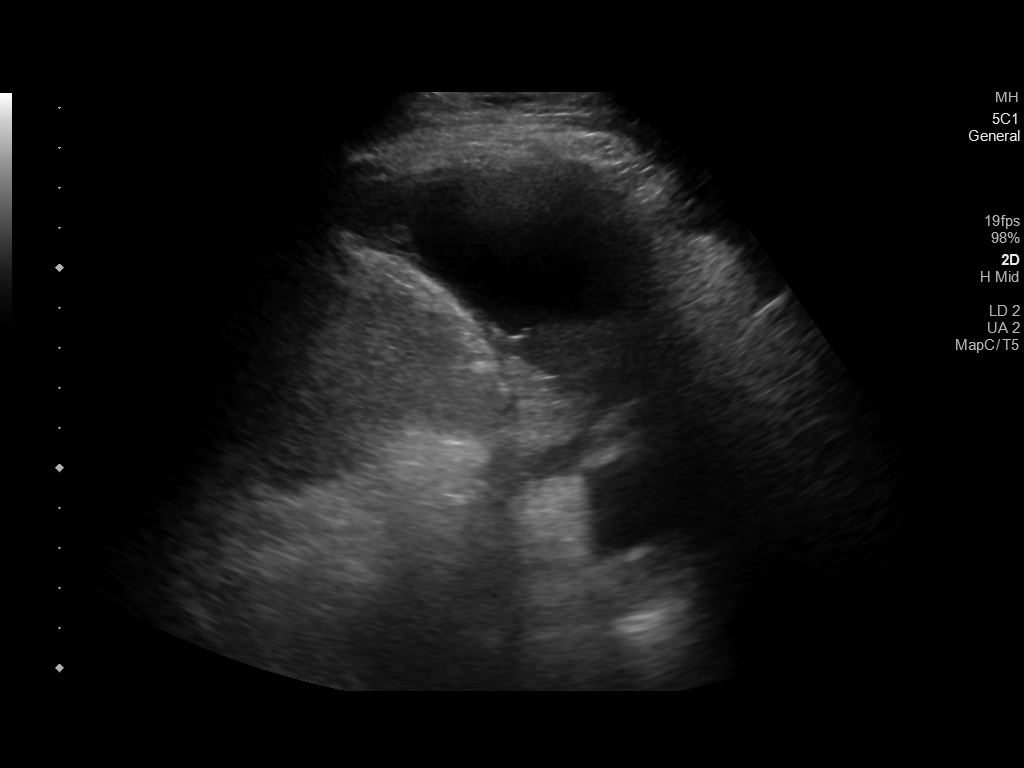
[im 6/6]
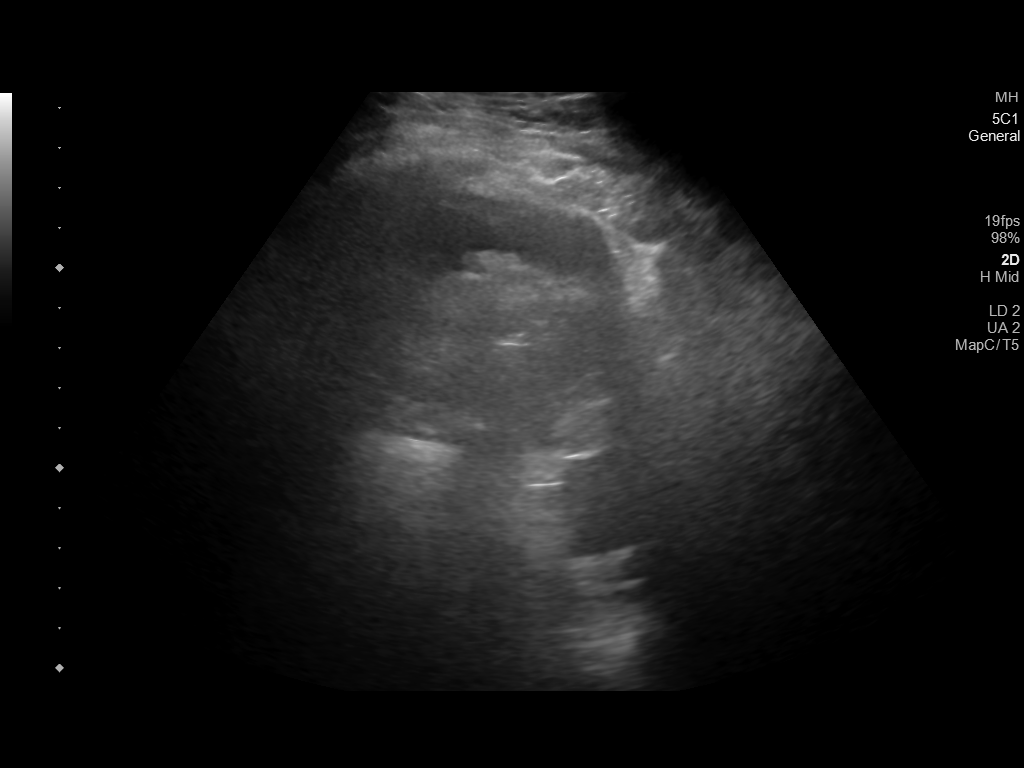

[6 of 6 positions shown; findings below may reference images not displayed]

EXAM:
ULTRASOUND GUIDED DIAGNOSTIC AND THERAPEUTIC LEFT THORACENTESIS

MEDICATIONS:
None

COMPLICATIONS:
None immediate.

PROCEDURE:
An ultrasound guided thoracentesis was thoroughly discussed with the
patient and questions answered. The benefits, risks, alternatives
and complications were also discussed. The patient understands and
wishes to proceed with the procedure. Written consent was obtained.

Ultrasound was performed to localize and mark an adequate pocket of
fluid in the left chest. The area was then prepped and draped in the
normal sterile fashion. 1% Lidocaine was used for local anesthesia.
Under ultrasound guidance a 6 Fr Safe-T-Centesis catheter was
introduced. Thoracentesis was performed. The catheter was removed
and a dressing applied.
FINDINGS: A total of approximately 100 cc of amber/blood-tinged fluid was
removed. Samples were sent to the laboratory as requested by the
clinical team. The amount of free pleural fluid was small on today's
ultrasound and multiloculated.
IMPRESSION: Successful ultrasound guided diagnostic and therapeutic left
thoracentesis yielding 100 cc of pleural fluid.

## 2019-04-17 MED ORDER — FAMOTIDINE 20 MG PO TABS: 20.0000 mg | ORAL_TABLET | Freq: Every day | ORAL | Status: DC

## 2019-04-17 MED ORDER — PROMETHAZINE HCL 25 MG/ML IJ SOLN
12.5000 mg | Freq: Three times a day (TID) | INTRAMUSCULAR | Status: DC | PRN
  Administered 2019-04-17 – 2019-04-20 (×4): 12.5 mg via INTRAVENOUS
  Filled 2019-04-17 (×6): qty 1

## 2019-04-17 MED ORDER — NON FORMULARY: 80.0000 mg | Freq: Every day | Status: DC

## 2019-04-17 MED ORDER — LORATADINE 10 MG PO TABS
10.0000 mg | ORAL_TABLET | Freq: Every day | ORAL | Status: DC
  Administered 2019-04-17 – 2019-04-23 (×6): 10 mg via ORAL
  Filled 2019-04-17 (×7): qty 1

## 2019-04-17 MED ORDER — ACETAMINOPHEN 650 MG RE SUPP: 650.0000 mg | Freq: Four times a day (QID) | RECTAL | Status: DC | PRN

## 2019-04-17 MED ORDER — LEVALBUTEROL HCL 0.63 MG/3ML IN NEBU: 0.6300 mg | INHALATION_SOLUTION | Freq: Four times a day (QID) | RESPIRATORY_TRACT | Status: DC | PRN

## 2019-04-17 MED ORDER — BISACODYL 10 MG RE SUPP
10.0000 mg | Freq: Every day | RECTAL | Status: DC | PRN
  Administered 2019-04-22: 10 mg via RECTAL
  Filled 2019-04-17: qty 1

## 2019-04-17 MED ORDER — SODIUM CHLORIDE 0.9 % IV SOLN
2.0000 g | INTRAVENOUS | Status: AC
  Administered 2019-04-17 – 2019-04-21 (×5): 2 g via INTRAVENOUS
  Filled 2019-04-17 (×4): qty 2
  Filled 2019-04-17: qty 20

## 2019-04-17 MED ORDER — LEVALBUTEROL TARTRATE 45 MCG/ACT IN AERO: 1.0000 | INHALATION_SPRAY | RESPIRATORY_TRACT | Status: DC | PRN

## 2019-04-17 MED ORDER — SODIUM CHLORIDE 0.9 % IV SOLN
500.0000 mg | INTRAVENOUS | Status: AC
  Administered 2019-04-17 – 2019-04-21 (×5): 500 mg via INTRAVENOUS
  Filled 2019-04-17 (×5): qty 500

## 2019-04-17 MED ORDER — MORPHINE SULFATE (PF) 4 MG/ML IV SOLN
4.0000 mg | INTRAVENOUS | Status: DC | PRN
  Administered 2019-04-17 – 2019-04-22 (×22): 4 mg via INTRAVENOUS
  Filled 2019-04-17 (×22): qty 1

## 2019-04-17 MED ORDER — PANTOPRAZOLE SODIUM 40 MG IV SOLR
40.0000 mg | INTRAVENOUS | Status: DC
  Administered 2019-04-17 – 2019-04-22 (×6): 40 mg via INTRAVENOUS
  Filled 2019-04-17 (×7): qty 40

## 2019-04-17 MED ORDER — BOOST / RESOURCE BREEZE PO LIQD CUSTOM
1.0000 | Freq: Three times a day (TID) | ORAL | Status: DC
  Administered 2019-04-18 – 2019-04-22 (×10): 1 via ORAL

## 2019-04-17 MED ORDER — PERFLUTREN LIPID MICROSPHERE
1.0000 mL | INTRAVENOUS | Status: AC | PRN
  Filled 2019-04-17: qty 10

## 2019-04-17 MED ORDER — POLYETHYLENE GLYCOL 3350 17 G PO PACK
17.0000 g | PACK | Freq: Every day | ORAL | Status: DC | PRN
  Administered 2019-04-20 – 2019-04-21 (×2): 17 g via ORAL
  Filled 2019-04-17 (×2): qty 1

## 2019-04-17 MED ORDER — METOCLOPRAMIDE HCL 5 MG/ML IJ SOLN
10.0000 mg | Freq: Once | INTRAMUSCULAR | Status: AC
  Administered 2019-04-17: 06:00:00 10 mg via INTRAVENOUS
  Filled 2019-04-17: qty 2

## 2019-04-17 MED ORDER — LEVOTHYROXINE SODIUM 112 MCG PO TABS
112.0000 ug | ORAL_TABLET | Freq: Every day | ORAL | Status: DC
  Administered 2019-04-17 – 2019-04-23 (×7): 112 ug via ORAL
  Filled 2019-04-17 (×7): qty 1

## 2019-04-17 MED ORDER — OSIMERTINIB MESYLATE 80 MG PO TABS
80.0000 mg | ORAL_TABLET | Freq: Every day | ORAL | Status: DC
  Administered 2019-04-18 – 2019-04-21 (×4): 80 mg via ORAL

## 2019-04-17 MED ORDER — ACETAMINOPHEN 325 MG PO TABS
650.0000 mg | ORAL_TABLET | Freq: Four times a day (QID) | ORAL | Status: DC | PRN
  Filled 2019-04-17: qty 2

## 2019-04-17 MED ORDER — ONDANSETRON HCL 4 MG PO TABS: 4.0000 mg | ORAL_TABLET | Freq: Four times a day (QID) | ORAL | Status: DC | PRN

## 2019-04-17 MED ORDER — POTASSIUM CHLORIDE IN NACL 40-0.9 MEQ/L-% IV SOLN
INTRAVENOUS | Status: DC
  Administered 2019-04-17 – 2019-04-21 (×6): 75 mL/h via INTRAVENOUS
  Filled 2019-04-17 (×8): qty 1000

## 2019-04-17 MED ORDER — MORPHINE SULFATE (PF) 2 MG/ML IV SOLN
2.0000 mg | INTRAVENOUS | Status: DC | PRN
  Administered 2019-04-17: 2 mg via INTRAVENOUS
  Filled 2019-04-17: qty 1

## 2019-04-17 MED ORDER — OSIMERTINIB MESYLATE 80 MG PO TABS: 80.0000 mg | ORAL_TABLET | Freq: Every day | ORAL | Status: DC

## 2019-04-17 MED ORDER — MAGNESIUM CITRATE PO SOLN: 1.0000 | Freq: Once | ORAL | Status: DC | PRN

## 2019-04-17 MED ORDER — LIDOCAINE HCL 1 % IJ SOLN
INTRAMUSCULAR | Status: AC
  Filled 2019-04-17: qty 20

## 2019-04-17 MED ORDER — SODIUM CHLORIDE 0.9 % IV SOLN
INTRAVENOUS | Status: DC
  Administered 2019-04-17: 10:00:00 via INTRAVENOUS
  Filled 2019-04-17 (×2): qty 1000

## 2019-04-17 MED ORDER — ONDANSETRON HCL 4 MG/2ML IJ SOLN
4.0000 mg | Freq: Four times a day (QID) | INTRAMUSCULAR | Status: DC | PRN
  Administered 2019-04-17 – 2019-04-21 (×9): 4 mg via INTRAVENOUS
  Filled 2019-04-17 (×8): qty 2

## 2019-04-17 MED ORDER — FLUCONAZOLE IN SODIUM CHLORIDE 200-0.9 MG/100ML-% IV SOLN
200.0000 mg | INTRAVENOUS | Status: DC
  Administered 2019-04-17: 200 mg via INTRAVENOUS
  Filled 2019-04-17: qty 100

## 2019-04-17 MED ORDER — OXYCODONE HCL 5 MG PO TABS
5.0000 mg | ORAL_TABLET | ORAL | Status: DC | PRN
  Administered 2019-04-18 – 2019-04-23 (×7): 5 mg via ORAL
  Filled 2019-04-17 (×7): qty 1

## 2019-04-17 MED ORDER — MONTELUKAST SODIUM 10 MG PO TABS
10.0000 mg | ORAL_TABLET | Freq: Every day | ORAL | Status: DC
  Administered 2019-04-17 – 2019-04-22 (×6): 10 mg via ORAL
  Filled 2019-04-17 (×6): qty 1

## 2019-04-17 NOTE — Procedures (Signed)
Ultrasound-guided diagnostic and therapeutic left thoracentesis performed yielding 100 cc of amber/blood tinged fluid. No immediate complications. The fluid was sent to the lab for preordered studies. The pleural fluid amount was small and loculated by [place]. Pt is scheduled for CT chest afterwards. EBL < 1 cc.

## 2019-04-17 NOTE — Progress Notes (Signed)
Echocardiogram 2D Echocardiogram has been performed.  [name] 04/17/2019, 1:54 PM

## 2019-04-17 NOTE — Plan of Care (Signed)

## 2019-04-17 NOTE — Progress Notes (Signed)
With consent of pt, pt's spouse Legrand Como was called with updates to plan of care. Stacey Drain

## 2019-04-17 NOTE — Progress Notes (Signed)
HEMATOLOGY-ONCOLOGY PROGRESS NOTE  SUBJECTIVE: The patient was admitted with nausea, vomiting, weakness.   On admission, a CT of the abdomen and pelvis showed multiple splenic infarcts which are new from prior exam, small wedge-shaped defect in the superior right kidney, new from prior exam and given splenic infarcts, small infarct is favored over pyelonephritis, unchanged cystic lesion in the pancreatic neck with internal calcification, mild central depression of the superior L3 which is new and may reflect interval mild pathologic compression fracture, unchanged multifocal sclerotic osseous metastatic disease, mild L4 and T12 compression fractures are unchanged, linear consolidation in the medial right lower lobe, not seen previously which could reflect post radiation changes versus pneumonia, chronic partially loculated left pleural effusion, colonic diverticulosis without diverticulitis. The patient has been on Eliquis for pulmonary emboli with no missed doses. The patient has a history of stage IV non-small cell lung cancer, adenocarcinoma.  She is currently on Tagrisso 80 mg daily.   She report ongoing nausea this am. No vomiting or diarrhea. Has remained afebrile. No chest discomfort or shortness of breath.   REVIEW OF SYSTEMS:   Constitutional: Denies fevers, chills.  Reports fatigue and weakness. Eyes: Denies blurriness of vision Ears, nose, mouth, throat, and face: Denies mucositis or sore throat Respiratory: Denies cough, dyspnea or wheezes Cardiovascular: Denies palpitation, chest discomfort Gastrointestinal: Reports nausea this morning but no vomiting or diarrhea. Skin: Denies abnormal skin rashes Lymphatics: Denies new lymphadenopathy or easy bruising Neurological:Denies numbness, tingling or new weaknesses Behavioral/Psych: Mood is stable, no new changes  Extremities: No lower extremity edema All other systems were reviewed with the patient and are negative.  I have reviewed the  past medical history, past surgical history, social history and family history with the patient and they are unchanged from previous note.   PHYSICAL EXAMINATION: ECOG PERFORMANCE STATUS: 1 - Symptomatic but completely ambulatory  Vitals:   04/17/19 0031 04/17/19 0433  BP:  130/86  Pulse:  (!) 111  Resp:  20  Temp: 97.7 F (36.5 C) 98.2 F (36.8 C)  SpO2:  96%   Filed Weights   04/16/19 2043 04/17/19 0028 04/17/19 0433  Weight: 155 lb (70.3 kg) 150 lb (68 kg) 148 lb 3.2 oz (67.2 kg)    Intake/Output from previous day: 07/05 0701 - 07/06 0700 In: 300 [IV Piggyback:300] Out: 0   GENERAL:alert, no distress and comfortable SKIN: skin color, texture, turgor are normal, no rashes or significant lesions EYES: normal, Conjunctiva are pink and non-injected, sclera clear OROPHARYNX:no exudate, no erythema and lips, buccal mucosa, and tongue normal  NECK: supple, thyroid normal size, non-tender, without nodularity LYMPH:  no palpable lymphadenopathy in the cervical, axillary or inguinal LUNGS: Normal breathing effort, diminished in the left base.   HEART: regular rate & rhythm and no murmurs and no lower extremity edema ABDOMEN:abdomen soft, non-tender and normal bowel sounds Musculoskeletal:no cyanosis of digits and no clubbing  NEURO: alert & oriented x 3 with fluent speech, no focal motor/sensory deficits  LABORATORY DATA:  I have reviewed the data as listed CMP Latest Ref Rng & Units 04/17/2019 04/16/2019 03/17/2019  Glucose 70 - 99 mg/dL 134(H) 128(H) 100(H)  BUN 6 - 20 mg/dL 14 14 9   Creatinine 0.44 - 1.00 mg/dL 0.64 0.69 0.79  Sodium 135 - 145 mmol/L 140 142 139  Potassium 3.5 - 5.1 mmol/L 3.8 3.8 3.8  Chloride 98 - 111 mmol/L 105 105 107  CO2 22 - 32 mmol/L 24 22 24   Calcium 8.9 - 10.3 mg/dL  9.2 9.5 9.4  Total Protein 6.5 - 8.1 g/dL - 7.1 6.6  Total Bilirubin 0.3 - 1.2 mg/dL - 0.8 0.8  Alkaline Phos 38 - 126 U/L - 77 83  AST 15 - 41 U/L - 41 20  ALT 0 - 44 U/L - 16 13     Lab Results  Component Value Date   WBC 10.6 (H) 04/17/2019   HGB 12.6 04/17/2019   HCT 37.7 04/17/2019   MCV 89.1 04/17/2019   PLT 155 04/17/2019   NEUTROABS 9.5 (H) 04/16/2019    Ct Chest W Contrast  Result Date: 03/24/2019 CLINICAL DATA:  Stage IV left upper lobe non-small cell lung cancer status post targeted chemotherapy, palliative radiotherapy to the brain, lung mass and bone metastases and left PleurX catheter placement. Restaging. EXAM: CT CHEST, ABDOMEN, AND PELVIS WITH CONTRAST TECHNIQUE: Multidetector CT imaging of the chest, abdomen and pelvis was performed following the standard protocol during bolus administration of intravenous contrast. CONTRAST:  66mL OMNIPAQUE IOHEXOL 300 MG/ML  SOLN COMPARISON:  01/28/2019 chest CT angiogram. 12/12/2018 PET-CT. 11/11/2018 chest CT. FINDINGS: CT CHEST FINDINGS Cardiovascular: Normal heart size. No significant pericardial effusion/thickening. Atherosclerotic nonaneurysmal thoracic aorta. Normal caliber pulmonary arteries. No central pulmonary emboli. Mediastinum/Nodes: No discrete thyroid nodules. Unremarkable esophagus. No axillary adenopathy. Multiple mildly enlarged left supraclavicular nodes, largest 1.2 cm (series 2/image 5), previously 1.0 cm on 01/27/2019 chest CT, minimally increased. No pathologically enlarged mediastinal or hilar nodes. Stable calcified left suprahilar 0.7 cm short axis node. Lungs/Pleura: No pneumothorax. Interval removal of left PleurX catheter. Small loculated dependent left pleural effusion with mildly irregular left pleural thickening, not definitely changed. No right pleural effusion. Improved aeration in the left upper lobe. Irregular nodular 2.3 x 1.8 cm focus of consolidation in the left upper lobe (series 5/image 42), previously obscured by atelectasis and consolidation on 01/27/2019 chest CT. Sharply marginated extensive left perihilar consolidation with associated extensive patchy ground-glass opacity  throughout the left lung, largely new in the interval. Mild compressive atelectasis in the dependent left lower lobe. No acute consolidative airspace disease, lung masses or significant pulmonary nodules in the right lung. Musculoskeletal: There are widespread sclerotic osseous lesions throughout thoracic spine, bilateral ribs and bilateral scapula, which have significantly increased in sclerosis since 01/27/2019, for example in the anterior T8 vertebral body and in the upper left scapula (series 6/image 100). No definite new osseous lesions in the chest. Mild superior T12 vertebral compression fracture is new since 01/27/2019 chest CT. Moderate thoracic spondylosis. CT ABDOMEN PELVIS FINDINGS Hepatobiliary: Normal liver with no liver mass. Normal gallbladder with no radiopaque cholelithiasis. No biliary ductal dilatation. Pancreas: Low-attenuation 1.7 cm pancreatic neck lesion with coarse internal calcification (series 2/image 60), not appreciably changed since 11/11/2018 CT. No new pancreatic lesions. No pancreatic duct dilation. Spleen: Normal size. No mass. Adrenals/Urinary Tract: Normal adrenals. Normal kidneys with no hydronephrosis and no renal mass. Normal collapsed bladder. Stomach/Bowel: Small hiatal hernia. Otherwise normal nondistended stomach. Normal caliber small bowel with no small bowel wall thickening. Normal appendix. Oral contrast transits to the colon. Mild left colonic diverticulosis, with no large bowel wall thickening or significant pericolonic fat stranding. Vascular/Lymphatic: Atherosclerotic nonaneurysmal abdominal aorta. Patent portal, splenic, hepatic and renal veins. No pathologically enlarged lymph nodes in the abdomen or pelvis. Reproductive: Grossly normal uterus.  No adnexal mass. Other: No pneumoperitoneum, ascites or focal fluid collection. Musculoskeletal: There are widespread sclerotic osseous lesions throughout the lumbar spine, sacrum, bilateral iliac bones and proximal femora  bilaterally, which are significantly  increased in sclerosis compared to the CT images from 12/12/2018 PET-CT study, correlating with the areas of skeletal hypermetabolism on that PET-CT study. No definite new focal osseous lesions. Mild superior L4 vertebral compression fracture is probably stable and chronic compared to the coronal reformats on 12/12/2018 PET-CT. Moderate lumbar spondylosis. IMPRESSION: 1. Widespread sclerotic osseous metastases throughout axial and proximal appendicular skeleton, significantly increased in sclerosis in the interval, a nonspecific change that probably reflects treatment effect. No definite new osseous lesions. 2. Mild left supraclavicular lymphadenopathy is slightly increased since 01/27/2019 chest CT angiogram study. 3. Aeration in the left upper lobe is improved in the interval. Irregular nodular focus of consolidation in the left upper lobe was previously obscured by consolidation/atelectasis, limiting comparison. New extensive sharply marginated perihilar consolidation and ground-glass opacity in the left lung. Findings are nonspecific but favored to represent evolving postradiation change. Close chest CT follow-up recommended. 4. Stable loculated small left pleural effusion with left pleural thickening. 5. Low-attenuation pancreatic neck lesion with internal coarse calcification is stable since 11/11/2018 CT, probably nonaggressive cystic pancreatic lesion. 6.  Aortic Atherosclerosis (ICD10-I70.0). Electronically Signed   By: Ilona Sorrel M.D.   On: 03/24/2019 17:48   Mr Jeri Cos MC Contrast  Result Date: 04/03/2019 CLINICAL DATA:  Restaging of non-small cell lung cancer. Two brain metastases treated with SRS on 12/30/2018. EXAM: MRI HEAD WITHOUT AND WITH CONTRAST TECHNIQUE: Multiplanar, multiecho pulse sequences of the brain and surrounding structures were obtained without and with intravenous contrast. CONTRAST:  21mL MULTIHANCE GADOBENATE DIMEGLUMINE 529 MG/ML IV SOLN  COMPARISON:  12/25/2018 FINDINGS: BRAIN New Lesions: None. Larger lesions: None. Stable or Smaller lesions: 1. Decreased size of 5 mm right frontoparietal lesion (series 11, image 111, previously 14 mm) with nearly completely resolved edema and new chronic blood products. 2. At most faint residual enhancement corresponding to the suspected 2 mm left frontal metastasis on the prior study (series 11, image 87 and series 13, image 27) Other Brain findings: There is no evidence of acute infarct, midline shift, or extra-axial fluid collection. The ventricles and sulci are within normal limits. No age advanced cerebral white matter disease is evident. Vascular: Major intracranial vascular flow voids are preserved. Skull and upper cervical spine: Increased size of multiple skull metastases without significant extraosseous extension. Sinuses/Orbits: Unremarkable orbits. Clear paranasal sinuses. Moderate right mastoid effusion. Other: None. IMPRESSION: 1. Decreased size of the 2 treated brain metastases. 2. No evidence of new brain metastases or acute intracranial abnormality. 3. Interval enlargement of multiple skull metastases. Electronically Signed   By: Logan Bores M.D.   On: 04/03/2019 15:16   Ct Abdomen Pelvis W Contrast  Result Date: 04/16/2019 CLINICAL DATA:  Acute abdominal pain. Nausea and vomiting. Neutropenia. EXAM: CT ABDOMEN AND PELVIS WITH CONTRAST TECHNIQUE: Multidetector CT imaging of the abdomen and pelvis was performed using the standard protocol following bolus administration of intravenous contrast. CONTRAST:  137mL OMNIPAQUE IOHEXOL 300 MG/ML  SOLN COMPARISON:  Most recent imaging CT 03/24/2019, PET CT 12/12/2018 FINDINGS: Lower chest: Left pleural effusion with enhancement, similar to slightly decreased from prior exam. Unchanged compressive atelectasis and ground-glass opacities throughout the left lung base. New linear consolidation with air bronchograms in the medial right lower lobe.  Hepatobiliary: Hepatic steatosis. No focal lesion. Gallbladder physiologically distended, no calcified stone. No biliary dilatation. Pancreas: 17 mm low-density lesion in the pancreatic neck with central quest occasion, unchanged parenchymal atrophy. No ductal dilatation or inflammation. Spleen: Multiple wedge-shaped low-density defects primarily in the mid lower spleen  consistent with infarcts. No perisplenic fluid. Adrenals/Urinary Tract: No adrenal nodule. No hydronephrosis or perinephric edema. Small slightly wedge-shaped defect of the superior right kidney, best appreciated on sagittal image 57 series 6, not present on prior exam. Symmetric excretion on delayed phase imaging. Urinary bladder is physiologically distended without wall thickening. Stomach/Bowel: Small hiatal hernia. Stomach physiologically distended. No bowel wall thickening, inflammatory change, or obstruction. Mild colonic diverticulosis without diverticulitis. Normal appendix. Vascular/Lymphatic: Calcified aortic atherosclerosis. No aneurysm. Patent portal vein and mesenteric vessels. No enlarged lymph nodes in the abdomen or pelvis. Reproductive: Uterus and bilateral adnexa are unremarkable. Other: No ascites. No free air or intra-abdominal fluid collection. Musculoskeletal: Known multifocal sclerotic metastatic disease which appears similar to prior exam. L4 and T12 compression fractures are unchanged from prior. Mild central depression of superior L3 may represent interval mild pathologic compression fracture. IMPRESSION: 1. Multiple splenic infarcts, new from prior exam. 2. Small wedge-shaped defect in the superior right kidney, new from prior exam. Given splenic infarcts, small infarct is favored over pyelonephritis. 3. Unchanged cystic lesion in the pancreatic neck with internal calcification. 4. Mild central depression of superior L3 is new and may reflect interval mild pathologic compression fracture. Unchanged multifocal sclerotic  osseous metastatic disease. Mild L4 and T12 compression fractures are unchanged. 5. Linear consolidation in the medial right lower lobe, not seen previously, this may represent postradiation change versus pneumonia. 6. Chronic partially loculated left pleural effusion. 7. Colonic diverticulosis without diverticulitis. Aortic Atherosclerosis (ICD10-I70.0). Electronically Signed   By: Keith Rake M.D.   On: 04/16/2019 23:52   Ct Abdomen Pelvis W Contrast  Result Date: 03/24/2019 CLINICAL DATA:  Stage IV left upper lobe non-small cell lung cancer status post targeted chemotherapy, palliative radiotherapy to the brain, lung mass and bone metastases and left PleurX catheter placement. Restaging. EXAM: CT CHEST, ABDOMEN, AND PELVIS WITH CONTRAST TECHNIQUE: Multidetector CT imaging of the chest, abdomen and pelvis was performed following the standard protocol during bolus administration of intravenous contrast. CONTRAST:  75mL OMNIPAQUE IOHEXOL 300 MG/ML  SOLN COMPARISON:  01/28/2019 chest CT angiogram. 12/12/2018 PET-CT. 11/11/2018 chest CT. FINDINGS: CT CHEST FINDINGS Cardiovascular: Normal heart size. No significant pericardial effusion/thickening. Atherosclerotic nonaneurysmal thoracic aorta. Normal caliber pulmonary arteries. No central pulmonary emboli. Mediastinum/Nodes: No discrete thyroid nodules. Unremarkable esophagus. No axillary adenopathy. Multiple mildly enlarged left supraclavicular nodes, largest 1.2 cm (series 2/image 5), previously 1.0 cm on 01/27/2019 chest CT, minimally increased. No pathologically enlarged mediastinal or hilar nodes. Stable calcified left suprahilar 0.7 cm short axis node. Lungs/Pleura: No pneumothorax. Interval removal of left PleurX catheter. Small loculated dependent left pleural effusion with mildly irregular left pleural thickening, not definitely changed. No right pleural effusion. Improved aeration in the left upper lobe. Irregular nodular 2.3 x 1.8 cm focus of  consolidation in the left upper lobe (series 5/image 42), previously obscured by atelectasis and consolidation on 01/27/2019 chest CT. Sharply marginated extensive left perihilar consolidation with associated extensive patchy ground-glass opacity throughout the left lung, largely new in the interval. Mild compressive atelectasis in the dependent left lower lobe. No acute consolidative airspace disease, lung masses or significant pulmonary nodules in the right lung. Musculoskeletal: There are widespread sclerotic osseous lesions throughout thoracic spine, bilateral ribs and bilateral scapula, which have significantly increased in sclerosis since 01/27/2019, for example in the anterior T8 vertebral body and in the upper left scapula (series 6/image 100). No definite new osseous lesions in the chest. Mild superior T12 vertebral compression fracture is new since 01/27/2019 chest CT.  Moderate thoracic spondylosis. CT ABDOMEN PELVIS FINDINGS Hepatobiliary: Normal liver with no liver mass. Normal gallbladder with no radiopaque cholelithiasis. No biliary ductal dilatation. Pancreas: Low-attenuation 1.7 cm pancreatic neck lesion with coarse internal calcification (series 2/image 60), not appreciably changed since 11/11/2018 CT. No new pancreatic lesions. No pancreatic duct dilation. Spleen: Normal size. No mass. Adrenals/Urinary Tract: Normal adrenals. Normal kidneys with no hydronephrosis and no renal mass. Normal collapsed bladder. Stomach/Bowel: Small hiatal hernia. Otherwise normal nondistended stomach. Normal caliber small bowel with no small bowel wall thickening. Normal appendix. Oral contrast transits to the colon. Mild left colonic diverticulosis, with no large bowel wall thickening or significant pericolonic fat stranding. Vascular/Lymphatic: Atherosclerotic nonaneurysmal abdominal aorta. Patent portal, splenic, hepatic and renal veins. No pathologically enlarged lymph nodes in the abdomen or pelvis. Reproductive:  Grossly normal uterus.  No adnexal mass. Other: No pneumoperitoneum, ascites or focal fluid collection. Musculoskeletal: There are widespread sclerotic osseous lesions throughout the lumbar spine, sacrum, bilateral iliac bones and proximal femora bilaterally, which are significantly increased in sclerosis compared to the CT images from 12/12/2018 PET-CT study, correlating with the areas of skeletal hypermetabolism on that PET-CT study. No definite new focal osseous lesions. Mild superior L4 vertebral compression fracture is probably stable and chronic compared to the coronal reformats on 12/12/2018 PET-CT. Moderate lumbar spondylosis. IMPRESSION: 1. Widespread sclerotic osseous metastases throughout axial and proximal appendicular skeleton, significantly increased in sclerosis in the interval, a nonspecific change that probably reflects treatment effect. No definite new osseous lesions. 2. Mild left supraclavicular lymphadenopathy is slightly increased since 01/27/2019 chest CT angiogram study. 3. Aeration in the left upper lobe is improved in the interval. Irregular nodular focus of consolidation in the left upper lobe was previously obscured by consolidation/atelectasis, limiting comparison. New extensive sharply marginated perihilar consolidation and ground-glass opacity in the left lung. Findings are nonspecific but favored to represent evolving postradiation change. Close chest CT follow-up recommended. 4. Stable loculated small left pleural effusion with left pleural thickening. 5. Low-attenuation pancreatic neck lesion with internal coarse calcification is stable since 11/11/2018 CT, probably nonaggressive cystic pancreatic lesion. 6.  Aortic Atherosclerosis (ICD10-I70.0). Electronically Signed   By: Ilona Sorrel M.D.   On: 03/24/2019 17:48   Dg Chest Portable 1 View  Result Date: 04/17/2019 CLINICAL DATA:  Shortness of breath. History of lung cancer. Questionable pna on CT EXAM: PORTABLE CHEST 1 VIEW  COMPARISON:  Chest CT 03/24/2019. Lung bases from abdominal CT earlier this day. FINDINGS: Increased volume loss in left hemithorax with left perihilar opacity from prior chest CT. Left pleural effusion. Grossly unchanged heart size and mediastinal contours, left heart border obscured. No pulmonary edema. The consolidation in the medial right lower lobe on CT is not seen radiographically. Right lung is otherwise clear. IMPRESSION: 1. Increased volume loss in the left hemithorax and perihilar density since prior CT. Findings may be due to post treatment related or postobstructive change, patient with known left lung cancer. Left pleural effusion. 2. The right lung base consolidation on abdominal CT earlier today is not visualized radiographically. Electronically Signed   By: Keith Rake M.D.   On: 04/17/2019 01:36    ASSESSMENT AND PLAN: 1.  Stage IV non-small cell lung cancer, adenocarcinoma 2.  Splenic infarct/right renal infarct 3.  History of pulmonary embolism 4.  Recurrent left pleural effusion 5.  Nausea and vomiting  -I have written for the patient to resume Tagrisso 80 mg daily.  She will need to bring in her own supply to take  here at the hospital. -The patient has a new splenic infarct and right renal infarct.  She was previously on Eliquis with no missed doses.  We may need to consider switching her anticoagulation once we resume this after her thoracentesis.  Recommend starting her on Lovenox, but the patient may not be agreeable to this because she does not want to give herself injections.  If she is not agreeable to this, will continue on Eliquis.  This was discussed with Dr. Julien Nordmann. -Agree with thoracentesis.  I discussed with the patient placement of a Pleurx catheter which she does not wish to pursue as she previously had one and did not like managing it at home. -Continue antiemetics as needed.   LOS: 0 days   Mikey Bussing, DNP, AGPCNP-BC, AOCNP 04/17/19

## 2019-04-17 NOTE — Progress Notes (Addendum)
PROGRESS NOTE   Tina Cruz  WJX:[id number]    DOB: 06-Jun-1961    DOA: 04/16/2019  PCP: [name], MD   I have briefly reviewed patients previous medical records in [facility].  No chief complaint on file.   Brief Narrative:  58 year old female with PMH of stage IV non-small cell lung cancer, adenocarcinoma with metastasis to bone, brain, right adrenal gland, malignant left pleural effusion, GERD, hypothyroid, presented with intractable nausea, nonbloody emesis that began on the afternoon of admission, associated with periumbilical abdominal pain which she thinks may be related to her nausea and vomiting, chronic cough and dyspnea but now with productive cough and dyspnea on minimal exertion, inability to keep anything down.  She was admitted for intractable nausea, vomiting, abdominal pain, splenic and right renal infarcts, suspected community-acquired pneumonia versus aspiration pneumonia and recurrent left malignant pleural effusion.  IR and oncology were consulted.  S/p 100 mL ultrasound-guided left thoracentesis.   Assessment & Plan:   Principal Problem:   Pneumonia Active Problems:   GERD (gastroesophageal reflux disease)   Adenocarcinoma of left lung, stage 4 (HCC)   Recurrent left pleural effusion   Bone metastases (HCC)   Brain metastasis (HCC)   Splenic infarct   Lumbar compression fracture (HCC)   Renal infarct (HCC)   Abdominal pain   Nausea & vomiting   Nausea, vomiting and abdominal pain  Subacute to acute onset on day of admission.  Unable to keep oral intake down consistently.  DD: Acute GE versus more likely due to splenic and right renal infarcts.  Treat supportively with liquid diet, advance diet as tolerated, maximize antiemetics, change Pepcid to IV Pepcid and adequate pain control.  If continues to have nausea and vomiting, may have to change to NPO until better before advancing diet.  CT abdomen without findings to suggest  obstruction.  Splenic and right renal infarct  In the context of stage IV lung cancer.  Compliant with home dose of Eliquis which is currently on hold for thoracentesis.  Oncology input appreciated and are considering full dose Lovenox instead of Eliquis if patient agreeable but if not then to continue Eliquis.  As per discussion with IR, okay to resume anticoagulation on 7/7  Follow TTE results to evaluate for valvular lesions.  Lobar pneumonia (RLL), community-acquired versus aspiration versus post radiation changes.    Given productive cough, worsened dyspnea, treating for pneumonia with azithromycin and ceftriaxone.  Postthoracentesis CT chest without contrast reviewed: No pneumothorax.  Persistent pleural collection and thickening at left lung base.  Interval development of focal consolidation right lung base.  More confluent opacity within the left upper lobe.  Recurrent malignant left pleural effusion  Previously had Pleurx drain in place which was removed and now she declines repeat Pleurx catheter placement.  IR consulted and aspirated 100 mL of bloody pleural fluid  Eliquis had been held for procedure.  I discussed with IR who recommended holding anticoagulation for today and resuming on 7/7 if no complications.  Asymptomatic candiduria  Patient denies urinary symptoms.  Discontinued empirically started fluconazole.  L3 compression fracture  Likely pathologic compression fracture from underlying bone mets.    No significant back pain reported.  Ambulate with therapies and monitor.  Stage IV non-small cell lung cancer, adenocarcinoma  Oncology consultation appreciated.  Continue treatment with [place]ris[place], patient will have to bring her home supplies.  Patient reportedly has an appointment with Dr. Isidore Moos, Radiation oncology on 7/7, please contact them in a.m.  I reviewed patient's recent MRI brain 04/03/2019 results with her spouse, 2 intracranial metastasis are  decreasing in size but skull metastasis are enlarging.  History of pulmonary embolism  Home Eliquis held for thoracentesis, may resume anticoagulation on 7/7, oncology discussing Lovenox if patient agreeable.  Prolonged QTC  QTC improved from 497 on admission EKG to 475 on 7/6.  Minimize QT prolonging medications.  Monitor EKG periodically.   DVT prophylaxis: SCDs Code Status: Full Family Communication: Discussed in detail with patient spouse, updated care and answered questions. Disposition: DC home pending clinical improvement.   Consultants:  Medical oncology IR  Procedures:  Ultrasound-guided left thoracentesis by IR 7/6.  Antimicrobials:  IV ceftriaxone and azithromycin   Subjective: When seen earlier this morning, ongoing nausea, some vomiting, unable to consistently take orally, not relieved with current nausea medications.  Periumbilical abdominal pain, nonradiating.  Had normal BM yesterday.  Recent cough with yellow sputum.  Dyspnea with exertion.    Objective:  Vitals:   04/17/19 0031 04/17/19 0433 04/17/19 1056 04/17/19 1138  BP:  130/86 115/65 118/75  Pulse:  (!) 111    Resp:  20    Temp: 97.7 F (36.5 C) 98.2 F (36.8 C)    TempSrc: Oral Oral    SpO2:  96%    Weight:  67.2 kg    Height:  5\' 7"  (1.702 m)      Examination:  General exam: Middle-aged female, moderately built and thinly nourished, lying propped up in bed, uncomfortable with nausea and some painful distress. Respiratory system: Reduced breath sounds in the bases, especially left base with few basal crackles.  Rest of the lung fields sound clear to auscultation without wheezing, rhonchi or crackles. Respiratory effort normal. Cardiovascular system: S1 & S2 heard, RRR. No JVD, murmurs, rubs, gallops or clicks. No pedal edema.  Telemetry personally reviewed: ST in the 100s-110s. Gastrointestinal system: Abdomen is nondistended, soft.  Mild epigastric/periumbilical tenderness without  peritoneal signs. No organomegaly or masses felt. Normal bowel sounds heard. Central nervous system: Alert and oriented. No focal neurological deficits. Extremities: Symmetric 5 x 5 power. Skin: No rashes, lesions or ulcers Psychiatry: Judgement and insight appear normal. Mood & affect appropriate.     Data Reviewed: I have personally reviewed following labs and imaging studies  CBC: Recent Labs  Lab 04/16/19 2103 04/17/19 0448  WBC 10.8* 10.6*  NEUTROABS 9.5*  --   HGB 13.1 12.6  HCT 39.0 37.7  MCV 88.4 89.1  PLT 171 937   Basic Metabolic Panel: Recent Labs  Lab 04/16/19 2103 04/17/19 0448  NA 142 140  K 3.8 3.8  CL 105 105  CO2 22 24  GLUCOSE 128* 134*  BUN 14 14  CREATININE 0.69 0.64  CALCIUM 9.5 9.2  MG  --  2.2   Liver Function Tests: Recent Labs  Lab 04/16/19 2103  AST 41  ALT 16  ALKPHOS 77  BILITOT 0.8  PROT 7.1  ALBUMIN 3.7   Coagulation Profile: Recent Labs  Lab 04/17/19 0448  INR 1.4*   CBG: Recent Labs  Lab 04/17/19 0507  GLUCAP 119*    Recent Results (from the past 240 hour(s))  SARS Coronavirus 2 (CEPHEID - Performed in [facility] lab), Hosp Order     Status: None   Collection Time: 04/17/19 12:22 AM   Specimen: Urine, Clean Catch; Nasopharyngeal  Result Value Ref Range Status   SARS Coronavirus 2 NEGATIVE NEGATIVE Final    Comment: (NOTE) If result is NEGATIVE  SARS-CoV-2 target nucleic acids are NOT DETECTED. The SARS-CoV-2 RNA is generally detectable in upper and lower  respiratory specimens during the acute phase of infection. The lowest  concentration of SARS-CoV-2 viral copies this assay can detect is 250  copies / mL. A negative result does not preclude SARS-CoV-2 infection  and should not be used as the sole basis for treatment or other  patient management decisions.  A negative result may occur with  improper specimen collection / handling, submission of specimen other  than nasopharyngeal swab, presence of  viral mutation(s) within the  areas targeted by this assay, and inadequate number of viral copies  (<250 copies / mL). A negative result must be combined with clinical  observations, patient history, and epidemiological information. If result is POSITIVE SARS-CoV-2 target nucleic acids are DETECTED. The SARS-CoV-2 RNA is generally detectable in upper and lower  respiratory specimens dur ing the acute phase of infection.  Positive  results are indicative of active infection with SARS-CoV-2.  Clinical  correlation with patient history and other diagnostic information is  necessary to determine patient infection status.  Positive results do  not rule out bacterial infection or co-infection with other viruses. If result is PRESUMPTIVE POSTIVE SARS-CoV-2 nucleic acids MAY BE PRESENT.   A presumptive positive result was obtained on the submitted specimen  and confirmed on repeat testing.  While 2019 novel coronavirus  (SARS-CoV-2) nucleic acids may be present in the submitted sample  additional confirmatory testing may be necessary for epidemiological  and / or clinical management purposes  to differentiate between  SARS-CoV-2 and other Sarbecovirus currently known to infect humans.  If clinically indicated additional testing with an alternate test  methodology [phone]) is advised. The SARS-CoV-2 RNA is generally  detectable in upper and lower respiratory sp ecimens during the acute  phase of infection. The expected result is Negative. Fact Sheet for Patients:  StrictlyIdeas.no Fact Sheet for Healthcare Providers: BankingDealers.co.za This test is not yet approved or cleared by the [place] FDA and has been authorized for detection and/or diagnosis of SARS-CoV-2 by FDA under an Emergency Use Authorization (EUA).  This EUA will remain in effect (meaning this test can be used) for the duration of the COVID-19 declaration under Section  564(b)(1) of the Act, 21 U.S.C. section 360bbb-3(b)(1), unless the authorization is terminated or revoked sooner. Performed at [facility], [place] [address] [place], [place] [address]   MRSA PCR Screening     Status: None   Collection Time: 04/17/19  4:39 AM   Specimen: Nasal Mucosa; Nasopharyngeal  Result Value Ref Range Status   MRSA by PCR NEGATIVE NEGATIVE Final    Comment:        The GeneXpert MRSA Assay (FDA approved for NASAL specimens only), is one component of a comprehensive MRSA colonization surveillance program. It is not intended to diagnose MRSA infection nor to guide or monitor treatment for MRSA infections. Performed at [facility], [place] [address] [place], [place] [address]   Body fluid culture     Status: None (Preliminary result)   Collection Time: 04/17/19 12:03 PM   Specimen: Pleural, Left; Pleural Fluid  Result Value Ref Range Status   Specimen Description   Final    PLEURAL Performed at [facility], [place] [address], [place], [place] [address]    Special Requests   Final    NONE Performed at [facility], [place] [address], [place],  [address]  Gram Stain   Final    RARE WBC PRESENT, PREDOMINANTLY MONONUCLEAR NO ORGANISMS SEEN Performed at [facility] [address] [place], [place] [address]    Culture PENDING  Incomplete   Report Status PENDING  Incomplete         Radiology Studies: Ct Chest Wo Contrast  Result Date: 04/17/2019 CLINICAL DATA:  Pt s/p thoracentesis. "medical history significant of metastatic non-small cell lung cancer, with metastasis to bone, brain, right adrenal gland and malignant left pleural effusion presents with persistent nausea and vomiting since 3 a.m. today. Periumbilical pain without radiation. Chronic cough and shortness of breath, similar to baseline. EXAM: CT CHEST WITHOUT CONTRAST TECHNIQUE: Multidetector CT imaging of the chest was  performed following the standard protocol without IV contrast. COMPARISON:  CT of the chest on 03/24/2019 FINDINGS: Cardiovascular: Heart size is normal. There is atherosclerotic calcification of the coronary arteries. Normal noncontrast appearance of the pulmonary arteries. There is minimal atherosclerosis of the thoracic aorta, not associated with aneurysm. Mediastinum/Nodes: The visualized portion of the thyroid gland has a normal appearance. Esophagus is normal in appearance. Enlarged LEFT supraclavicular lymph nodes, largest 1.4 centimeters. Previously, largest was 1.0 centimeters. Small mediastinal and hilar lymph nodes, none of which reach criteria for enlargement. Lungs/Pleura: There is no pneumothorax following thoracentesis. Persistent pleural collection identified posteriorly at the LEFT lung base. Although the study is not performed with intravenous contrast, suspect pleural thickening particularly at the LEFT lung base. Focal consolidations/mass in the LEFT lung apex is 1.6 x 2.2 centimeters, previously 2.3 x 1.8 centimeters. Extensive consolidations seen throughout the LEFT UPPER lobe with a extensive air bronchograms. Ground-glass opacities are identified in the LEFT LOWER lobe. The LEFT UPPER lobe opacities are more confluent when compared with the prior study. There is focal consolidations/atelectasis at the MEDIAL RIGHT lung base, associated with air bronchograms and new since the prior study. The RIGHT lung is hyperinflated. Upper Abdomen: Contrast identified within the gallbladder and collecting systems of the kidneys. Colonic diverticulosis is noted. The adrenal glands are normal in appearance. Musculoskeletal: Numerous blastic lesions within the thoracic and UPPER lumbar spine, ribs, and LEFT scapula. Stable compression fracture of T12. IMPRESSION: 1. No pneumothorax following thoracentesis. 2. Persistent pleural collection and pleural thickening at the LEFT lung base. 3. Interval development  of focal consolidation RIGHT lung base. 4. More confluent opacity within the LEFT UPPER lobe. 5. Interval enlargement of LEFT supraclavicular lymph nodes, suspicious for metastatic disease. 6. Stable compression fracture of T12. Sclerotic osseous metastases. 7. Coronary artery disease. 8. Colonic diverticulosis. 9.  Aortic atherosclerosis.  (ICD10-I70.0) Electronically Signed   By: Nolon Nations M.D.   On: 04/17/2019 12:31   Ct Abdomen Pelvis W Contrast  Result Date: 04/16/2019 CLINICAL DATA:  Acute abdominal pain. Nausea and vomiting. Neutropenia. EXAM: CT ABDOMEN AND PELVIS WITH CONTRAST TECHNIQUE: Multidetector CT imaging of the abdomen and pelvis was performed using the standard protocol following bolus administration of intravenous contrast. CONTRAST:  12mL OMNIPAQUE IOHEXOL 300 MG/ML  SOLN COMPARISON:  Most recent imaging CT 03/24/2019, PET CT 12/12/2018 FINDINGS: Lower chest: Left pleural effusion with enhancement, similar to slightly decreased from prior exam. Unchanged compressive atelectasis and ground-glass opacities throughout the left lung base. New linear consolidation with air bronchograms in the medial right lower lobe. Hepatobiliary: Hepatic steatosis. No focal lesion. Gallbladder physiologically distended, no calcified stone. No biliary dilatation. Pancreas: 17 mm low-density lesion in the pancreatic neck with central quest occasion, unchanged parenchymal atrophy. No ductal dilatation  or inflammation. Spleen: Multiple wedge-shaped low-density defects primarily in the mid lower spleen consistent with infarcts. No perisplenic fluid. Adrenals/Urinary Tract: No adrenal nodule. No hydronephrosis or perinephric edema. Small slightly wedge-shaped defect of the superior right kidney, best appreciated on sagittal image 57 series 6, not present on prior exam. Symmetric excretion on delayed phase imaging. Urinary bladder is physiologically distended without wall thickening. Stomach/Bowel: Small hiatal  hernia. Stomach physiologically distended. No bowel wall thickening, inflammatory change, or obstruction. Mild colonic diverticulosis without diverticulitis. Normal appendix. Vascular/Lymphatic: Calcified aortic atherosclerosis. No aneurysm. Patent portal vein and mesenteric vessels. No enlarged lymph nodes in the abdomen or pelvis. Reproductive: Uterus and bilateral adnexa are unremarkable. Other: No ascites. No free air or intra-abdominal fluid collection. Musculoskeletal: Known multifocal sclerotic metastatic disease which appears similar to prior exam. L4 and T12 compression fractures are unchanged from prior. Mild central depression of superior L3 may represent interval mild pathologic compression fracture. IMPRESSION: 1. Multiple splenic infarcts, new from prior exam. 2. Small wedge-shaped defect in the superior right kidney, new from prior exam. Given splenic infarcts, small infarct is favored over pyelonephritis. 3. Unchanged cystic lesion in the pancreatic neck with internal calcification. 4. Mild central depression of superior L3 is new and may reflect interval mild pathologic compression fracture. Unchanged multifocal sclerotic osseous metastatic disease. Mild L4 and T12 compression fractures are unchanged. 5. Linear consolidation in the medial right lower lobe, not seen previously, this may represent postradiation change versus pneumonia. 6. Chronic partially loculated left pleural effusion. 7. Colonic diverticulosis without diverticulitis. Aortic Atherosclerosis (ICD10-I70.0). Electronically Signed   By: Keith Rake M.D.   On: 04/16/2019 23:52   Dg Chest Portable 1 View  Result Date: 04/17/2019 CLINICAL DATA:  Shortness of breath. History of lung cancer. Questionable pna on CT EXAM: PORTABLE CHEST 1 VIEW COMPARISON:  Chest CT 03/24/2019. Lung bases from abdominal CT earlier this day. FINDINGS: Increased volume loss in left hemithorax with left perihilar opacity from prior chest CT. Left pleural  effusion. Grossly unchanged heart size and mediastinal contours, left heart border obscured. No pulmonary edema. The consolidation in the medial right lower lobe on CT is not seen radiographically. Right lung is otherwise clear. IMPRESSION: 1. Increased volume loss in the left hemithorax and perihilar density since prior CT. Findings may be due to post treatment related or postobstructive change, patient with known left lung cancer. Left pleural effusion. 2. The right lung base consolidation on abdominal CT earlier today is not visualized radiographically. Electronically Signed   By: Keith Rake M.D.   On: 04/17/2019 01:36   US Thoracentesis Asp Pleural Space W/img Guide  Result Date: 04/17/2019 INDICATION: Patient with history of metastatic lung cancer, recurrent malignant left pleural effusion; request made for diagnostic and therapeutic left thoracentesis. EXAM: ULTRASOUND GUIDED DIAGNOSTIC AND THERAPEUTIC LEFT THORACENTESIS MEDICATIONS: None COMPLICATIONS: None immediate. PROCEDURE: An ultrasound guided thoracentesis was thoroughly discussed with the patient and questions answered. The benefits, risks, alternatives and complications were also discussed. The patient understands and wishes to proceed with the procedure. Written consent was obtained. Ultrasound was performed to localize and mark an adequate pocket of fluid in the left chest. The area was then prepped and draped in the normal sterile fashion. 1% Lidocaine was used for local anesthesia. Under ultrasound guidance a 6 Fr Safe-T-Centesis catheter was introduced. Thoracentesis was performed. The catheter was removed and a dressing applied. FINDINGS: A total of approximately 100 cc of amber/blood-tinged fluid was removed. Samples were sent to the laboratory as requested  by the clinical team. The amount of free pleural fluid was small on today's ultrasound and multiloculated. IMPRESSION: Successful ultrasound guided diagnostic and therapeutic left  thoracentesis yielding 100 cc of pleural fluid. Read by: Rowe Robert, PA-C Electronically Signed   By: Jerilynn Mages.  Shick M.D.   On: 04/17/2019 11:56        Scheduled Meds:  feeding supplement  1 Container Oral TID BM   levothyroxine  112 mcg Oral Q0600   lidocaine       loratadine  10 mg Oral Daily   montelukast  10 mg Oral QHS   osimertinib mesylate  80 mg Oral Daily   pantoprazole (PROTONIX) IV  40 mg Intravenous Q24H   Continuous Infusions:  azithromycin 500 mg (04/17/19 0611)   cefTRIAXone (ROCEPHIN)  IV 2 g (04/17/19 0529)   sodium chloride 0.9 % 1,000 mL with potassium chloride 40 mEq infusion 75 mL/hr at 04/17/19 1007     LOS: 0 days     Vernell Leep, MD, FACP, [facility]. [facility]  To contact the attending provider between 7A-7P or the covering provider during after hours 7P-7A, please log into the web site [email or web address] and access using universal [place] password for that web site. If you do not have the password, please call the hospital operator.  04/17/2019, 3:05 PM

## 2019-04-17 NOTE — Evaluation (Signed)
Physical Therapy Evaluation Patient Details Name: Tina Cruz MRN: [id number] DOB: Dec 28, 1960 Today's Date: 04/17/2019   History of Present Illness  58 yo female admitted to ED on 7/5 with abdominal pain, N/V, with diagnosis of PNA. Pt with stage IV lung cancer with mets to brain and bone, CT abdomen reveals multiple splenic infarcts, L3-L4 and T12 compression fractures, L pleural effusion. PMH includes depression, PE, GERD.  Clinical Impression   Pt presents with back pain, generalized weakness, increased time and effort to perform mobility tasks, dyspnea on exertion, and decreased activity tolerance. Pt to benefit from acute PT to address deficits. Pt ambulated short hallway distance with RW with additional support, pt feeling weak and tired post-thoracentesis. Pt with O2sats at 91% post-ambulation on RA, pt with increased work of breathing on exertion. Pt reports to PT that she has a good understanding of energy conservation at home, and knows when to take easier days when she feels fatigued.  PT recommending HHPT to address deficits post-acutely. PT to progress mobility as tolerated, and will continue to follow acutely.      Follow Up Recommendations Home health PT;Supervision for mobility/OOB(TBD)    Equipment Recommendations  Other (comment)(TD)    Recommendations for Other Services       Precautions / Restrictions Precautions Precautions: Fall Restrictions Weight Bearing Restrictions: No      Mobility  Bed Mobility Overal bed mobility: Needs Assistance Bed Mobility: Supine to Sit     Supine to sit: Supervision;HOB elevated     General bed mobility comments: Supervision for safety, increased time and effort to perform with use of bedrails.  Transfers Overall transfer level: Needs assistance Equipment used: Rolling walker (2 wheeled) Transfers: Sit to/from Stand Sit to Stand: Min assist;From elevated surface         General transfer comment: Min assist for  steadying, verbal cuing for hand placement.  Ambulation/Gait Ambulation/Gait assistance: Min guard Gait Distance (Feet): 30 Feet Assistive device: Rolling walker (2 wheeled) Gait Pattern/deviations: Step-through pattern;Trunk flexed Gait velocity: decr   General Gait Details: Min guard for safety, verbal cuing for placement in RW and upright posture. Pt with dyspnea on exertion 2/4, sats 91% on RA immediately post-ambulation.  Stairs            Wheelchair Mobility    Modified Rankin (Stroke Patients Only)       Balance Overall balance assessment: Needs assistance Sitting-balance support: Feet supported Sitting balance-Leahy Scale: Good     Standing balance support: No upper extremity supported Standing balance-Leahy Scale: Fair                               Pertinent Vitals/Pain Pain Assessment: Faces Faces Pain Scale: Hurts little more Pain Location: back, from thoracentesis site Pain Descriptors / Indicators: Sore Pain Intervention(s): Limited activity within patient's tolerance;Monitored during session;Premedicated before session;Ice applied;RN gave pain meds during session    [place] expects to be discharged to:: Private residence Living Arrangements: Spouse/significant other Available Help at Discharge: Family;Available PRN/intermittently Type of Home: House Home Access: Stairs to enter Entrance Stairs-Rails: Psychiatric nurse of Steps: 5 Home Layout: One level Home Equipment: None      Prior Function Level of Independence: Independent         Comments: Pt reports pt and husband are discussing possibly getting tub bench. Pt reports she can do everything for self, but requires increased time. Pt states husband works 9-5  Hand Dominance   Dominant Hand: Right    Extremity/Trunk Assessment   Upper Extremity Assessment Upper Extremity Assessment: Overall WFL for tasks assessed    Lower  Extremity Assessment Lower Extremity Assessment: Generalized weakness    Cervical / Trunk Assessment Cervical / Trunk Assessment: Kyphotic  Communication   Communication: No difficulties  Cognition Arousal/Alertness: Awake/alert Behavior During Therapy: WFL for tasks assessed/performed Overall Cognitive Status: Within Functional Limits for tasks assessed                                 General Comments: Pt drowsy post-thoracentesis      General Comments General comments (skin integrity, edema, etc.): HR to 123 bpm, SpO2 91% immediately post-ambulation    Exercises     Assessment/Plan    PT Assessment Patient needs continued PT services  PT Problem List Decreased strength;Decreased mobility;Decreased activity tolerance;Decreased knowledge of use of DME;Pain       PT Treatment Interventions DME instruction;Therapeutic activities;Gait training;Patient/family education;Balance training;Stair training;Therapeutic exercise;Functional mobility training    PT Goals (Current goals can be found in the Care Plan section)  Acute Rehab PT Goals Patient Stated Goal: feel better PT Goal Formulation: With patient Time For Goal Achievement: 05/01/19 Potential to Achieve Goals: Good    Frequency Min 3X/week   Barriers to discharge        Co-evaluation               AM-PAC PT "6 Clicks" Mobility  Outcome Measure Help needed turning from your back to your side while in a flat bed without using bedrails?: None Help needed moving from lying on your back to sitting on the side of a flat bed without using bedrails?: A Little Help needed moving to and from a bed to a chair (including a wheelchair)?: A Little Help needed standing up from a chair using your arms (e.g., wheelchair or bedside chair)?: A Little Help needed to walk in hospital room?: A Little Help needed climbing 3-5 steps with a railing? : A Lot 6 Click Score: 18    End of Session Equipment Utilized  During Treatment: Gait belt Activity Tolerance: Patient limited by fatigue;Patient limited by pain Patient left: in chair;with call bell/phone within reach;with chair alarm set Nurse Communication: Mobility status PT Visit Diagnosis: Other abnormalities of gait and mobility (R26.89);Muscle weakness (generalized) (M62.81)    Time: 1610-9604 PT Time Calculation (min) (ACUTE ONLY): 23 min   Charges:   PT Evaluation $PT Eval Low Complexity: 1 Low PT Treatments $Gait Training: 8-22 mins        Julien Girt, PT Acute Rehabilitation Services Pager [phone]  Office [place] 04/17/2019, 5:04 PM

## 2019-04-17 NOTE — H&P (Signed)
History and Physical    Tina Cruz XTG:[id number] DOB: 1961-05-04 DOA: 04/16/2019  PCP: [name], MD  Patient coming from: Home  I have personally briefly reviewed patient's old medical records in [facility]  Chief Complaint: Nausea/vomiting; weakness  HPI: Tina Cruz is a 58 y.o. female with medical history significant of metastatic non-small cell lung cancer, with metastasis to bone, brain, right adrenal gland and malignant left pleural effusion presents with persistent nausea and vomiting since 3 AM this morning.  Patient also reports periumbilical pain without any radiation.  She also endorses a chronic cough and shortness of breath which has not much different than her normal baseline.  She does not leave her home unless is for physicians appointments, reports no sick contacts and no changes to her home medication regimen.  She does endorse some chronic constipation in which she took some magnesium citrate this morning with resolution.  She is currently unable to keep anything down to include her home medications.  She denies any fever/chills/night sweats, no diarrhea, no changes in dietary habits.  No other complaints at this time.  ED Course: Temperature 97.9, HR 107, RR 18, BP 138/83, SPO2 96% on room air.  WBC count 10.8, hemoglobin 13.1, platelets 171.  Sodium 143, potassium 3.8, chloride 105, CO2 22, BUN 14, creatinine 0.69, glucose 128.  hCG less than 5.0.  Lactic acid 1.4.  Urinalysis with trace leukoesterase, negative nitrite, no bacteria, 0-5 WBCs, positive budding yeast.  CT abdomen/pelvis with multiple splenic infarcts, small wedge-shaped defect superior right kidney, unchanged cyst lesion pancreatic neck, depression of L3 compression fracture and noted multifocal sclerotic osseous metastases, partially loculated left pleural effusion and right lower lobe consolidation.  Patient was given cefepime and Flagyl in ED for presumed intra-abdominal infection.   Patient referred for admission for likely sepsis with associated tachycardia and elevated WBC count in the setting of intractable nausea/vomiting.  Review of Systems:   Past Medical History:  Diagnosis Date   Active smoker    quit 2016   Cancer [facility])    left lung stage IV non-cell carcinoma/adenocarcinoma with diagnosis made from left pleural effusion.     Cancer ([place])    Brain metastasis    Constipation    Depression    Dyspnea    GERD (gastroesophageal reflux disease)    H/O chronic bronchitis    Hypothyroidism    Seasonal allergies    SVD (spontaneous vaginal delivery)    x 2   Thyroid disease    hypothyroid   Wears partial dentures     Past Surgical History:  Procedure Laterality Date   COLONOSCOPY     x 2   IR PERC PLEURAL DRAIN W/INDWELL CATH W/IMG GUIDE  01/06/2019   IR REMOVAL OF PLURAL CATH W/CUFF  02/23/2019   TONSILLECTOMY     TUBAL LIGATION       reports that she quit smoking about 3 years ago. Her smoking use included cigarettes. She has a 2.50 pack-year smoking history. She has never used smokeless tobacco. She reports that she does not drink alcohol or use drugs.  Allergies  Allergen Reactions   Albuterol Hives and Itching    Family History  Problem Relation Age of Onset   Cancer Mother        colon   Alcohol abuse Father     Prior to Admission medications   Medication Sig Start Date End Date Taking? Authorizing Provider  acetaminophen (TYLENOL) 325 MG tablet Take  325 mg by mouth every 6 (six) hours as needed for mild pain or headache.   Yes [provider]  azelastine (ASTELIN) 0.1 % nasal spray Place 1 spray into both nostrils 2 (two) times daily as needed for rhinitis. Use in each nostril as directed Patient taking differently: Place 1 spray into both nostrils 2 (two) times daily as needed for rhinitis.  11/02/18  Yes Delsa Grana, PA-C  Cetirizine HCl (ZYRTEC ALLERGY) 10 MG CAPS Take 1 capsule (10 mg total) by mouth  daily as needed (allergies). Patient taking differently: Take 1 capsule by mouth daily.  07/27/18  Yes Dixon, Mary B, PA-C  ELIQUIS 5 MG TABS tablet TAKE 1 TABLET BY MOUTH TWICE A DAY Patient taking differently: Take 5 mg by mouth 2 (two) times daily.  03/24/19  Yes [place], Modena Nunnery, MD  famotidine (PEPCID) 20 MG tablet Take 1 tablet (20 mg total) by mouth daily. 01/30/19  Yes Eugenie Filler, MD  levalbuterol [facility] HFA) 45 MCG/ACT inhaler Inhale 1-2 puffs into the lungs every 4 (four) hours as needed for wheezing or shortness of breath. 02/13/19  Yes [place], Modena Nunnery, MD  levothyroxine (SYNTHROID) 112 MCG tablet Take 1 tablet (112 mcg total) by mouth daily before breakfast. 01/30/19  Yes [place], Modena Nunnery, MD  magnesium citrate SOLN Take 0.5 Bottles by mouth daily as needed for moderate constipation or severe constipation.   Yes [provider]  montelukast (SINGULAIR) 10 MG tablet Take 10 mg by mouth at bedtime. 02/16/19  Yes [provider]  polyethylene glycol (MIRALAX / GLYCOLAX) packet Take 17 g by mouth daily.    Yes [provider]  TAGRISSO 80 MG tablet TAKE 1 TABLET DAILY Patient taking differently: Take 80 mg by mouth daily.  03/29/19  Yes [name], MD  benzonatate (TESSALON) 100 MG capsule Take 1 capsule (100 mg total) by mouth 3 (three) times daily as needed for cough. Patient not taking: Reported on 04/16/2019 02/13/19   [name], MD  cephALEXin (KEFLEX) 500 MG capsule Take 1 capsule (500 mg total) by mouth 3 (three) times daily. Patient not taking: Reported on 04/16/2019 02/13/19   [name], MD  sucralfate (CARAFATE) 1 GM/10ML suspension Take 10 mLs (1 g total) by mouth 4 (four) times daily -  with meals and at bedtime. Patient not taking: Reported on 02/13/2019 01/10/19   Gery Pray, MD    Physical Exam: Vitals:   04/16/19 2247 04/17/19 0028 04/17/19 0030 04/17/19 0031  BP: 138/83  138/67   Pulse: (!) 107  (!) 108   Resp: 18  (!)  26   Temp:    97.7 F (36.5 C)  TempSrc:    Oral  SpO2: 96%  95%   Weight:  68 kg    Height:  5\' 7"  (1.702 m)      Constitutional: NAD, calm, comfortable Vitals:   04/16/19 2247 04/17/19 0028 04/17/19 0030 04/17/19 0031  BP: 138/83  138/67   Pulse: (!) 107  (!) 108   Resp: 18  (!) 26   Temp:    97.7 F (36.5 C)  TempSrc:    Oral  SpO2: 96%  95%   Weight:  68 kg    Height:  5\' 7"  (1.702 m)     Eyes: PERRL, lids and conjunctivae normal ENMT: Mucous membranes are dry. Posterior pharynx clear of any exudate or lesions.Normal dentition.  Neck: normal, supple, no masses, no thyromegaly Respiratory: Breath sounds slightly decreased bilateral  bases with mild crackles, no wheezing, normal respiratory effort, on room air Cardiovascular: Regular rate and rhythm, no murmurs / rubs / gallops. No extremity edema. 2+ pedal pulses. No carotid bruits.  Abdomen: Mild periumbilical and left upper quadrant tenderness, no rebound/guarding/masses no tenderness, no masses palpated. No hepatosplenomegaly. Bowel sounds positive.  Musculoskeletal: no clubbing / cyanosis. No joint deformity upper and lower extremities. Good ROM, no contractures. Normal muscle tone.  Skin: no rashes, lesions, ulcers. No induration Neurologic: CN 2-12 grossly intact. Sensation intact, DTR normal. Strength 5/5 in all 4.  Psychiatric: Normal judgment and insight. Alert and oriented x 3. Normal mood.    Labs on Admission: I have personally reviewed following labs and imaging studies  CBC: Recent Labs  Lab 04/16/19 2103  WBC 10.8*  NEUTROABS 9.5*  HGB 13.1  HCT 39.0  MCV 88.4  PLT 810   Basic Metabolic Panel: Recent Labs  Lab 04/16/19 2103  NA 142  K 3.8  CL 105  CO2 22  GLUCOSE 128*  BUN 14  CREATININE 0.69  CALCIUM 9.5   GFR: Estimated Creatinine Clearance: 75.4 mL/min (by C-G formula based on SCr of 0.69 mg/dL). Liver Function Tests: Recent Labs  Lab 04/16/19 2103  AST 41  ALT 16  ALKPHOS 77    BILITOT 0.8  PROT 7.1  ALBUMIN 3.7   Recent Labs  Lab 04/16/19 2103  LIPASE 31   No results for input(s): AMMONIA in the last 168 hours. Coagulation Profile: No results for input(s): INR, PROTIME in the last 168 hours. Cardiac Enzymes: No results for input(s): CKTOTAL, CKMB, CKMBINDEX, TROPONINI in the last 168 hours. BNP (last 3 results) No results for input(s): PROBNP in the last 8760 hours. HbA1C: No results for input(s): HGBA1C in the last 72 hours. CBG: No results for input(s): GLUCAP in the last 168 hours. Lipid Profile: No results for input(s): CHOL, HDL, LDLCALC, TRIG, CHOLHDL, LDLDIRECT in the last 72 hours. Thyroid Function Tests: No results for input(s): TSH, T4TOTAL, FREET4, T3FREE, THYROIDAB in the last 72 hours. Anemia Panel: No results for input(s): VITAMINB12, FOLATE, FERRITIN, TIBC, IRON, RETICCTPCT in the last 72 hours. Urine analysis:    Component Value Date/Time   COLORURINE YELLOW 04/16/2019 2103   APPEARANCEUR HAZY (A) 04/16/2019 2103   LABSPEC 1.017 04/16/2019 2103   PHURINE 7.0 04/16/2019 2103   GLUCOSEU NEGATIVE 04/16/2019 2103   HGBUR MODERATE (A) 04/16/2019 2103   BILIRUBINUR NEGATIVE 04/16/2019 2103   KETONESUR 80 (A) 04/16/2019 2103   PROTEINUR 30 (A) 04/16/2019 2103   NITRITE NEGATIVE 04/16/2019 2103   LEUKOCYTESUR TRACE (A) 04/16/2019 2103    Radiological Exams on Admission: Ct Abdomen Pelvis W Contrast  Result Date: 04/16/2019 CLINICAL DATA:  Acute abdominal pain. Nausea and vomiting. Neutropenia. EXAM: CT ABDOMEN AND PELVIS WITH CONTRAST TECHNIQUE: Multidetector CT imaging of the abdomen and pelvis was performed using the standard protocol following bolus administration of intravenous contrast. CONTRAST:  124mL OMNIPAQUE IOHEXOL 300 MG/ML  SOLN COMPARISON:  Most recent imaging CT 03/24/2019, PET CT 12/12/2018 FINDINGS: Lower chest: Left pleural effusion with enhancement, similar to slightly decreased from prior exam. Unchanged compressive  atelectasis and ground-glass opacities throughout the left lung base. New linear consolidation with air bronchograms in the medial right lower lobe. Hepatobiliary: Hepatic steatosis. No focal lesion. Gallbladder physiologically distended, no calcified stone. No biliary dilatation. Pancreas: 17 mm low-density lesion in the pancreatic neck with central quest occasion, unchanged parenchymal atrophy. No ductal dilatation or inflammation. Spleen: Multiple wedge-shaped low-density defects  primarily in the mid lower spleen consistent with infarcts. No perisplenic fluid. Adrenals/Urinary Tract: No adrenal nodule. No hydronephrosis or perinephric edema. Small slightly wedge-shaped defect of the superior right kidney, best appreciated on sagittal image 57 series 6, not present on prior exam. Symmetric excretion on delayed phase imaging. Urinary bladder is physiologically distended without wall thickening. Stomach/Bowel: Small hiatal hernia. Stomach physiologically distended. No bowel wall thickening, inflammatory change, or obstruction. Mild colonic diverticulosis without diverticulitis. Normal appendix. Vascular/Lymphatic: Calcified aortic atherosclerosis. No aneurysm. Patent portal vein and mesenteric vessels. No enlarged lymph nodes in the abdomen or pelvis. Reproductive: Uterus and bilateral adnexa are unremarkable. Other: No ascites. No free air or intra-abdominal fluid collection. Musculoskeletal: Known multifocal sclerotic metastatic disease which appears similar to prior exam. L4 and T12 compression fractures are unchanged from prior. Mild central depression of superior L3 may represent interval mild pathologic compression fracture. IMPRESSION: 1. Multiple splenic infarcts, new from prior exam. 2. Small wedge-shaped defect in the superior right kidney, new from prior exam. Given splenic infarcts, small infarct is favored over pyelonephritis. 3. Unchanged cystic lesion in the pancreatic neck with internal  calcification. 4. Mild central depression of superior L3 is new and may reflect interval mild pathologic compression fracture. Unchanged multifocal sclerotic osseous metastatic disease. Mild L4 and T12 compression fractures are unchanged. 5. Linear consolidation in the medial right lower lobe, not seen previously, this may represent postradiation change versus pneumonia. 6. Chronic partially loculated left pleural effusion. 7. Colonic diverticulosis without diverticulitis. Aortic Atherosclerosis (ICD10-I70.0). Electronically Signed   By: Keith Rake M.D.   On: 04/16/2019 23:52   Dg Chest Portable 1 View  Result Date: 04/17/2019 CLINICAL DATA:  Shortness of breath. History of lung cancer. Questionable pna on CT EXAM: PORTABLE CHEST 1 VIEW COMPARISON:  Chest CT 03/24/2019. Lung bases from abdominal CT earlier this day. FINDINGS: Increased volume loss in left hemithorax with left perihilar opacity from prior chest CT. Left pleural effusion. Grossly unchanged heart size and mediastinal contours, left heart border obscured. No pulmonary edema. The consolidation in the medial right lower lobe on CT is not seen radiographically. Right lung is otherwise clear. IMPRESSION: 1. Increased volume loss in the left hemithorax and perihilar density since prior CT. Findings may be due to post treatment related or postobstructive change, patient with known left lung cancer. Left pleural effusion. 2. The right lung base consolidation on abdominal CT earlier today is not visualized radiographically. Electronically Signed   By: Keith Rake M.D.   On: 04/17/2019 01:36    EKG: Independently reviewed.  Sinus tachycardia, rate 109, QRS 84, QTc 497.  No concerning ST elevation/depressions or T wave inversions.  Assessment/Plan Principal Problem:   Pneumonia Active Problems:   GERD (gastroesophageal reflux disease)   Adenocarcinoma of left lung, stage 4 (HCC)   Recurrent left pleural effusion   Bone metastases (HCC)    Brain metastasis (HCC)   Splenic infarct   Lumbar compression fracture (HCC)   Renal infarct (HCC)   Abdominal pain   Nausea & vomiting   Abdominal pain associated with nausea/vomiting Splenic infarct Right renal infarct Patient presenting with progressive nausea/vomiting with associated abdominal pain.  CT abdomen/pelvis notable for multiple splenic infarcts, small wedge-shaped defect superior [place] consistent with renal infarct, L3 compression fracture, partially loculated left pleural effusion and right lower lobe consolidation.  No bowel obstruction appreciated, no free air or intra-abdominal fluid collection. --Etiology of her splenic/renal infarct likely secondary to her metastatic disease process; unlikely infectious --On chronic  anticoagulation outpatient for history of PE; will hold for possible thoracentesis --check transthoracic echocardiogram to evaluate for valvular lesions --Clear liquid diet, advance as tolerated --morphine IV for pain control --Supportive care, antiemetics  Community-acquired pneumonia CT abdomen/pelvis notable for right lower lobe consolidation, consistent with pneumonia versus postradiation change.  WBC count elevated 10.8.  Afebrile. --Blood cultures x2: Pending --Check urine Legionella/strep pneumo antigen --Check CT chest without contrast for further evaluation --Start azithromycin and ceftriaxone  Left pleural effusion; recurrent Patient with history of malignant left pleural effusion, previously had Pleurx drain in place which has now been discontinued.  Chest x-ray notable for increased volume loss in the left hemithorax with perihilar opacity.  Concern for recurrent and worsening left pleural effusion.  Patient has declined repeat Pleurx catheter placement but is okay with thoracentesis. --Hold Eliquis --IR consultation placed for consideration of left-sided thoracentesis  Yeast UTI Urinalysis with trace leukoesterase, negative nitrite, no  bacteria, 0-5 WBCs and positive for budding yeast. --Starting fluconazole --Follow-up urine culture  L3 compression fracture Etiology likely pathologic compression fracture from underlying bony mets.  History of L4/T3 compression fractures, with likely mild depression of L3. --PT/OT evaluation --If significant pain on ambulation and with mobility, can consider IR evaluation for kyphoplasty  Hx metastatic non-small cell lung cancer with metastasis to bone/brain/right adrenal gland Follows with medical oncology, Dr. Earlie Server.  Brain on 04/03/2019 decreased size of the 2 treated brain metastases and no evidence of new brain mets; but did show interval enlargement of multiple skull metastases. --Continue treatment with Tagrisso --check CT chest w/o contrast, if persistent Left pleural effusion, may need IT  Hx pulmonary embolism On Eliquis outpatient for anticoagulation. --We will hold Eliquis for now for possible need of thoracentesis  DVT prophylaxis: SCD's, Eliquis on hold for possible thoracentesis Code Status: Full code Family Communication: None Disposition Plan: Likely home with husband when medically stable Consults called: None Admission status: Inpatient   Salomon Ganser J [place] DO [facility] Pager [phone]  If 7PM-7AM, please contact night-coverage [email or web address] Password [email or web address]  04/17/2019, 2:02 AM

## 2019-04-18 ENCOUNTER — Ambulatory Visit
Admission: RE | Admit: 2019-04-18 | Discharge: 2019-04-18 | Disposition: A | Discharge status: Home / Self Care | Payer: Managed Care, Other (non HMO) | Source: Ambulatory Visit | Attending: Radiation Oncology | Admitting: Radiation Oncology

## 2019-04-18 ENCOUNTER — Inpatient Hospital Stay (HOSPITAL_COMMUNITY): Payer: Managed Care, Other (non HMO)

## 2019-04-18 ENCOUNTER — Telehealth: Payer: Self-pay | Admitting: Radiation Therapy

## 2019-04-18 ENCOUNTER — Encounter: Payer: Self-pay | Admitting: Radiation Oncology

## 2019-04-18 DIAGNOSIS — J181 Lobar pneumonia, unspecified organism: Secondary | ICD-10-CM

## 2019-04-18 DIAGNOSIS — I2699 Other pulmonary embolism without acute cor pulmonale: Secondary | ICD-10-CM

## 2019-04-18 DIAGNOSIS — J90 Pleural effusion, not elsewhere classified: Secondary | ICD-10-CM

## 2019-04-18 LAB — CBC
HCT: 35.4 % — ABNORMAL LOW (ref 36.0–46.0)
Hemoglobin: 11.6 g/dL — ABNORMAL LOW (ref 12.0–15.0)
MCH: 30.1 pg (ref 26.0–34.0)
MCHC: 32.8 g/dL (ref 30.0–36.0)
MCV: 91.7 fL (ref 80.0–100.0)
Platelets: 122 10*3/uL — ABNORMAL LOW (ref 150–400)
RBC: 3.86 MIL/uL — ABNORMAL LOW (ref 3.87–5.11)
RDW: 12.4 % (ref 11.5–15.5)
WBC: 11.8 10*3/uL — ABNORMAL HIGH (ref 4.0–10.5)
nRBC: 0 % (ref 0.0–0.2)

## 2019-04-18 LAB — BASIC METABOLIC PANEL
Anion gap: 9 (ref 5–15)
BUN: 11 mg/dL (ref 6–20)
CO2: 22 mmol/L (ref 22–32)
Calcium: 8.8 mg/dL — ABNORMAL LOW (ref 8.9–10.3)
Chloride: 106 mmol/L (ref 98–111)
Creatinine, Ser: 0.62 mg/dL (ref 0.44–1.00)
GFR calc Af Amer: >60 mL/min (ref >60)
GFR calc non Af Amer: >60 mL/min (ref >60)
Glucose, Bld: 118 mg/dL — ABNORMAL HIGH (ref 70–99)
Potassium: 4.4 mmol/L (ref 3.5–5.1)
Sodium: 137 mmol/L (ref 135–145)

## 2019-04-18 LAB — URINE CULTURE: Culture: NO GROWTH

## 2019-04-18 LAB — EXPECTORATED SPUTUM ASSESSMENT W GRAM STAIN, RFLX TO RESP C

## 2019-04-18 LAB — LEGIONELLA PNEUMOPHILA SEROGP 1 UR AG: L. pneumophila Serogp 1 Ur Ag: NEGATIVE

## 2019-04-18 IMAGING — DX PORTABLE CHEST - 1 VIEW
1 series · 1 of 1 positions shown · non-contrast
Comparison: [DATE]

CLINICAL DATA: Pleural effusion.

EXAM:
PORTABLE CHEST 1 VIEW

[chest ap]
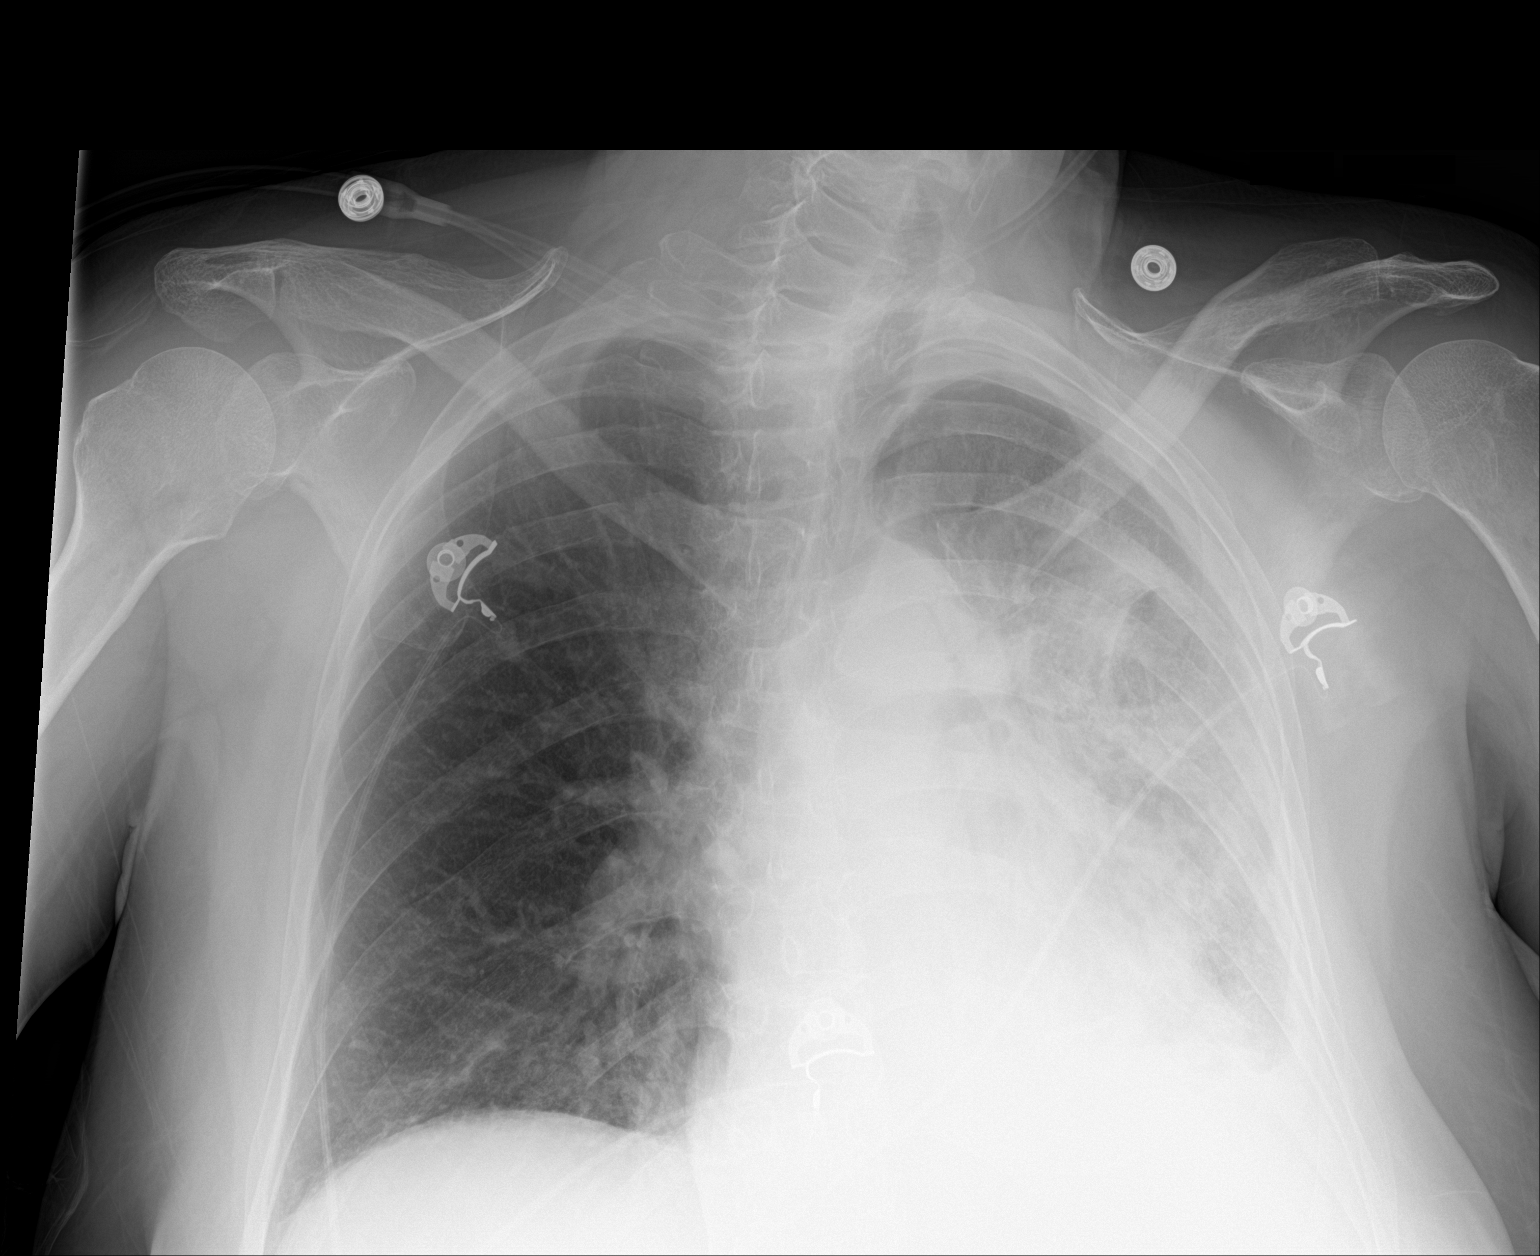

[1 of 1 positions shown; findings below may reference images not displayed]

FINDINGS: Again noted is extensive consolidation throughout the left lung
field. There is no pneumothorax. There is a probable small
left-sided pleural effusion. The mediastinum is shifted to the left
consistent with left-sided volume loss. The right lung field is
essentially clear.
IMPRESSION: No significant interval change.

## 2019-04-18 MED ORDER — ENOXAPARIN SODIUM 100 MG/ML ~~LOC~~ SOLN
1.5000 mg/kg | SUBCUTANEOUS | Status: DC
  Administered 2019-04-18 – 2019-04-22 (×5): 100 mg via SUBCUTANEOUS
  Filled 2019-04-18 (×5): qty 1

## 2019-04-18 MED ORDER — ADULT MULTIVITAMIN W/MINERALS CH
1.0000 | ORAL_TABLET | Freq: Every day | ORAL | Status: DC
  Administered 2019-04-19 – 2019-04-23 (×4): 1 via ORAL
  Filled 2019-04-18 (×5): qty 1

## 2019-04-18 NOTE — Progress Notes (Signed)
Physical Therapy Treatment Patient Details Name: Tina Cruz MRN: [id number] DOB: May 21, 1961 Today's Date: 04/18/2019    History of Present Illness 58 yo female admitted to ED on 7/5 with abdominal pain, N/V, with diagnosis of PNA. Pt with stage IV lung cancer with mets to brain and bone, CT abdomen reveals multiple splenic infarcts, L3-L4 and T12 compression fractures, L pleural effusion. PMH includes depression, PE, GERD.    PT Comments    Pt assisted with ambulating short distance in hallway with RW.  Pt did not use assistive device in room to use bathroom however holding door and bed rails to self assist.  Pt fatigues quickly and may benefit from rollator at home.  Pt requested return to bed end of session.  SPO2 90% at lowest with activity on room air however pt reports moderate SOB so reapplied 2L O2 [name] end of session.   Follow Up Recommendations  Home health PT;Supervision for mobility/OOB     Equipment Recommendations  Other (comment)(rollator for safety and endurance)    Recommendations for Other Services       Precautions / Restrictions Precautions Precautions: Fall Precaution Comments: monitor HR    Mobility  Bed Mobility Overal bed mobility: Needs Assistance Bed Mobility: Sit to Supine       Sit to supine: Supervision;HOB elevated   General bed mobility comments: Supervision for safety, increased time and effort to perform  Transfers Overall transfer level: Needs assistance Equipment used: Rolling walker (2 wheeled) Transfers: Sit to/from Stand Sit to Stand: Min guard         General transfer comment: min/guard for safety, increased time required, self assist with UEs  Ambulation/Gait Ambulation/Gait assistance: Min guard Gait Distance (Feet): 40 Feet Assistive device: Rolling walker (2 wheeled) Gait Pattern/deviations: Step-through pattern;Trunk flexed     General Gait Details: verbal cues for RW positioning, posture; SpO2 90% on room air  upon returning to room, 96% on room air upon returning to bed from bathroom however pt reports moderate SOB so reapplied 2L O2 [name]; HR up to 124 bpm with activity   Stairs             Wheelchair Mobility    Modified Rankin (Stroke Patients Only)       Balance           Standing balance support: No upper extremity supported Standing balance-Leahy Scale: Fair                              Cognition Arousal/Alertness: Awake/alert Behavior During Therapy: WFL for tasks assessed/performed Overall Cognitive Status: Within Functional Limits for tasks assessed                                        Exercises      General Comments        Pertinent Vitals/Pain Pain Assessment: 0-10 Pain Score: 5  Pain Location: back, from thoracentesis site Pain Descriptors / Indicators: Sore Pain Intervention(s): Monitored during session;Repositioned    Home Living                      Prior Function            PT Goals (current goals can now be found in the care plan section) Progress towards PT goals: Progressing toward goals    Frequency  Min 3X/week      PT Plan Current plan remains appropriate    Co-evaluation              AM-PAC PT "6 Clicks" Mobility   Outcome Measure  Help needed turning from your back to your side while in a flat bed without using bedrails?: A Little Help needed moving from lying on your back to sitting on the side of a flat bed without using bedrails?: A Little Help needed moving to and from a bed to a chair (including a wheelchair)?: A Little Help needed standing up from a chair using your arms (e.g., wheelchair or bedside chair)?: A Little Help needed to walk in hospital room?: A Little Help needed climbing 3-5 steps with a railing? : A Lot 6 Click Score: 17    End of Session Equipment Utilized During Treatment: Gait belt;Oxygen Activity Tolerance: Patient limited by fatigue Patient left:  in bed;with bed alarm set;with call bell/phone within reach;with SCD's reapplied   PT Visit Diagnosis: Other abnormalities of gait and mobility (R26.89);Muscle weakness (generalized) (M62.81)     Time: 4888-9169 PT Time Calculation (min) (ACUTE ONLY): 19 min  Charges:  $Gait Training: 8-22 mins                    Carmelia Bake, PT, DPT Acute Rehabilitation Services Office: [phone] Pager: [phone]  Trena Platt 04/18/2019, 3:00 PM

## 2019-04-18 NOTE — Telephone Encounter (Signed)
Per Dr. Pearlie Oyster request, I called Tina Cruz to let her know her treated brain mets responded well to the radiation treatment, and there are no new lesions. The plan is to repeat a brain MRI in 48mo, f/u w neurosurgeon.   Tina Cruz was happy to hear the good news and is in agreement with this plan.   Mont Dutton R.T.(R)(T) Special Procedures Navigator [phone]

## 2019-04-18 NOTE — Progress Notes (Signed)
HEMATOLOGY-ONCOLOGY PROGRESS NOTE  SUBJECTIVE: Feels slightly better this morning.  She still has nausea but is not vomiting.  Able to keep clear liquids down.  Denies chest discomfort or shortness of breath.  No bleeding.  She underwent a left sided thoracentesis on 04/17/2019 with 100 cc of pleural fluid removed.  Culture is negative to date.  Cytology pending.  REVIEW OF SYSTEMS:   Constitutional: Denies fevers, chills.  Reports fatigue and weakness. Eyes: Denies blurriness of vision Ears, nose, mouth, throat, and face: Denies mucositis or sore throat Respiratory: Denies cough, dyspnea or wheezes Cardiovascular: Denies palpitation, chest discomfort Gastrointestinal: Reports nausea this morning but no vomiting or diarrhea. Skin: Denies abnormal skin rashes Lymphatics: Denies new lymphadenopathy or easy bruising Neurological:Denies numbness, tingling or new weaknesses Behavioral/Psych: Mood is stable, no new changes  Extremities: No lower extremity edema All other systems were reviewed with the patient and are negative.  I have reviewed the past medical history, past surgical history, social history and family history with the patient and they are unchanged from previous note.   PHYSICAL EXAMINATION: ECOG PERFORMANCE STATUS: 1 - Symptomatic but completely ambulatory  Vitals:   04/18/19 0453 04/18/19 0900  BP: 118/77   Pulse: (!) 102   Resp: 20   Temp: 99.5 F (37.5 C)   SpO2: 96% 96%   Filed Weights   04/16/19 2043 04/17/19 0028 04/17/19 0433  Weight: 155 lb (70.3 kg) 150 lb (68 kg) 148 lb 3.2 oz (67.2 kg)    Intake/Output from previous day: 07/06 0701 - 07/07 0700 In: 1648.3 [P.O.:20; I.V.:1197.7; IV Piggyback:430.5] Out: 0   GENERAL:alert, no distress and comfortable SKIN: skin color, texture, turgor are normal, no rashes or significant lesions EYES: normal, Conjunctiva are pink and non-injected, sclera clear OROPHARYNX:no exudate, no erythema and lips, buccal mucosa,  and tongue normal  NECK: supple, thyroid normal size, non-tender, without nodularity LYMPH:  no palpable lymphadenopathy in the cervical, axillary or inguinal LUNGS: Normal breathing effort, diminished in the left base.   HEART: regular rate & rhythm and no murmurs and no lower extremity edema ABDOMEN:abdomen soft, non-tender and normal bowel sounds Musculoskeletal:no cyanosis of digits and no clubbing  NEURO: alert & oriented x 3 with fluent speech, no focal motor/sensory deficits  LABORATORY DATA:  I have reviewed the data as listed CMP Latest Ref Rng & Units 04/18/2019 04/17/2019 04/16/2019  Glucose 70 - 99 mg/dL 118(H) 134(H) 128(H)  BUN 6 - 20 mg/dL 11 14 14   Creatinine 0.44 - 1.00 mg/dL 0.62 0.64 0.69  Sodium 135 - 145 mmol/L 137 140 142  Potassium 3.5 - 5.1 mmol/L 4.4 3.8 3.8  Chloride 98 - 111 mmol/L 106 105 105  CO2 22 - 32 mmol/L 22 24 22   Calcium 8.9 - 10.3 mg/dL 8.8(L) 9.2 9.5  Total Protein 6.5 - 8.1 g/dL - - 7.1  Total Bilirubin 0.3 - 1.2 mg/dL - - 0.8  Alkaline Phos 38 - 126 U/L - - 77  AST 15 - 41 U/L - - 41  ALT 0 - 44 U/L - - 16    Lab Results  Component Value Date   WBC 11.8 (H) 04/18/2019   HGB 11.6 (L) 04/18/2019   HCT 35.4 (L) 04/18/2019   MCV 91.7 04/18/2019   PLT 122 (L) 04/18/2019   NEUTROABS 9.5 (H) 04/16/2019    Ct Chest Wo Contrast  Result Date: 04/17/2019 CLINICAL DATA:  Pt s/p thoracentesis. "medical history significant of metastatic non-small cell lung cancer, with metastasis to  bone, brain, right adrenal gland and malignant left pleural effusion presents with persistent nausea and vomiting since 3 a.m. today. Periumbilical pain without radiation. Chronic cough and shortness of breath, similar to baseline. EXAM: CT CHEST WITHOUT CONTRAST TECHNIQUE: Multidetector CT imaging of the chest was performed following the standard protocol without IV contrast. COMPARISON:  CT of the chest on 03/24/2019 FINDINGS: Cardiovascular: Heart size is normal. There is  atherosclerotic calcification of the coronary arteries. Normal noncontrast appearance of the pulmonary arteries. There is minimal atherosclerosis of the thoracic aorta, not associated with aneurysm. Mediastinum/Nodes: The visualized portion of the thyroid gland has a normal appearance. Esophagus is normal in appearance. Enlarged LEFT supraclavicular lymph nodes, largest 1.4 centimeters. Previously, largest was 1.0 centimeters. Small mediastinal and hilar lymph nodes, none of which reach criteria for enlargement. Lungs/Pleura: There is no pneumothorax following thoracentesis. Persistent pleural collection identified posteriorly at the LEFT lung base. Although the study is not performed with intravenous contrast, suspect pleural thickening particularly at the LEFT lung base. Focal consolidations/mass in the LEFT lung apex is 1.6 x 2.2 centimeters, previously 2.3 x 1.8 centimeters. Extensive consolidations seen throughout the LEFT UPPER lobe with a extensive air bronchograms. Ground-glass opacities are identified in the LEFT LOWER lobe. The LEFT UPPER lobe opacities are more confluent when compared with the prior study. There is focal consolidations/atelectasis at the MEDIAL RIGHT lung base, associated with air bronchograms and new since the prior study. The RIGHT lung is hyperinflated. Upper Abdomen: Contrast identified within the gallbladder and collecting systems of the kidneys. Colonic diverticulosis is noted. The adrenal glands are normal in appearance. Musculoskeletal: Numerous blastic lesions within the thoracic and UPPER lumbar spine, ribs, and LEFT scapula. Stable compression fracture of T12. IMPRESSION: 1. No pneumothorax following thoracentesis. 2. Persistent pleural collection and pleural thickening at the LEFT lung base. 3. Interval development of focal consolidation RIGHT lung base. 4. More confluent opacity within the LEFT UPPER lobe. 5. Interval enlargement of LEFT supraclavicular lymph nodes,  suspicious for metastatic disease. 6. Stable compression fracture of T12. Sclerotic osseous metastases. 7. Coronary artery disease. 8. Colonic diverticulosis. 9.  Aortic atherosclerosis.  (ICD10-I70.0) Electronically Signed   By: Nolon Nations M.D.   On: 04/17/2019 12:31   Ct Chest W Contrast  Result Date: 03/24/2019 CLINICAL DATA:  Stage IV left upper lobe non-small cell lung cancer status post targeted chemotherapy, palliative radiotherapy to the brain, lung mass and bone metastases and left PleurX catheter placement. Restaging. EXAM: CT CHEST, ABDOMEN, AND PELVIS WITH CONTRAST TECHNIQUE: Multidetector CT imaging of the chest, abdomen and pelvis was performed following the standard protocol during bolus administration of intravenous contrast. CONTRAST:  22mL OMNIPAQUE IOHEXOL 300 MG/ML  SOLN COMPARISON:  01/28/2019 chest CT angiogram. 12/12/2018 PET-CT. 11/11/2018 chest CT. FINDINGS: CT CHEST FINDINGS Cardiovascular: Normal heart size. No significant pericardial effusion/thickening. Atherosclerotic nonaneurysmal thoracic aorta. Normal caliber pulmonary arteries. No central pulmonary emboli. Mediastinum/Nodes: No discrete thyroid nodules. Unremarkable esophagus. No axillary adenopathy. Multiple mildly enlarged left supraclavicular nodes, largest 1.2 cm (series 2/image 5), previously 1.0 cm on 01/27/2019 chest CT, minimally increased. No pathologically enlarged mediastinal or hilar nodes. Stable calcified left suprahilar 0.7 cm short axis node. Lungs/Pleura: No pneumothorax. Interval removal of left PleurX catheter. Small loculated dependent left pleural effusion with mildly irregular left pleural thickening, not definitely changed. No right pleural effusion. Improved aeration in the left upper lobe. Irregular nodular 2.3 x 1.8 cm focus of consolidation in the left upper lobe (series 5/image 42), previously obscured by atelectasis  and consolidation on 01/27/2019 chest CT. Sharply marginated extensive left  perihilar consolidation with associated extensive patchy ground-glass opacity throughout the left lung, largely new in the interval. Mild compressive atelectasis in the dependent left lower lobe. No acute consolidative airspace disease, lung masses or significant pulmonary nodules in the right lung. Musculoskeletal: There are widespread sclerotic osseous lesions throughout thoracic spine, bilateral ribs and bilateral scapula, which have significantly increased in sclerosis since 01/27/2019, for example in the anterior T8 vertebral body and in the upper left scapula (series 6/image 100). No definite new osseous lesions in the chest. Mild superior T12 vertebral compression fracture is new since 01/27/2019 chest CT. Moderate thoracic spondylosis. CT ABDOMEN PELVIS FINDINGS Hepatobiliary: Normal liver with no liver mass. Normal gallbladder with no radiopaque cholelithiasis. No biliary ductal dilatation. Pancreas: Low-attenuation 1.7 cm pancreatic neck lesion with coarse internal calcification (series 2/image 60), not appreciably changed since 11/11/2018 CT. No new pancreatic lesions. No pancreatic duct dilation. Spleen: Normal size. No mass. Adrenals/Urinary Tract: Normal adrenals. Normal kidneys with no hydronephrosis and no renal mass. Normal collapsed bladder. Stomach/Bowel: Small hiatal hernia. Otherwise normal nondistended stomach. Normal caliber small bowel with no small bowel wall thickening. Normal appendix. Oral contrast transits to the colon. Mild left colonic diverticulosis, with no large bowel wall thickening or significant pericolonic fat stranding. Vascular/Lymphatic: Atherosclerotic nonaneurysmal abdominal aorta. Patent portal, splenic, hepatic and renal veins. No pathologically enlarged lymph nodes in the abdomen or pelvis. Reproductive: Grossly normal uterus.  No adnexal mass. Other: No pneumoperitoneum, ascites or focal fluid collection. Musculoskeletal: There are widespread sclerotic osseous lesions  throughout the lumbar spine, sacrum, bilateral iliac bones and proximal femora bilaterally, which are significantly increased in sclerosis compared to the CT images from 12/12/2018 PET-CT study, correlating with the areas of skeletal hypermetabolism on that PET-CT study. No definite new focal osseous lesions. Mild superior L4 vertebral compression fracture is probably stable and chronic compared to the coronal reformats on 12/12/2018 PET-CT. Moderate lumbar spondylosis. IMPRESSION: 1. Widespread sclerotic osseous metastases throughout axial and proximal appendicular skeleton, significantly increased in sclerosis in the interval, a nonspecific change that probably reflects treatment effect. No definite new osseous lesions. 2. Mild left supraclavicular lymphadenopathy is slightly increased since 01/27/2019 chest CT angiogram study. 3. Aeration in the left upper lobe is improved in the interval. Irregular nodular focus of consolidation in the left upper lobe was previously obscured by consolidation/atelectasis, limiting comparison. New extensive sharply marginated perihilar consolidation and ground-glass opacity in the left lung. Findings are nonspecific but favored to represent evolving postradiation change. Close chest CT follow-up recommended. 4. Stable loculated small left pleural effusion with left pleural thickening. 5. Low-attenuation pancreatic neck lesion with internal coarse calcification is stable since 11/11/2018 CT, probably nonaggressive cystic pancreatic lesion. 6.  Aortic Atherosclerosis (ICD10-I70.0). Electronically Signed   By: Ilona Sorrel M.D.   On: 03/24/2019 17:48   Mr Jeri Cos WF Contrast  Result Date: 04/03/2019 CLINICAL DATA:  Restaging of non-small cell lung cancer. Two brain metastases treated with SRS on 12/30/2018. EXAM: MRI HEAD WITHOUT AND WITH CONTRAST TECHNIQUE: Multiplanar, multiecho pulse sequences of the brain and surrounding structures were obtained without and with intravenous  contrast. CONTRAST:  44mL MULTIHANCE GADOBENATE DIMEGLUMINE 529 MG/ML IV SOLN COMPARISON:  12/25/2018 FINDINGS: BRAIN New Lesions: None. Larger lesions: None. Stable or Smaller lesions: 1. Decreased size of 5 mm right frontoparietal lesion (series 11, image 111, previously 14 mm) with nearly completely resolved edema and new chronic blood products. 2. At most faint residual  enhancement corresponding to the suspected 2 mm left frontal metastasis on the prior study (series 11, image 87 and series 13, image 27) Other Brain findings: There is no evidence of acute infarct, midline shift, or extra-axial fluid collection. The ventricles and sulci are within normal limits. No age advanced cerebral white matter disease is evident. Vascular: Major intracranial vascular flow voids are preserved. Skull and upper cervical spine: Increased size of multiple skull metastases without significant extraosseous extension. Sinuses/Orbits: Unremarkable orbits. Clear paranasal sinuses. Moderate right mastoid effusion. Other: None. IMPRESSION: 1. Decreased size of the 2 treated brain metastases. 2. No evidence of new brain metastases or acute intracranial abnormality. 3. Interval enlargement of multiple skull metastases. Electronically Signed   By: Logan Bores M.D.   On: 04/03/2019 15:16   Ct Abdomen Pelvis W Contrast  Result Date: 04/16/2019 CLINICAL DATA:  Acute abdominal pain. Nausea and vomiting. Neutropenia. EXAM: CT ABDOMEN AND PELVIS WITH CONTRAST TECHNIQUE: Multidetector CT imaging of the abdomen and pelvis was performed using the standard protocol following bolus administration of intravenous contrast. CONTRAST:  127mL OMNIPAQUE IOHEXOL 300 MG/ML  SOLN COMPARISON:  Most recent imaging CT 03/24/2019, PET CT 12/12/2018 FINDINGS: Lower chest: Left pleural effusion with enhancement, similar to slightly decreased from prior exam. Unchanged compressive atelectasis and ground-glass opacities throughout the left lung base. New linear  consolidation with air bronchograms in the medial right lower lobe. Hepatobiliary: Hepatic steatosis. No focal lesion. Gallbladder physiologically distended, no calcified stone. No biliary dilatation. Pancreas: 17 mm low-density lesion in the pancreatic neck with central quest occasion, unchanged parenchymal atrophy. No ductal dilatation or inflammation. Spleen: Multiple wedge-shaped low-density defects primarily in the mid lower spleen consistent with infarcts. No perisplenic fluid. Adrenals/Urinary Tract: No adrenal nodule. No hydronephrosis or perinephric edema. Small slightly wedge-shaped defect of the superior right kidney, best appreciated on sagittal image 57 series 6, not present on prior exam. Symmetric excretion on delayed phase imaging. Urinary bladder is physiologically distended without wall thickening. Stomach/Bowel: Small hiatal hernia. Stomach physiologically distended. No bowel wall thickening, inflammatory change, or obstruction. Mild colonic diverticulosis without diverticulitis. Normal appendix. Vascular/Lymphatic: Calcified aortic atherosclerosis. No aneurysm. Patent portal vein and mesenteric vessels. No enlarged lymph nodes in the abdomen or pelvis. Reproductive: Uterus and bilateral adnexa are unremarkable. Other: No ascites. No free air or intra-abdominal fluid collection. Musculoskeletal: Known multifocal sclerotic metastatic disease which appears similar to prior exam. L4 and T12 compression fractures are unchanged from prior. Mild central depression of superior L3 may represent interval mild pathologic compression fracture. IMPRESSION: 1. Multiple splenic infarcts, new from prior exam. 2. Small wedge-shaped defect in the superior right kidney, new from prior exam. Given splenic infarcts, small infarct is favored over pyelonephritis. 3. Unchanged cystic lesion in the pancreatic neck with internal calcification. 4. Mild central depression of superior L3 is new and may reflect interval mild  pathologic compression fracture. Unchanged multifocal sclerotic osseous metastatic disease. Mild L4 and T12 compression fractures are unchanged. 5. Linear consolidation in the medial right lower lobe, not seen previously, this may represent postradiation change versus pneumonia. 6. Chronic partially loculated left pleural effusion. 7. Colonic diverticulosis without diverticulitis. Aortic Atherosclerosis (ICD10-I70.0). Electronically Signed   By: Keith Rake M.D.   On: 04/16/2019 23:52   Ct Abdomen Pelvis W Contrast  Result Date: 03/24/2019 CLINICAL DATA:  Stage IV left upper lobe non-small cell lung cancer status post targeted chemotherapy, palliative radiotherapy to the brain, lung mass and bone metastases and left PleurX catheter placement. Restaging. EXAM: CT  CHEST, ABDOMEN, AND PELVIS WITH CONTRAST TECHNIQUE: Multidetector CT imaging of the chest, abdomen and pelvis was performed following the standard protocol during bolus administration of intravenous contrast. CONTRAST:  66mL OMNIPAQUE IOHEXOL 300 MG/ML  SOLN COMPARISON:  01/28/2019 chest CT angiogram. 12/12/2018 PET-CT. 11/11/2018 chest CT. FINDINGS: CT CHEST FINDINGS Cardiovascular: Normal heart size. No significant pericardial effusion/thickening. Atherosclerotic nonaneurysmal thoracic aorta. Normal caliber pulmonary arteries. No central pulmonary emboli. Mediastinum/Nodes: No discrete thyroid nodules. Unremarkable esophagus. No axillary adenopathy. Multiple mildly enlarged left supraclavicular nodes, largest 1.2 cm (series 2/image 5), previously 1.0 cm on 01/27/2019 chest CT, minimally increased. No pathologically enlarged mediastinal or hilar nodes. Stable calcified left suprahilar 0.7 cm short axis node. Lungs/Pleura: No pneumothorax. Interval removal of left PleurX catheter. Small loculated dependent left pleural effusion with mildly irregular left pleural thickening, not definitely changed. No right pleural effusion. Improved aeration in the  left upper lobe. Irregular nodular 2.3 x 1.8 cm focus of consolidation in the left upper lobe (series 5/image 42), previously obscured by atelectasis and consolidation on 01/27/2019 chest CT. Sharply marginated extensive left perihilar consolidation with associated extensive patchy ground-glass opacity throughout the left lung, largely new in the interval. Mild compressive atelectasis in the dependent left lower lobe. No acute consolidative airspace disease, lung masses or significant pulmonary nodules in the right lung. Musculoskeletal: There are widespread sclerotic osseous lesions throughout thoracic spine, bilateral ribs and bilateral scapula, which have significantly increased in sclerosis since 01/27/2019, for example in the anterior T8 vertebral body and in the upper left scapula (series 6/image 100). No definite new osseous lesions in the chest. Mild superior T12 vertebral compression fracture is new since 01/27/2019 chest CT. Moderate thoracic spondylosis. CT ABDOMEN PELVIS FINDINGS Hepatobiliary: Normal liver with no liver mass. Normal gallbladder with no radiopaque cholelithiasis. No biliary ductal dilatation. Pancreas: Low-attenuation 1.7 cm pancreatic neck lesion with coarse internal calcification (series 2/image 60), not appreciably changed since 11/11/2018 CT. No new pancreatic lesions. No pancreatic duct dilation. Spleen: Normal size. No mass. Adrenals/Urinary Tract: Normal adrenals. Normal kidneys with no hydronephrosis and no renal mass. Normal collapsed bladder. Stomach/Bowel: Small hiatal hernia. Otherwise normal nondistended stomach. Normal caliber small bowel with no small bowel wall thickening. Normal appendix. Oral contrast transits to the colon. Mild left colonic diverticulosis, with no large bowel wall thickening or significant pericolonic fat stranding. Vascular/Lymphatic: Atherosclerotic nonaneurysmal abdominal aorta. Patent portal, splenic, hepatic and renal veins. No pathologically  enlarged lymph nodes in the abdomen or pelvis. Reproductive: Grossly normal uterus.  No adnexal mass. Other: No pneumoperitoneum, ascites or focal fluid collection. Musculoskeletal: There are widespread sclerotic osseous lesions throughout the lumbar spine, sacrum, bilateral iliac bones and proximal femora bilaterally, which are significantly increased in sclerosis compared to the CT images from 12/12/2018 PET-CT study, correlating with the areas of skeletal hypermetabolism on that PET-CT study. No definite new focal osseous lesions. Mild superior L4 vertebral compression fracture is probably stable and chronic compared to the coronal reformats on 12/12/2018 PET-CT. Moderate lumbar spondylosis. IMPRESSION: 1. Widespread sclerotic osseous metastases throughout axial and proximal appendicular skeleton, significantly increased in sclerosis in the interval, a nonspecific change that probably reflects treatment effect. No definite new osseous lesions. 2. Mild left supraclavicular lymphadenopathy is slightly increased since 01/27/2019 chest CT angiogram study. 3. Aeration in the left upper lobe is improved in the interval. Irregular nodular focus of consolidation in the left upper lobe was previously obscured by consolidation/atelectasis, limiting comparison. New extensive sharply marginated perihilar consolidation and ground-glass opacity in the left  lung. Findings are nonspecific but favored to represent evolving postradiation change. Close chest CT follow-up recommended. 4. Stable loculated small left pleural effusion with left pleural thickening. 5. Low-attenuation pancreatic neck lesion with internal coarse calcification is stable since 11/11/2018 CT, probably nonaggressive cystic pancreatic lesion. 6.  Aortic Atherosclerosis (ICD10-I70.0). Electronically Signed   By: Ilona Sorrel M.D.   On: 03/24/2019 17:48   Dg Chest Portable 1 View  Result Date: 04/17/2019 CLINICAL DATA:  Shortness of breath. History of lung  cancer. Questionable pna on CT EXAM: PORTABLE CHEST 1 VIEW COMPARISON:  Chest CT 03/24/2019. Lung bases from abdominal CT earlier this day. FINDINGS: Increased volume loss in left hemithorax with left perihilar opacity from prior chest CT. Left pleural effusion. Grossly unchanged heart size and mediastinal contours, left heart border obscured. No pulmonary edema. The consolidation in the medial right lower lobe on CT is not seen radiographically. Right lung is otherwise clear. IMPRESSION: 1. Increased volume loss in the left hemithorax and perihilar density since prior CT. Findings may be due to post treatment related or postobstructive change, patient with known left lung cancer. Left pleural effusion. 2. The right lung base consolidation on abdominal CT earlier today is not visualized radiographically. Electronically Signed   By: Keith Rake M.D.   On: 04/17/2019 01:36   US Thoracentesis Asp Pleural Space W/img Guide  Result Date: 04/17/2019 INDICATION: Patient with history of metastatic lung cancer, recurrent malignant left pleural effusion; request made for diagnostic and therapeutic left thoracentesis. EXAM: ULTRASOUND GUIDED DIAGNOSTIC AND THERAPEUTIC LEFT THORACENTESIS MEDICATIONS: None COMPLICATIONS: None immediate. PROCEDURE: An ultrasound guided thoracentesis was thoroughly discussed with the patient and questions answered. The benefits, risks, alternatives and complications were also discussed. The patient understands and wishes to proceed with the procedure. Written consent was obtained. Ultrasound was performed to localize and mark an adequate pocket of fluid in the left chest. The area was then prepped and draped in the normal sterile fashion. 1% Lidocaine was used for local anesthesia. Under ultrasound guidance a 6 Fr Safe-T-Centesis catheter was introduced. Thoracentesis was performed. The catheter was removed and a dressing applied. FINDINGS: A total of approximately 100 cc of  amber/blood-tinged fluid was removed. Samples were sent to the laboratory as requested by the clinical team. The amount of free pleural fluid was small on today's ultrasound and multiloculated. IMPRESSION: Successful ultrasound guided diagnostic and therapeutic left thoracentesis yielding 100 cc of pleural fluid. Read by: Rowe Robert, PA-C Electronically Signed   By: Jerilynn Mages.  Shick M.D.   On: 04/17/2019 11:56    ASSESSMENT AND PLAN: 1.  Stage IV non-small cell lung cancer, adenocarcinoma 2.  Splenic infarct/right renal infarct 3.  History of pulmonary embolism 4.  Recurrent left pleural effusion 5.  Nausea and vomiting; improved 6.  Right lower lobe pneumonia versus aspiration versus radiation changes  -CT scan of the chest reviewed.  She has a slight interval enlargement of the left supraclavicular lymph node but otherwise stable disease.  Recommend that she continue Tagrisso 80 mg daily. -The patient has a new splenic infarct and right renal infarct.  She was previously on Eliquis with no missed doses.  I talked with the patient today regarding anticoagulation options.  We discussed the best option for her at this point is to transition to Lovenox 1.5 mg/kg daily.  We have discussed that she will need to continue to give herself Lovenox injections at home after discharge.  She is agreeable to transitioning to Lovenox.  I have placed  the order to start this today. -Status post thoracentesis with only 100 cc of fluid removed.  CT scan shows persistent pleural collection at the left lung base.  She is currently asymptomatic.  Continue to monitor and repeat thoracentesis as needed. -Continue antiemetics as needed. -For possible pneumonia, continue antibiotics per hospitalist.  Sputum culture and blood culture are pending.   LOS: 1 day   Mikey Bussing, DNP, AGPCNP-BC, AOCNP 04/18/19

## 2019-04-18 NOTE — Progress Notes (Signed)
PT complains of sudden left sided flank pain. States it hurts when she inhales and that she feels SOB. Oxygen saturation 91-93% with 2L [place]. Lung fields diminished. On call provider Baltazar Najjar, NP made aware. New order for STAT CXR. Will continue to monitor closely.

## 2019-04-18 NOTE — Progress Notes (Signed)
Initial Nutrition Assessment  RD working remotely.  DOCUMENTATION CODES:   Not applicable  INTERVENTION:  Continue Boost Breeze po TID, each supplement provides 250 kcal and 9 grams of protein.   Once diet advances provide Ensure Enlive po TID, each supplement provides 350 kcal and 20 grams of protein.   Provide daily MVI.   Encouraged adequate intake of calories and protein as appetite returns and patient is able to tolerate meals better.  NUTRITION DIAGNOSIS:   Increased nutrient needs related to catabolic illness, cancer and cancer related treatments as evidenced by estimated needs.  GOAL:   Patient will meet greater than or equal to 90% of their needs  MONITOR:   Diet advancement, PO intake, Supplement acceptance, Labs, Weight trends, I & O's  REASON FOR ASSESSMENT:   Malnutrition Screening Tool    ASSESSMENT:   58 year old female with PMHx of depression, chronic bronchitis, hypothyroidism, GERD, stage IV non-small cell lung cancer, malignant left pleural effusion who presented with intractable nausea and nonbloody emesis with abdominal pain found to have splenic and right renal infarct, PNA, recurrent malignant left pleural effusion s/p left thoracentesis with 100 mL pleural fluid removed, L3 compression fracture.   Spoke with patient over the phone. She reports her appetite has been decreased for months now, but it has been very poor since 7/5. She has been experiencing loss of hunger, nausea, vomiting, and abdominal pain. She reports she was able to keep down sips of clears today. She is also going to be trying some [employer]. Prior to worsening of appetite on 7/5 patient typically ate 2 meals per day and some snacks. For breakfast she usually has two pieces of toast. Lunch is a small snack like a cookie. Dinner is usually some meat or another type of protein. She also drinks Boost/Ensure at home that her husband purchases.  Patient reports her UBW had been about 190  lbs. Per chart she was 191 lbs on 11/15/2018. She is now 67.2 kg (148.2 lbs). She has lost 42.8 lbs (22.4% body weight) over the past 5 months, which is significant for time frame.  Medications reviewed and include: levothyroxine, pantoprazole, NS with KCl 40 mEq/L at 75 mL/hr, azithromycin, ceftriaxone.  Labs reviewed.  Suspect patient is malnourished, but unable to confirm without completing NFPE or having more detailed estimate of normal daily caloric consumption PTA.  NUTRITION - FOCUSED PHYSICAL EXAM:  Unable to complete at this time.  Diet Order:   Diet Order            Diet clear liquid Room service appropriate? Yes; Fluid consistency: Thin  Diet effective now             EDUCATION NEEDS:   Education needs have been addressed  Skin:  Skin Assessment: Reviewed RN Assessment  Last BM:  04/16/2019 per chart  Height:   Ht Readings from Last 1 Encounters:  04/17/19 5\' 7"  (1.702 m)   Weight:   Wt Readings from Last 1 Encounters:  04/17/19 67.2 kg   Ideal Body Weight:  61.4 kg  BMI:  Body mass index is 23.21 kg/m.  Estimated Nutritional Needs:   Kcal:  1680-1940 (MSJ x 1.3-1.5)  Protein:  85-100 grams (1.3-1.5 grams/kg)  Fluid:  2 L/day  Willey Blade, MS, RD, LDN Office: [phone] Pager: [phone] After Hours/Weekend Pager: [phone]

## 2019-04-18 NOTE — Evaluation (Signed)
Occupational Therapy Evaluation Patient Details Name: Tina Cruz MRN: [id number] DOB: June 13, 1961 Today's Date: 04/18/2019    History of Present Illness 58 yo female admitted to ED on 7/5 with abdominal pain, N/V, with diagnosis of PNA. Pt with stage IV lung cancer with mets to brain and bone, CT abdomen reveals multiple splenic infarcts, L3-L4 and T12 compression fractures, L pleural effusion. PMH includes depression, PE, GERD.   Clinical Impression   Pt admitted with pneumonia Pt currently with functional limitations due to the deficits listed below (see OT Problem List).  Pt will benefit from skilled OT to increase their safety and independence with ADL and functional mobility for ADL to facilitate discharge to venue listed below.      Follow Up Recommendations  Supervision/Assistance - 24 hour    Equipment Recommendations  3 in 1 bedside commode    Recommendations for Other Services       Precautions / Restrictions Precautions Precautions: Fall Precaution Comments: monitor HR      Mobility Bed Mobility Overal bed mobility: Needs Assistance Bed Mobility: Sidelying to Sit   Sidelying to sit: Min assist   Sit to supine: Supervision;HOB elevated   General bed mobility comments: Supervision for safety, increased time and effort to perform  Transfers Overall transfer level: Needs assistance Equipment used: Rolling walker (2 wheeled) Transfers: Sit to/from Stand Sit to Stand: Min assist         General transfer comment: min/guard for safety, increased time required, self assist with UEs    Balance Overall balance assessment: Needs assistance Sitting-balance support: Feet supported Sitting balance-Leahy Scale: Good     Standing balance support: No upper extremity supported Standing balance-Leahy Scale: Fair                             ADL either performed or assessed with clinical judgement   ADL Overall ADL's : Needs  assistance/impaired Eating/Feeding: Set up;Sitting   Grooming: Set up;Sitting   Upper Body Bathing: Set up;Sitting   Lower Body Bathing: Moderate assistance;Sit to/from stand   Upper Body Dressing : Set up;Sitting   Lower Body Dressing: Moderate assistance;Cueing for safety;Cueing for sequencing;Cueing for compensatory techniques   Toilet Transfer: Minimal assistance;Stand-pivot;Cueing for sequencing;Cueing for safety;BSC   Toileting- Clothing Manipulation and Hygiene: Minimal assistance         General ADL Comments: pt states husband will A her at home.     Vision Patient Visual Report: No change from baseline       Perception     Praxis      Pertinent Vitals/Pain Pain Assessment: 0-10 Pain Score: 4  Pain Location: back Pain Descriptors / Indicators: Sore Pain Intervention(s): Monitored during session;Repositioned     Hand Dominance Right   Extremity/Trunk Assessment Upper Extremity Assessment Upper Extremity Assessment: Generalized weakness       Cervical / Trunk Assessment Cervical / Trunk Assessment: Kyphotic   Communication Communication Communication: No difficulties   Cognition Arousal/Alertness: Awake/alert Behavior During Therapy: WFL for tasks assessed/performed Overall Cognitive Status: Within Functional Limits for tasks assessed                                     General Comments       Exercises     Shoulder Instructions      Home Living Family/patient expects to be discharged to:: Private residence Living  Arrangements: Spouse/significant other Available Help at Discharge: Family;Available PRN/intermittently Type of Home: House Home Access: Stairs to enter [employer] of Steps: 5 Entrance Stairs-Rails: Right;Left Home Layout: One level     Bathroom Shower/Tub: Teacher, early years/pre: Standard     Home Equipment: None          Prior Functioning/Environment Level of Independence:  Independent        Comments: Pt reports pt and husband are discussing possibly getting tub bench. Pt reports she can do everything for self, but requires increased time. Pt states husband works 9-5        OT Problem List: Decreased strength;Decreased activity tolerance      OT Treatment/Interventions: Self-care/ADL training;DME and/or AE instruction;Therapeutic activities;Energy conservation    OT Goals(Current goals can be found in the care plan section) Acute Rehab OT Goals Patient Stated Goal: feel better OT Goal Formulation: With patient Time For Goal Achievement: 05/02/19 Potential to Achieve Goals: Good ADL Goals Pt Will Perform Grooming: with modified independence Pt Will Perform Upper Body Dressing: Independently;sitting Pt Will Perform Lower Body Dressing: with modified independence;sit to/from stand Pt Will Transfer to Toilet: with modified independence;regular height toilet Pt Will Perform Toileting - Clothing Manipulation and hygiene: with modified independence;sit to/from stand  OT Frequency: Min 2X/week   Barriers to D/C:            Co-evaluation              AM-PAC OT "6 Clicks" Daily Activity     Outcome Measure Help from another person eating meals?: None Help from another person taking care of personal grooming?: A Little Help from another person toileting, which includes using toliet, bedpan, or urinal?: A Little Help from another person bathing (including washing, rinsing, drying)?: A Little Help from another person to put on and taking off regular upper body clothing?: None Help from another person to put on and taking off regular lower body clothing?: A Little 6 Click Score: 20   End of Session Equipment Utilized During Treatment: Rolling walker Nurse Communication: Mobility status  Activity Tolerance: Patient tolerated treatment well Patient left: in chair;with chair alarm set  OT Visit Diagnosis: Unsteadiness on feet (R26.81);Muscle  weakness (generalized) (M62.81)                Time: 2956-2130 OT Time Calculation (min): 16 min Charges:  OT General Charges $OT Visit: 1 Visit OT Evaluation $OT Eval Moderate Complexity: 1 Mod  Kari Baars, [place] [phone] Office- [phone]     Lanelle Lindo, Edwena Felty D 04/18/2019, 3:57 PM

## 2019-04-18 NOTE — Progress Notes (Signed)
PROGRESS NOTE  Tina Cruz SWF:[id number] DOB: [birth year] DOA: 04/16/2019 PCP: [name], MD  HPI/Recap of past 78 hours: 58 year old female with PMH of stage IV non-small cell lung cancer, adenocarcinoma with metastasis to bone, brain, right adrenal gland, malignant left pleural effusion, GERD, hypothyroid, presented with intractable nausea, nonbloody emesis that began on the afternoon of admission, associated with periumbilical abdominal pain which she thinks may be related to her nausea and vomiting, chronic cough and dyspnea but now with productive cough and dyspnea on minimal exertion, inability to keep anything down.  She was admitted for intractable nausea, vomiting, abdominal pain, splenic and right renal infarcts, suspected community-acquired pneumonia versus aspiration pneumonia and recurrent left malignant pleural effusion.  IR and oncology were consulted.  S/p 100 mL ultrasound-guided left thoracentesis.  04/18/19: Patient seen and examined at bedside.  She reports nausea and dry heaving, persistent this morning.  Assessment/Plan: Principal Problem:   Pneumonia Active Problems:   GERD (gastroesophageal reflux disease)   Adenocarcinoma of left lung, stage 4 (HCC)   Recurrent left pleural effusion   Bone metastases (HCC)   Brain metastasis (HCC)   Splenic infarct   Lumbar compression fracture (HCC)   Renal infarct (HCC)   Abdominal pain   Nausea & vomiting  Intractable nausea, vomiting and abdominal pain Persistent nausea with dry heaving Suspect secondary to splenic and right renal infarct Continue supportive care No sign of obstruction on CT abdomen and pelvis Continue antiemetics  Community-acquired pneumonia versus post radiation changes Currently on azithromycin and Rocephin Obtain procalcitonin in the morning Monitor fever curve and WBC  Splenic and right renal infarct in the setting of stage IV lung cancer on Eliquis Reports compliance with  Eliquis Oncology recommends subcu Lovenox full dose Started full dose subcu Lovenox on 04/18/2019 No valvular lesions on TTE  Recurrent malignant L pleural affusion Post left thoracentesis with 100 cc of bloody pleural fluid removed Cytology is pending  L3 compression fracture Likely pathologic compression fracture in the setting of stage IV lung cancer with bone mets   Stage IV non-small cell lung cancer, adenocarcinoma  Oncology consultation appreciated.  Continue treatment with Tagrisso, patient will have to bring her home supplies.  Patient reportedly has an appointment with Dr. Isidore Moos, Radiation oncology on 7/7, please contact them in a.m.  I reviewed patient's recent MRI brain 04/03/2019 results with her spouse, 2 intracranial metastasis are decreasing in size but skull metastasis are enlarging.  History of pulmonary embolism  Home Eliquis held for thoracentesis, may resume anticoagulation on 7/7, oncology discussing Lovenox if patient agreeable.  Prolonged QTC  QTC improved from 497 on admission EKG to 475 on 7/6.  Minimize QT prolonging medications.  Monitor EKG periodically.    DVT prophylaxis:  Full dose subcu Lovenox Code Status: Full Family Communication: Discussed in detail with patient spouse, updated care and answered questions. Disposition:  DC to home when clinically improved.  Possibly in 2 to 3 days.   Consultants:  Medical oncology IR  Procedures:  Ultrasound-guided left thoracentesis by IR 7/6.  Antimicrobials:  IV ceftriaxone and azithromycin  Objective: Vitals:   04/18/19 0055 04/18/19 0253 04/18/19 0453 04/18/19 0900  BP: 119/75 119/70 118/77   Pulse: (!) 114 99 (!) 102   Resp:   20   Temp: 98.8 F (37.1 C) 99.2 F (37.3 C) 99.5 F (37.5 C)   TempSrc: Oral Oral Oral   SpO2:   96% 96%  Weight:      Height:  Intake/Output Summary (Last 24 hours) at 04/18/2019 1134 Last data filed at 04/18/2019 0844 Gross per 24 hour   Intake 1549.5 ml  Output 0 ml  Net 1549.5 ml   Filed Weights   04/16/19 2043 04/17/19 0028 04/17/19 0433  Weight: 70.3 kg 68 kg 67.2 kg    Exam:   General: 58 y.o. year-old female well developed well nourished in no acute distress.  Alert and oriented x3.  Cardiovascular: Regular rate and rhythm with no rubs or gallops.  No thyromegaly or JVD noted.    Respiratory: Faint rales at bases with no wheezes noted.  Poor inspiratory effort.  Abdomen: Soft nontender nondistended with normal bowel sounds x4 quadrants.  Musculoskeletal: Trace lower extremity edema. 2/4 pulses in all 4 extremities.  Psychiatry: Mood is appropriate for condition and setting   Data Reviewed: CBC: Recent Labs  Lab 04/16/19 2103 04/17/19 0448 04/18/19 0425  WBC 10.8* 10.6* 11.8*  NEUTROABS 9.5*  --   --   HGB 13.1 12.6 11.6*  HCT 39.0 37.7 35.4*  MCV 88.4 89.1 91.7  PLT 171 155 254*   Basic Metabolic Panel: Recent Labs  Lab 04/16/19 2103 04/17/19 0448 04/18/19 0425  NA 142 140 137  K 3.8 3.8 4.4  CL 105 105 106  CO2 22 24 22   GLUCOSE 128* 134* 118*  BUN 14 14 11   CREATININE 0.69 0.64 0.62  CALCIUM 9.5 9.2 8.8*  MG  --  2.2  --    GFR: Estimated Creatinine Clearance: 75.4 mL/min (by C-G formula based on SCr of 0.62 mg/dL). Liver Function Tests: Recent Labs  Lab 04/16/19 2103  AST 41  ALT 16  ALKPHOS 77  BILITOT 0.8  PROT 7.1  ALBUMIN 3.7   Recent Labs  Lab 04/16/19 2103  LIPASE 31   No results for input(s): AMMONIA in the last 168 hours. Coagulation Profile: Recent Labs  Lab 04/17/19 0448  INR 1.4*   Cardiac Enzymes: No results for input(s): CKTOTAL, CKMB, CKMBINDEX, TROPONINI in the last 168 hours. BNP (last 3 results) No results for input(s): PROBNP in the last 8760 hours. HbA1C: No results for input(s): HGBA1C in the last 72 hours. CBG: Recent Labs  Lab 04/17/19 0507  GLUCAP 119*   Lipid Profile: No results for input(s): CHOL, HDL, LDLCALC, TRIG,  CHOLHDL, LDLDIRECT in the last 72 hours. Thyroid Function Tests: Recent Labs    04/17/19 0448  TSH 1.054   Anemia Panel: No results for input(s): VITAMINB12, FOLATE, FERRITIN, TIBC, IRON, RETICCTPCT in the last 72 hours. Urine analysis:    Component Value Date/Time   COLORURINE YELLOW 04/16/2019 2103   APPEARANCEUR HAZY (A) 04/16/2019 2103   LABSPEC 1.017 04/16/2019 2103   PHURINE 7.0 04/16/2019 2103   GLUCOSEU NEGATIVE 04/16/2019 2103   HGBUR MODERATE (A) 04/16/2019 2103   BILIRUBINUR NEGATIVE 04/16/2019 2103   KETONESUR 80 (A) 04/16/2019 2103   PROTEINUR 30 (A) 04/16/2019 2103   NITRITE NEGATIVE 04/16/2019 2103   LEUKOCYTESUR TRACE (A) 04/16/2019 2103   Sepsis Labs: @LABRCNTIP (procalcitonin:4,lacticidven:4)  ) Recent Results (from the past 240 hour(s))  Blood culture (routine x 2)     Status: None (Preliminary result)   Collection Time: 04/16/19  9:03 PM   Specimen: BLOOD  Result Value Ref Range Status   Specimen Description   Final    BLOOD Performed at [facility], [place] [address], [place], [place] [address]    Special Requests   Final    BOTTLES DRAWN AEROBIC AND ANAEROBIC  Blood Culture adequate volume Performed at [place] [address], [place], [place] [address]    Culture   Final    NO GROWTH 1 DAY Performed at [facility], [place] [address] [place], [place] [address]    Report Status PENDING  Incomplete  SARS Coronavirus 2 (CEPHEID - Performed in [place] hospital lab), Hosp Order     Status: None   Collection Time: 04/17/19 12:22 AM   Specimen: Urine, Clean Catch; Nasopharyngeal  Result Value Ref Range Status   SARS Coronavirus 2 NEGATIVE NEGATIVE Final    Comment: (NOTE) If result is NEGATIVE SARS-CoV-2 target nucleic acids are NOT DETECTED. The SARS-CoV-2 RNA is generally detectable in upper and lower  respiratory specimens during the acute phase of infection. The lowest  concentration of  SARS-CoV-2 viral copies this assay can detect is 250  copies / mL. A negative result does not preclude SARS-CoV-2 infection  and should not be used as the sole basis for treatment or other  patient management decisions.  A negative result may occur with  improper specimen collection / handling, submission of specimen other  than nasopharyngeal swab, presence of viral mutation(s) within the  areas targeted by this assay, and inadequate number of viral copies  (<250 copies / mL). A negative result must be combined with clinical  observations, patient history, and epidemiological information. If result is POSITIVE SARS-CoV-2 target nucleic acids are DETECTED. The SARS-CoV-2 RNA is generally detectable in upper and lower  respiratory specimens dur ing the acute phase of infection.  Positive  results are indicative of active infection with SARS-CoV-2.  Clinical  correlation with patient history and other diagnostic information is  necessary to determine patient infection status.  Positive results do  not rule out bacterial infection or co-infection with other viruses. If result is PRESUMPTIVE POSTIVE SARS-CoV-2 nucleic acids MAY BE PRESENT.   A presumptive positive result was obtained on the submitted specimen  and confirmed on repeat testing.  While 2019 novel coronavirus  (SARS-CoV-2) nucleic acids may be present in the submitted sample  additional confirmatory testing may be necessary for epidemiological  and / or clinical management purposes  to differentiate between  SARS-CoV-2 and other Sarbecovirus currently known to infect humans.  If clinically indicated additional testing with an alternate test  methodology [phone]) is advised. The SARS-CoV-2 RNA is generally  detectable in upper and lower respiratory sp ecimens during the acute  phase of infection. The expected result is Negative. Fact Sheet for Patients:  StrictlyIdeas.no Fact Sheet for Healthcare  Providers: BankingDealers.co.za This test is not yet approved or cleared by the [place] FDA and has been authorized for detection and/or diagnosis of SARS-CoV-2 by FDA under an Emergency Use Authorization (EUA).  This EUA will remain in effect (meaning this test can be used) for the duration of the COVID-19 declaration under Section 564(b)(1) of the Act, 21 U.S.C. section 360bbb-3(b)(1), unless the authorization is terminated or revoked sooner. Performed at [facility], [place] [address], [place], [place] [address]   Urine culture     Status: None   Collection Time: 04/17/19 12:55 AM   Specimen: Urine, Random  Result Value Ref Range Status   Specimen Description   Final    URINE, RANDOM Performed at [place] [address], [place], [place] [address]    Special Requests   Final    NONE Performed at [facility], [place] [address]  [place], [place] [address]    Culture   Final    NO GROWTH Performed at [facility], [place] [address] [place], [place] [address]    Report Status 04/18/2019 FINAL  Final  MRSA PCR Screening     Status: None   Collection Time: 04/17/19  4:39 AM   Specimen: Nasal Mucosa; Nasopharyngeal  Result Value Ref Range Status   MRSA by PCR NEGATIVE NEGATIVE Final    Comment:        The GeneXpert MRSA Assay (FDA approved for NASAL specimens only), is one component of a comprehensive MRSA colonization surveillance program. It is not intended to diagnose MRSA infection nor to guide or monitor treatment for MRSA infections. Performed at [facility], [place] [address], [place], [place] [address]   Blood culture (routine x 2)     Status: None (Preliminary result)   Collection Time: 04/17/19  4:48 AM   Specimen: BLOOD  Result Value Ref Range Status   Specimen Description   Final    BLOOD RIGHT ANTECUBITAL Performed at [place] [address] [place], [place] [address]    Special Requests   Final    BOTTLES DRAWN AEROBIC ONLY Blood Culture adequate volume Performed at [place] [address], [place], [place] [address]    Culture   Final    NO GROWTH 1 DAY Performed at [facility], [place] [address] [place], [place] [address]    Report Status PENDING  Incomplete  Body fluid culture     Status: None (Preliminary result)   Collection Time: 04/17/19 12:03 PM   Specimen: Pleural, Left; Pleural Fluid  Result Value Ref Range Status   Specimen Description   Final    PLEURAL Performed at [facility], [place] [address] [place], [place] [address]    Special Requests   Final    NONE Performed at [facility], [place] [address], [place], [place] [address]    Gram Stain   Final    RARE WBC PRESENT, PREDOMINANTLY MONONUCLEAR NO ORGANISMS SEEN    Culture   Final    NO GROWTH < 24 HOURS Performed at [facility], [place] [address] [place], [place] [address]    Report Status PENDING  Incomplete  Sputum culture     Status: None   Collection Time: 04/18/19  8:00 AM   Specimen: Sputum  Result Value Ref Range Status   Specimen Description SPUTUM  Final   Special Requests NONE  Final   Sputum evaluation   Final    THIS SPECIMEN IS ACCEPTABLE FOR SPUTUM CULTURE Performed at [facility], [place] [address], [place], [place] [address]    Report Status 04/18/2019 FINAL  Final  Culture, respiratory     Status: None (Preliminary result)   Collection Time: 04/18/19  8:00 AM   Specimen: SPU  Result Value Ref Range Status   Specimen Description   Final    SPUTUM Performed at [place] [address], [place],  [address]    Special Requests   Final    NONE Reflexed from [phone] Performed at [facility], [place] [address], [place],  [address]    Gram Stain   Final    FEW WBC PRESENT,BOTH PMN  AND MONONUCLEAR FEW GRAM VARIABLE ROD RARE GRAM POSITIVE COCCI IN PAIRS Performed at [facility], [place] [address], [place], [place]  [address]    Culture PENDING  Incomplete   Report Status PENDING  Incomplete      Studies: Ct Chest Wo Contrast  Result Date: 04/17/2019 CLINICAL DATA:  Pt s/p thoracentesis. "medical history significant of metastatic non-small cell lung cancer, with metastasis to bone, brain, right adrenal gland and malignant left pleural effusion presents with persistent nausea and vomiting since 3 a.m. today. Periumbilical pain without radiation. Chronic cough and shortness of breath, similar to baseline. EXAM: CT CHEST WITHOUT CONTRAST TECHNIQUE: Multidetector CT imaging of the chest was performed following the standard protocol without IV contrast. COMPARISON:  CT of the chest on 03/24/2019 FINDINGS: Cardiovascular: Heart size is normal. There is atherosclerotic calcification of the coronary arteries. Normal noncontrast appearance of the pulmonary arteries. There is minimal atherosclerosis of the thoracic aorta, not associated with aneurysm. Mediastinum/Nodes: The visualized portion of the thyroid gland has a normal appearance. Esophagus is normal in appearance. Enlarged LEFT supraclavicular lymph nodes, largest 1.4 centimeters. Previously, largest was 1.0 centimeters. Small mediastinal and hilar lymph nodes, none of which reach criteria for enlargement. Lungs/Pleura: There is no pneumothorax following thoracentesis. Persistent pleural collection identified posteriorly at the LEFT lung base. Although the study is not performed with intravenous contrast, suspect pleural thickening particularly at the LEFT lung base. Focal consolidations/mass in the LEFT lung apex is 1.6 x 2.2 centimeters, previously 2.3 x 1.8 centimeters. Extensive consolidations seen throughout the LEFT UPPER lobe with a extensive air bronchograms. Ground-glass opacities are identified in the LEFT LOWER lobe. The  LEFT UPPER lobe opacities are more confluent when compared with the prior study. There is focal consolidations/atelectasis at the MEDIAL RIGHT lung base, associated with air bronchograms and new since the prior study. The RIGHT lung is hyperinflated. Upper Abdomen: Contrast identified within the gallbladder and collecting systems of the kidneys. Colonic diverticulosis is noted. The adrenal glands are normal in appearance. Musculoskeletal: Numerous blastic lesions within the thoracic and UPPER lumbar spine, ribs, and LEFT scapula. Stable compression fracture of T12. IMPRESSION: 1. No pneumothorax following thoracentesis. 2. Persistent pleural collection and pleural thickening at the LEFT lung base. 3. Interval development of focal consolidation RIGHT lung base. 4. More confluent opacity within the LEFT UPPER lobe. 5. Interval enlargement of LEFT supraclavicular lymph nodes, suspicious for metastatic disease. 6. Stable compression fracture of T12. Sclerotic osseous metastases. 7. Coronary artery disease. 8. Colonic diverticulosis. 9.  Aortic atherosclerosis.  (ICD10-I70.0) Electronically Signed   By: Nolon Nations M.D.   On: 04/17/2019 12:31   US Thoracentesis Asp Pleural Space W/img Guide  Result Date: 04/17/2019 INDICATION: Patient with history of metastatic lung cancer, recurrent malignant left pleural effusion; request made for diagnostic and therapeutic left thoracentesis. EXAM: ULTRASOUND GUIDED DIAGNOSTIC AND THERAPEUTIC LEFT THORACENTESIS MEDICATIONS: None COMPLICATIONS: None immediate. PROCEDURE: An ultrasound guided thoracentesis was thoroughly discussed with the patient and questions answered. The benefits, risks, alternatives and complications were also discussed. The patient understands and wishes to proceed with the procedure. Written consent was obtained. Ultrasound was performed to localize and mark an adequate pocket of fluid in the left chest. The area was then prepped and draped in the normal  sterile fashion. 1% Lidocaine was used for local anesthesia. Under ultrasound guidance a 6 Fr Safe-T-Centesis catheter was introduced. Thoracentesis was performed. The catheter was removed and a dressing applied. FINDINGS: A total of approximately 100 cc of amber/blood-tinged fluid was removed. Samples were sent to the laboratory as requested by the clinical team. The amount of free pleural fluid was small  on today's ultrasound and multiloculated. IMPRESSION: Successful ultrasound guided diagnostic and therapeutic left thoracentesis yielding 100 cc of pleural fluid. Read by: Rowe Robert, PA-C Electronically Signed   By: Jerilynn Mages.  Shick M.D.   On: 04/17/2019 11:56    Scheduled Meds:  enoxaparin (LOVENOX) injection  1.5 mg/kg Subcutaneous Q24H   feeding supplement  1 Container Oral TID BM   levothyroxine  112 mcg Oral Q0600   loratadine  10 mg Oral Daily   montelukast  10 mg Oral QHS   osimertinib mesylate  80 mg Oral Daily   pantoprazole (PROTONIX) IV  40 mg Intravenous Q24H    Continuous Infusions:  0.9 % NaCl with KCl 40 mEq / L 75 mL/hr (04/18/19 0844)   azithromycin 250 mL/hr at 04/18/19 0700   cefTRIAXone (ROCEPHIN)  IV Stopped (04/18/19 0610)     LOS: 1 day     Kayleen Memos, MD [facility] Pager [phone]  If 7PM-7AM, please contact night-coverage [email or web address] Password [facility] 04/18/2019, 11:34 AM

## 2019-04-19 LAB — PROCALCITONIN: Procalcitonin: 0.14 ng/mL

## 2019-04-19 MED ORDER — GUAIFENESIN ER 600 MG PO TB12
1200.0000 mg | ORAL_TABLET | Freq: Two times a day (BID) | ORAL | Status: DC
  Administered 2019-04-19 (×2): 1200 mg via ORAL
  Filled 2019-04-19 (×3): qty 2

## 2019-04-19 MED ORDER — GUAIFENESIN-DM 100-10 MG/5ML PO SYRP
5.0000 mL | ORAL_SOLUTION | ORAL | Status: DC | PRN
  Administered 2019-04-19 – 2019-04-21 (×7): 5 mL via ORAL
  Filled 2019-04-19 (×7): qty 10

## 2019-04-19 MED ORDER — HYDROCOD POLST-CPM POLST ER 10-8 MG/5ML PO SUER
5.0000 mL | Freq: Two times a day (BID) | ORAL | Status: DC
  Administered 2019-04-19 – 2019-04-23 (×9): 5 mL via ORAL
  Filled 2019-04-19 (×9): qty 5

## 2019-04-19 NOTE — Progress Notes (Signed)
PT Cancellation Note  Patient Details Name: Tina Cruz MRN: [id number] DOB: January 02, 1961   Cancelled Treatment:     pt feeling "really bad" today.  Curled up in bed, room dark.  Will attempt to see another time/day.  Pt has been evaluated with rec for [facility].  Last session amb with a Rollator 40 feet.     Rica Koyanagi  PTA Acute  Rehabilitation Services Pager      [phone] Office      [phone]

## 2019-04-19 NOTE — Progress Notes (Signed)
HEMATOLOGY-ONCOLOGY PROGRESS NOTE  SUBJECTIVE: Reports that she is not feeling well this morning.  She is very tired still having nausea.  Dates that nausea is better overall.  No diarrhea.  Tearful this morning.  Remains afebrile.  REVIEW OF SYSTEMS:   Constitutional: Denies fevers, chills.  Reports fatigue and weakness. Eyes: Denies blurriness of vision Ears, nose, mouth, throat, and face: Denies mucositis or sore throat Respiratory: Denies cough, dyspnea or wheezes Cardiovascular: Denies palpitation, chest discomfort Gastrointestinal: Reports nausea this morning but no vomiting or diarrhea. Skin: Denies abnormal skin rashes Lymphatics: Denies new lymphadenopathy or easy bruising Neurological:Denies numbness, tingling or new weaknesses Behavioral/Psych: Mood is stable, no new changes  Extremities: No lower extremity edema All other systems were reviewed with the patient and are negative.  I have reviewed the past medical history, past surgical history, social history and family history with the patient and they are unchanged from previous note.   PHYSICAL EXAMINATION: ECOG PERFORMANCE STATUS: 1 - Symptomatic but completely ambulatory  Vitals:   04/19/19 0546 04/19/19 0830  BP: 136/64   Pulse: (!) 115 (!) 107  Resp: 20   Temp: 98.4 F (36.9 C)   SpO2: 94% 94%   Filed Weights   04/16/19 2043 04/17/19 0028 04/17/19 0433  Weight: 155 lb (70.3 kg) 150 lb (68 kg) 148 lb 3.2 oz (67.2 kg)    Intake/Output from previous day: 07/07 0701 - 07/08 0700 In: 1974.5 [P.O.:120; I.V.:1671.3; IV Piggyback:183.3] Out: 0   GENERAL:alert, no distress and comfortable SKIN: skin color, texture, turgor are normal, no rashes or significant lesions EYES: normal, Conjunctiva are pink and non-injected, sclera clear OROPHARYNX:no exudate, no erythema and lips, buccal mucosa, and tongue normal  NECK: supple, thyroid normal size, non-tender, without nodularity LYMPH:  no palpable lymphadenopathy in  the cervical, axillary or inguinal LUNGS: Normal breathing effort, diminished in the left base.   HEART: regular rate & rhythm and no murmurs and no lower extremity edema ABDOMEN:abdomen soft, non-tender and normal bowel sounds Musculoskeletal:no cyanosis of digits and no clubbing  NEURO: alert & oriented x 3 with fluent speech, no focal motor/sensory deficits  LABORATORY DATA:  I have reviewed the data as listed CMP Latest Ref Rng & Units 04/18/2019 04/17/2019 04/16/2019  Glucose 70 - 99 mg/dL 118(H) 134(H) 128(H)  BUN 6 - 20 mg/dL 11 14 14   Creatinine 0.44 - 1.00 mg/dL 0.62 0.64 0.69  Sodium 135 - 145 mmol/L 137 140 142  Potassium 3.5 - 5.1 mmol/L 4.4 3.8 3.8  Chloride 98 - 111 mmol/L 106 105 105  CO2 22 - 32 mmol/L 22 24 22   Calcium 8.9 - 10.3 mg/dL 8.8(L) 9.2 9.5  Total Protein 6.5 - 8.1 g/dL - - 7.1  Total Bilirubin 0.3 - 1.2 mg/dL - - 0.8  Alkaline Phos 38 - 126 U/L - - 77  AST 15 - 41 U/L - - 41  ALT 0 - 44 U/L - - 16    Lab Results  Component Value Date   WBC 11.8 (H) 04/18/2019   HGB 11.6 (L) 04/18/2019   HCT 35.4 (L) 04/18/2019   MCV 91.7 04/18/2019   PLT 122 (L) 04/18/2019   NEUTROABS 9.5 (H) 04/16/2019    Ct Chest Wo Contrast  Result Date: 04/17/2019 CLINICAL DATA:  Pt s/p thoracentesis. "medical history significant of metastatic non-small cell lung cancer, with metastasis to bone, brain, right adrenal gland and malignant left pleural effusion presents with persistent nausea and vomiting since 3 a.m. today. Periumbilical pain  without radiation. Chronic cough and shortness of breath, similar to baseline. EXAM: CT CHEST WITHOUT CONTRAST TECHNIQUE: Multidetector CT imaging of the chest was performed following the standard protocol without IV contrast. COMPARISON:  CT of the chest on 03/24/2019 FINDINGS: Cardiovascular: Heart size is normal. There is atherosclerotic calcification of the coronary arteries. Normal noncontrast appearance of the pulmonary arteries. There is minimal  atherosclerosis of the thoracic aorta, not associated with aneurysm. Mediastinum/Nodes: The visualized portion of the thyroid gland has a normal appearance. Esophagus is normal in appearance. Enlarged LEFT supraclavicular lymph nodes, largest 1.4 centimeters. Previously, largest was 1.0 centimeters. Small mediastinal and hilar lymph nodes, none of which reach criteria for enlargement. Lungs/Pleura: There is no pneumothorax following thoracentesis. Persistent pleural collection identified posteriorly at the LEFT lung base. Although the study is not performed with intravenous contrast, suspect pleural thickening particularly at the LEFT lung base. Focal consolidations/mass in the LEFT lung apex is 1.6 x 2.2 centimeters, previously 2.3 x 1.8 centimeters. Extensive consolidations seen throughout the LEFT UPPER lobe with a extensive air bronchograms. Ground-glass opacities are identified in the LEFT LOWER lobe. The LEFT UPPER lobe opacities are more confluent when compared with the prior study. There is focal consolidations/atelectasis at the MEDIAL RIGHT lung base, associated with air bronchograms and new since the prior study. The RIGHT lung is hyperinflated. Upper Abdomen: Contrast identified within the gallbladder and collecting systems of the kidneys. Colonic diverticulosis is noted. The adrenal glands are normal in appearance. Musculoskeletal: Numerous blastic lesions within the thoracic and UPPER lumbar spine, ribs, and LEFT scapula. Stable compression fracture of T12. IMPRESSION: 1. No pneumothorax following thoracentesis. 2. Persistent pleural collection and pleural thickening at the LEFT lung base. 3. Interval development of focal consolidation RIGHT lung base. 4. More confluent opacity within the LEFT UPPER lobe. 5. Interval enlargement of LEFT supraclavicular lymph nodes, suspicious for metastatic disease. 6. Stable compression fracture of T12. Sclerotic osseous metastases. 7. Coronary artery disease. 8.  Colonic diverticulosis. 9.  Aortic atherosclerosis.  (ICD10-I70.0) Electronically Signed   By: Nolon Nations M.D.   On: 04/17/2019 12:31   Ct Chest W Contrast  Result Date: 03/24/2019 CLINICAL DATA:  Stage IV left upper lobe non-small cell lung cancer status post targeted chemotherapy, palliative radiotherapy to the brain, lung mass and bone metastases and left PleurX catheter placement. Restaging. EXAM: CT CHEST, ABDOMEN, AND PELVIS WITH CONTRAST TECHNIQUE: Multidetector CT imaging of the chest, abdomen and pelvis was performed following the standard protocol during bolus administration of intravenous contrast. CONTRAST:  73mL OMNIPAQUE IOHEXOL 300 MG/ML  SOLN COMPARISON:  01/28/2019 chest CT angiogram. 12/12/2018 PET-CT. 11/11/2018 chest CT. FINDINGS: CT CHEST FINDINGS Cardiovascular: Normal heart size. No significant pericardial effusion/thickening. Atherosclerotic nonaneurysmal thoracic aorta. Normal caliber pulmonary arteries. No central pulmonary emboli. Mediastinum/Nodes: No discrete thyroid nodules. Unremarkable esophagus. No axillary adenopathy. Multiple mildly enlarged left supraclavicular nodes, largest 1.2 cm (series 2/image 5), previously 1.0 cm on 01/27/2019 chest CT, minimally increased. No pathologically enlarged mediastinal or hilar nodes. Stable calcified left suprahilar 0.7 cm short axis node. Lungs/Pleura: No pneumothorax. Interval removal of left PleurX catheter. Small loculated dependent left pleural effusion with mildly irregular left pleural thickening, not definitely changed. No right pleural effusion. Improved aeration in the left upper lobe. Irregular nodular 2.3 x 1.8 cm focus of consolidation in the left upper lobe (series 5/image 42), previously obscured by atelectasis and consolidation on 01/27/2019 chest CT. Sharply marginated extensive left perihilar consolidation with associated extensive patchy ground-glass opacity throughout the left lung,  largely new in the interval. Mild  compressive atelectasis in the dependent left lower lobe. No acute consolidative airspace disease, lung masses or significant pulmonary nodules in the right lung. Musculoskeletal: There are widespread sclerotic osseous lesions throughout thoracic spine, bilateral ribs and bilateral scapula, which have significantly increased in sclerosis since 01/27/2019, for example in the anterior T8 vertebral body and in the upper left scapula (series 6/image 100). No definite new osseous lesions in the chest. Mild superior T12 vertebral compression fracture is new since 01/27/2019 chest CT. Moderate thoracic spondylosis. CT ABDOMEN PELVIS FINDINGS Hepatobiliary: Normal liver with no liver mass. Normal gallbladder with no radiopaque cholelithiasis. No biliary ductal dilatation. Pancreas: Low-attenuation 1.7 cm pancreatic neck lesion with coarse internal calcification (series 2/image 60), not appreciably changed since 11/11/2018 CT. No new pancreatic lesions. No pancreatic duct dilation. Spleen: Normal size. No mass. Adrenals/Urinary Tract: Normal adrenals. Normal kidneys with no hydronephrosis and no renal mass. Normal collapsed bladder. Stomach/Bowel: Small hiatal hernia. Otherwise normal nondistended stomach. Normal caliber small bowel with no small bowel wall thickening. Normal appendix. Oral contrast transits to the colon. Mild left colonic diverticulosis, with no large bowel wall thickening or significant pericolonic fat stranding. Vascular/Lymphatic: Atherosclerotic nonaneurysmal abdominal aorta. Patent portal, splenic, hepatic and renal veins. No pathologically enlarged lymph nodes in the abdomen or pelvis. Reproductive: Grossly normal uterus.  No adnexal mass. Other: No pneumoperitoneum, ascites or focal fluid collection. Musculoskeletal: There are widespread sclerotic osseous lesions throughout the lumbar spine, sacrum, bilateral iliac bones and proximal femora bilaterally, which are significantly increased in sclerosis  compared to the CT images from 12/12/2018 PET-CT study, correlating with the areas of skeletal hypermetabolism on that PET-CT study. No definite new focal osseous lesions. Mild superior L4 vertebral compression fracture is probably stable and chronic compared to the coronal reformats on 12/12/2018 PET-CT. Moderate lumbar spondylosis. IMPRESSION: 1. Widespread sclerotic osseous metastases throughout axial and proximal appendicular skeleton, significantly increased in sclerosis in the interval, a nonspecific change that probably reflects treatment effect. No definite new osseous lesions. 2. Mild left supraclavicular lymphadenopathy is slightly increased since 01/27/2019 chest CT angiogram study. 3. Aeration in the left upper lobe is improved in the interval. Irregular nodular focus of consolidation in the left upper lobe was previously obscured by consolidation/atelectasis, limiting comparison. New extensive sharply marginated perihilar consolidation and ground-glass opacity in the left lung. Findings are nonspecific but favored to represent evolving postradiation change. Close chest CT follow-up recommended. 4. Stable loculated small left pleural effusion with left pleural thickening. 5. Low-attenuation pancreatic neck lesion with internal coarse calcification is stable since 11/11/2018 CT, probably nonaggressive cystic pancreatic lesion. 6.  Aortic Atherosclerosis (ICD10-I70.0). Electronically Signed   By: Ilona Sorrel M.D.   On: 03/24/2019 17:48   Mr Jeri Cos UR Contrast  Result Date: 04/03/2019 CLINICAL DATA:  Restaging of non-small cell lung cancer. Two brain metastases treated with SRS on 12/30/2018. EXAM: MRI HEAD WITHOUT AND WITH CONTRAST TECHNIQUE: Multiplanar, multiecho pulse sequences of the brain and surrounding structures were obtained without and with intravenous contrast. CONTRAST:  62mL MULTIHANCE GADOBENATE DIMEGLUMINE 529 MG/ML IV SOLN COMPARISON:  12/25/2018 FINDINGS: BRAIN New Lesions: None.  Larger lesions: None. Stable or Smaller lesions: 1. Decreased size of 5 mm right frontoparietal lesion (series 11, image 111, previously 14 mm) with nearly completely resolved edema and new chronic blood products. 2. At most faint residual enhancement corresponding to the suspected 2 mm left frontal metastasis on the prior study (series 11, image 87 and series 13, image  27) Other Brain findings: There is no evidence of acute infarct, midline shift, or extra-axial fluid collection. The ventricles and sulci are within normal limits. No age advanced cerebral white matter disease is evident. Vascular: Major intracranial vascular flow voids are preserved. Skull and upper cervical spine: Increased size of multiple skull metastases without significant extraosseous extension. Sinuses/Orbits: Unremarkable orbits. Clear paranasal sinuses. Moderate right mastoid effusion. Other: None. IMPRESSION: 1. Decreased size of the 2 treated brain metastases. 2. No evidence of new brain metastases or acute intracranial abnormality. 3. Interval enlargement of multiple skull metastases. Electronically Signed   By: Logan Bores M.D.   On: 04/03/2019 15:16   Ct Abdomen Pelvis W Contrast  Result Date: 04/16/2019 CLINICAL DATA:  Acute abdominal pain. Nausea and vomiting. Neutropenia. EXAM: CT ABDOMEN AND PELVIS WITH CONTRAST TECHNIQUE: Multidetector CT imaging of the abdomen and pelvis was performed using the standard protocol following bolus administration of intravenous contrast. CONTRAST:  18mL OMNIPAQUE IOHEXOL 300 MG/ML  SOLN COMPARISON:  Most recent imaging CT 03/24/2019, PET CT 12/12/2018 FINDINGS: Lower chest: Left pleural effusion with enhancement, similar to slightly decreased from prior exam. Unchanged compressive atelectasis and ground-glass opacities throughout the left lung base. New linear consolidation with air bronchograms in the medial right lower lobe. Hepatobiliary: Hepatic steatosis. No focal lesion. Gallbladder  physiologically distended, no calcified stone. No biliary dilatation. Pancreas: 17 mm low-density lesion in the pancreatic neck with central quest occasion, unchanged parenchymal atrophy. No ductal dilatation or inflammation. Spleen: Multiple wedge-shaped low-density defects primarily in the mid lower spleen consistent with infarcts. No perisplenic fluid. Adrenals/Urinary Tract: No adrenal nodule. No hydronephrosis or perinephric edema. Small slightly wedge-shaped defect of the superior right kidney, best appreciated on sagittal image 57 series 6, not present on prior exam. Symmetric excretion on delayed phase imaging. Urinary bladder is physiologically distended without wall thickening. Stomach/Bowel: Small hiatal hernia. Stomach physiologically distended. No bowel wall thickening, inflammatory change, or obstruction. Mild colonic diverticulosis without diverticulitis. Normal appendix. Vascular/Lymphatic: Calcified aortic atherosclerosis. No aneurysm. Patent portal vein and mesenteric vessels. No enlarged lymph nodes in the abdomen or pelvis. Reproductive: Uterus and bilateral adnexa are unremarkable. Other: No ascites. No free air or intra-abdominal fluid collection. Musculoskeletal: Known multifocal sclerotic metastatic disease which appears similar to prior exam. L4 and T12 compression fractures are unchanged from prior. Mild central depression of superior L3 may represent interval mild pathologic compression fracture. IMPRESSION: 1. Multiple splenic infarcts, new from prior exam. 2. Small wedge-shaped defect in the superior right kidney, new from prior exam. Given splenic infarcts, small infarct is favored over pyelonephritis. 3. Unchanged cystic lesion in the pancreatic neck with internal calcification. 4. Mild central depression of superior L3 is new and may reflect interval mild pathologic compression fracture. Unchanged multifocal sclerotic osseous metastatic disease. Mild L4 and T12 compression fractures  are unchanged. 5. Linear consolidation in the medial right lower lobe, not seen previously, this may represent postradiation change versus pneumonia. 6. Chronic partially loculated left pleural effusion. 7. Colonic diverticulosis without diverticulitis. Aortic Atherosclerosis (ICD10-I70.0). Electronically Signed   By: Keith Rake M.D.   On: 04/16/2019 23:52   Ct Abdomen Pelvis W Contrast  Result Date: 03/24/2019 CLINICAL DATA:  Stage IV left upper lobe non-small cell lung cancer status post targeted chemotherapy, palliative radiotherapy to the brain, lung mass and bone metastases and left PleurX catheter placement. Restaging. EXAM: CT CHEST, ABDOMEN, AND PELVIS WITH CONTRAST TECHNIQUE: Multidetector CT imaging of the chest, abdomen and pelvis was performed following the standard protocol  during bolus administration of intravenous contrast. CONTRAST:  74mL OMNIPAQUE IOHEXOL 300 MG/ML  SOLN COMPARISON:  01/28/2019 chest CT angiogram. 12/12/2018 PET-CT. 11/11/2018 chest CT. FINDINGS: CT CHEST FINDINGS Cardiovascular: Normal heart size. No significant pericardial effusion/thickening. Atherosclerotic nonaneurysmal thoracic aorta. Normal caliber pulmonary arteries. No central pulmonary emboli. Mediastinum/Nodes: No discrete thyroid nodules. Unremarkable esophagus. No axillary adenopathy. Multiple mildly enlarged left supraclavicular nodes, largest 1.2 cm (series 2/image 5), previously 1.0 cm on 01/27/2019 chest CT, minimally increased. No pathologically enlarged mediastinal or hilar nodes. Stable calcified left suprahilar 0.7 cm short axis node. Lungs/Pleura: No pneumothorax. Interval removal of left PleurX catheter. Small loculated dependent left pleural effusion with mildly irregular left pleural thickening, not definitely changed. No right pleural effusion. Improved aeration in the left upper lobe. Irregular nodular 2.3 x 1.8 cm focus of consolidation in the left upper lobe (series 5/image 42), previously  obscured by atelectasis and consolidation on 01/27/2019 chest CT. Sharply marginated extensive left perihilar consolidation with associated extensive patchy ground-glass opacity throughout the left lung, largely new in the interval. Mild compressive atelectasis in the dependent left lower lobe. No acute consolidative airspace disease, lung masses or significant pulmonary nodules in the right lung. Musculoskeletal: There are widespread sclerotic osseous lesions throughout thoracic spine, bilateral ribs and bilateral scapula, which have significantly increased in sclerosis since 01/27/2019, for example in the anterior T8 vertebral body and in the upper left scapula (series 6/image 100). No definite new osseous lesions in the chest. Mild superior T12 vertebral compression fracture is new since 01/27/2019 chest CT. Moderate thoracic spondylosis. CT ABDOMEN PELVIS FINDINGS Hepatobiliary: Normal liver with no liver mass. Normal gallbladder with no radiopaque cholelithiasis. No biliary ductal dilatation. Pancreas: Low-attenuation 1.7 cm pancreatic neck lesion with coarse internal calcification (series 2/image 60), not appreciably changed since 11/11/2018 CT. No new pancreatic lesions. No pancreatic duct dilation. Spleen: Normal size. No mass. Adrenals/Urinary Tract: Normal adrenals. Normal kidneys with no hydronephrosis and no renal mass. Normal collapsed bladder. Stomach/Bowel: Small hiatal hernia. Otherwise normal nondistended stomach. Normal caliber small bowel with no small bowel wall thickening. Normal appendix. Oral contrast transits to the colon. Mild left colonic diverticulosis, with no large bowel wall thickening or significant pericolonic fat stranding. Vascular/Lymphatic: Atherosclerotic nonaneurysmal abdominal aorta. Patent portal, splenic, hepatic and renal veins. No pathologically enlarged lymph nodes in the abdomen or pelvis. Reproductive: Grossly normal uterus.  No adnexal mass. Other: No pneumoperitoneum,  ascites or focal fluid collection. Musculoskeletal: There are widespread sclerotic osseous lesions throughout the lumbar spine, sacrum, bilateral iliac bones and proximal femora bilaterally, which are significantly increased in sclerosis compared to the CT images from 12/12/2018 PET-CT study, correlating with the areas of skeletal hypermetabolism on that PET-CT study. No definite new focal osseous lesions. Mild superior L4 vertebral compression fracture is probably stable and chronic compared to the coronal reformats on 12/12/2018 PET-CT. Moderate lumbar spondylosis. IMPRESSION: 1. Widespread sclerotic osseous metastases throughout axial and proximal appendicular skeleton, significantly increased in sclerosis in the interval, a nonspecific change that probably reflects treatment effect. No definite new osseous lesions. 2. Mild left supraclavicular lymphadenopathy is slightly increased since 01/27/2019 chest CT angiogram study. 3. Aeration in the left upper lobe is improved in the interval. Irregular nodular focus of consolidation in the left upper lobe was previously obscured by consolidation/atelectasis, limiting comparison. New extensive sharply marginated perihilar consolidation and ground-glass opacity in the left lung. Findings are nonspecific but favored to represent evolving postradiation change. Close chest CT follow-up recommended. 4. Stable loculated small left pleural  effusion with left pleural thickening. 5. Low-attenuation pancreatic neck lesion with internal coarse calcification is stable since 11/11/2018 CT, probably nonaggressive cystic pancreatic lesion. 6.  Aortic Atherosclerosis (ICD10-I70.0). Electronically Signed   By: Ilona Sorrel M.D.   On: 03/24/2019 17:48   Dg Chest Port 1 View  Result Date: 04/18/2019 CLINICAL DATA:  Pleural effusion. EXAM: PORTABLE CHEST 1 VIEW COMPARISON:  April 17, 2019 FINDINGS: Again noted is extensive consolidation throughout the left lung field. There is no  pneumothorax. There is a probable small left-sided pleural effusion. The mediastinum is shifted to the left consistent with left-sided volume loss. The right lung field is essentially clear. IMPRESSION: No significant interval change. Electronically Signed   By: Constance Holster M.D.   On: 04/18/2019 22:37   Dg Chest Portable 1 View  Result Date: 04/17/2019 CLINICAL DATA:  Shortness of breath. History of lung cancer. Questionable pna on CT EXAM: PORTABLE CHEST 1 VIEW COMPARISON:  Chest CT 03/24/2019. Lung bases from abdominal CT earlier this day. FINDINGS: Increased volume loss in left hemithorax with left perihilar opacity from prior chest CT. Left pleural effusion. Grossly unchanged heart size and mediastinal contours, left heart border obscured. No pulmonary edema. The consolidation in the medial right lower lobe on CT is not seen radiographically. Right lung is otherwise clear. IMPRESSION: 1. Increased volume loss in the left hemithorax and perihilar density since prior CT. Findings may be due to post treatment related or postobstructive change, patient with known left lung cancer. Left pleural effusion. 2. The right lung base consolidation on abdominal CT earlier today is not visualized radiographically. Electronically Signed   By: Keith Rake M.D.   On: 04/17/2019 01:36   US Thoracentesis Asp Pleural Space W/img Guide  Result Date: 04/17/2019 INDICATION: Patient with history of metastatic lung cancer, recurrent malignant left pleural effusion; request made for diagnostic and therapeutic left thoracentesis. EXAM: ULTRASOUND GUIDED DIAGNOSTIC AND THERAPEUTIC LEFT THORACENTESIS MEDICATIONS: None COMPLICATIONS: None immediate. PROCEDURE: An ultrasound guided thoracentesis was thoroughly discussed with the patient and questions answered. The benefits, risks, alternatives and complications were also discussed. The patient understands and wishes to proceed with the procedure. Written consent was  obtained. Ultrasound was performed to localize and mark an adequate pocket of fluid in the left chest. The area was then prepped and draped in the normal sterile fashion. 1% Lidocaine was used for local anesthesia. Under ultrasound guidance a 6 Fr Safe-T-Centesis catheter was introduced. Thoracentesis was performed. The catheter was removed and a dressing applied. FINDINGS: A total of approximately 100 cc of amber/blood-tinged fluid was removed. Samples were sent to the laboratory as requested by the clinical team. The amount of free pleural fluid was small on today's ultrasound and multiloculated. IMPRESSION: Successful ultrasound guided diagnostic and therapeutic left thoracentesis yielding 100 cc of pleural fluid. Read by: Rowe Robert, PA-C Electronically Signed   By: Jerilynn Mages.  Shick M.D.   On: 04/17/2019 11:56    ASSESSMENT AND PLAN: 1.  Stage IV non-small cell lung cancer, adenocarcinoma 2.  Splenic infarct/right renal infarct 3.  History of pulmonary embolism 4.  Recurrent left pleural effusion 5.  Nausea and vomiting; improved 6.  Right lower lobe pneumonia versus aspiration versus radiation changes  -Continue Tagrisso 80 mg daily. -Continue Lovenox 1.5 mg/kg daily.  She will need to continue this medication upon discharge. -Status post thoracentesis with only 100 cc of fluid removed.  Repeat thoracentesis as needed. -Continue antiemetics as needed. -For possible pneumonia, continue antibiotics per hospitalist.  Sputum  culture showed few gram variable rod and rare gram-positive cocci in pairs.  Blood cultures are negative to date.  Please contact Medical Oncology for questions.    LOS: 2 days   Mikey Bussing, DNP, AGPCNP-BC, AOCNP 04/19/19

## 2019-04-19 NOTE — Progress Notes (Addendum)
PROGRESS NOTE  Tina Cruz VEH:[id number] DOB: 26-Dec-1960 DOA: 04/16/2019 PCP: [name], MD  HPI/Recap of past 42 hours: 58 year old female with PMH of stage IV non-small cell lung cancer, adenocarcinoma with metastasis to bone, brain, right adrenal gland, malignant left pleural effusion, GERD, hypothyroid, presented with intractable nausea, nonbloody emesis that began on the afternoon of admission, associated with periumbilical abdominal pain which she thinks may be related to her nausea and vomiting, chronic cough and dyspnea but now with productive cough and dyspnea on minimal exertion, inability to keep anything down.  She was admitted for intractable nausea, vomiting, abdominal pain, splenic and right renal infarcts, suspected community-acquired pneumonia versus aspiration pneumonia and recurrent left malignant pleural effusion.  IR and oncology were consulted.  S/p 100 mL ultrasound-guided left thoracentesis.  04/19/19: Patient seen and examined at her bedside.  She reports productive cough of yellow mucus.  Pleuritic pain worse with coughing.  Started Tussionex.  Hypoxic on room air with O2 saturation 85%.  States she is not on home O2 supplementation.   Assessment/Plan: Principal Problem:   Pneumonia Active Problems:   GERD (gastroesophageal reflux disease)   Adenocarcinoma of left lung, stage 4 (HCC)   Recurrent left pleural effusion   Bone metastases (HCC)   Brain metastasis (HCC)   Splenic infarct   Lumbar compression fracture (HCC)   Renal infarct (HCC)   Abdominal pain   Nausea & vomiting  Intractable nausea, vomiting and abdominal pain Nausea is improving with antiemetics Suspect secondary to splenic and right renal infarct Continue supportive care No sign of obstruction on CT abdomen and pelvis  Acute hypoxic respiratory failure likely multifactorial secondary to stage IV lung cancer versus radiation changes versus community-acquired pneumonia O2  saturation 85% on room air Maintain O2 saturation greater than 92% Continue inhalers Patient is allergic to albuterol Independently reviewed chest x-ray done on 04/17/2017 which showed extensive consolidation throughout the left lung field.  Right lung field is essentially clear. Home O2 evaluation prior to discharge  Community-acquired pneumonia versus post radiation changes Continue azithromycin and Rocephin Procalcitonin 0.14 on 04/19/2019 Monitor fever curve and WBC  Intractable cough in the setting of Lung cancer Started Tussionex twice daily Mucinex Twice daily  Splenic and right renal infarct in the setting of stage IV lung cancer on Eliquis Reports compliance with Eliquis Oncology recommends subcu Lovenox full dose On subcu Lovenox 100 mg daily started on 04/18/2019 No valvular lesions on TTE  Recurrent malignant L pleural affusion Post left thoracentesis with 100 cc of bloody pleural fluid removed Cytology is pending  L3 compression fracture Likely pathologic compression fracture in the setting of stage IV lung cancer with bone mets  Stage IV non-small cell lung cancer, adenocarcinoma with brain mets  Oncology consultation appreciated.  Continue treatment with Tagrisso, patient will have to bring her home supplies.  Patient reportedly has an appointment with Dr. Isidore Moos, Radiation oncology on 7/7, please contact them in a.m.  I reviewed patient's recent MRI brain 04/03/2019 results with her spouse, 2 intracranial metastasis are decreasing in size but skull metastasis are enlarging.  Per oncology her treated brain mets responded well to the radiation treatment and there are no new lesions.  The plan is to repeat a brain MRI in 3 months and follow-up with neurosurgeon.  History of pulmonary embolism  Home Eliquis held for thoracentesis, may resume anticoagulation on 7/7, oncology discussing Lovenox if patient agreeable.  Prolonged QTC  QTC improved from 497 on admission  EKG  to 475 on 7/6.  Minimize QT prolonging medications.  Monitor EKG periodically.  Physical debility/ambulatory dysfunction PT OT assessed and recommended home health PT OT Continue physical therapy Fall precautions    DVT prophylaxis:  Full dose subcu Lovenox Code Status: Full Family Communication:  Will call family if okay with the patient.    Disposition:  DC to home with home health services possibly in 2 to 3 days when clinically stable.   Consultants:  Medical oncology IR  Procedures:  Ultrasound-guided left thoracentesis by IR 7/6.  Antimicrobials:  IV ceftriaxone and azithromycin  Objective: Vitals:   04/18/19 1400 04/18/19 2032 04/19/19 0546 04/19/19 0830  BP: 109/71 119/71 136/64   Pulse: (!) 105 (!) 105 (!) 115 (!) 107  Resp: 18 16 20    Temp: 99.7 F (37.6 C) 98.9 F (37.2 C) 98.4 F (36.9 C)   TempSrc: Oral Oral Oral   SpO2: 100% 93% 94% 94%  Weight:      Height:        Intake/Output Summary (Last 24 hours) at 04/19/2019 1034 Last data filed at 04/19/2019 0600 Gross per 24 hour  Intake 1823.27 ml  Output 0 ml  Net 1823.27 ml   Filed Weights   04/16/19 2043 04/17/19 0028 04/17/19 0433  Weight: 70.3 kg 68 kg 67.2 kg    Exam:  . General: 58 y.o. year-old female well-developed well-nourished in no acute distress.  Alert and oriented x3.   . Cardiovascular: Regular rate and rhythm with no rubs or gallops.  No JVD or thyromegaly noted.   Marland Kitchen Respiratory: Fine rales at bases with no wheezes noted.  Poor inspiratory effort. . Abdomen: Soft nontender nondistended with normal bowel sounds times 4 quadrants. . Musculoskeletal: Trace lower extremity edema.  ~4 pulses in all 4 extremities. Marland Kitchen Psychiatry: Mood is appropriate condition and setting.   Data Reviewed: CBC: Recent Labs  Lab 04/16/19 2103 04/17/19 0448 04/18/19 0425  WBC 10.8* 10.6* 11.8*  NEUTROABS 9.5*  --   --   HGB 13.1 12.6 11.6*  HCT 39.0 37.7 35.4*  MCV 88.4 89.1 91.7  PLT  171 155 734*   Basic Metabolic Panel: Recent Labs  Lab 04/16/19 2103 04/17/19 0448 04/18/19 0425  NA 142 140 137  K 3.8 3.8 4.4  CL 105 105 106  CO2 22 24 22   GLUCOSE 128* 134* 118*  BUN 14 14 11   CREATININE 0.69 0.64 0.62  CALCIUM 9.5 9.2 8.8*  MG  --  2.2  --    GFR: Estimated Creatinine Clearance: 75.4 mL/min (by C-G formula based on SCr of 0.62 mg/dL). Liver Function Tests: Recent Labs  Lab 04/16/19 2103  AST 41  ALT 16  ALKPHOS 77  BILITOT 0.8  PROT 7.1  ALBUMIN 3.7   Recent Labs  Lab 04/16/19 2103  LIPASE 31   No results for input(s): AMMONIA in the last 168 hours. Coagulation Profile: Recent Labs  Lab 04/17/19 0448  INR 1.4*   Cardiac Enzymes: No results for input(s): CKTOTAL, CKMB, CKMBINDEX, TROPONINI in the last 168 hours. BNP (last 3 results) No results for input(s): PROBNP in the last 8760 hours. HbA1C: No results for input(s): HGBA1C in the last 72 hours. CBG: Recent Labs  Lab 04/17/19 0507  GLUCAP 119*   Lipid Profile: No results for input(s): CHOL, HDL, LDLCALC, TRIG, CHOLHDL, LDLDIRECT in the last 72 hours. Thyroid Function Tests: Recent Labs    04/17/19 0448  TSH 1.054   Anemia Panel: No results for input(s):  VITAMINB12, FOLATE, FERRITIN, TIBC, IRON, RETICCTPCT in the last 72 hours. Urine analysis:    Component Value Date/Time   COLORURINE YELLOW 04/16/2019 2103   APPEARANCEUR HAZY (A) 04/16/2019 2103   LABSPEC 1.017 04/16/2019 2103   PHURINE 7.0 04/16/2019 2103   GLUCOSEU NEGATIVE 04/16/2019 2103   HGBUR MODERATE (A) 04/16/2019 2103   BILIRUBINUR NEGATIVE 04/16/2019 2103   KETONESUR 80 (A) 04/16/2019 2103   PROTEINUR 30 (A) 04/16/2019 2103   NITRITE NEGATIVE 04/16/2019 2103   LEUKOCYTESUR TRACE (A) 04/16/2019 2103   Sepsis Labs: @LABRCNTIP (procalcitonin:4,lacticidven:4)  ) Recent Results (from the past 240 hour(s))  Blood culture (routine x 2)     Status: None (Preliminary result)   Collection Time: 04/16/19  9:03  PM   Specimen: BLOOD  Result Value Ref Range Status   Specimen Description   Final    BLOOD Performed at [facility], [place] [address] [place], [place] [address]    Special Requests   Final    BOTTLES DRAWN AEROBIC AND ANAEROBIC Blood Culture adequate volume Performed at [place] [address] [place], [place] [address]    Culture   Final    NO GROWTH 1 DAY Performed at [facility], [place] [address] [place], [place] [address]    Report Status PENDING  Incomplete  SARS Coronavirus 2 (CEPHEID - Performed in [place] hospital lab), Hosp Order     Status: None   Collection Time: 04/17/19 12:22 AM   Specimen: Urine, Clean Catch; Nasopharyngeal  Result Value Ref Range Status   SARS Coronavirus 2 NEGATIVE NEGATIVE Final    Comment: (NOTE) If result is NEGATIVE SARS-CoV-2 target nucleic acids are NOT DETECTED. The SARS-CoV-2 RNA is generally detectable in upper and lower  respiratory specimens during the acute phase of infection. The lowest  concentration of SARS-CoV-2 viral copies this assay can detect is 250  copies / mL. A negative result does not preclude SARS-CoV-2 infection  and should not be used as the sole basis for treatment or other  patient management decisions.  A negative result may occur with  improper specimen collection / handling, submission of specimen other  than nasopharyngeal swab, presence of viral mutation(s) within the  areas targeted by this assay, and inadequate number of viral copies  (<250 copies / mL). A negative result must be combined with clinical  observations, patient history, and epidemiological information. If result is POSITIVE SARS-CoV-2 target nucleic acids are DETECTED. The SARS-CoV-2 RNA is generally detectable in upper and lower  respiratory specimens dur ing the acute phase of infection.  Positive  results are indicative of active infection with SARS-CoV-2.  Clinical  correlation with  patient history and other diagnostic information is  necessary to determine patient infection status.  Positive results do  not rule out bacterial infection or co-infection with other viruses. If result is PRESUMPTIVE POSTIVE SARS-CoV-2 nucleic acids MAY BE PRESENT.   A presumptive positive result was obtained on the submitted specimen  and confirmed on repeat testing.  While 2019 novel coronavirus  (SARS-CoV-2) nucleic acids may be present in the submitted sample  additional confirmatory testing may be necessary for epidemiological  and / or clinical management purposes  to differentiate between  SARS-CoV-2 and other Sarbecovirus currently known to infect humans.  If clinically indicated additional testing with an alternate test  methodology [phone]) is advised. The SARS-CoV-2 RNA is generally  detectable in upper and lower respiratory sp ecimens during the acute  phase  of infection. The expected result is Negative. Fact Sheet for Patients:  StrictlyIdeas.no Fact Sheet for Healthcare Providers: BankingDealers.co.za This test is not yet approved or cleared by the [place] FDA and has been authorized for detection and/or diagnosis of SARS-CoV-2 by FDA under an Emergency Use Authorization (EUA).  This EUA will remain in effect (meaning this test can be used) for the duration of the COVID-19 declaration under Section 564(b)(1) of the Act, 21 U.S.C. section 360bbb-3(b)(1), unless the authorization is terminated or revoked sooner. Performed at [facility], [place] [address] [place], [place] [address]   Urine culture     Status: None   Collection Time: 04/17/19 12:55 AM   Specimen: Urine, Random  Result Value Ref Range Status   Specimen Description   Final    URINE, RANDOM Performed at [place] [address], [place], [place] [address]    Special Requests   Final    NONE Performed at  [facility], [place] [address] [place], [place] [address]    Culture   Final    NO GROWTH Performed at [facility], [place] [address] [place], [place] [address]    Report Status 04/18/2019 FINAL  Final  MRSA PCR Screening     Status: None   Collection Time: 04/17/19  4:39 AM   Specimen: Nasal Mucosa; Nasopharyngeal  Result Value Ref Range Status   MRSA by PCR NEGATIVE NEGATIVE Final    Comment:        The GeneXpert MRSA Assay (FDA approved for NASAL specimens only), is one component of a comprehensive MRSA colonization surveillance program. It is not intended to diagnose MRSA infection nor to guide or monitor treatment for MRSA infections. Performed at [facility], [place] [address] [place], [place] [address]   Blood culture (routine x 2)     Status: None (Preliminary result)   Collection Time: 04/17/19  4:48 AM   Specimen: BLOOD  Result Value Ref Range Status   Specimen Description   Final    BLOOD RIGHT ANTECUBITAL Performed at [place] [address] [place], [place] [address]    Special Requests   Final    BOTTLES DRAWN AEROBIC ONLY Blood Culture adequate volume Performed at [place] [address] [place], [place] [address]    Culture   Final    NO GROWTH 1 DAY Performed at [facility], [place] [address] [place], [place] [address]    Report Status PENDING  Incomplete  Body fluid culture     Status: None (Preliminary result)   Collection Time: 04/17/19 12:03 PM   Specimen: Pleural, Left; Pleural Fluid  Result Value Ref Range Status   Specimen Description   Final    PLEURAL Performed at [facility], [place] [address], [place], [place] [address]    Special Requests   Final    NONE Performed at [facility], [place] [address] [place], [place] [address]    Gram Stain   Final    RARE WBC PRESENT, PREDOMINANTLY MONONUCLEAR NO ORGANISMS SEEN     Culture   Final    NO GROWTH 2 DAYS Performed at [facility], [place] [address] [place],  [address]    Report Status PENDING  Incomplete  Sputum culture     Status: None   Collection Time: 04/18/19  8:00 AM   Specimen: Sputum  Result Value Ref  Range Status   Specimen Description SPUTUM  Final   Special Requests NONE  Final   Sputum evaluation   Final    THIS SPECIMEN IS ACCEPTABLE FOR SPUTUM CULTURE Performed at [facility], [place] [address], [place], [place] [address]    Report Status 04/18/2019 FINAL  Final  Culture, respiratory     Status: None (Preliminary result)   Collection Time: 04/18/19  8:00 AM   Specimen: SPU  Result Value Ref Range Status   Specimen Description   Final    SPUTUM Performed at [place] [address], [place], [place] [address]    Special Requests   Final    NONE Reflexed from [id number] Performed at [facility], [place] [address] [place], [place] [address]    Gram Stain   Final    FEW WBC PRESENT,BOTH PMN AND MONONUCLEAR FEW GRAM VARIABLE ROD RARE GRAM POSITIVE COCCI IN PAIRS    Culture   Final    CULTURE REINCUBATED FOR BETTER GROWTH Performed at [facility], [place] [address], [place], [place] [address]    Report Status PENDING  Incomplete      Studies: Dg Chest Port 1 View  Result Date: 04/18/2019 CLINICAL DATA:  Pleural effusion. EXAM: PORTABLE CHEST 1 VIEW COMPARISON:  April 17, 2019 FINDINGS: Again noted is extensive consolidation throughout the left lung field. There is no pneumothorax. There is a probable small left-sided pleural effusion. The mediastinum is shifted to the left consistent with left-sided volume loss. The right lung field is essentially clear. IMPRESSION: No significant interval change. Electronically Signed   By: Constance Holster M.D.   On: 04/18/2019 22:37    Scheduled Meds: . chlorpheniramine-HYDROcodone  5 mL Oral Q12H  . enoxaparin (LOVENOX)  injection  1.5 mg/kg Subcutaneous Q24H  . feeding supplement  1 Container Oral TID BM  . guaiFENesin  1,200 mg Oral BID  . levothyroxine  112 mcg Oral Q0600  . loratadine  10 mg Oral Daily  . montelukast  10 mg Oral QHS  . multivitamin with minerals  1 tablet Oral Daily  . osimertinib mesylate  80 mg Oral Daily  . pantoprazole (PROTONIX) IV  40 mg Intravenous Q24H    Continuous Infusions: . 0.9 % NaCl with KCl 40 mEq / L 75 mL/hr (04/19/19 0144)  . azithromycin 500 mg (04/19/19 0655)  . cefTRIAXone (ROCEPHIN)  IV 2 g (04/19/19 0555)     LOS: 2 days     Kayleen Memos, MD [facility] Pager [phone]  If 7PM-7AM, please contact night-coverage [email or web address] Password [facility] 04/19/2019, 10:34 AM

## 2019-04-20 ENCOUNTER — Inpatient Hospital Stay: Payer: Managed Care, Other (non HMO) | Attending: Internal Medicine | Admitting: Internal Medicine

## 2019-04-20 ENCOUNTER — Inpatient Hospital Stay: Payer: Managed Care, Other (non HMO)

## 2019-04-20 DIAGNOSIS — J910 Malignant pleural effusion: Secondary | ICD-10-CM | POA: Insufficient documentation

## 2019-04-20 DIAGNOSIS — F329 Major depressive disorder, single episode, unspecified: Secondary | ICD-10-CM | POA: Insufficient documentation

## 2019-04-20 DIAGNOSIS — K449 Diaphragmatic hernia without obstruction or gangrene: Secondary | ICD-10-CM | POA: Insufficient documentation

## 2019-04-20 DIAGNOSIS — C7951 Secondary malignant neoplasm of bone: Secondary | ICD-10-CM | POA: Insufficient documentation

## 2019-04-20 DIAGNOSIS — Z7901 Long term (current) use of anticoagulants: Secondary | ICD-10-CM | POA: Insufficient documentation

## 2019-04-20 DIAGNOSIS — K219 Gastro-esophageal reflux disease without esophagitis: Secondary | ICD-10-CM | POA: Insufficient documentation

## 2019-04-20 DIAGNOSIS — I700 Atherosclerosis of aorta: Secondary | ICD-10-CM | POA: Insufficient documentation

## 2019-04-20 DIAGNOSIS — C7931 Secondary malignant neoplasm of brain: Secondary | ICD-10-CM | POA: Insufficient documentation

## 2019-04-20 DIAGNOSIS — I2510 Atherosclerotic heart disease of native coronary artery without angina pectoris: Secondary | ICD-10-CM | POA: Insufficient documentation

## 2019-04-20 DIAGNOSIS — C782 Secondary malignant neoplasm of pleura: Secondary | ICD-10-CM | POA: Insufficient documentation

## 2019-04-20 DIAGNOSIS — E039 Hypothyroidism, unspecified: Secondary | ICD-10-CM | POA: Insufficient documentation

## 2019-04-20 DIAGNOSIS — C7971 Secondary malignant neoplasm of right adrenal gland: Secondary | ICD-10-CM | POA: Insufficient documentation

## 2019-04-20 DIAGNOSIS — Z79899 Other long term (current) drug therapy: Secondary | ICD-10-CM | POA: Insufficient documentation

## 2019-04-20 DIAGNOSIS — K760 Fatty (change of) liver, not elsewhere classified: Secondary | ICD-10-CM | POA: Insufficient documentation

## 2019-04-20 DIAGNOSIS — C3412 Malignant neoplasm of upper lobe, left bronchus or lung: Secondary | ICD-10-CM | POA: Insufficient documentation

## 2019-04-20 DIAGNOSIS — K5730 Diverticulosis of large intestine without perforation or abscess without bleeding: Secondary | ICD-10-CM | POA: Insufficient documentation

## 2019-04-20 DIAGNOSIS — C778 Secondary and unspecified malignant neoplasm of lymph nodes of multiple regions: Secondary | ICD-10-CM | POA: Insufficient documentation

## 2019-04-20 LAB — BODY FLUID CULTURE: Culture: NO GROWTH

## 2019-04-20 MED ORDER — BENZONATATE 100 MG PO CAPS
100.0000 mg | ORAL_CAPSULE | Freq: Two times a day (BID) | ORAL | Status: DC
  Administered 2019-04-20 – 2019-04-23 (×7): 100 mg via ORAL
  Filled 2019-04-20 (×8): qty 1

## 2019-04-20 NOTE — Plan of Care (Signed)
  Problem: Education: Goal: Knowledge of General Education information will improve Description: Including pain rating scale, medication(s)/side effects and non-pharmacologic comfort measures Outcome: Progressing   Problem: Clinical Measurements: Goal: Ability to maintain clinical measurements within normal limits will improve Outcome: Progressing   Problem: Clinical Measurements: Goal: Will remain free from infection Outcome: Progressing   Problem: Clinical Measurements: Goal: Diagnostic test results will improve Outcome: Progressing   Problem: Clinical Measurements: Goal: Cardiovascular complication will be avoided Outcome: Progressing   

## 2019-04-20 NOTE — TOC Initial Note (Signed)
Transition of Care [facility]) - Initial/Assessment Note    Patient Details  Name: Tina Cruz MRN: [id number] Date of Birth: 1961-07-03  Transition of Care [facility]) CM/SW Contact:    Nila Nephew, LCSW Phone Number: [phone] 04/20/2019, 11:11 AM  Clinical Narrative:   Pt admitted with pneumonia, is patient of [place] with stage IV lung cancer with bone and brain mets.  Discussed recommendation for home health with pt's husband via phone. Pt was using home o2 prior to admission, was "not moving around a lot but could get out of bed, to the kitchen and to the bathroom." was not using DME. Open to [place] however has reservations due to having someone come in home given [place] in community. CSW validated concerns and pt agreed to have CSW contact [place] to find out of Home Health covered under pt's policy and if there are providers in area.  Left voicemail with [facility] and will follow up.                 Activities of Daily Living Home Assistive Devices/Equipment: None ADL Screening (condition at time of admission) Patient's cognitive ability adequate to safely complete daily activities?: No Is the patient deaf or have difficulty hearing?: No Does the patient have difficulty seeing, even when wearing glasses/contacts?: No Does the patient have difficulty concentrating, remembering, or making decisions?: No Patient able to express need for assistance with ADLs?: Yes Does the patient have difficulty dressing or bathing?: No Independently performs ADLs?: Yes (appropriate for developmental age) Does the patient have difficulty walking or climbing stairs?: Yes Weakness of Legs: Both Weakness of Arms/Hands: Both     Admission diagnosis:  Generalized abdominal pain [R10.84] QT prolongation [R94.31] Sepsis without acute organ dysfunction, due to unspecified organism [facility]) [A41.9] Patient Active Problem List   Diagnosis Date Noted  . Pneumonia 04/17/2019   . Splenic infarct 04/17/2019  . Lumbar compression fracture ([place]) 04/17/2019  . Renal infarct ([place]) 04/17/2019  . Abdominal pain 04/17/2019  . Nausea & vomiting 04/17/2019  . Protein-calorie malnutrition ([place]) 02/13/2019  . Hives   . Itching   . Acute respiratory failure with hypoxia ([place]) 01/27/2019  . Encounter for antineoplastic chemotherapy 01/09/2019  . Pulmonary embolism ([place]) 01/06/2019  . Adenocarcinoma of left lung, stage 4 ([place]) 12/14/2018  . Recurrent left pleural effusion 12/14/2018  . Bone metastases ([place]) 12/14/2018  . Brain metastasis ([place]) 12/14/2018  . Goals of care, counseling/discussion 12/14/2018  . Cancer associated pain 12/14/2018  . Allergic rhinitis 07/27/2018  . Hemangioma 03/11/2017  . Seborrheic keratosis 03/11/2017  . Skin tag 03/11/2017  . GERD (gastroesophageal reflux disease) 01/14/2015  . Thyroid disease    PCP:  [name], MD Pharmacy:   [facility], [place] [address] Phone: [phone] Fax: [phone]  [facility] [place], [place] [address] Phone: [phone] Fax: [phone]  [id number] [place], [place] - [address] [place] [address] Phone: [phone] Fax: [phone]     Social Determinants of Health (SDOH) Interventions    Readmission Risk Interventions Readmission Risk Prevention Plan 04/20/2019  Transportation Screening Complete  PCP or Specialist Appt within 3-5 Days Not Complete  Not Complete comments Pt established with providers however DC date unknown  [place] or Home Care  Consult Complete  Social Work Consult for [place] Planning/Counseling Complete  Palliative Care Screening Not Complete  Palliative Care Screening Not Complete Comments has not been indicated  Medication Review Human resources officer) Complete  Some recent data might be hidden

## 2019-04-20 NOTE — Progress Notes (Signed)
Spoke with patient's husband and provided him with an update on the patient's condition and plan of care.   Hamdi Kley, [place] 04/20/2019 1:57 PM

## 2019-04-20 NOTE — Progress Notes (Signed)
Occupational Therapy Treatment Patient Details Name: Tina Cruz MRN: [id number] DOB: 1961-01-20 Today's Date: 04/20/2019    History of present illness 58 yo female admitted to ED on 7/5 with abdominal pain, N/V, with diagnosis of PNA. Pt with stage IV lung cancer with mets to brain and bone, CT abdomen reveals multiple splenic infarcts, L3-L4 and T12 compression fractures, L pleural effusion. PMH includes depression, PE, GERD.   OT comments  Pt agreeable to OOB with OT even though not feeling well.    Follow Up Recommendations  Supervision/Assistance - 24 hour    Equipment Recommendations  3 in 1 bedside commode    Recommendations for Other Services      Precautions / Restrictions Precautions Precautions: Fall Precaution Comments: monitor HR       Mobility Bed Mobility Overal bed mobility: Needs Assistance Bed Mobility: Sidelying to Sit   Sidelying to sit: Min assist Supine to sit: Min assist        Transfers Overall transfer level: Needs assistance Equipment used: 1 person hand held assist Transfers: Sit to/from [facility] Sit to Stand: Min assist Stand pivot transfers: Min assist       General transfer comment: min/guard for safety, increased time required, self assist with UEs    Balance Overall balance assessment: Needs assistance Sitting-balance support: Feet supported Sitting balance-Leahy Scale: Good     Standing balance support: No upper extremity supported Standing balance-Leahy Scale: Fair                             ADL either performed or assessed with clinical judgement   ADL Overall ADL's : Needs assistance/impaired     Grooming: Wash/dry face;Wash/dry hands;Oral care;Sitting;Set up                   Toilet Transfer: Minimal assistance;Stand-pivot;Cueing for sequencing;Cueing for safety Toilet Transfer Details (indicate cue type and reason): bed to chair Toileting- Clothing Manipulation and  Hygiene: Minimal assistance;Sit to/from stand;Cueing for sequencing;Cueing for compensatory techniques;Cueing for safety         General ADL Comments: pt feeling nauseous. RN aware. Pt did agree to sit OOB in chair.     Vision Patient Visual Report: No change from baseline            Cognition Arousal/Alertness: Awake/alert Behavior During Therapy: Flat affect;WFL for tasks assessed/performed Overall Cognitive Status: Within Functional Limits for tasks assessed                                                     Pertinent Vitals/ Pain       Pain Score: 2  Pain Location: back Pain Descriptors / Indicators: Aching Pain Intervention(s): Limited activity within patient's tolerance         Frequency  Min 2X/week        Progress Toward Goals  OT Goals(current goals can now be found in the care plan section)  Progress towards OT goals: Progressing toward goals     Plan Discharge plan remains appropriate       AM-PAC OT "6 Clicks" Daily Activity     Outcome Measure   Help from another person eating meals?: None Help from another person taking care of personal grooming?: A Little Help from another person toileting, which includes  using toliet, bedpan, or urinal?: A Little Help from another person bathing (including washing, rinsing, drying)?: A Little Help from another person to put on and taking off regular upper body clothing?: None Help from another person to put on and taking off regular lower body clothing?: A Little 6 Click Score: 20    End of Session Equipment Utilized During Treatment: Rolling walker  OT Visit Diagnosis: Unsteadiness on feet (R26.81);Muscle weakness (generalized) (M62.81)   Activity Tolerance Patient tolerated treatment well   Patient Left in chair;with chair alarm set   Nurse Communication Mobility status        Time: 1420-1440 OT Time Calculation (min): 20 min  Charges: OT General Charges $OT Visit: 1  Visit OT Treatments $Self Care/Home Management : 8-22 mins  Kari Baars, [place] [phone] Office- [phone]      Shalice Woodring, Edwena Felty D 04/20/2019, 3:19 PM

## 2019-04-20 NOTE — Progress Notes (Signed)
PROGRESS NOTE  Tina Cruz MIW:[id number] DOB: 12/21/1960 DOA: 04/16/2019 PCP: [name], MD  HPI/Recap of past 50 hours: 58 year old female with PMH of stage IV non-small cell lung cancer, adenocarcinoma with metastasis to bone, brain, right adrenal gland, malignant left pleural effusion, GERD, hypothyroid, presented with intractable nausea, nonbloody emesis that began on the afternoon of admission, associated with periumbilical abdominal pain which she thinks may be related to her nausea and vomiting, chronic cough and dyspnea but now with productive cough and dyspnea on minimal exertion, inability to keep anything down.  She was admitted for intractable nausea, vomiting, abdominal pain, splenic and right renal infarcts, suspected community-acquired pneumonia versus aspiration pneumonia and recurrent left malignant pleural effusion.  IR and oncology were consulted.  S/p 100 mL ultrasound-guided left thoracentesis.  04/20/19: Patient seen and examined at bedside.  Reports persistent productive cough with no pleuritic chest pain.  Also generalized weakness.   Assessment/Plan: Principal Problem:   Pneumonia Active Problems:   GERD (gastroesophageal reflux disease)   Adenocarcinoma of left lung, stage 4 (HCC)   Recurrent left pleural effusion   Bone metastases (HCC)   Brain metastasis (HCC)   Splenic infarct   Lumbar compression fracture (HCC)   Renal infarct (HCC)   Abdominal pain   Nausea & vomiting  Intractable nausea, vomiting and abdominal pain Nausea is improving with antiemetics Suspect secondary to splenic and right renal infarct Continue supportive care No sign of obstruction on CT abdomen and pelvis  Acute hypoxic respiratory failure likely multifactorial secondary to stage IV lung cancer versus radiation changes versus community-acquired pneumonia O2 saturation 85% on room air Not on oxygen supplementation at baseline. Continue to maintain O2 saturation  greater than 92% Continue nebs as needed Patient is allergic to albuterol Independently reviewed chest x-ray done on 04/17/2017 which showed extensive consolidation throughout the left lung field.  Right lung field is essentially clear. Home O2 evaluation prior to discharge  Intractable cough likely secondary to lung cancer Continue to treat symptomatically On Tussionex and Tessalon perles  Community-acquired pneumonia versus post radiation changes Continue azithromycin and Rocephin Procalcitonin 0.14 on 04/19/2019 Continue monitor fever curve and WBC  Splenic and right renal infarct in the setting of stage IV lung cancer on Eliquis Reports compliance with Eliquis Oncology recommends subcu Lovenox full dose On subcu Lovenox 100 mg daily started on 04/18/2019 No valvular lesions on TTE  Recurrent malignant L pleural affusion Post left thoracentesis with 100 cc of bloody pleural fluid removed Cytology is pending  L3 compression fracture Likely pathologic compression fracture in the setting of stage IV lung cancer with bone mets  Stage IV non-small cell lung cancer, adenocarcinoma with brain mets  Oncology consultation appreciated.  Continue treatment with Tag[place], patient will have to bring her home supplies.  Patient reportedly has an appointment with Dr. Isidore Moos, Radiation oncology on 7/7, please contact them in a.m.  I reviewed patient's recent MRI brain 04/03/2019 results with her spouse, 2 intracranial metastasis are decreasing in size but skull metastasis are enlarging.  Per oncology her treated brain mets responded well to the radiation treatment and there are no new lesions.  The plan is to repeat a brain MRI in 3 months and follow-up with neurosurgeon.  History of pulmonary embolism Continue subcu Lovenox full dose daily  Prolonged QTC  QTC improved from 497 on admission EKG to 475 on 7/6.  Minimize QT prolonging medications.  Monitor EKG periodically.  Physical  debility/ambulatory dysfunction PT OT assessed and  recommended home health PT OT Continue physical therapy Fall precautions    DVT prophylaxis:  Full dose subcu Lovenox Code Status: Full Family Communication:  Will call family if okay with the patient.    Disposition:  DC to home with home health services possibly in 2 to 3 days when clinically stable.   Consultants:  Medical oncology IR  Procedures:  Ultrasound-guided left thoracentesis by IR 7/6.  Antimicrobials:  IV ceftriaxone and azithromycin  Objective: Vitals:   04/19/19 0830 04/19/19 1503 04/19/19 2029 04/20/19 0410  BP:  120/70 121/69 109/73  Pulse: (!) 107 (!) 101 (!) 105 (!) 103  Resp:  18 20 20   Temp:  98.9 F (37.2 C) 98.7 F (37.1 C) 98.9 F (37.2 C)  TempSrc:  Oral Oral Oral  SpO2: 94% 96%  94%  Weight:      Height:        Intake/Output Summary (Last 24 hours) at 04/20/2019 1225 Last data filed at 04/20/2019 1124 Gross per 24 hour  Intake 1841.69 ml  Output 1000 ml  Net 841.69 ml   Filed Weights   04/16/19 2043 04/17/19 0028 04/17/19 0433  Weight: 70.3 kg 68 kg 67.2 kg    Exam:  . General: 58 y.o. year-old female well-developed well-nourished in no acute distress.  Alert and oriented x3.   . Cardiovascular: Regular rate and rhythm with no rubs or gallops no JVD or thyromegaly. Marland Kitchen Respiratory: Mild rales at bases with no wheezes noted.  Poor inspiratory effort.  . Abdomen: Soft nontender nondistended normal bowel sounds x4 quadrants. . Musculoskeletal: Trace lower extremity edema.  Peripheral pulses 2/4 extremities. Marland Kitchen Psychiatry: Mood is appropriate for condition and setting.   Data Reviewed: CBC: Recent Labs  Lab 04/16/19 2103 04/17/19 0448 04/18/19 0425  WBC 10.8* 10.6* 11.8*  NEUTROABS 9.5*  --   --   HGB 13.1 12.6 11.6*  HCT 39.0 37.7 35.4*  MCV 88.4 89.1 91.7  PLT 171 155 818*   Basic Metabolic Panel: Recent Labs  Lab 04/16/19 2103 04/17/19 0448 04/18/19 0425  NA 142  140 137  K 3.8 3.8 4.4  CL 105 105 106  CO2 22 24 22   GLUCOSE 128* 134* 118*  BUN 14 14 11   CREATININE 0.69 0.64 0.62  CALCIUM 9.5 9.2 8.8*  MG  --  2.2  --    GFR: Estimated Creatinine Clearance: 75.4 mL/min (by C-G formula based on SCr of 0.62 mg/dL). Liver Function Tests: Recent Labs  Lab 04/16/19 2103  AST 41  ALT 16  ALKPHOS 77  BILITOT 0.8  PROT 7.1  ALBUMIN 3.7   Recent Labs  Lab 04/16/19 2103  LIPASE 31   No results for input(s): AMMONIA in the last 168 hours. Coagulation Profile: Recent Labs  Lab 04/17/19 0448  INR 1.4*   Cardiac Enzymes: No results for input(s): CKTOTAL, CKMB, CKMBINDEX, TROPONINI in the last 168 hours. BNP (last 3 results) No results for input(s): PROBNP in the last 8760 hours. HbA1C: No results for input(s): HGBA1C in the last 72 hours. CBG: Recent Labs  Lab 04/17/19 0507  GLUCAP 119*   Lipid Profile: No results for input(s): CHOL, HDL, LDLCALC, TRIG, CHOLHDL, LDLDIRECT in the last 72 hours. Thyroid Function Tests: No results for input(s): TSH, T4TOTAL, FREET4, T3FREE, THYROIDAB in the last 72 hours. Anemia Panel: No results for input(s): VITAMINB12, FOLATE, FERRITIN, TIBC, IRON, RETICCTPCT in the last 72 hours. Urine analysis:    Component Value Date/Time   COLORURINE YELLOW 04/16/2019 2103  APPEARANCEUR HAZY (A) 04/16/2019 2103   LABSPEC 1.017 04/16/2019 2103   PHURINE 7.0 04/16/2019 2103   GLUCOSEU NEGATIVE 04/16/2019 2103   HGBUR MODERATE (A) 04/16/2019 2103   BILIRUBINUR NEGATIVE 04/16/2019 2103   KETONESUR 80 (A) 04/16/2019 2103   PROTEINUR 30 (A) 04/16/2019 2103   NITRITE NEGATIVE 04/16/2019 2103   LEUKOCYTESUR TRACE (A) 04/16/2019 2103   Sepsis Labs: @LABRCNTIP (procalcitonin:4,lacticidven:4)  ) Recent Results (from the past 240 hour(s))  Blood culture (routine x 2)     Status: None (Preliminary result)   Collection Time: 04/16/19  9:03 PM   Specimen: BLOOD  Result Value Ref Range Status   Specimen  Description   Final    BLOOD Performed at [facility], [place] [address], [place], [place] [address]    Special Requests   Final    BOTTLES DRAWN AEROBIC AND ANAEROBIC Blood Culture adequate volume Performed at [place] [address], [place], [place] [address]    Culture   Final    NO GROWTH 3 DAYS Performed at [facility], [place] [address] [place], [place] [address]    Report Status PENDING  Incomplete  SARS Coronavirus 2 (CEPHEID - Performed in [place] hospital lab), Hosp Order     Status: None   Collection Time: 04/17/19 12:22 AM   Specimen: Urine, Clean Catch; Nasopharyngeal  Result Value Ref Range Status   SARS Coronavirus 2 NEGATIVE NEGATIVE Final    Comment: (NOTE) If result is NEGATIVE SARS-CoV-2 target nucleic acids are NOT DETECTED. The SARS-CoV-2 RNA is generally detectable in upper and lower  respiratory specimens during the acute phase of infection. The lowest  concentration of SARS-CoV-2 viral copies this assay can detect is 250  copies / mL. A negative result does not preclude SARS-CoV-2 infection  and should not be used as the sole basis for treatment or other  patient management decisions.  A negative result may occur with  improper specimen collection / handling, submission of specimen other  than nasopharyngeal swab, presence of viral mutation(s) within the  areas targeted by this assay, and inadequate number of viral copies  (<250 copies / mL). A negative result must be combined with clinical  observations, patient history, and epidemiological information. If result is POSITIVE SARS-CoV-2 target nucleic acids are DETECTED. The SARS-CoV-2 RNA is generally detectable in upper and lower  respiratory specimens dur ing the acute phase of infection.  Positive  results are indicative of active infection with SARS-CoV-2.  Clinical  correlation with patient history and other diagnostic information is  necessary to  determine patient infection status.  Positive results do  not rule out bacterial infection or co-infection with other viruses. If result is PRESUMPTIVE POSTIVE SARS-CoV-2 nucleic acids MAY BE PRESENT.   A presumptive positive result was obtained on the submitted specimen  and confirmed on repeat testing.  While 2019 novel coronavirus  (SARS-CoV-2) nucleic acids may be present in the submitted sample  additional confirmatory testing may be necessary for epidemiological  and / or clinical management purposes  to differentiate between  SARS-CoV-2 and other Sarbecovirus currently known to infect humans.  If clinically indicated additional testing with an alternate test  methodology [phone]) is advised. The SARS-CoV-2 RNA is generally  detectable in upper and lower respiratory sp ecimens during the acute  phase of infection. The expected result is Negative. Fact Sheet for Patients:  StrictlyIdeas.no Fact Sheet for Healthcare Providers: BankingDealers.co.za This test is not yet approved or cleared  by the [place] and has been authorized for detection and/or diagnosis of SARS-CoV-2 by FDA under an Emergency Use Authorization (EUA).  This EUA will remain in effect (meaning this test can be used) for the duration of the COVID-19 declaration under Section 564(b)(1) of the Act, 21 U.S.C. section 360bbb-3(b)(1), unless the authorization is terminated or revoked sooner. Performed at [facility], [place] [address], [place], [place] [address]   Urine culture     Status: None   Collection Time: 04/17/19 12:55 AM   Specimen: Urine, Random  Result Value Ref Range Status   Specimen Description   Final    URINE, RANDOM Performed at [place] [address], [place], [place] [address]    Special Requests   Final    NONE Performed at [facility], [place] [address], [place], [place]  [address]    Culture   Final    NO GROWTH Performed at [facility], [place] [address], [place], [place] [address]    Report Status 04/18/2019 FINAL  Final  MRSA PCR Screening     Status: None   Collection Time: 04/17/19  4:39 AM   Specimen: Nasal Mucosa; Nasopharyngeal  Result Value Ref Range Status   MRSA by PCR NEGATIVE NEGATIVE Final    Comment:        The GeneXpert MRSA Assay (FDA approved for NASAL specimens only), is one component of a comprehensive MRSA colonization surveillance program. It is not intended to diagnose MRSA infection nor to guide or monitor treatment for MRSA infections. Performed at [facility], [place] [address] [place], [place] [address]   Blood culture (routine x 2)     Status: None (Preliminary result)   Collection Time: 04/17/19  4:48 AM   Specimen: BLOOD  Result Value Ref Range Status   Specimen Description   Final    BLOOD RIGHT ANTECUBITAL Performed at [place] [address], [place], [place] [address]    Special Requests   Final    BOTTLES DRAWN AEROBIC ONLY Blood Culture adequate volume Performed at [place] [address], [place], [place] [address]    Culture   Final    NO GROWTH 3 DAYS Performed at [facility], [place] [address], [place], [place] [address]    Report Status PENDING  Incomplete  Body fluid culture     Status: None   Collection Time: 04/17/19 12:03 PM   Specimen: Pleural, Left; Pleural Fluid  Result Value Ref Range Status   Specimen Description   Final    PLEURAL Performed at [facility], [place] [address] [place], [place] [address]    Special Requests   Final    NONE Performed at [facility], [place] [address], [place], [place] [address]    Gram Stain   Final    RARE WBC PRESENT, PREDOMINANTLY MONONUCLEAR NO ORGANISMS SEEN    Culture   Final    NO GROWTH 3 DAYS Performed at [facility], [place] [address]  [place],  [address]    Report Status 04/20/2019 FINAL  Final  Sputum culture     Status: None   Collection Time: 04/18/19  8:00 AM   Specimen: Sputum  Result Value Ref Range Status   Specimen Description SPUTUM  Final   Special Requests NONE  Final   Sputum evaluation   Final    THIS SPECIMEN  IS ACCEPTABLE FOR SPUTUM CULTURE Performed at [facility], [place] [address], [place], [place] [address]    Report Status 04/18/2019 FINAL  Final  Culture, respiratory     Status: None (Preliminary result)   Collection Time: 04/18/19  8:00 AM   Specimen: SPU  Result Value Ref Range Status   Specimen Description   Final    SPUTUM Performed at [place] [address], [place], [place] [address]    Special Requests   Final    NONE Reflexed from [id number] Performed at [facility], [place] [address], [place], [place] [address]    Gram Stain   Final    FEW WBC PRESENT,BOTH PMN AND MONONUCLEAR FEW GRAM VARIABLE ROD RARE GRAM POSITIVE COCCI IN PAIRS    Culture   Final    CULTURE REINCUBATED FOR BETTER GROWTH Performed at [facility], [place] [address] [place], [place] [address]    Report Status PENDING  Incomplete      Studies: No results found.  Scheduled Meds: . chlorpheniramine-HYDROcodone  5 mL Oral Q12H  . enoxaparin (LOVENOX) injection  1.5 mg/kg Subcutaneous Q24H  . feeding supplement  1 Container Oral TID BM  . guaiFENesin  1,200 mg Oral BID  . levothyroxine  112 mcg Oral Q0600  . loratadine  10 mg Oral Daily  . montelukast  10 mg Oral QHS  . multivitamin with minerals  1 tablet Oral Daily  . osimertinib mesylate  80 mg Oral Daily  . pantoprazole (PROTONIX) IV  40 mg Intravenous Q24H    Continuous Infusions: . 0.9 % NaCl with KCl 40 mEq / L 75 mL/hr (04/20/19 0832)  . azithromycin 500 mg (04/20/19 0539)  . cefTRIAXone (ROCEPHIN)  IV 2 g (04/20/19 0501)     LOS: 3 days     Kayleen Memos, MD [facility] Pager [phone]  If 7PM-7AM, please contact night-coverage [email or web address] Password [email or web address] 04/20/2019, 12:25 PM

## 2019-04-21 ENCOUNTER — Inpatient Hospital Stay (HOSPITAL_COMMUNITY): Payer: Managed Care, Other (non HMO)

## 2019-04-21 LAB — CBC WITH DIFFERENTIAL/PLATELET
Abs Immature Granulocytes: 0.04 10*3/uL (ref 0.00–0.07)
Basophils Absolute: 0 10*3/uL (ref 0.0–0.1)
Basophils Relative: 0 %
Eosinophils Absolute: 0.1 10*3/uL (ref 0.0–0.5)
Eosinophils Relative: 1 %
HCT: 31.1 % — ABNORMAL LOW (ref 36.0–46.0)
Hemoglobin: 10 g/dL — ABNORMAL LOW (ref 12.0–15.0)
Immature Granulocytes: 1 %
Lymphocytes Relative: 6 %
Lymphs Abs: 0.5 10*3/uL — ABNORMAL LOW (ref 0.7–4.0)
MCH: 29 pg (ref 26.0–34.0)
MCHC: 32.2 g/dL (ref 30.0–36.0)
MCV: 90.1 fL (ref 80.0–100.0)
Monocytes Absolute: 0.9 10*3/uL (ref 0.1–1.0)
Monocytes Relative: 10 %
Neutro Abs: 6.7 10*3/uL (ref 1.7–7.7)
Neutrophils Relative %: 82 %
Platelets: 138 10*3/uL — ABNORMAL LOW (ref 150–400)
RBC: 3.45 MIL/uL — ABNORMAL LOW (ref 3.87–5.11)
RDW: 12.1 % (ref 11.5–15.5)
WBC: 8.2 10*3/uL (ref 4.0–10.5)
nRBC: 0 % (ref 0.0–0.2)

## 2019-04-21 LAB — PHOSPHORUS: Phosphorus: 2.6 mg/dL (ref 2.5–4.6)

## 2019-04-21 LAB — BASIC METABOLIC PANEL
Anion gap: 11 (ref 5–15)
BUN: 9 mg/dL (ref 6–20)
CO2: 22 mmol/L (ref 22–32)
Calcium: 8.1 mg/dL — ABNORMAL LOW (ref 8.9–10.3)
Chloride: 103 mmol/L (ref 98–111)
Creatinine, Ser: 0.6 mg/dL (ref 0.44–1.00)
GFR calc Af Amer: >60 mL/min (ref >60)
GFR calc non Af Amer: >60 mL/min (ref >60)
Glucose, Bld: 114 mg/dL — ABNORMAL HIGH (ref 70–99)
Potassium: 3.8 mmol/L (ref 3.5–5.1)
Sodium: 136 mmol/L (ref 135–145)

## 2019-04-21 LAB — CULTURE, RESPIRATORY W GRAM STAIN: Culture: NORMAL

## 2019-04-21 LAB — MAGNESIUM: Magnesium: 2 mg/dL (ref 1.7–2.4)

## 2019-04-21 IMAGING — DX PORTABLE CHEST - 1 VIEW
1 series · 1 of 1 positions shown · non-contrast
Comparison: Chest CT [DATE] and chest radiograph [DATE]

CLINICAL DATA: 57-year-old with left lung cancer, pneumonia and
cough.

EXAM:
PORTABLE CHEST 1 VIEW

[chest ap]
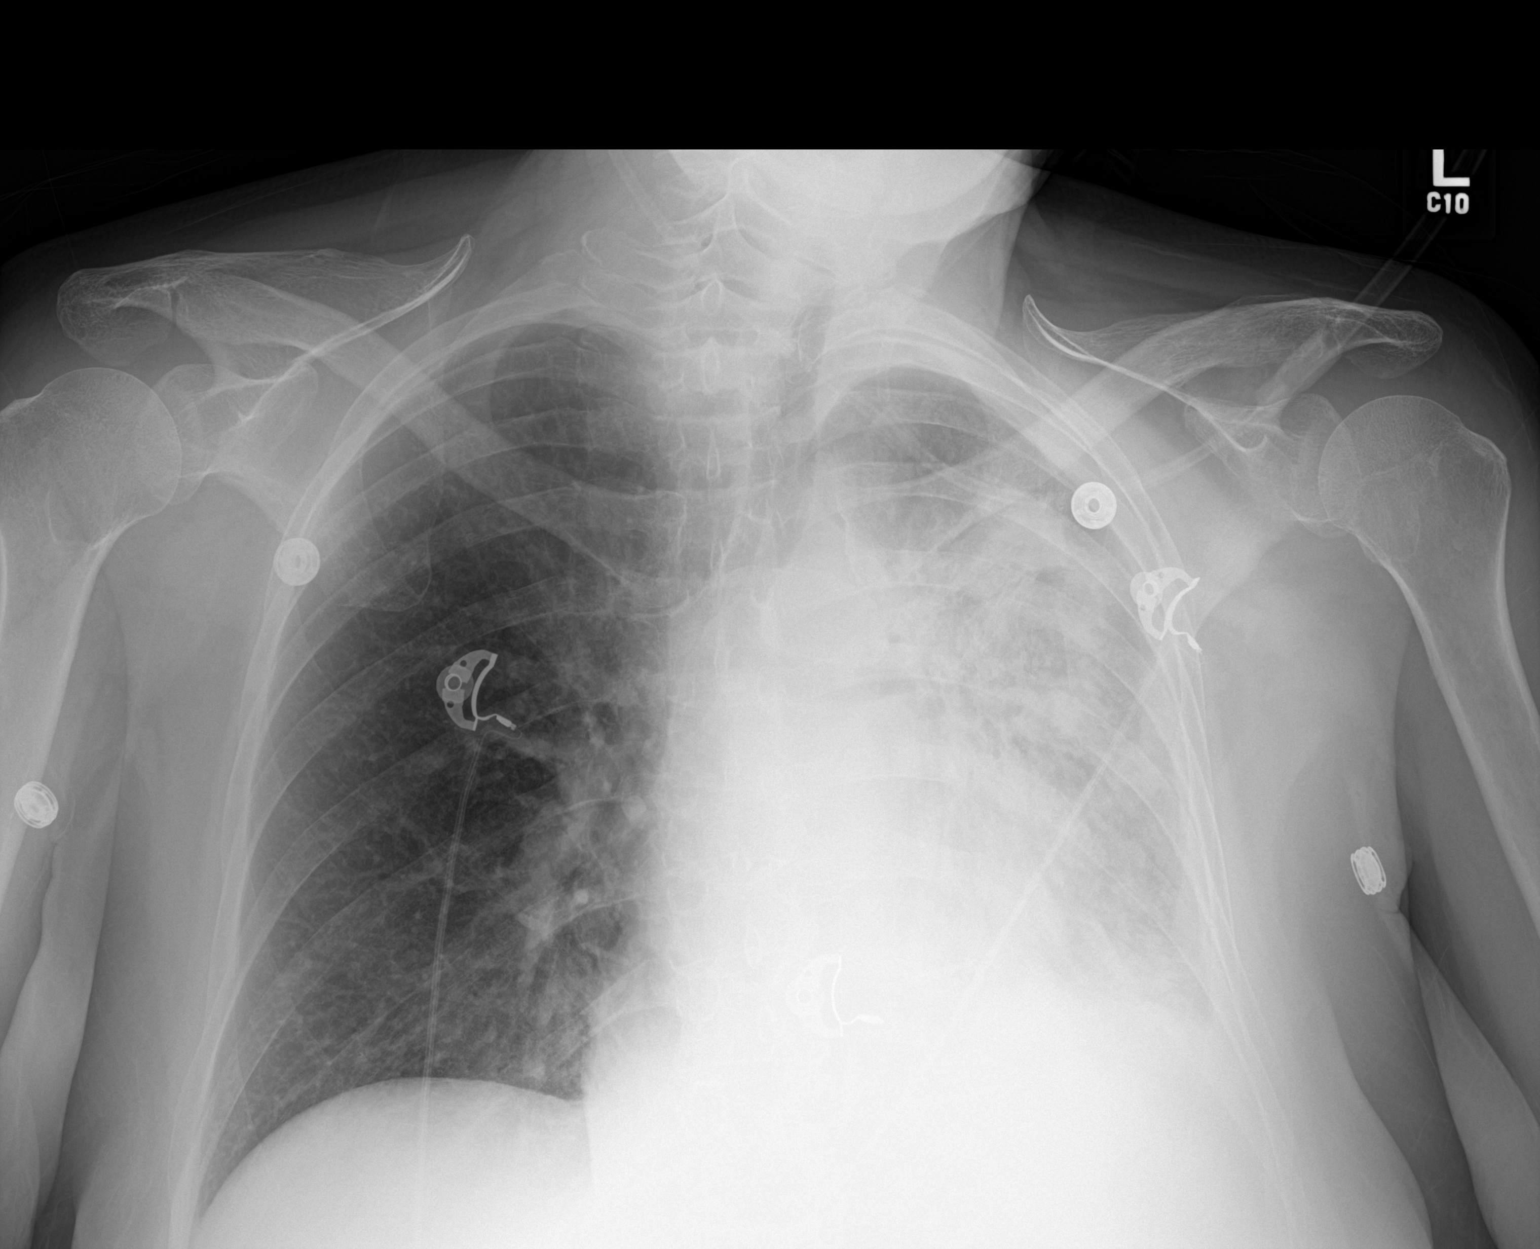

[1 of 1 positions shown; findings below may reference images not displayed]

FINDINGS: Persistent volume loss in the left hemithorax. There continues to be
a large amount of airspace disease and consolidation in the left
lung. Left lung disease may have mildly progressed in the apical
region. Right lung remains clear. Heart and mediastinum are grossly
stable. Negative for pneumothorax.
IMPRESSION: Persistent airspace densities and consolidation in the left lung.
Minimal change but there may be slight progression in the left
apical region.

## 2019-04-21 NOTE — Progress Notes (Addendum)
PROGRESS NOTE  Tina Cruz FXT:[id number] DOB: May 24, 1961 DOA: 04/16/2019 PCP: [name], MD  HPI/Recap of past 74 hours: 58 year old female with PMH of stage IV non-small cell lung cancer, adenocarcinoma with metastasis to bone, brain, right adrenal gland, malignant left pleural effusion, GERD, hypothyroid, presented with intractable nausea, nonbloody emesis that began on the afternoon of admission, associated with periumbilical abdominal pain which she thinks may be related to her nausea and vomiting, chronic cough and dyspnea but now with productive cough and dyspnea on minimal exertion, inability to keep anything down.  She was admitted for intractable nausea, vomiting, abdominal pain, splenic and right renal infarcts, suspected community-acquired pneumonia versus aspiration pneumonia and recurrent left malignant pleural effusion.  IR and oncology were consulted.  S/p 100 mL ultrasound-guided left thoracentesis.  04/21/19: Patient seen and examined at her bedside.  She reports worsening cough.  Will obtain a chest x-ray to further assess.  She denies chest pain.   Assessment/Plan: Principal Problem:   Pneumonia Active Problems:   GERD (gastroesophageal reflux disease)   Adenocarcinoma of left lung, stage 4 (HCC)   Recurrent left pleural effusion   Bone metastases (HCC)   Brain metastasis (HCC)   Splenic infarct   Lumbar compression fracture (HCC)   Renal infarct (HCC)   Abdominal pain   Nausea & vomiting  Resolving intractable nausea, vomiting and abdominal pain Nausea is improving with antiemetics Suspect secondary to splenic and right renal infarct Continue supportive care No sign of obstruction on CT abdomen and pelvis  Acute hypoxic respiratory failure likely multifactorial secondary to stage IV lung cancer versus radiation changes versus community-acquired pneumonia O2 saturation 98% on room from hypoxia in the mid 80s on presentation Not on oxygen  supplementation at baseline. Continue to maintain O2 saturation greater than 92% Continue nebs as needed Patient is allergic to albuterol Independently reviewed chest x-ray done on 04/17/2017 which showed extensive consolidation throughout the left lung field.  Right lung field is essentially clear. Home O2 evaluation prior to discharge Repeat chest x-ray on 04/21/2019 due to persistent cough  Persistently intractable cough likely secondary to lung cancer Continue to treat symptomatically On Tussionex and Tessalon perles Repeat chest x-ray  Community-acquired pneumonia versus post radiation changes Completed 5 days of Rocephin and IV azithromycin Procalcitonin 0.14 on 04/19/2019 Afebrile with no leukocytosis  Acute dehydration secondary to poor oral intake Continue IV fluid hydration normal saline at 75 cc/h Encourage increasing oral intake Continue oral supplement  Splenic and right renal infarct in the setting of stage IV lung cancer on Eliquis Reports compliance with Eliquis Oncology recommends subcu Lovenox full dose On subcu Lovenox 100 mg daily started on 04/18/2019 No valvular lesions on TTE  Recurrent malignant L pleural affusion Post left thoracentesis with 100 cc of bloody pleural fluid removed Cytology is pending  L3 compression fracture Likely pathologic compression fracture in the setting of stage IV lung cancer with bone mets  Stage IV non-small cell lung cancer, adenocarcinoma with brain mets  Oncology consultation appreciated.  Continue treatment with Tagrisso, patient will have to bring her home supplies.  Patient reportedly has an appointment with Dr. Isidore Moos, Radiation oncology on 7/7, please contact them in a.m.  I reviewed patient's recent MRI brain 04/03/2019 results with her spouse, 2 intracranial metastasis are decreasing in size but skull metastasis are enlarging.  Per oncology her treated brain mets responded well to the radiation treatment and there are  no new lesions.  The plan is to repeat  a brain MRI in 3 months and follow-up with neurosurgeon.  History of pulmonary embolism Continue subcu Lovenox full dose daily  Prolonged QTC  QTC improved from 497 on admission EKG to 475 on 7/6.  Minimize QT prolonging medications.  Monitor EKG periodically.  Physical debility/ambulatory dysfunction PT OT assessed and recommended home health PT OT Continue physical therapy DME 3 in 1 bedside commode  Fall precautions    DVT prophylaxis:  Full dose subcu Lovenox Code Status: Full Family Communication:  Will call family if okay with the patient.    Disposition:  DC to home with home health services possibly in 2 to 3 days when clinically stable.   Consultants:  Medical oncology IR  Procedures:  Ultrasound-guided left thoracentesis by IR 7/6.  Antimicrobials:  IV ceftriaxone and azithromycin  Objective: Vitals:   04/19/19 2029 04/20/19 0410 04/20/19 2054 04/21/19 0551  BP: 121/69 109/73 (!) 143/71 121/76  Pulse: (!) 105 (!) 103 (!) 110 (!) 101  Resp: 20 20 18 18   Temp: 98.7 F (37.1 C) 98.9 F (37.2 C) 98.4 F (36.9 C) 98.9 F (37.2 C)  TempSrc: Oral Oral Oral Oral  SpO2:  94% 98% 94%  Weight:      Height:        Intake/Output Summary (Last 24 hours) at 04/21/2019 0949 Last data filed at 04/21/2019 4401 Gross per 24 hour  Intake 1571.65 ml  Output 2600 ml  Net -1028.35 ml   Filed Weights   04/16/19 2043 04/17/19 0028 04/17/19 0433  Weight: 70.3 kg 68 kg 67.2 kg    Exam:  . General: 58 y.o. year-old female well-developed well-nourished appears uncomfortable due to persistent cough..  Alert oriented x3.   . Cardiovascular: Regular rate and rhythm with no rubs or gallops.  No JVD or thyromegaly.   Marland Kitchen Respiratory: Mild rales at bases with no wheezes noted.  Poor inspiratory effort. . Abdomen: Soft nontender nondistended with normal bowel sounds x4 quadrants.  Musculoskeletal: Trace lower extremity edema.  2 out  of 4 pulses in all 4 extremities. Marland Kitchen Psychiatry: Mood is appropriate for condition and setting.   Data Reviewed: CBC: Recent Labs  Lab 04/16/19 2103 04/17/19 0448 04/18/19 0425 04/21/19 0623  WBC 10.8* 10.6* 11.8* 8.2  NEUTROABS 9.5*  --   --  6.7  HGB 13.1 12.6 11.6* 10.0*  HCT 39.0 37.7 35.4* 31.1*  MCV 88.4 89.1 91.7 90.1  PLT 171 155 122* 027*   Basic Metabolic Panel: Recent Labs  Lab 04/16/19 2103 04/17/19 0448 04/18/19 0425 04/21/19 0623  NA 142 140 137 136  K 3.8 3.8 4.4 3.8  CL 105 105 106 103  CO2 22 24 22 22   GLUCOSE 128* 134* 118* 114*  BUN 14 14 11 9   CREATININE 0.69 0.64 0.62 0.60  CALCIUM 9.5 9.2 8.8* 8.1*  MG  --  2.2  --  2.0  PHOS  --   --   --  2.6   GFR: Estimated Creatinine Clearance: 75.4 mL/min (by C-G formula based on SCr of 0.6 mg/dL). Liver Function Tests: Recent Labs  Lab 04/16/19 2103  AST 41  ALT 16  ALKPHOS 77  BILITOT 0.8  PROT 7.1  ALBUMIN 3.7   Recent Labs  Lab 04/16/19 2103  LIPASE 31   No results for input(s): AMMONIA in the last 168 hours. Coagulation Profile: Recent Labs  Lab 04/17/19 0448  INR 1.4*   Cardiac Enzymes: No results for input(s): CKTOTAL, CKMB, CKMBINDEX, TROPONINI in the last  168 hours. BNP (last 3 results) No results for input(s): PROBNP in the last 8760 hours. HbA1C: No results for input(s): HGBA1C in the last 72 hours. CBG: Recent Labs  Lab 04/17/19 0507  GLUCAP 119*   Lipid Profile: No results for input(s): CHOL, HDL, LDLCALC, TRIG, CHOLHDL, LDLDIRECT in the last 72 hours. Thyroid Function Tests: No results for input(s): TSH, T4TOTAL, FREET4, T3FREE, THYROIDAB in the last 72 hours. Anemia Panel: No results for input(s): VITAMINB12, FOLATE, FERRITIN, TIBC, IRON, RETICCTPCT in the last 72 hours. Urine analysis:    Component Value Date/Time   COLORURINE YELLOW 04/16/2019 2103   APPEARANCEUR HAZY (A) 04/16/2019 2103   LABSPEC 1.017 04/16/2019 2103   PHURINE 7.0 04/16/2019 2103    GLUCOSEU NEGATIVE 04/16/2019 2103   HGBUR MODERATE (A) 04/16/2019 2103   BILIRUBINUR NEGATIVE 04/16/2019 2103   KETONESUR 80 (A) 04/16/2019 2103   PROTEINUR 30 (A) 04/16/2019 2103   NITRITE NEGATIVE 04/16/2019 2103   LEUKOCYTESUR TRACE (A) 04/16/2019 2103   Sepsis Labs: @LABRCNTIP (procalcitonin:4,lacticidven:4)  ) Recent Results (from the past 240 hour(s))  Blood culture (routine x 2)     Status: None (Preliminary result)   Collection Time: 04/16/19  9:03 PM   Specimen: BLOOD  Result Value Ref Range Status   Specimen Description   Final    BLOOD Performed at [facility], [place] [address] [place], [place] [address]    Special Requests   Final    BOTTLES DRAWN AEROBIC AND ANAEROBIC Blood Culture adequate volume Performed at [place] [address], [place], [place] [address]    Culture   Final    NO GROWTH 3 DAYS Performed at [facility], [place] [address], [place], [place] [address]    Report Status PENDING  Incomplete  SARS Coronavirus 2 (CEPHEID - Performed in [place] hospital lab), Hosp Order     Status: None   Collection Time: 04/17/19 12:22 AM   Specimen: Urine, Clean Catch; Nasopharyngeal  Result Value Ref Range Status   SARS Coronavirus 2 NEGATIVE NEGATIVE Final    Comment: (NOTE) If result is NEGATIVE SARS-CoV-2 target nucleic acids are NOT DETECTED. The SARS-CoV-2 RNA is generally detectable in upper and lower  respiratory specimens during the acute phase of infection. The lowest  concentration of SARS-CoV-2 viral copies this assay can detect is 250  copies / mL. A negative result does not preclude SARS-CoV-2 infection  and should not be used as the sole basis for treatment or other  patient management decisions.  A negative result may occur with  improper specimen collection / handling, submission of specimen other  than nasopharyngeal swab, presence of viral mutation(s) within the  areas targeted by this assay,  and inadequate number of viral copies  (<250 copies / mL). A negative result must be combined with clinical  observations, patient history, and epidemiological information. If result is POSITIVE SARS-CoV-2 target nucleic acids are DETECTED. The SARS-CoV-2 RNA is generally detectable in upper and lower  respiratory specimens dur ing the acute phase of infection.  Positive  results are indicative of active infection with SARS-CoV-2.  Clinical  correlation with patient history and other diagnostic information is  necessary to determine patient infection status.  Positive results do  not rule out bacterial infection or co-infection with other viruses. If result is PRESUMPTIVE POSTIVE SARS-CoV-2 nucleic acids MAY BE PRESENT.   A presumptive positive result was obtained on the submitted specimen  and confirmed on repeat testing.  While 2019 novel coronavirus  (SARS-CoV-2) nucleic acids may be present in the submitted sample  additional confirmatory testing may be necessary for epidemiological  and / or clinical management purposes  to differentiate between  SARS-CoV-2 and other Sarbecovirus currently known to infect humans.  If clinically indicated additional testing with an alternate test  methodology [phone]) is advised. The SARS-CoV-2 RNA is generally  detectable in upper and lower respiratory sp ecimens during the acute  phase of infection. The expected result is Negative. Fact Sheet for Patients:  StrictlyIdeas.no Fact Sheet for Healthcare Providers: BankingDealers.co.za This test is not yet approved or cleared by the [place] FDA and has been authorized for detection and/or diagnosis of SARS-CoV-2 by FDA under an Emergency Use Authorization (EUA).  This EUA will remain in effect (meaning this test can be used) for the duration of the COVID-19 declaration under Section 564(b)(1) of the Act, 21 U.S.C. section 360bbb-3(b)(1), unless  the authorization is terminated or revoked sooner. Performed at [facility], [place] [address] [place], [place] [address]   Urine culture     Status: None   Collection Time: 04/17/19 12:55 AM   Specimen: Urine, Random  Result Value Ref Range Status   Specimen Description   Final    URINE, RANDOM Performed at [facility] [address], [place], [place] [address]    Special Requests   Final    NONE Performed at [facility], [place] [address] [place], [place] [address]    Culture   Final    NO GROWTH Performed at [facility], [place] [address], [place], [place] [address]    Report Status 04/18/2019 FINAL  Final  MRSA PCR Screening     Status: None   Collection Time: 04/17/19  4:39 AM   Specimen: Nasal Mucosa; Nasopharyngeal  Result Value Ref Range Status   MRSA by PCR NEGATIVE NEGATIVE Final    Comment:        The GeneXpert MRSA Assay (FDA approved for NASAL specimens only), is one component of a comprehensive MRSA colonization surveillance program. It is not intended to diagnose MRSA infection nor to guide or monitor treatment for MRSA infections. Performed at [facility], [place] [address], [place], [place] [address]   Blood culture (routine x 2)     Status: None (Preliminary result)   Collection Time: 04/17/19  4:48 AM   Specimen: BLOOD  Result Value Ref Range Status   Specimen Description   Final    BLOOD RIGHT ANTECUBITAL Performed at [place] [address], [place], [place] [address]    Special Requests   Final    BOTTLES DRAWN AEROBIC ONLY Blood Culture adequate volume Performed at [place] [address], [place], [place] [address]    Culture   Final    NO GROWTH 3 DAYS Performed at [facility], [place] [address], [place], [place] [address]    Report Status PENDING  Incomplete  Body fluid culture     Status: None   Collection  Time: 04/17/19 12:03 PM   Specimen: Pleural, Left; Pleural Fluid  Result Value Ref Range Status   Specimen Description   Final    PLEURAL Performed at [facility], [place] [address] [place], [place] [address]    Special Requests   Final    NONE Performed at [facility], [place] [address], [place], [place] [address]  Gram Stain   Final    RARE WBC PRESENT, PREDOMINANTLY MONONUCLEAR NO ORGANISMS SEEN    Culture   Final    NO GROWTH 3 DAYS Performed at [place] [facility] [address] [place], [place] [address]    Report Status 04/20/2019 FINAL  Final  Sputum culture     Status: None   Collection Time: 04/18/19  8:00 AM   Specimen: Sputum  Result Value Ref Range Status   Specimen Description SPUTUM  Final   Special Requests NONE  Final   Sputum evaluation   Final    THIS SPECIMEN IS ACCEPTABLE FOR SPUTUM CULTURE Performed at [facility], [place] [address], [place], [place] [address]    Report Status 04/18/2019 FINAL  Final  Culture, respiratory     Status: None (Preliminary result)   Collection Time: 04/18/19  8:00 AM   Specimen: SPU  Result Value Ref Range Status   Specimen Description   Final    SPUTUM Performed at [place] [address], [place], [place] [address]    Special Requests   Final    NONE Reflexed from [id number] Performed at [facility], [place] [address], [place], [place] [address]    Gram Stain   Final    FEW WBC PRESENT,BOTH PMN AND MONONUCLEAR FEW GRAM VARIABLE ROD RARE GRAM POSITIVE COCCI IN PAIRS    Culture   Final    CULTURE REINCUBATED FOR BETTER GROWTH Performed at [facility], [place] [address], [place], [place] [address]    Report Status PENDING  Incomplete      Studies: No results found.  Scheduled Meds: . benzonatate  100 mg Oral BID  . chlorpheniramine-HYDROcodone  5 mL Oral Q12H  . enoxaparin (LOVENOX) injection  1.5 mg/kg Subcutaneous Q24H  .  feeding supplement  1 Container Oral TID BM  . levothyroxine  112 mcg Oral Q0600  . loratadine  10 mg Oral Daily  . montelukast  10 mg Oral QHS  . multivitamin with minerals  1 tablet Oral Daily  . osimertinib mesylate  80 mg Oral Daily  . pantoprazole (PROTONIX) IV  40 mg Intravenous Q24H    Continuous Infusions: . 0.9 % NaCl with KCl 40 mEq / L 75 mL/hr (04/20/19 2152)     LOS: 4 days     Kayleen Memos, MD [facility] Pager [phone]  If 7PM-7AM, please contact night-coverage [email or web address] Password [facility] 04/21/2019, 9:49 AM

## 2019-04-21 NOTE — Progress Notes (Signed)
Physical Therapy Treatment Patient Details Name: Tina Cruz MRN: [id number] DOB: 1961/07/13 Today's Date: 04/21/2019    History of Present Illness 58 yo female admitted to ED on 7/5 with abdominal pain, N/V, with diagnosis of PNA. Pt with stage IV lung cancer with mets to brain and bone, CT abdomen reveals multiple splenic infarcts, L3-L4 and T12 compression fractures, L pleural effusion. PMH includes depression, PE, GERD.    PT Comments    Pt limited this session by back and fatigue, able to ambulate to and from bathroom without AD. Pt tolerated limited LE exercises to address LE weakness, pt stating she wants to do whatever she can to maintain her strength. Pt tearful with PT today due to feeling poorly, very dyspneic with ambulation requiring reapplication of supplemental O2. PT continuing to recommend HHPT, will continue to follow acutely.    Follow Up Recommendations  Home health PT;Supervision for mobility/OOB     Equipment Recommendations  Other (comment)(rollator for safety and endurance)    Recommendations for Other Services       Precautions / Restrictions Precautions Precautions: Fall Precaution Comments: monitor HR Restrictions Weight Bearing Restrictions: No    Mobility  Bed Mobility Overal bed mobility: Needs Assistance Bed Mobility: Supine to Sit   Sidelying to sit: Supervision Supine to sit: Supervision        Transfers Overall transfer level: Needs assistance Equipment used: 1 person hand held assist Transfers: Sit to/from Stand;Stand Pivot Transfers Sit to Stand: Min assist Stand pivot transfers: Min assist       General transfer comment: min/guard for safety, increased time required, self assist with UEs  Ambulation/Gait Ambulation/Gait assistance: Min guard;Supervision Gait Distance (Feet): 25 Feet(to and from bathroom only) Assistive device: None Gait Pattern/deviations: Step-through pattern;Trunk flexed Gait velocity: decr    General Gait Details: Supervision to min guard for safety, pt without AD use today and was steady. Pt limited by fatigue, HR up to 130 bpm and sats 88% on RA, 2LO2 repplied.   Stairs             Wheelchair Mobility    Modified Rankin (Stroke Patients Only)       Balance Overall balance assessment: Needs assistance Sitting-balance support: Feet supported Sitting balance-Leahy Scale: Good     Standing balance support: No upper extremity supported Standing balance-Leahy Scale: Fair                              Cognition Arousal/Alertness: Awake/alert Behavior During Therapy: WFL for tasks assessed/performed Overall Cognitive Status: Within Functional Limits for tasks assessed                                 General Comments: Pt tearful at times during session, states "there are days when I can be energetic, and there are days that I can't do anything"      Exercises General Exercises - Lower Extremity Long Arc Quad: AROM;Both;15 reps;Seated Hip ABduction/ADduction: AROM;Both;10 reps;Seated Hip Flexion/Marching: AROM;Both;10 reps;Seated    General Comments        Pertinent Vitals/Pain Pain Assessment: Faces Faces Pain Scale: Hurts even more Pain Location: back Pain Descriptors / Indicators: Aching;Sore Pain Intervention(s): Limited activity within patient's tolerance;Monitored during session;Repositioned    Home Living                      Prior Function  PT Goals (current goals can now be found in the care plan section) Acute Rehab PT Goals Patient Stated Goal: feel better PT Goal Formulation: With patient Time For Goal Achievement: 05/01/19 Potential to Achieve Goals: Good Progress towards PT goals: Progressing toward goals    Frequency    Min 3X/week      PT Plan Current plan remains appropriate    Co-evaluation              AM-PAC PT "6 Clicks" Mobility   Outcome Measure  Help  needed turning from your back to your side while in a flat bed without using bedrails?: A Little Help needed moving from lying on your back to sitting on the side of a flat bed without using bedrails?: A Little Help needed moving to and from a bed to a chair (including a wheelchair)?: A Little Help needed standing up from a chair using your arms (e.g., wheelchair or bedside chair)?: A Little Help needed to walk in hospital room?: A Little Help needed climbing 3-5 steps with a railing? : A Lot 6 Click Score: 17    End of Session Equipment Utilized During Treatment: Oxygen Activity Tolerance: Patient limited by fatigue Patient left: with call bell/phone within reach;with SCD's reapplied;in chair;with chair alarm set Nurse Communication: Mobility status PT Visit Diagnosis: Other abnormalities of gait and mobility (R26.89);Muscle weakness (generalized) (M62.81)     Time: 4098-1191 PT Time Calculation (min) (ACUTE ONLY): 26 min  Charges:  $Gait Training: 8-22 mins $Therapeutic Exercise: 8-22 mins                     Julien Girt, PT Acute Rehabilitation Services Pager [phone]  Office [place] 04/21/2019, 1:08 PM

## 2019-04-22 ENCOUNTER — Inpatient Hospital Stay (HOSPITAL_COMMUNITY): Payer: Managed Care, Other (non HMO)

## 2019-04-22 LAB — CULTURE, BLOOD (ROUTINE X 2)
Culture: NO GROWTH
Culture: NO GROWTH
Special Requests: ADEQUATE
Special Requests: ADEQUATE

## 2019-04-22 LAB — SARS CORONAVIRUS 2 BY RT PCR (HOSPITAL ORDER, PERFORMED IN ~~LOC~~ HOSPITAL LAB): SARS Coronavirus 2: NEGATIVE

## 2019-04-22 IMAGING — US CHEST ULTRASOUND
1 series · 8 of 8 positions shown · non-contrast
Comparison: Ultrasound-guided left-sided thoracentesis-[DATE];
chest CT-[DATE]; [DATE]

CLINICAL DATA: History of metastatic lung cancer with complex
left-sided pleural effusion. Request made for chest ultrasound and
guided thoracentesis as indicated.

Note, patient underwent attempted ultrasound-guided thoracentesis on
[DATE] yielding only 100 cc of pleural fluid secondary to
extensive loculations.
EXAM:
CHEST ULTRASOUND

[Series 1: chest ultrasound · 8 of 8 slices shown]
[im 1/8]
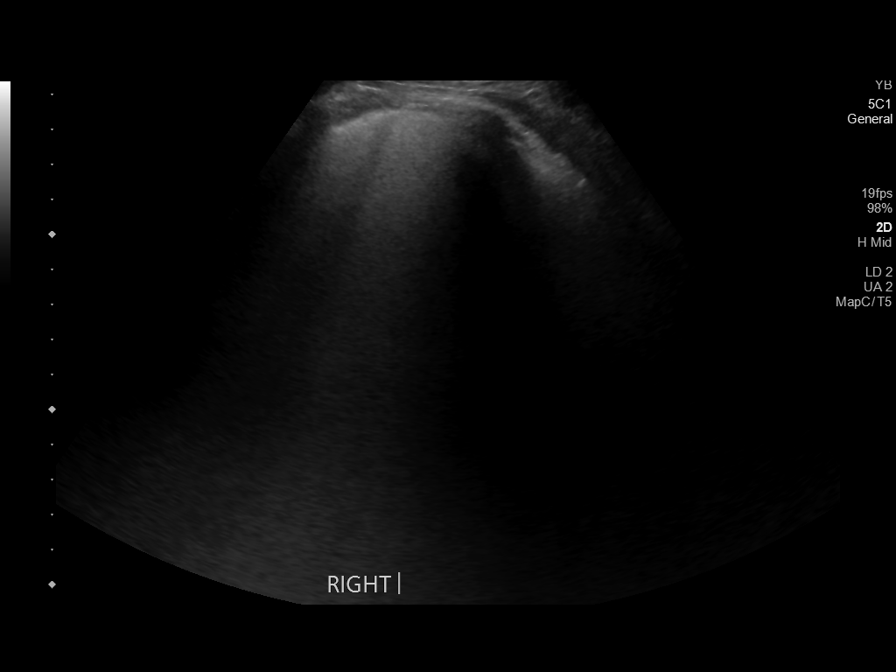
[im 2/8]
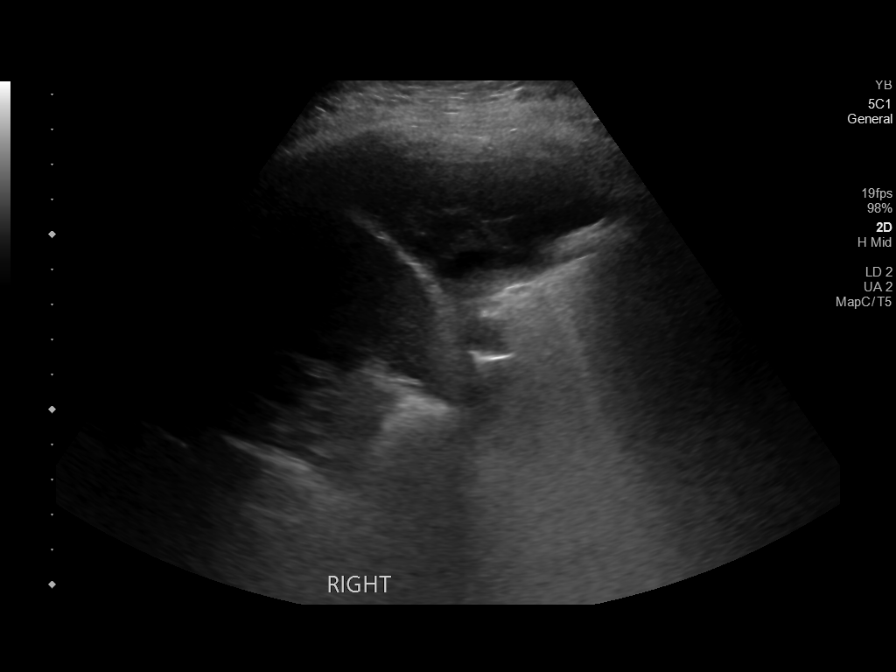
[im 3/8]
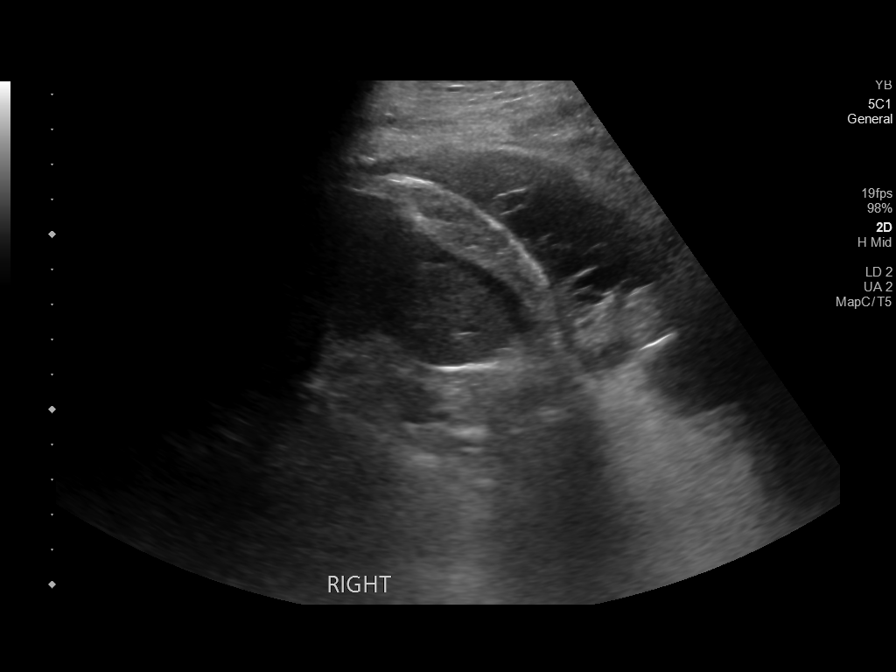
[im 4/8]
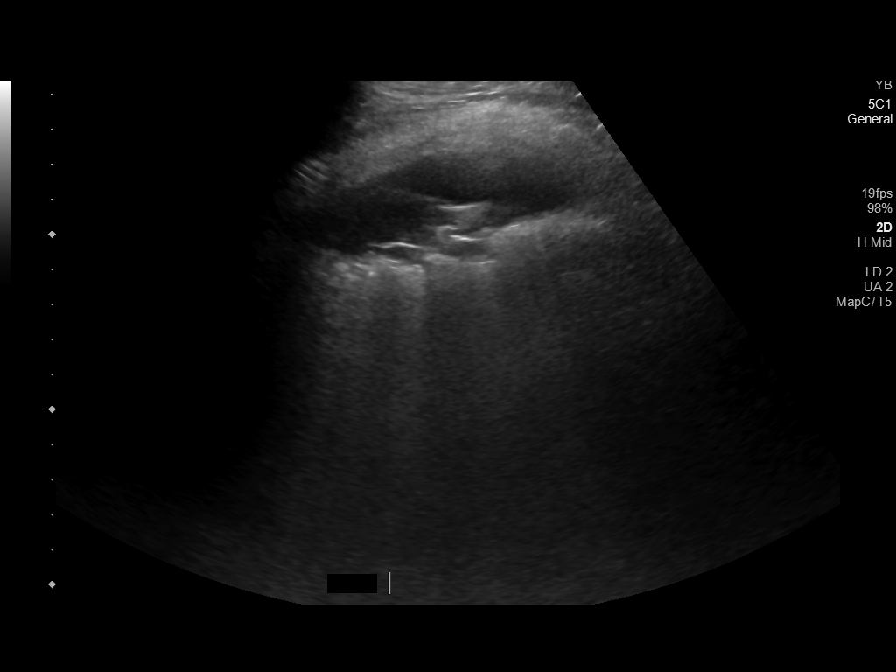
[im 5/8]
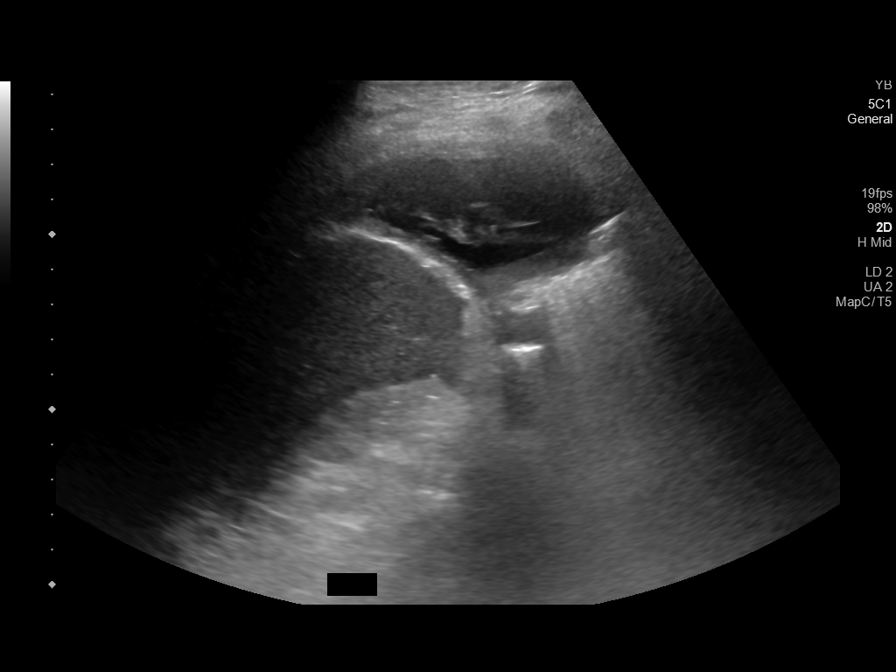
[im 6/8]
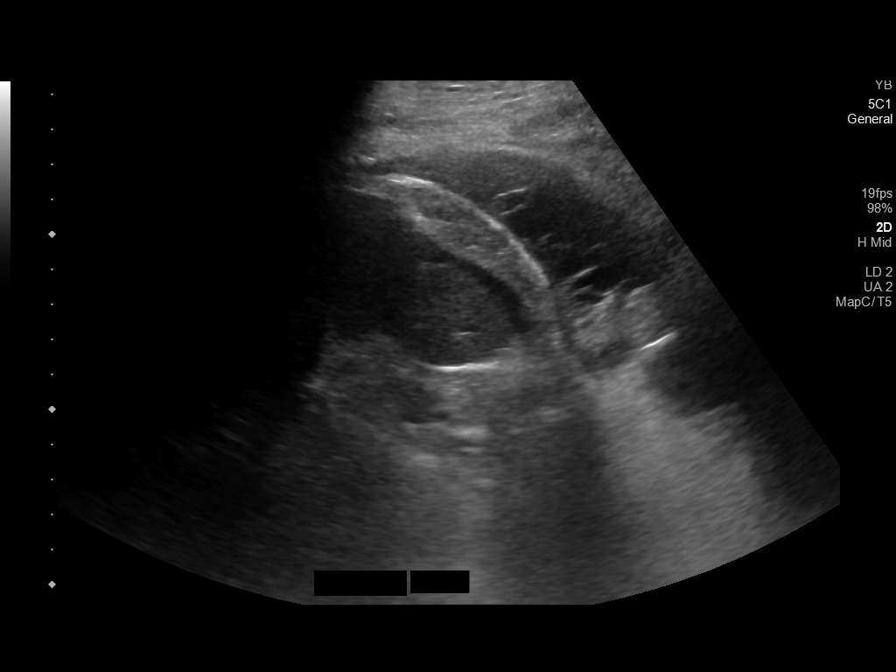
[im 7/8]
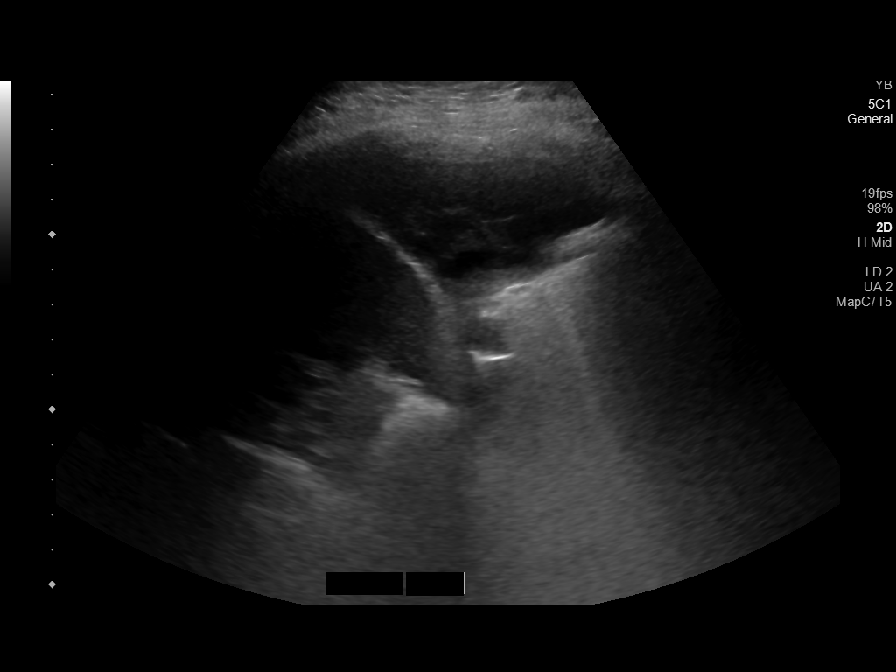
[im 8/8]
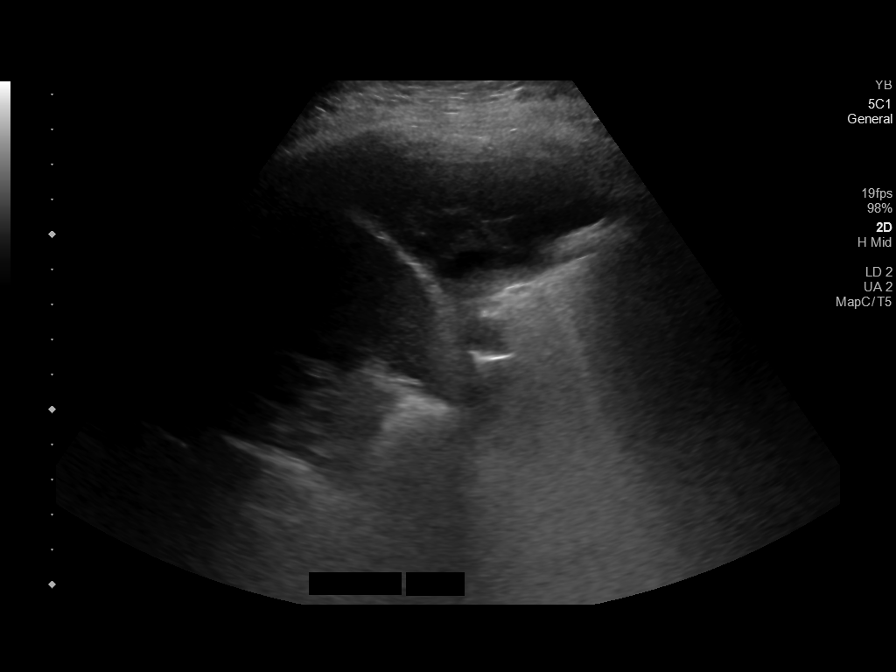

[8 of 8 positions shown; findings below may reference images not displayed]

FINDINGS: Sonographic evaluation of the right chest is negative for any
significant pleural fluid (image 3).

Sonographic evaluation of the left chest demonstrates multiple dense
loculations within small left-sided pleural effusion. The number and
conspicuity of the loculations has increased compared to the
[DATE] thoracentesis.

As such, patient not a candidate for ultrasound-guided
thoracentesis. No thoracentesis attempted.
IMPRESSION: Worsening loculations within small left-sided pleural effusion.
Number and conspicuity of loculation/increased compared to the
[DATE] thoracentesis and as such the patient is not a candidate
for ultrasound-guided thoracentesis.

No thoracentesis attempted.

## 2019-04-22 MED ORDER — LIDOCAINE HCL 1 % IJ SOLN
INTRAMUSCULAR | Status: AC
  Filled 2019-04-22: qty 20

## 2019-04-22 MED ORDER — SENNOSIDES-DOCUSATE SODIUM 8.6-50 MG PO TABS
2.0000 | ORAL_TABLET | Freq: Two times a day (BID) | ORAL | Status: DC
  Administered 2019-04-22 – 2019-04-23 (×2): 2 via ORAL
  Filled 2019-04-22 (×2): qty 2

## 2019-04-22 MED ORDER — GUAIFENESIN-DM 100-10 MG/5ML PO SYRP
10.0000 mL | ORAL_SOLUTION | ORAL | Status: DC | PRN
  Administered 2019-04-22 – 2019-04-23 (×3): 10 mL via ORAL
  Filled 2019-04-22 (×3): qty 10

## 2019-04-22 MED ORDER — POLYETHYLENE GLYCOL 3350 17 G PO PACK: 17.0000 g | PACK | Freq: Every day | ORAL | Status: DC

## 2019-04-22 MED ORDER — BENZONATATE 100 MG PO CAPS
100.0000 mg | ORAL_CAPSULE | Freq: Once | ORAL | Status: AC
  Administered 2019-04-22: 100 mg via ORAL

## 2019-04-22 NOTE — Plan of Care (Addendum)
Pt has poor intake. Pt needed an antiemetic at the beginning of shift. Pt has not had a BM since 7/6, however she refused meds for constipation on night shift, saying she would take something in the am. Biggest challenge on night shift was her cough. Pt got 100 Mg Tessalon and 5 Mg Tussionex @ 2108. She got 5 Mg Robitussin @ 2251. This was not effective, so on call was paged. NO was given for a 1 X 100 mg dose of Tessalon and her Robitussin was increased from 5 mg to 10 mg. I gave pt some hot tea and I put saline on her O2.  This has been mildly effective.

## 2019-04-22 NOTE — Progress Notes (Signed)
PROGRESS NOTE  Tina Cruz XNA:[id number] DOB: 21-Dec-1960 DOA: 04/16/2019 PCP: [name], MD  HPI/Recap of past 24 hours: 58 year old female with PMH of stage IV non-small cell lung cancer, adenocarcinoma with metastasis to bone, brain, right adrenal gland, malignant left pleural effusion, GERD, hypothyroid, presented with intractable nausea, nonbloody emesis that began on the afternoon of admission, associated with periumbilical abdominal pain which she thinks may be related to her nausea and vomiting, chronic cough and dyspnea but now with productive cough and dyspnea on minimal exertion, inability to keep anything down.  She was admitted for intractable nausea, vomiting, abdominal pain, splenic and right renal infarcts, suspected community-acquired pneumonia versus aspiration pneumonia and recurrent left malignant pleural effusion.  IR and oncology were consulted.  S/p 100 mL ultrasound-guided left thoracentesis.  04/22/19: Patient seen and examined at her bedside this morning.  Persistent cough is her main concern.  IR consulted for possible left thoracentesis.  She denies chest pain or dyspnea at rest.  Had some nausea early this morning.  She is willing to advance her diet to soft and possibly later to regular as tolerated.    Assessment/Plan: Principal Problem:   Pneumonia Active Problems:   GERD (gastroesophageal reflux disease)   Adenocarcinoma of left lung, stage 4 (HCC)   Recurrent left pleural effusion   Bone metastases (HCC)   Brain metastasis (HCC)   Splenic infarct   Lumbar compression fracture (HCC)   Renal infarct (HCC)   Abdominal pain   Nausea & vomiting  Intractable nausea, vomiting and abdominal pain Intermittent nausea improved with antiemetics No sign of obstruction on CT abdomen and pelvis Continue supportive care  Resolving acute hypoxic respiratory failure likely multifactorial secondary to malignant left pleural effusion from stage IV lung  cancer versus radiation changes versus community-acquired pneumonia O2 saturation 93% on room air on the monitor in the room Not on oxygen supplementation at baseline. Continue to maintain O2 saturation greater than 92% Continue nebs as needed Patient is allergic to albuterol Independently reviewed chest x-ray done on 04/17/2017 which showed extensive consolidation throughout the left lung field.  Right lung field is essentially clear. Home O2 evaluation prior to discharge Repeat chest x-ray on 04/21/2019 due to persistent cough independently reviewed showed worsening left sided pleural effusion. Due to persistent cough, IR consulted for possible left thoracentesis in order to improve symptomatology  Persistently intractable cough likely secondary to left pleural effusion versus lung cancer Continue to treat symptomatically On Tussionex and Tessalon perles IR consulted for possible left thoracentesis  Community-acquired pneumonia versus post radiation changes Completed 5 days of Rocephin and IV azithromycin Procalcitonin 0.14 on 04/19/2019 Afebrile with no leukocytosis  Acute dehydration secondary to poor oral intake Stopped IV fluid on 04/21/2019 due to worsening left pleural effusion Patient advised to increase oral intake Continue oral supplement  Splenic and right renal infarct in the setting of stage IV lung cancer on Eliquis Previously on Eliquis with compliance. Oncology recommends subcu Lovenox full dose Continue subcu Lovenox 100 mg daily started on 04/18/2019 No valvular lesions on TTE  Recurrent malignant L pleural affusion Post previously left thoracentesis on 04/17/2019 with 100 cc of bloody pleural fluid removed Cytology RARE MALIGNANT CELLS CONSISTENT WITH ADENOCARCINOMA IR consulted for possible repeat thoracentesis on 04/22/2019.  L3 compression fracture Likely pathologic compression fracture in the setting of stage IV lung cancer with bone mets  Stage IV non-small cell  lung cancer, adenocarcinoma with brain mets  Oncology consultation appreciated.  Continue treatment  with [place], patient will have to bring her home supplies.  Patient reportedly has an appointment with Dr. Isidore Moos, Radiation oncology on 7/7, please contact them in a.m.  I reviewed patient's recent MRI brain 04/03/2019 results with her spouse, 2 intracranial metastasis are decreasing in size but skull metastasis are enlarging.  Per oncology her treated brain mets responded well to the radiation treatment and there are no new lesions.  The plan is to repeat a brain MRI in 3 months and follow-up with neurosurgeon.  History of pulmonary embolism Continue full dose subcu Lovenox daily  Prolonged QTC  QTC improved from 497 on admission EKG to 475 on 7/6.  Minimize QT prolonging medications.  Monitor EKG periodically.  Physical debility/ambulatory dysfunction PT OT assessed and recommended home health PT OT Continue physical therapy DME 3 in 1 bedside commode  Fall precautions    DVT prophylaxis:  Full dose subcu Lovenox Code Status: Full Family Communication:  Will call family if okay with the patient.    Disposition:  DC to home with home health services possibly in 2 to 3 days when clinically stable.   Consultants:  Medical oncology IR  Procedures:  Ultrasound-guided left thoracentesis by IR 7/6.  Antimicrobials:  IV ceftriaxone and azithromycin  Objective: Vitals:   04/20/19 2054 04/21/19 0551 04/21/19 1442 04/22/19 0501  BP: (!) 143/71 121/76 107/74 111/71  Pulse: (!) 110 (!) 101 96 93  Resp: 18 18 18 18   Temp: 98.4 F (36.9 C) 98.9 F (37.2 C) 98.4 F (36.9 C) 98.8 F (37.1 C)  TempSrc: Oral Oral  Oral  SpO2: 98% 94% 96% 92%  Weight:      Height:        Intake/Output Summary (Last 24 hours) at 04/22/2019 1104 Last data filed at 04/22/2019 0902 Gross per 24 hour  Intake 225 ml  Output 400 ml  Net -175 ml   Filed Weights   04/16/19 2043 04/17/19  0028 04/17/19 0433  Weight: 70.3 kg 68 kg 67.2 kg    Exam:   General: 58 y.o. year-old female well-developed well-nourished.  Appears fatigued.  Cardiovascular: Regular rate and rhythm no rubs or gallops.  No JVD or thyromegaly.    Respiratory: Mild rales at bases no wheezes noted.  Poor inspiratory effort.  Abdomen: Soft nontender nondistended normal bowel sounds noted.  Musculoskeletal: Trace lower extremity edema.  2 out of 4 pulses in all 4 extremities.  Psychiatry: Mood is appropriate for condition and setting.   Data Reviewed: CBC: Recent Labs  Lab 04/16/19 2103 04/17/19 0448 04/18/19 0425 04/21/19 0623  WBC 10.8* 10.6* 11.8* 8.2  NEUTROABS 9.5*  --   --  6.7  HGB 13.1 12.6 11.6* 10.0*  HCT 39.0 37.7 35.4* 31.1*  MCV 88.4 89.1 91.7 90.1  PLT 171 155 122* 588*   Basic Metabolic Panel: Recent Labs  Lab 04/16/19 2103 04/17/19 0448 04/18/19 0425 04/21/19 0623  NA 142 140 137 136  K 3.8 3.8 4.4 3.8  CL 105 105 106 103  CO2 22 24 22 22   GLUCOSE 128* 134* 118* 114*  BUN 14 14 11 9   CREATININE 0.69 0.64 0.62 0.60  CALCIUM 9.5 9.2 8.8* 8.1*  MG  --  2.2  --  2.0  PHOS  --   --   --  2.6   GFR: Estimated Creatinine Clearance: 75.4 mL/min (by C-G formula based on SCr of 0.6 mg/dL). Liver Function Tests: Recent Labs  Lab 04/16/19 2103  AST 41  ALT  16  ALKPHOS 77  BILITOT 0.8  PROT 7.1  ALBUMIN 3.7   Recent Labs  Lab 04/16/19 2103  LIPASE 31   No results for input(s): AMMONIA in the last 168 hours. Coagulation Profile: Recent Labs  Lab 04/17/19 0448  INR 1.4*   Cardiac Enzymes: No results for input(s): CKTOTAL, CKMB, CKMBINDEX, TROPONINI in the last 168 hours. BNP (last 3 results) No results for input(s): PROBNP in the last 8760 hours. HbA1C: No results for input(s): HGBA1C in the last 72 hours. CBG: Recent Labs  Lab 04/17/19 0507  GLUCAP 119*   Lipid Profile: No results for input(s): CHOL, HDL, LDLCALC, TRIG, CHOLHDL, LDLDIRECT in the  last 72 hours. Thyroid Function Tests: No results for input(s): TSH, T4TOTAL, FREET4, T3FREE, THYROIDAB in the last 72 hours. Anemia Panel: No results for input(s): VITAMINB12, FOLATE, FERRITIN, TIBC, IRON, RETICCTPCT in the last 72 hours. Urine analysis:    Component Value Date/Time   COLORURINE YELLOW 04/16/2019 2103   APPEARANCEUR HAZY (A) 04/16/2019 2103   LABSPEC 1.017 04/16/2019 2103   PHURINE 7.0 04/16/2019 2103   GLUCOSEU NEGATIVE 04/16/2019 2103   HGBUR MODERATE (A) 04/16/2019 2103   BILIRUBINUR NEGATIVE 04/16/2019 2103   KETONESUR 80 (A) 04/16/2019 2103   PROTEINUR 30 (A) 04/16/2019 2103   NITRITE NEGATIVE 04/16/2019 2103   LEUKOCYTESUR TRACE (A) 04/16/2019 2103   Sepsis Labs: @LABRCNTIP (procalcitonin:4,lacticidven:4)  ) Recent Results (from the past 240 hour(s))  Blood culture (routine x 2)     Status: None (Preliminary result)   Collection Time: 04/16/19  9:03 PM   Specimen: BLOOD  Result Value Ref Range Status   Specimen Description   Final    BLOOD Performed at [facility], [place] [address] [place], [place] [address]    Special Requests   Final    BOTTLES DRAWN AEROBIC AND ANAEROBIC Blood Culture adequate volume Performed at [place] [address], [place], [place] [address]    Culture   Final    NO GROWTH 4 DAYS Performed at [facility], [place] [address], [place], [place] [address]    Report Status PENDING  Incomplete  SARS Coronavirus 2 (CEPHEID - Performed in [place] hospital lab), Hosp Order     Status: None   Collection Time: 04/17/19 12:22 AM   Specimen: Urine, Clean Catch; Nasopharyngeal  Result Value Ref Range Status   SARS Coronavirus 2 NEGATIVE NEGATIVE Final    Comment: (NOTE) If result is NEGATIVE SARS-CoV-2 target nucleic acids are NOT DETECTED. The SARS-CoV-2 RNA is generally detectable in upper and lower  respiratory specimens during the acute phase of infection. The lowest    concentration of SARS-CoV-2 viral copies this assay can detect is 250  copies / mL. A negative result does not preclude SARS-CoV-2 infection  and should not be used as the sole basis for treatment or other  patient management decisions.  A negative result may occur with  improper specimen collection / handling, submission of specimen other  than nasopharyngeal swab, presence of viral mutation(s) within the  areas targeted by this assay, and inadequate number of viral copies  (<250 copies / mL). A negative result must be combined with clinical  observations, patient history, and epidemiological information. If result is POSITIVE SARS-CoV-2 target nucleic acids are DETECTED. The SARS-CoV-2 RNA is generally detectable in upper and lower  respiratory specimens dur ing the acute phase of infection.  Positive  results are indicative of active infection with SARS-CoV-2.  Clinical  correlation with patient history and other diagnostic information is  necessary to determine patient infection status.  Positive results do  not rule out bacterial infection or co-infection with other viruses. If result is PRESUMPTIVE POSTIVE SARS-CoV-2 nucleic acids MAY BE PRESENT.   A presumptive positive result was obtained on the submitted specimen  and confirmed on repeat testing.  While 2019 novel coronavirus  (SARS-CoV-2) nucleic acids may be present in the submitted sample  additional confirmatory testing may be necessary for epidemiological  and / or clinical management purposes  to differentiate between  SARS-CoV-2 and other Sarbecovirus currently known to infect humans.  If clinically indicated additional testing with an alternate test  methodology [id number]) is advised. The SARS-CoV-2 RNA is generally  detectable in upper and lower respiratory sp ecimens during the acute  phase of infection. The expected result is Negative. Fact Sheet for Patients:  StrictlyIdeas.no Fact Sheet  for Healthcare Providers: BankingDealers.co.za This test is not yet approved or cleared by the [place] FDA and has been authorized for detection and/or diagnosis of SARS-CoV-2 by FDA under an Emergency Use Authorization (EUA).  This EUA will remain in effect (meaning this test can be used) for the duration of the COVID-19 declaration under Section 564(b)(1) of the Act, 21 U.S.C. section 360bbb-3(b)(1), unless the authorization is terminated or revoked sooner. Performed at [facility], [place] [address], [place], [place] [address]   Urine culture     Status: None   Collection Time: 04/17/19 12:55 AM   Specimen: Urine, Random  Result Value Ref Range Status   Specimen Description   Final    URINE, RANDOM Performed at [place] [address] [place], [place] [address]    Special Requests   Final    NONE Performed at [facility], [place] [address], [place], [place] [address]    Culture   Final    NO GROWTH Performed at [facility], [place] [address], [place], [place] [address]    Report Status 04/18/2019 FINAL  Final  MRSA PCR Screening     Status: None   Collection Time: 04/17/19  4:39 AM   Specimen: Nasal Mucosa; Nasopharyngeal  Result Value Ref Range Status   MRSA by PCR NEGATIVE NEGATIVE Final    Comment:        The GeneXpert MRSA Assay (FDA approved for NASAL specimens only), is one component of a comprehensive MRSA colonization surveillance program. It is not intended to diagnose MRSA infection nor to guide or monitor treatment for MRSA infections. Performed at [facility], [place] [address] [place], [place] [address]   Blood culture (routine x 2)     Status: None (Preliminary result)   Collection Time: 04/17/19  4:48 AM   Specimen: BLOOD  Result Value Ref Range Status   Specimen Description   Final    BLOOD RIGHT ANTECUBITAL Performed at [place] [address] [place], [place] [address]    Special Requests   Final    BOTTLES DRAWN AEROBIC ONLY Blood Culture adequate volume Performed at [place] [address], [place], [place] [address]    Culture   Final    NO GROWTH 4 DAYS Performed at [facility], [place] [address], [place], [place] [address]    Report Status PENDING  Incomplete  Body fluid culture     Status: None   Collection Time: 04/17/19 12:03 PM  Specimen: Pleural, Left; Pleural Fluid  Result Value Ref Range Status   Specimen Description   Final    PLEURAL Performed at [facility], [address] [place], [place] [address]    Special Requests   Final    NONE Performed at [facility], [place] [address], [place], [place] [address]    Gram Stain   Final    RARE WBC PRESENT, PREDOMINANTLY MONONUCLEAR NO ORGANISMS SEEN    Culture   Final    NO GROWTH 3 DAYS Performed at [facility], [place] [address], [place], [place] [address]    Report Status 04/20/2019 FINAL  Final  Sputum culture     Status: None   Collection Time: 04/18/19  8:00 AM   Specimen: Sputum  Result Value Ref Range Status   Specimen Description SPUTUM  Final   Special Requests NONE  Final   Sputum evaluation   Final    THIS SPECIMEN IS ACCEPTABLE FOR SPUTUM CULTURE Performed at [facility], [place] [address] [place], [place] [address]    Report Status 04/18/2019 FINAL  Final  Culture, respiratory     Status: None   Collection Time: 04/18/19  8:00 AM   Specimen: SPU  Result Value Ref Range Status   Specimen Description   Final    SPUTUM Performed at [place] [address] [place], [place] [address]    Special Requests   Final    NONE Reflexed from [id number] Performed at [facility], [place] [address], [place], [place] [address]    Gram Stain   Final    FEW WBC PRESENT,BOTH PMN AND MONONUCLEAR FEW  GRAM VARIABLE ROD RARE GRAM POSITIVE COCCI IN PAIRS    Culture   Final    FEW Consistent with normal respiratory flora. Performed at [facility], [place] [address], [place], [place] [address]    Report Status 04/21/2019 FINAL  Final      Studies: Dg Chest Port 1 View  Result Date: 04/21/2019 CLINICAL DATA:  58 year old with left lung cancer, pneumonia and cough. EXAM: PORTABLE CHEST 1 VIEW COMPARISON:  Chest CT 04/17/2019 and chest radiograph 04/18/2019 FINDINGS: Persistent volume loss in the left hemithorax. There continues to be a large amount of airspace disease and consolidation in the left lung. Left lung disease may have mildly progressed in the apical region. Right lung remains clear. Heart and mediastinum are grossly stable. Negative for pneumothorax. IMPRESSION: Persistent airspace densities and consolidation in the left lung. Minimal change but there may be slight progression in the left apical region. Electronically Signed   By: Markus Daft M.D.   On: 04/21/2019 11:58    Scheduled Meds:  benzonatate  100 mg Oral BID   chlorpheniramine-HYDROcodone  5 mL Oral Q12H   enoxaparin (LOVENOX) injection  1.5 mg/kg Subcutaneous Q24H   feeding supplement  1 Container Oral TID BM   levothyroxine  112 mcg Oral Q0600   loratadine  10 mg Oral Daily   montelukast  10 mg Oral QHS   multivitamin with minerals  1 tablet Oral Daily   osimertinib mesylate  80 mg Oral Daily   pantoprazole (PROTONIX) IV  40 mg Intravenous Q24H    Continuous Infusions:    LOS: 5 days     Kayleen Memos, MD [facility] Pager [phone]  If 7PM-7AM, please contact night-coverage [email or web address] Password [facility] 04/22/2019, 11:04 AM

## 2019-04-23 LAB — CBC WITH DIFFERENTIAL/PLATELET
Abs Immature Granulocytes: 0.04 10*3/uL (ref 0.00–0.07)
Basophils Absolute: 0 10*3/uL (ref 0.0–0.1)
Basophils Relative: 0 %
Eosinophils Absolute: 0.2 10*3/uL (ref 0.0–0.5)
Eosinophils Relative: 2 %
HCT: 33.5 % — ABNORMAL LOW (ref 36.0–46.0)
Hemoglobin: 10.8 g/dL — ABNORMAL LOW (ref 12.0–15.0)
Immature Granulocytes: 1 %
Lymphocytes Relative: 9 %
Lymphs Abs: 0.7 10*3/uL (ref 0.7–4.0)
MCH: 29.3 pg (ref 26.0–34.0)
MCHC: 32.2 g/dL (ref 30.0–36.0)
MCV: 90.8 fL (ref 80.0–100.0)
Monocytes Absolute: 0.9 10*3/uL (ref 0.1–1.0)
Monocytes Relative: 11 %
Neutro Abs: 6.3 10*3/uL (ref 1.7–7.7)
Neutrophils Relative %: 77 %
Platelets: 177 10*3/uL (ref 150–400)
RBC: 3.69 MIL/uL — ABNORMAL LOW (ref 3.87–5.11)
RDW: 12.1 % (ref 11.5–15.5)
WBC: 8.2 10*3/uL (ref 4.0–10.5)
nRBC: 0 % (ref 0.0–0.2)

## 2019-04-23 LAB — PROCALCITONIN: Procalcitonin: <0.1 ng/mL

## 2019-04-23 MED ORDER — ENOXAPARIN SODIUM 120 MG/0.8ML ~~LOC~~ SOLN: 100.0000 mg | SUBCUTANEOUS | 0 refills | Status: AC

## 2019-04-23 MED ORDER — PANTOPRAZOLE SODIUM 40 MG PO TBEC
40.0000 mg | DELAYED_RELEASE_TABLET | Freq: Every day | ORAL | Status: DC
  Administered 2019-04-23: 11:00:00 40 mg via ORAL
  Filled 2019-04-23: qty 1

## 2019-04-23 MED ORDER — PANTOPRAZOLE SODIUM 40 MG PO TBEC: 40.0000 mg | DELAYED_RELEASE_TABLET | Freq: Every day | ORAL | 0 refills | Status: DC

## 2019-04-23 MED ORDER — ENOXAPARIN SODIUM 120 MG/0.8ML ~~LOC~~ SOLN: 100.0000 mg | SUBCUTANEOUS | 0 refills | Status: DC

## 2019-04-23 MED ORDER — HYDROCOD POLST-CPM POLST ER 10-8 MG/5ML PO SUER: 5.0000 mL | Freq: Two times a day (BID) | ORAL | 0 refills | Status: AC | PRN

## 2019-04-23 MED ORDER — ADULT MULTIVITAMIN W/MINERALS CH: 1.0000 | ORAL_TABLET | Freq: Every day | ORAL | 0 refills | Status: AC

## 2019-04-23 MED ORDER — ADULT MULTIVITAMIN W/MINERALS CH: 1.0000 | ORAL_TABLET | Freq: Every day | ORAL | 0 refills | Status: DC

## 2019-04-23 MED ORDER — SENNOSIDES-DOCUSATE SODIUM 8.6-50 MG PO TABS: 2.0000 | ORAL_TABLET | Freq: Two times a day (BID) | ORAL | 0 refills | Status: DC

## 2019-04-23 MED ORDER — OXYCODONE HCL 5 MG PO TABS: 5.0000 mg | ORAL_TABLET | Freq: Two times a day (BID) | ORAL | 0 refills | Status: DC | PRN

## 2019-04-23 MED ORDER — BENZONATATE 150 MG PO CAPS: 150.0000 mg | ORAL_CAPSULE | Freq: Two times a day (BID) | ORAL | 0 refills | Status: DC | PRN

## 2019-04-23 MED ORDER — HYDROCOD POLST-CPM POLST ER 10-8 MG/5ML PO SUER: 5.0000 mL | Freq: Two times a day (BID) | ORAL | 0 refills | Status: DC | PRN

## 2019-04-23 NOTE — Discharge Summary (Addendum)
Discharge Summary  Tina Cruz WNU:[id number] DOB: 04/22/1961  PCP: [name], MD  Admit date: 04/16/2019 Discharge date: 04/23/2019  Time spent: 35 minutes  Recommendations for Outpatient Follow-up:  1. Follow-up with heme oncology Dr. Earlie Server 2. Follow-up with your primary care provider 3. Take your medications as prescribed 4. Continue physical therapy 5. Fall precautions  Discharge Diagnoses:  Active Hospital Problems   Diagnosis Date Noted   Pneumonia 04/17/2019   Splenic infarct 04/17/2019   Lumbar compression fracture ([facility]) 04/17/2019   Renal infarct ([facility]) 04/17/2019   Abdominal pain 04/17/2019   Nausea & vomiting 04/17/2019   Adenocarcinoma of left lung, stage 4 ([facility]) 12/14/2018   Recurrent left pleural effusion 12/14/2018   Brain metastasis ([facility]) 12/14/2018   Bone metastases ([facility]) 12/14/2018   GERD (gastroesophageal reflux disease) 01/14/2015    Resolved Hospital Problems  No resolved problems to display.    Discharge Condition: Stable  Diet recommendation: Resume previous diet  Vitals:   04/22/19 2046 04/23/19 0524  BP: 112/76 107/65  Pulse: 99 92  Resp: 18 18  Temp: 98.6 F (37 C) 98.4 F (36.9 C)  SpO2: 97% (!) 89%    History of present illness:  58 year old female with PMH of stage IV non-small cell lung cancer, adenocarcinoma with metastasis to bone, brain, right adrenal gland, malignant left pleural effusion, GERD, hypothyroid, presented with intractable nausea, nonbloody emesis that began on the afternoon of admission, associated with periumbilical abdominal pain which she thinks may be related to her nausea and vomiting, chronic cough and dyspnea but now with productive cough and dyspnea on minimal exertion, inability to keep anything down. She was admitted for intractable nausea, vomiting, abdominal pain, splenic and right renal infarcts, suspected community-acquired pneumonia versus aspiration pneumonia and recurrent  left malignant pleural effusion. IR and oncology were consulted. S/p 100 mL ultrasound-guided left thoracentesis 04/17/19.  04/22/19: Patient seen and examined at her bedside this morning.  Persistent cough is her main concern.  IR consulted for possible left thoracentesis.  She denies chest pain or dyspnea at rest.  Had some nausea early this morning.  She is willing to advance her diet to soft and possibly later to regular as tolerated.    Hospital Course:  Principal Problem:   Pneumonia Active Problems:   GERD (gastroesophageal reflux disease)   Adenocarcinoma of left lung, stage 4 (HCC)   Recurrent left pleural effusion   Bone metastases (HCC)   Brain metastasis (HCC)   Splenic infarct   Lumbar compression fracture (HCC)   Renal infarct (HCC)   Abdominal pain   Nausea & vomiting  Resolved intractable nausea, vomiting and abdominal pain No sign of obstruction on CT abdomen and pelvis Able to tolerate a soft diet without difficulty. Encouraged to increase oral protein calorie intake.  Resolving acute hypoxic respiratory failure likely multifactorial secondary to malignant left pleural effusion from stage IV lung cancer versus radiation changes versus community-acquired pneumonia O2 saturation 93% on room air on the monitor in the room Not on oxygen supplementation at baseline. Independently reviewed chest x-ray done on 04/17/2017 which showed extensive consolidation throughout the left lung field.  Right lung field is essentially clear. Repeat chest x-ray on 04/21/2019 due to persistent cough independently reviewed showed worsening left sided pleural effusion. Due to persistent cough, IR consulted for possible left thoracentesis in order to improve symptomatology.  Not amenable due to loculated effusion. Home O2 evaluation prior to discharge.  Improving intractable cough likely secondary to left pleural effusion  versus lung cancer Continue to treat symptomatically Continue  Tussionex and Tessalon perles as needed  Resolved community-acquired pneumonia versus post radiation changes Completed 5 days of Rocephin and IV azithromycin Procalcitonin 0.14 on 04/19/2019 Vital signs and labs reviewed and are stable Afebrile with no leukocytosis  Improving acute dehydration secondary to poor oral intake Stopped IV fluid on 04/21/2019  Patient advised to increase oral intake  Splenic and right renal infarct in the setting of stage IV lung cancer on Eliquis Previously on Eliquis with compliance. Oncology recommends subcu Lovenox full dose Continue subcu Lovenox 100 mg daily started on 04/18/2019 No valvular lesions on TTE  Recurrent malignant L pleural affusion Post previously left thoracentesis on 04/17/2019 with 100 cc of bloody pleural fluid removed Cytology RARE MALIGNANT CELLS CONSISTENT WITH ADENOCARCINOMA IR consulted for possible repeat thoracentesis on 04/22/2019.  Not amenable due to loculated effusion.  L3 compression fracture Likely pathologic compression fracture in the setting of stage IV lung cancer with bone mets  Stage IV non-small cell lung cancer, adenocarcinoma with brain mets  On Tagrisso  Follow-up with oncology and radiation oncology.  Per oncology her treated brain mets responded well to the radiation treatment and there are no new lesions.  The plan is to repeat a brain MRI in 3 months and follow-up with neurosurgeon.  History of pulmonary embolism Continue full dose subcu Lovenox daily  Prolonged QTC  QTC improved from 497 on admission EKG to 475 on 7/6.  Minimize QT prolonging medications. Monitor EKG periodically.  Repeat twelve-lead EKG on 04/23/2019  Physical debility/ambulatory dysfunction PT OT assessed and recommended home health PT OT Continue physical therapy DME 3 in 1 bedside commode  Fall precautions  GERD Continue Protonix p.o. daily     Code Status:Full   Consultants: Medical  oncology IR  Procedures: Ultrasound-guided left thoracentesis by IR 7/6.  Antimicrobials: IV ceftriaxone and azithromycin   Discharge Exam: BP 107/65 (BP Location: Right Arm)    Pulse 92    Temp 98.4 F (36.9 C) (Oral)    Resp 18    Ht 5\' 7"  (1.702 m)    Wt 67.2 kg    LMP  (LMP Unknown)    SpO2 (!) 89%    BMI 23.21 kg/m   General: 58 y.o. year-old female pleasant, well developed well nourished in no acute distress.  Alert and oriented x3.  Cardiovascular: Regular rate and rhythm with no rubs or gallops.  No thyromegaly or JVD noted.    Respiratory: Faint mild rales on left upper lobe.  No wheezing.  Poor inspiratory effort.    Abdomen: Soft nontender nondistended with normal bowel sounds x4 quadrants.  Musculoskeletal: No lower extremity edema. 2/4 pulses in all 4 extremities.  Psychiatry: Mood is appropriate for condition and setting  Discharge Instructions You were cared for by a hospitalist during your hospital stay. If you have any questions about your discharge medications or the care you received while you were in the hospital after you are discharged, you can call the unit and asked to speak with the hospitalist on call if the hospitalist that took care of you is not available. Once you are discharged, your primary care physician will handle any further medical issues. Please note that NO REFILLS for any discharge medications will be authorized once you are discharged, as it is imperative that you return to your primary care physician (or establish a relationship with a primary care physician if you do not have one) for your aftercare needs  so that they can reassess your need for medications and monitor your lab values.   Allergies as of 04/23/2019      Reactions   Albuterol Hives, Itching      Medication List    STOP taking these medications   cephALEXin 500 MG capsule Commonly known as: KEFLEX   Eliquis 5 MG Tabs tablet Generic drug: apixaban   famotidine 20  MG tablet Commonly known as: PEPCID   sucralfate 1 GM/10ML suspension Commonly known as: CARAFATE     TAKE these medications   acetaminophen 325 MG tablet Commonly known as: TYLENOL Take 325 mg by mouth every 6 (six) hours as needed for mild pain or headache. Notes to patient: 04/23/19   azelastine 0.1 % nasal spray Commonly known as: ASTELIN Place 1 spray into both nostrils 2 (two) times daily as needed for rhinitis. Use in each nostril as directed What changed: additional instructions Notes to patient: 04/23/19   Benzonatate 150 MG Caps Take 1 capsule (150 mg total) by mouth 2 (two) times daily as needed. What changed:   medication strength  how much to take  when to take this  reasons to take this   Cetirizine HCl 10 MG Caps Commonly known as: ZyrTEC Allergy Take 1 capsule (10 mg total) by mouth daily as needed (allergies). What changed: when to take this Notes to patient: 04/23/19   chlorpheniramine-HYDROcodone 10-8 MG/5ML Suer Commonly known as: TUSSIONEX Take 5 mLs by mouth every 12 (twelve) hours as needed for cough.   enoxaparin 120 MG/0.8ML injection Commonly known as: Lovenox Inject 0.67 mLs (100 mg total) into the skin daily.   levalbuterol 45 MCG/ACT inhaler Commonly known as: XOPENEX HFA Inhale 1-2 puffs into the lungs every 4 (four) hours as needed for wheezing or shortness of breath. Notes to patient: 04/23/19   levothyroxine 112 MCG tablet Commonly known as: SYNTHROID Take 1 tablet (112 mcg total) by mouth daily before breakfast. Notes to patient: 04/24/19   magnesium citrate Soln Take 0.5 Bottles by mouth daily as needed for moderate constipation or severe constipation.   montelukast 10 MG tablet Commonly known as: SINGULAIR Take 10 mg by mouth at bedtime. Notes to patient: 04/23/19   multivitamin with minerals Tabs tablet Take 1 tablet by mouth daily.   oxyCODONE 5 MG immediate release tablet Commonly known as: Oxy IR/ROXICODONE Take 1  tablet (5 mg total) by mouth 2 (two) times daily as needed for moderate pain or severe pain.   pantoprazole 40 MG tablet Commonly known as: PROTONIX Take 1 tablet (40 mg total) by mouth daily.   polyethylene glycol 17 g packet Commonly known as: MIRALAX / GLYCOLAX Take 17 g by mouth daily. Notes to patient: 04/23/19   senna-docusate 8.6-50 MG tablet Commonly known as: Senokot-S Take 2 tablets by mouth 2 (two) times daily.   Tagrisso 80 MG tablet Generic drug: osimertinib mesylate TAKE 1 TABLET DAILY What changed: how much to take Notes to patient: 04/23/19            Durable Medical Equipment  (From admission, onward)         Start     Ordered   04/23/19 1154  For home use only DME oxygen  Once    Question Answer Comment  Length of Need 6 Months   Mode or (Route) Nasal cannula   Liters per Minute 3   Frequency Continuous (stationary and portable oxygen unit needed)   Oxygen conserving device Yes   Oxygen delivery  system Gas      04/23/19 1153   04/21/19 0525  For home use only DME 3 n 1  Once     04/21/19 0524         Allergies  Allergen Reactions   Albuterol Hives and Itching   Follow-up Information    Buelah Manis, Modena Nunnery, MD. Call in 1 day(s).   Specialty: Family Medicine Why: Please call for a post hospital follow-up appointment. Contact information: [place] [address] [place] [address] [phone]        [name], MD. Call in 1 day(s).   Specialty: Oncology Why: Please call for a post hospital follow-up appointment. Contact information: [place] [address] [phone]        Eppie Gibson, MD. Call in 1 day(s).   Specialty: Radiation Oncology Why: Please call for a post hospital follow-up appointment. Contact information: [place]. [place] [address] [phone]            The results of significant diagnostics from this hospitalization (including imaging, microbiology,  ancillary and laboratory) are listed below for reference.    Significant Diagnostic Studies: Ct Chest Wo Contrast  Result Date: 04/17/2019 CLINICAL DATA:  Pt s/p thoracentesis. "medical history significant of metastatic non-small cell lung cancer, with metastasis to bone, brain, right adrenal gland and malignant left pleural effusion presents with persistent nausea and vomiting since 3 a.m. today. Periumbilical pain without radiation. Chronic cough and shortness of breath, similar to baseline. EXAM: CT CHEST WITHOUT CONTRAST TECHNIQUE: Multidetector CT imaging of the chest was performed following the standard protocol without IV contrast. COMPARISON:  CT of the chest on 03/24/2019 FINDINGS: Cardiovascular: Heart size is normal. There is atherosclerotic calcification of the coronary arteries. Normal noncontrast appearance of the pulmonary arteries. There is minimal atherosclerosis of the thoracic aorta, not associated with aneurysm. Mediastinum/Nodes: The visualized portion of the thyroid gland has a normal appearance. Esophagus is normal in appearance. Enlarged LEFT supraclavicular lymph nodes, largest 1.4 centimeters. Previously, largest was 1.0 centimeters. Small mediastinal and hilar lymph nodes, none of which reach criteria for enlargement. Lungs/Pleura: There is no pneumothorax following thoracentesis. Persistent pleural collection identified posteriorly at the LEFT lung base. Although the study is not performed with intravenous contrast, suspect pleural thickening particularly at the LEFT lung base. Focal consolidations/mass in the LEFT lung apex is 1.6 x 2.2 centimeters, previously 2.3 x 1.8 centimeters. Extensive consolidations seen throughout the LEFT UPPER lobe with a extensive air bronchograms. Ground-glass opacities are identified in the LEFT LOWER lobe. The LEFT UPPER lobe opacities are more confluent when compared with the prior study. There is focal consolidations/atelectasis at the MEDIAL RIGHT  lung base, associated with air bronchograms and new since the prior study. The RIGHT lung is hyperinflated. Upper Abdomen: Contrast identified within the gallbladder and collecting systems of the kidneys. Colonic diverticulosis is noted. The adrenal glands are normal in appearance. Musculoskeletal: Numerous blastic lesions within the thoracic and UPPER lumbar spine, ribs, and LEFT scapula. Stable compression fracture of T12. IMPRESSION: 1. No pneumothorax following thoracentesis. 2. Persistent pleural collection and pleural thickening at the LEFT lung base. 3. Interval development of focal consolidation RIGHT lung base. 4. More confluent opacity within the LEFT UPPER lobe. 5. Interval enlargement of LEFT supraclavicular lymph nodes, suspicious for metastatic disease. 6. Stable compression fracture of T12. Sclerotic osseous metastases. 7. Coronary artery disease. 8. Colonic diverticulosis. 9.  Aortic atherosclerosis.  (ICD10-I70.0) Electronically Signed   By: Benjamine Mola  Owens Shark M.D.   On: 04/17/2019 12:31   Ct Chest W Contrast  Result Date: 03/24/2019 CLINICAL DATA:  Stage IV left upper lobe non-small cell lung cancer status post targeted chemotherapy, palliative radiotherapy to the brain, lung mass and bone metastases and left PleurX catheter placement. Restaging. EXAM: CT CHEST, ABDOMEN, AND PELVIS WITH CONTRAST TECHNIQUE: Multidetector CT imaging of the chest, abdomen and pelvis was performed following the standard protocol during bolus administration of intravenous contrast. CONTRAST:  19mL OMNIPAQUE IOHEXOL 300 MG/ML  SOLN COMPARISON:  01/28/2019 chest CT angiogram. 12/12/2018 PET-CT. 11/11/2018 chest CT. FINDINGS: CT CHEST FINDINGS Cardiovascular: Normal heart size. No significant pericardial effusion/thickening. Atherosclerotic nonaneurysmal thoracic aorta. Normal caliber pulmonary arteries. No central pulmonary emboli. Mediastinum/Nodes: No discrete thyroid nodules. Unremarkable esophagus. No axillary  adenopathy. Multiple mildly enlarged left supraclavicular nodes, largest 1.2 cm (series 2/image 5), previously 1.0 cm on 01/27/2019 chest CT, minimally increased. No pathologically enlarged mediastinal or hilar nodes. Stable calcified left suprahilar 0.7 cm short axis node. Lungs/Pleura: No pneumothorax. Interval removal of left PleurX catheter. Small loculated dependent left pleural effusion with mildly irregular left pleural thickening, not definitely changed. No right pleural effusion. Improved aeration in the left upper lobe. Irregular nodular 2.3 x 1.8 cm focus of consolidation in the left upper lobe (series 5/image 42), previously obscured by atelectasis and consolidation on 01/27/2019 chest CT. Sharply marginated extensive left perihilar consolidation with associated extensive patchy ground-glass opacity throughout the left lung, largely new in the interval. Mild compressive atelectasis in the dependent left lower lobe. No acute consolidative airspace disease, lung masses or significant pulmonary nodules in the right lung. Musculoskeletal: There are widespread sclerotic osseous lesions throughout thoracic spine, bilateral ribs and bilateral scapula, which have significantly increased in sclerosis since 01/27/2019, for example in the anterior T8 vertebral body and in the upper left scapula (series 6/image 100). No definite new osseous lesions in the chest. Mild superior T12 vertebral compression fracture is new since 01/27/2019 chest CT. Moderate thoracic spondylosis. CT ABDOMEN PELVIS FINDINGS Hepatobiliary: Normal liver with no liver mass. Normal gallbladder with no radiopaque cholelithiasis. No biliary ductal dilatation. Pancreas: Low-attenuation 1.7 cm pancreatic neck lesion with coarse internal calcification (series 2/image 60), not appreciably changed since 11/11/2018 CT. No new pancreatic lesions. No pancreatic duct dilation. Spleen: Normal size. No mass. Adrenals/Urinary Tract: Normal adrenals. Normal  kidneys with no hydronephrosis and no renal mass. Normal collapsed bladder. Stomach/Bowel: Small hiatal hernia. Otherwise normal nondistended stomach. Normal caliber small bowel with no small bowel wall thickening. Normal appendix. Oral contrast transits to the colon. Mild left colonic diverticulosis, with no large bowel wall thickening or significant pericolonic fat stranding. Vascular/Lymphatic: Atherosclerotic nonaneurysmal abdominal aorta. Patent portal, splenic, hepatic and renal veins. No pathologically enlarged lymph nodes in the abdomen or pelvis. Reproductive: Grossly normal uterus.  No adnexal mass. Other: No pneumoperitoneum, ascites or focal fluid collection. Musculoskeletal: There are widespread sclerotic osseous lesions throughout the lumbar spine, sacrum, bilateral iliac bones and proximal femora bilaterally, which are significantly increased in sclerosis compared to the CT images from 12/12/2018 PET-CT study, correlating with the areas of skeletal hypermetabolism on that PET-CT study. No definite new focal osseous lesions. Mild superior L4 vertebral compression fracture is probably stable and chronic compared to the coronal reformats on 12/12/2018 PET-CT. Moderate lumbar spondylosis. IMPRESSION: 1. Widespread sclerotic osseous metastases throughout axial and proximal appendicular skeleton, significantly increased in sclerosis in the interval, a nonspecific change that probably reflects treatment effect. No definite new osseous lesions. 2. Mild left supraclavicular  lymphadenopathy is slightly increased since 01/27/2019 chest CT angiogram study. 3. Aeration in the left upper lobe is improved in the interval. Irregular nodular focus of consolidation in the left upper lobe was previously obscured by consolidation/atelectasis, limiting comparison. New extensive sharply marginated perihilar consolidation and ground-glass opacity in the left lung. Findings are nonspecific but favored to represent evolving  postradiation change. Close chest CT follow-up recommended. 4. Stable loculated small left pleural effusion with left pleural thickening. 5. Low-attenuation pancreatic neck lesion with internal coarse calcification is stable since 11/11/2018 CT, probably nonaggressive cystic pancreatic lesion. 6.  Aortic Atherosclerosis (ICD10-I70.0). Electronically Signed   By: Ilona Sorrel M.D.   On: 03/24/2019 17:48   Mr Jeri Cos HK Contrast  Result Date: 04/03/2019 CLINICAL DATA:  Restaging of non-small cell lung cancer. Two brain metastases treated with SRS on 12/30/2018. EXAM: MRI HEAD WITHOUT AND WITH CONTRAST TECHNIQUE: Multiplanar, multiecho pulse sequences of the brain and surrounding structures were obtained without and with intravenous contrast. CONTRAST:  36mL MULTIHANCE GADOBENATE DIMEGLUMINE 529 MG/ML IV SOLN COMPARISON:  12/25/2018 FINDINGS: BRAIN New Lesions: None. Larger lesions: None. Stable or Smaller lesions: 1. Decreased size of 5 mm right frontoparietal lesion (series 11, image 111, previously 14 mm) with nearly completely resolved edema and new chronic blood products. 2. At most faint residual enhancement corresponding to the suspected 2 mm left frontal metastasis on the prior study (series 11, image 87 and series 13, image 27) Other Brain findings: There is no evidence of acute infarct, midline shift, or extra-axial fluid collection. The ventricles and sulci are within normal limits. No age advanced cerebral white matter disease is evident. Vascular: Major intracranial vascular flow voids are preserved. Skull and upper cervical spine: Increased size of multiple skull metastases without significant extraosseous extension. Sinuses/Orbits: Unremarkable orbits. Clear paranasal sinuses. Moderate right mastoid effusion. Other: None. IMPRESSION: 1. Decreased size of the 2 treated brain metastases. 2. No evidence of new brain metastases or acute intracranial abnormality. 3. Interval enlargement of multiple skull  metastases. Electronically Signed   By: Logan Bores M.D.   On: 04/03/2019 15:16   [place] Chest (pleural Effusion)  Result Date: 04/23/2019 CLINICAL DATA:  History of metastatic lung cancer with complex left-sided pleural effusion. Request made for chest ultrasound and guided thoracentesis as indicated. Note, patient underwent attempted ultrasound-guided thoracentesis on 04/17/2019 yielding only 100 cc of pleural fluid secondary to extensive loculations. EXAM: CHEST ULTRASOUND COMPARISON:  Ultrasound-guided left-sided thoracentesis-04/17/2019; chest CT-04/17/2019; 03/24/2019 FINDINGS: Sonographic evaluation of the right chest is negative for any significant pleural fluid (image 3). Sonographic evaluation of the left chest demonstrates multiple dense loculations within small left-sided pleural effusion. The number and conspicuity of the loculations has increased compared to the 04/17/2019 thoracentesis. As such, patient not a candidate for ultrasound-guided thoracentesis. No thoracentesis attempted. IMPRESSION: Worsening loculations within small left-sided pleural effusion. Number and conspicuity of loculation/increased compared to the 04/17/2019 thoracentesis and as such the patient is not a candidate for ultrasound-guided thoracentesis. No thoracentesis attempted. Electronically Signed   By: Sandi Mariscal M.D.   On: 04/23/2019 08:45   Ct Abdomen Pelvis W Contrast  Result Date: 04/16/2019 CLINICAL DATA:  Acute abdominal pain. Nausea and vomiting. Neutropenia. EXAM: CT ABDOMEN AND PELVIS WITH CONTRAST TECHNIQUE: Multidetector CT imaging of the abdomen and pelvis was performed using the standard protocol following bolus administration of intravenous contrast. CONTRAST:  161mL OMNIPAQUE IOHEXOL 300 MG/ML  SOLN COMPARISON:  Most recent imaging CT 03/24/2019, PET CT 12/12/2018 FINDINGS: Lower chest: Left pleural  effusion with enhancement, similar to slightly decreased from prior exam. Unchanged compressive atelectasis  and ground-glass opacities throughout the left lung base. New linear consolidation with air bronchograms in the medial right lower lobe. Hepatobiliary: Hepatic steatosis. No focal lesion. Gallbladder physiologically distended, no calcified stone. No biliary dilatation. Pancreas: 17 mm low-density lesion in the pancreatic neck with central quest occasion, unchanged parenchymal atrophy. No ductal dilatation or inflammation. Spleen: Multiple wedge-shaped low-density defects primarily in the mid lower spleen consistent with infarcts. No perisplenic fluid. Adrenals/Urinary Tract: No adrenal nodule. No hydronephrosis or perinephric edema. Small slightly wedge-shaped defect of the superior right kidney, best appreciated on sagittal image 57 series 6, not present on prior exam. Symmetric excretion on delayed phase imaging. Urinary bladder is physiologically distended without wall thickening. Stomach/Bowel: Small hiatal hernia. Stomach physiologically distended. No bowel wall thickening, inflammatory change, or obstruction. Mild colonic diverticulosis without diverticulitis. Normal appendix. Vascular/Lymphatic: Calcified aortic atherosclerosis. No aneurysm. Patent portal vein and mesenteric vessels. No enlarged lymph nodes in the abdomen or pelvis. Reproductive: Uterus and bilateral adnexa are unremarkable. Other: No ascites. No free air or intra-abdominal fluid collection. Musculoskeletal: Known multifocal sclerotic metastatic disease which appears similar to prior exam. L4 and T12 compression fractures are unchanged from prior. Mild central depression of superior L3 may represent interval mild pathologic compression fracture. IMPRESSION: 1. Multiple splenic infarcts, new from prior exam. 2. Small wedge-shaped defect in the superior right kidney, new from prior exam. Given splenic infarcts, small infarct is favored over pyelonephritis. 3. Unchanged cystic lesion in the pancreatic neck with internal calcification. 4. Mild  central depression of superior L3 is new and may reflect interval mild pathologic compression fracture. Unchanged multifocal sclerotic osseous metastatic disease. Mild L4 and T12 compression fractures are unchanged. 5. Linear consolidation in the medial right lower lobe, not seen previously, this may represent postradiation change versus pneumonia. 6. Chronic partially loculated left pleural effusion. 7. Colonic diverticulosis without diverticulitis. Aortic Atherosclerosis (ICD10-I70.0). Electronically Signed   By: Keith Rake M.D.   On: 04/16/2019 23:52   Ct Abdomen Pelvis W Contrast  Result Date: 03/24/2019 CLINICAL DATA:  Stage IV left upper lobe non-small cell lung cancer status post targeted chemotherapy, palliative radiotherapy to the brain, lung mass and bone metastases and left PleurX catheter placement. Restaging. EXAM: CT CHEST, ABDOMEN, AND PELVIS WITH CONTRAST TECHNIQUE: Multidetector CT imaging of the chest, abdomen and pelvis was performed following the standard protocol during bolus administration of intravenous contrast. CONTRAST:  33mL OMNIPAQUE IOHEXOL 300 MG/ML  SOLN COMPARISON:  01/28/2019 chest CT angiogram. 12/12/2018 PET-CT. 11/11/2018 chest CT. FINDINGS: CT CHEST FINDINGS Cardiovascular: Normal heart size. No significant pericardial effusion/thickening. Atherosclerotic nonaneurysmal thoracic aorta. Normal caliber pulmonary arteries. No central pulmonary emboli. Mediastinum/Nodes: No discrete thyroid nodules. Unremarkable esophagus. No axillary adenopathy. Multiple mildly enlarged left supraclavicular nodes, largest 1.2 cm (series 2/image 5), previously 1.0 cm on 01/27/2019 chest CT, minimally increased. No pathologically enlarged mediastinal or hilar nodes. Stable calcified left suprahilar 0.7 cm short axis node. Lungs/Pleura: No pneumothorax. Interval removal of left PleurX catheter. Small loculated dependent left pleural effusion with mildly irregular left pleural thickening, not  definitely changed. No right pleural effusion. Improved aeration in the left upper lobe. Irregular nodular 2.3 x 1.8 cm focus of consolidation in the left upper lobe (series 5/image 42), previously obscured by atelectasis and consolidation on 01/27/2019 chest CT. Sharply marginated extensive left perihilar consolidation with associated extensive patchy ground-glass opacity throughout the left lung, largely new in the interval. Mild compressive atelectasis  in the dependent left lower lobe. No acute consolidative airspace disease, lung masses or significant pulmonary nodules in the right lung. Musculoskeletal: There are widespread sclerotic osseous lesions throughout thoracic spine, bilateral ribs and bilateral scapula, which have significantly increased in sclerosis since 01/27/2019, for example in the anterior T8 vertebral body and in the upper left scapula (series 6/image 100). No definite new osseous lesions in the chest. Mild superior T12 vertebral compression fracture is new since 01/27/2019 chest CT. Moderate thoracic spondylosis. CT ABDOMEN PELVIS FINDINGS Hepatobiliary: Normal liver with no liver mass. Normal gallbladder with no radiopaque cholelithiasis. No biliary ductal dilatation. Pancreas: Low-attenuation 1.7 cm pancreatic neck lesion with coarse internal calcification (series 2/image 60), not appreciably changed since 11/11/2018 CT. No new pancreatic lesions. No pancreatic duct dilation. Spleen: Normal size. No mass. Adrenals/Urinary Tract: Normal adrenals. Normal kidneys with no hydronephrosis and no renal mass. Normal collapsed bladder. Stomach/Bowel: Small hiatal hernia. Otherwise normal nondistended stomach. Normal caliber small bowel with no small bowel wall thickening. Normal appendix. Oral contrast transits to the colon. Mild left colonic diverticulosis, with no large bowel wall thickening or significant pericolonic fat stranding. Vascular/Lymphatic: Atherosclerotic nonaneurysmal abdominal aorta.  Patent portal, splenic, hepatic and renal veins. No pathologically enlarged lymph nodes in the abdomen or pelvis. Reproductive: Grossly normal uterus.  No adnexal mass. Other: No pneumoperitoneum, ascites or focal fluid collection. Musculoskeletal: There are widespread sclerotic osseous lesions throughout the lumbar spine, sacrum, bilateral iliac bones and proximal femora bilaterally, which are significantly increased in sclerosis compared to the CT images from 12/12/2018 PET-CT study, correlating with the areas of skeletal hypermetabolism on that PET-CT study. No definite new focal osseous lesions. Mild superior L4 vertebral compression fracture is probably stable and chronic compared to the coronal reformats on 12/12/2018 PET-CT. Moderate lumbar spondylosis. IMPRESSION: 1. Widespread sclerotic osseous metastases throughout axial and proximal appendicular skeleton, significantly increased in sclerosis in the interval, a nonspecific change that probably reflects treatment effect. No definite new osseous lesions. 2. Mild left supraclavicular lymphadenopathy is slightly increased since 01/27/2019 chest CT angiogram study. 3. Aeration in the left upper lobe is improved in the interval. Irregular nodular focus of consolidation in the left upper lobe was previously obscured by consolidation/atelectasis, limiting comparison. New extensive sharply marginated perihilar consolidation and ground-glass opacity in the left lung. Findings are nonspecific but favored to represent evolving postradiation change. Close chest CT follow-up recommended. 4. Stable loculated small left pleural effusion with left pleural thickening. 5. Low-attenuation pancreatic neck lesion with internal coarse calcification is stable since 11/11/2018 CT, probably nonaggressive cystic pancreatic lesion. 6.  Aortic Atherosclerosis (ICD10-I70.0). Electronically Signed   By: Ilona Sorrel M.D.   On: 03/24/2019 17:48   Dg Chest Port 1 View  Result Date:  04/21/2019 CLINICAL DATA:  58 year old with left lung cancer, pneumonia and cough. EXAM: PORTABLE CHEST 1 VIEW COMPARISON:  Chest CT 04/17/2019 and chest radiograph 04/18/2019 FINDINGS: Persistent volume loss in the left hemithorax. There continues to be a large amount of airspace disease and consolidation in the left lung. Left lung disease may have mildly progressed in the apical region. Right lung remains clear. Heart and mediastinum are grossly stable. Negative for pneumothorax. IMPRESSION: Persistent airspace densities and consolidation in the left lung. Minimal change but there may be slight progression in the left apical region. Electronically Signed   By: Markus Daft M.D.   On: 04/21/2019 11:58   Dg Chest Port 1 View  Result Date: 04/18/2019 CLINICAL DATA:  Pleural effusion. EXAM: PORTABLE CHEST  1 VIEW COMPARISON:  April 17, 2019 FINDINGS: Again noted is extensive consolidation throughout the left lung field. There is no pneumothorax. There is a probable small left-sided pleural effusion. The mediastinum is shifted to the left consistent with left-sided volume loss. The right lung field is essentially clear. IMPRESSION: No significant interval change. Electronically Signed   By: Constance Holster M.D.   On: 04/18/2019 22:37   Dg Chest Portable 1 View  Result Date: 04/17/2019 CLINICAL DATA:  Shortness of breath. History of lung cancer. Questionable pna on CT EXAM: PORTABLE CHEST 1 VIEW COMPARISON:  Chest CT 03/24/2019. Lung bases from abdominal CT earlier this day. FINDINGS: Increased volume loss in left hemithorax with left perihilar opacity from prior chest CT. Left pleural effusion. Grossly unchanged heart size and mediastinal contours, left heart border obscured. No pulmonary edema. The consolidation in the medial right lower lobe on CT is not seen radiographically. Right lung is otherwise clear. IMPRESSION: 1. Increased volume loss in the left hemithorax and perihilar density since prior CT. Findings  may be due to post treatment related or postobstructive change, patient with known left lung cancer. Left pleural effusion. 2. The right lung base consolidation on abdominal CT earlier today is not visualized radiographically. Electronically Signed   By: Keith Rake M.D.   On: 04/17/2019 01:36   US Thoracentesis Asp Pleural Space W/img Guide  Result Date: 04/17/2019 INDICATION: Patient with history of metastatic lung cancer, recurrent malignant left pleural effusion; request made for diagnostic and therapeutic left thoracentesis. EXAM: ULTRASOUND GUIDED DIAGNOSTIC AND THERAPEUTIC LEFT THORACENTESIS MEDICATIONS: None COMPLICATIONS: None immediate. PROCEDURE: An ultrasound guided thoracentesis was thoroughly discussed with the patient and questions answered. The benefits, risks, alternatives and complications were also discussed. The patient understands and wishes to proceed with the procedure. Written consent was obtained. Ultrasound was performed to localize and mark an adequate pocket of fluid in the left chest. The area was then prepped and draped in the normal sterile fashion. 1% Lidocaine was used for local anesthesia. Under ultrasound guidance a 6 Fr Safe-T-Centesis catheter was introduced. Thoracentesis was performed. The catheter was removed and a dressing applied. FINDINGS: A total of approximately 100 cc of amber/blood-tinged fluid was removed. Samples were sent to the laboratory as requested by the clinical team. The amount of free pleural fluid was small on today's ultrasound and multiloculated. IMPRESSION: Successful ultrasound guided diagnostic and therapeutic left thoracentesis yielding 100 cc of pleural fluid. Read by: Rowe Robert, PA-C Electronically Signed   By: Jerilynn Mages.  Shick M.D.   On: 04/17/2019 11:56    Microbiology: Recent Results (from the past 240 hour(s))  Blood culture (routine x 2)     Status: None   Collection Time: 04/16/19  9:03 PM   Specimen: BLOOD  Result Value Ref Range  Status   Specimen Description   Final    BLOOD Performed at [facility] [address], [place], [place] [address]    Special Requests   Final    BOTTLES DRAWN AEROBIC AND ANAEROBIC Blood Culture adequate volume Performed at [facility] [address], [place], [place] [address]    Culture   Final    NO GROWTH 5 DAYS Performed at [facility], [place] [address], [place], [place] [address]    Report Status 04/22/2019 FINAL  Final  SARS Coronavirus 2 (CEPHEID - Performed in [facility] hospital lab), Hosp Order     Status: None   Collection Time: 04/17/19 12:22 AM  Specimen: Urine, Clean Catch; Nasopharyngeal  Result Value Ref Range Status   SARS Coronavirus 2 NEGATIVE NEGATIVE Final    Comment: (NOTE) If result is NEGATIVE SARS-CoV-2 target nucleic acids are NOT DETECTED. The SARS-CoV-2 RNA is generally detectable in upper and lower  respiratory specimens during the acute phase of infection. The lowest  concentration of SARS-CoV-2 viral copies this assay can detect is 250  copies / mL. A negative result does not preclude SARS-CoV-2 infection  and should not be used as the sole basis for treatment or other  patient management decisions.  A negative result may occur with  improper specimen collection / handling, submission of specimen other  than nasopharyngeal swab, presence of viral mutation(s) within the  areas targeted by this assay, and inadequate number of viral copies  (<250 copies / mL). A negative result must be combined with clinical  observations, patient history, and epidemiological information. If result is POSITIVE SARS-CoV-2 target nucleic acids are DETECTED. The SARS-CoV-2 RNA is generally detectable in upper and lower  respiratory specimens dur ing the acute phase of infection.  Positive  results are indicative of active infection with SARS-CoV-2.  Clinical  correlation with patient history and other diagnostic  information is  necessary to determine patient infection status.  Positive results do  not rule out bacterial infection or co-infection with other viruses. If result is PRESUMPTIVE POSTIVE SARS-CoV-2 nucleic acids MAY BE PRESENT.   A presumptive positive result was obtained on the submitted specimen  and confirmed on repeat testing.  While 2019 novel coronavirus  (SARS-CoV-2) nucleic acids may be present in the submitted sample  additional confirmatory testing may be necessary for epidemiological  and / or clinical management purposes  to differentiate between  SARS-CoV-2 and other Sarbecovirus currently known to infect humans.  If clinically indicated additional testing with an alternate test  methodology [phone]) is advised. The SARS-CoV-2 RNA is generally  detectable in upper and lower respiratory sp ecimens during the acute  phase of infection. The expected result is Negative. Fact Sheet for Patients:  StrictlyIdeas.no Fact Sheet for Healthcare Providers: BankingDealers.co.za This test is not yet approved or cleared by the [place] FDA and has been authorized for detection and/or diagnosis of SARS-CoV-2 by FDA under an Emergency Use Authorization (EUA).  This EUA will remain in effect (meaning this test can be used) for the duration of the COVID-19 declaration under Section 564(b)(1) of the Act, 21 U.S.C. section 360bbb-3(b)(1), unless the authorization is terminated or revoked sooner. Performed at [facility], [place] [address], [place], [place] [address]   Urine culture     Status: None   Collection Time: 04/17/19 12:55 AM   Specimen: Urine, Random  Result Value Ref Range Status   Specimen Description   Final    URINE, RANDOM Performed at [facility] [address] [place], [place] [address]    Special Requests   Final    NONE Performed at [facility], [place]  [address] [place], [place] [address]    Culture   Final    NO GROWTH Performed at [facility], [place] [address], [place], [place] [address]    Report Status 04/18/2019 FINAL  Final  MRSA PCR Screening     Status: None   Collection Time: 04/17/19  4:39 AM   Specimen: Nasal Mucosa; Nasopharyngeal  Result Value Ref Range Status   MRSA by PCR NEGATIVE NEGATIVE Final    Comment:  The GeneXpert MRSA Assay (FDA approved for NASAL specimens only), is one component of a comprehensive MRSA colonization surveillance program. It is not intended to diagnose MRSA infection nor to guide or monitor treatment for MRSA infections. Performed at [facility], [place] [address], [place], [place] [address]   Blood culture (routine x 2)     Status: None   Collection Time: 04/17/19  4:48 AM   Specimen: BLOOD  Result Value Ref Range Status   Specimen Description   Final    BLOOD RIGHT ANTECUBITAL Performed at [facility] [address], [place], [place] [address]    Special Requests   Final    BOTTLES DRAWN AEROBIC ONLY Blood Culture adequate volume Performed at [facility] [address] [place], [place] [address]    Culture   Final    NO GROWTH 5 DAYS Performed at [facility], [place] [address], [place], [place] [address]    Report Status 04/22/2019 FINAL  Final  Body fluid culture     Status: None   Collection Time: 04/17/19 12:03 PM   Specimen: Pleural, Left; Pleural Fluid  Result Value Ref Range Status   Specimen Description   Final    PLEURAL Performed at [facility], [place] [address], [place], [place] [address]    Special Requests   Final    NONE Performed at [facility], [place] [address] [place], [place] [address]    Gram Stain   Final    RARE WBC PRESENT, PREDOMINANTLY MONONUCLEAR NO ORGANISMS SEEN    Culture   Final    NO GROWTH 3 DAYS Performed at [facility], [place] [address], [place], [place] [address]    Report Status 04/20/2019 FINAL  Final  Sputum culture     Status: None   Collection Time: 04/18/19  8:00 AM   Specimen: Sputum  Result Value Ref Range Status   Specimen Description SPUTUM  Final   Special Requests NONE  Final   Sputum evaluation   Final    THIS SPECIMEN IS ACCEPTABLE FOR SPUTUM CULTURE Performed at [facility], [place] [address], [place], [place] [address]    Report Status 04/18/2019 FINAL  Final  Culture, respiratory     Status: None   Collection Time: 04/18/19  8:00 AM   Specimen: SPU  Result Value Ref Range Status   Specimen Description   Final    SPUTUM Performed at [facility] [address] [place], [place] [address]    Special Requests   Final    NONE Reflexed from [id number] Performed at [facility], [place] [address] [place], [place] [address]    Gram Stain   Final    FEW WBC PRESENT,BOTH PMN AND MONONUCLEAR FEW GRAM VARIABLE ROD RARE GRAM POSITIVE COCCI IN PAIRS    Culture   Final    FEW Consistent with normal respiratory flora. Performed at [facility], [place] [address] [place], [place] [address]    Report Status 04/21/2019 FINAL  Final  SARS Coronavirus 2 (CEPHEID - Performed in [facility] hospital lab), Hosp Order     Status: None   Collection Time: 04/22/19 12:28 PM   Specimen: Nasopharyngeal Swab  Result Value Ref Range Status   SARS Coronavirus 2 NEGATIVE NEGATIVE Final    Comment: (NOTE) If result is NEGATIVE SARS-CoV-2 target nucleic acids are NOT DETECTED. The SARS-CoV-2 RNA is  generally detectable in upper and lower  respiratory specimens during the acute phase of infection. The lowest  concentration of SARS-CoV-2 viral copies this assay can detect is 250  copies / mL. A negative result does not preclude SARS-CoV-2 infection  and should not be used as the sole basis for treatment or other  patient management decisions.  A negative result  may occur with  improper specimen collection / handling, submission of specimen other  than nasopharyngeal swab, presence of viral mutation(s) within the  areas targeted by this assay, and inadequate number of viral copies  (<250 copies / mL). A negative result must be combined with clinical  observations, patient history, and epidemiological information. If result is POSITIVE SARS-CoV-2 target nucleic acids are DETECTED. The SARS-CoV-2 RNA is generally detectable in upper and lower  respiratory specimens dur ing the acute phase of infection.  Positive  results are indicative of active infection with SARS-CoV-2.  Clinical  correlation with patient history and other diagnostic information is  necessary to determine patient infection status.  Positive results do  not rule out bacterial infection or co-infection with other viruses. If result is PRESUMPTIVE POSTIVE SARS-CoV-2 nucleic acids MAY BE PRESENT.   A presumptive positive result was obtained on the submitted specimen  and confirmed on repeat testing.  While 2019 novel coronavirus  (SARS-CoV-2) nucleic acids may be present in the submitted sample  additional confirmatory testing may be necessary for epidemiological  and / or clinical management purposes  to differentiate between  SARS-CoV-2 and other Sarbecovirus currently known to infect humans.  If clinically indicated additional testing with an alternate test  methodology [phone]) is advised. The SARS-CoV-2 RNA is generally  detectable in upper and lower respiratory sp ecimens during the acute  phase of infection. The expected result is Negative. Fact Sheet for Patients:  StrictlyIdeas.no Fact Sheet for Healthcare Providers: BankingDealers.co.za This test is not yet approved or cleared by the [place] FDA and has been authorized for detection and/or diagnosis of SARS-CoV-2 by FDA under an Emergency Use Authorization (EUA).   This EUA will remain in effect (meaning this test can be used) for the duration of the COVID-19 declaration under Section 564(b)(1) of the Act, 21 U.S.C. section 360bbb-3(b)(1), unless the authorization is terminated or revoked sooner. Performed at [facility], [place] [address] [place], [place] [address]      Labs: Basic Metabolic Panel: Recent Labs  Lab 04/16/19 2103 04/17/19 0448 04/18/19 0425 04/21/19 0623  NA 142 140 137 136  K 3.8 3.8 4.4 3.8  CL 105 105 106 103  CO2 22 24 22 22   GLUCOSE 128* 134* 118* 114*  BUN 14 14 11 9   CREATININE 0.69 0.64 0.62 0.60  CALCIUM 9.5 9.2 8.8* 8.1*  MG  --  2.2  --  2.0  PHOS  --   --   --  2.6   Liver Function Tests: Recent Labs  Lab 04/16/19 2103  AST 41  ALT 16  ALKPHOS 77  BILITOT 0.8  PROT 7.1  ALBUMIN 3.7   Recent Labs  Lab 04/16/19 2103  LIPASE 31   No results for input(s): AMMONIA in the last 168 hours. CBC: Recent Labs  Lab 04/16/19 2103 04/17/19 0448 04/18/19 0425 04/21/19 0623 04/23/19 0513  WBC 10.8* 10.6* 11.8* 8.2 8.2  NEUTROABS 9.5*  --   --  6.7 6.3  HGB 13.1 12.6 11.6* 10.0* 10.8*  HCT 39.0 37.7 35.4* 31.1* 33.5*  MCV 88.4 89.1 91.7  90.1 90.8  PLT 171 155 122* 138* 177   Cardiac Enzymes: No results for input(s): CKTOTAL, CKMB, CKMBINDEX, TROPONINI in the last 168 hours. BNP: BNP (last 3 results) Recent Labs    01/27/19 2043  BNP 29.5    ProBNP (last 3 results) No results for input(s): PROBNP in the last 8760 hours.  CBG: Recent Labs  Lab 04/17/19 0507  GLUCAP 119*       Signed:  Kayleen Memos, MD [facility] 04/23/2019, 11:53 AM

## 2019-04-23 NOTE — Discharge Instructions (Signed)
Cough, Adult A cough helps to clear your throat and lungs. A cough may be a sign of an illness or another medical condition. An acute cough may only last 2-3 weeks, while a chronic cough may last 8 or more weeks. Many things can cause a cough. They include:  Germs (viruses or bacteria) that attack the airway.  Breathing in things that bother (irritate) your lungs.  Allergies.  Asthma.  Mucus that runs down the back of your throat (postnasal drip).  Smoking.  Acid backing up from the stomach into the tube that moves food from the mouth to the stomach (gastroesophageal reflux).  Some medicines.  Lung problems.  Other medical conditions, such as heart failure or a blood clot in the lung (pulmonary embolism). Follow these instructions at home: Medicines  Take over-the-counter and prescription medicines only as told by your doctor.  Talk with your doctor before you take medicines that stop a cough (coughsuppressants). Lifestyle   Do not smoke, and try not to be around smoke. Do not use any products that contain nicotine or tobacco, such as cigarettes, e-cigarettes, and chewing tobacco. If you need help quitting, ask your doctor.  Drink enough fluid to keep your pee (urine) pale yellow.  Avoid caffeine.  Do not drink alcohol if your doctor tells you not to drink. General instructions   Watch for any changes in your cough. Tell your doctor about them.  Always cover your mouth when you cough.  Stay away from things that make you cough, such as perfume, candles, campfire smoke, or cleaning products.  If the air is dry, use a cool mist vaporizer or humidifier in your home.  If your cough is worse at night, try using extra pillows to raise your head up higher while you sleep.  Rest as needed.  Keep all follow-up visits as told by your doctor. This is important. Contact a doctor if:  You have new symptoms.  You cough up pus.  Your cough does not get better after 2-3  weeks, or your cough gets worse.  Cough medicine does not help your cough and you are not sleeping well.  You have pain that gets worse or pain that is not helped with medicine.  You have a fever.  You are losing weight and you do not know why.  You have night sweats. Get help right away if:  You cough up blood.  You have trouble breathing.  Your heartbeat is very fast. These symptoms may be an emergency. Do not wait to see if the symptoms will go away. Get medical help right away. Call your local emergency services (911 in the [place] Do not drive yourself to the hospital. Summary  A cough helps to clear your throat and lungs. Many things can cause a cough.  Take over-the-counter and prescription medicines only as told by your doctor.  Always cover your mouth when you cough.  Contact a doctor if you have new symptoms or you have a cough that does not get better or gets worse. This information is not intended to replace advice given to you by your health care provider. Make sure you discuss any questions you have with your health care provider. Document Released: 06/11/2011 Document Revised: 10/17/2018 Document Reviewed: 10/17/2018 Elsevier Patient Education  [employer].   Pleural Effusion Pleural effusion is an abnormal buildup of fluid in the layers of tissue between the lungs and the inside of the chest (pleural space) The two layers of tissue that  line the lungs and the inside of the chest are called pleura. Usually, there is no air in the space between the pleura, only a thin layer of fluid. Some conditions can cause a large amount of fluid to build up, which can cause the lung to collapse if untreated. A pleural effusion is usually caused by another disease that requires treatment. What are the causes? Pleural effusion can be caused by:  Heart failure.  Certain infections, such as pneumonia or tuberculosis.  Cancer.  A blood clot in the lung (pulmonary  embolism).  Complications from surgery, such as from open heart surgery.  Liver disease (cirrhosis).  Kidney disease. What are the signs or symptoms? In some cases, pleural effusion may cause no symptoms. If symptoms are present, they may include:  Shortness of breath, especially when lying down.  Chest pain. This may get worse when taking a deep breath.  Fever.  Dry, long-lasting (chronic) cough.  Hiccups.  Rapid breathing. An underlying condition that is causing the pleural effusion (such as heart failure, pneumonia, blood clots, tuberculosis, or cancer) may also cause other symptoms. How is this diagnosed? This condition may be diagnosed based on:  Your symptoms and medical history.  A physical exam.  A chest X-ray.  A procedure to use a needle to remove fluid from the pleural space (thoracentesis). This fluid is tested.  Other imaging studies of the chest, such as ultrasound or CT scan. How is this treated? Depending on the cause of your condition, treatment may include:  Treating the underlying condition that is causing the effusion. When that condition improves, the effusion will also improve. Examples of treatment for underlying conditions include: ? Antibiotic medicines to treat an infection. ? Diuretics or other heart medicines to treat heart failure.  Thoracentesis.  Placing a thin flexible tube under your skin and into your chest to continuously drain the effusion (indwelling pleural catheter).  Surgery to remove the outer layer of tissue from the pleural space (decortication).  A procedure to put medicine into the chest cavity to seal the pleural space and prevent fluid buildup (pleurodesis).  Chemotherapy and radiation therapy, if you have cancerous (malignant) pleural effusion. These treatments are typically used to treat cancer. They kill certain cells in the body. Follow these instructions at home:  Take over-the-counter and prescription medicines  only as told by your health care provider.  Ask your health care provider what activities are safe for you.  Keep track of how long you are able to do mild exercise (such as walking) before you get short of breath. Write down this information to share with your health care provider. Your ability to exercise should improve over time.  Do not use any products that contain nicotine or tobacco, such as cigarettes and e-cigarettes. If you need help quitting, ask your health care provider.  Keep all follow-up visits as told by your health care provider. This is important. Contact a health care provider if:  The amount of time that you are able to do mild exercise: ? Decreases. ? Does not improve with time.  You have a fever. Get help right away if:  You are short of breath.  You develop chest pain.  You develop a new cough. Summary  Pleural effusion is an abnormal buildup of fluid in the layers of tissue between the lungs and the inside of the chest.  Pleural effusion can have many causes, including heart failure, pulmonary embolism, infections, or cancer.  Symptoms of pleural effusion  can include shortness of breath, chest pain, fever, long-lasting (chronic) cough, hiccups, or rapid breathing.  Diagnosis often involves making images of the chest (such as with ultrasound or X-ray) and removing fluid (thoracentesis) to send for testing.  Treatment for pleural effusion depends on what underlying condition is causing it. This information is not intended to replace advice given to you by your health care provider. Make sure you discuss any questions you have with your health care provider. Document Released: 09/28/2005 Document Revised: 09/10/2017 Document Reviewed: 06/03/2017 Elsevier Patient Education  2020 [employer].

## 2019-04-23 NOTE — Progress Notes (Signed)
SATURATION QUALIFICATIONS: (This note is used to comply with regulatory documentation for home oxygen)  Patient Saturations on Room Air at Rest = 95%  Patient Saturations on Room Air while Ambulating = 80%  Patient Saturations on 2 Liters of oxygen while Ambulating = 90%  Please briefly explain why patient needs home oxygen: Pt oxygen drops to 80% on room air while ambulating.

## 2019-04-23 NOTE — TOC Initial Note (Signed)
Transition of Care [facility]) - Initial/Assessment Note    Patient Details  Name: Tina Cruz MRN: [id number] Date of Birth: 11/22/1960  Transition of Care (TOC) CM/SW Contact:    Joaquin Courts, RN Phone Number: 04/23/2019, 10:51 AM  Clinical Narrative:     CM spoke with spouse over the phone who expresses that he does not want home health set up at this time due to concerns over Covid and having people come into the home.  Additionally spouse declined rolling walker and 3in1, states wants to bring his wife home first and see if they really need the equipment. Spouse states he can pick equipment at the medical supply store close to their home if they need it. [employer] to deliver oxygen to the bedside.          Expected Discharge Plan: Home/Self Care Barriers to Discharge: No Barriers Identified   Patient Goals and CMS Choice Patient states their goals for this hospitalization and ongoing recovery are:: To go home      Expected Discharge Plan and Services Expected Discharge Plan: Home/Self Care   Discharge Planning Services: CM Consult   Living arrangements for the past 2 months: Single Family Home Expected Discharge Date: 04/23/19               DME Arranged: Oxygen DME Agency: [employer] Date DME Agency Contacted: 04/23/19 Time DME Agency Contacted: 32 Representative spoke with at DME Agency: [place] Arranged: NA [place] Agency: NA        Prior Living Arrangements/Services Living arrangements for the past 2 months: [place] with:: Spouse Patient language and need for interpreter reviewed:: Yes Do you feel safe going back to the place where you live?: Yes      Need for Family Participation in Patient Care: Yes (Comment) Care giver support system in place?: Yes (comment)   Criminal Activity/Legal Involvement Pertinent to Current Situation/Hospitalization: No - Comment as needed  Activities of Daily Living Home Assistive Devices/Equipment:  None ADL Screening (condition at time of admission) Patient's cognitive ability adequate to safely complete daily activities?: No Is the patient deaf or have difficulty hearing?: No Does the patient have difficulty seeing, even when wearing glasses/contacts?: No Does the patient have difficulty concentrating, remembering, or making decisions?: No Patient able to express need for assistance with ADLs?: Yes Does the patient have difficulty dressing or bathing?: No Independently performs ADLs?: Yes (appropriate for developmental age) Does the patient have difficulty walking or climbing stairs?: Yes Weakness of Legs: Both Weakness of Arms/Hands: Both  Permission Sought/Granted                  Emotional Assessment Appearance:: Appears stated age Attitude/Demeanor/Rapport: Engaged Affect (typically observed): Accepting     Psych Involvement: No (comment)  Admission diagnosis:  Generalized abdominal pain [R10.84] QT prolongation [R94.31] Sepsis without acute organ dysfunction, due to unspecified organism [facility]) [A41.9] Patient Active Problem List   Diagnosis Date Noted  . Pneumonia 04/17/2019  . Splenic infarct 04/17/2019  . Lumbar compression fracture ([place]) 04/17/2019  . Renal infarct ([place]) 04/17/2019  . Abdominal pain 04/17/2019  . Nausea & vomiting 04/17/2019  . Protein-calorie malnutrition ([place]) 02/13/2019  . Hives   . Itching   . Acute respiratory failure with hypoxia ([place]) 01/27/2019  . Encounter for antineoplastic chemotherapy 01/09/2019  . Pulmonary embolism ([place]) 01/06/2019  . Adenocarcinoma of left lung, stage 4 ([place]) 12/14/2018  . Recurrent left pleural effusion 12/14/2018  . Bone metastases ([place]) 12/14/2018  .  Brain metastasis ([place]) 12/14/2018  . Goals of care, counseling/discussion 12/14/2018  . Cancer associated pain 12/14/2018  . Allergic rhinitis 07/27/2018  . Hemangioma 03/11/2017  . Seborrheic keratosis 03/11/2017  . Skin tag 03/11/2017  . GERD  (gastroesophageal reflux disease) 01/14/2015  . Thyroid disease    PCP:  [name], MD Pharmacy:   [facility], [place] [address] Phone: [phone] Fax: [phone]  [facility] [place], [address] [place] [address] Phone: [phone] Fax: [phone]  [id number] [place], [place] - [address] [place] [address] Phone: [phone] Fax: [phone]     Social Determinants of Health (SDOH) Interventions    Readmission Risk Interventions Readmission Risk Prevention Plan 04/20/2019  Transportation Screening Complete  PCP or Specialist Appt within 3-5 Days Not Complete  Not Complete comments Pt established with providers however DC date unknown  [place] or [place] Complete  Social Work Consult for [place] Planning/Counseling Complete  Palliative Care Screening Not Complete  Palliative Care Screening Not Complete Comments has not been indicated  Medication Review Press photographer) Complete  Some recent data might be hidden

## 2019-04-24 ENCOUNTER — Telehealth: Payer: Self-pay | Admitting: *Deleted

## 2019-04-24 NOTE — Telephone Encounter (Signed)
Pt recently d/c from hosp, needs F/U with MD.  Message to scheduling.

## 2019-04-25 ENCOUNTER — Telehealth: Payer: Self-pay | Admitting: Internal Medicine

## 2019-04-25 NOTE — Telephone Encounter (Signed)
Scheduled appt per 7/13 sch message - pt is aware of appt date and time

## 2019-04-25 NOTE — Progress Notes (Signed)
Received call from Christella Scheuermann [phone] informing that pt and husband have now opted to receive home health and [employer] case manager is working on identifying provider. (Pt/husband had declined [facility] upon DC 04/20/2019 however orders had been placed during stay)  Sharren Bridge, MSW, LCSW Transitions of Care 04/25/2019 [phone]

## 2019-04-26 ENCOUNTER — Other Ambulatory Visit: Payer: Self-pay | Admitting: Family Medicine

## 2019-04-26 ENCOUNTER — Telehealth: Payer: Self-pay | Admitting: Family Medicine

## 2019-04-26 DIAGNOSIS — Z515 Encounter for palliative care: Secondary | ICD-10-CM

## 2019-04-26 MED ORDER — OXYCODONE HCL 5 MG PO TABS: 5.0000 mg | ORAL_TABLET | Freq: Two times a day (BID) | ORAL | 0 refills | Status: DC | PRN

## 2019-04-26 NOTE — Telephone Encounter (Signed)
Medication refilled [facility] is over the counter 1 tab twice a day

## 2019-04-26 NOTE — Telephone Encounter (Signed)
Call placed to patient and patient made aware.  

## 2019-04-26 NOTE — Telephone Encounter (Signed)
MD please advise

## 2019-04-26 NOTE — Telephone Encounter (Signed)
We could not see this patient until Monday for her hospital f/u, however does not have enough pain meds to last her until then could some be called in for her  Oxycodone would need stool softner too if possible (senna docusate) [facility]

## 2019-05-01 ENCOUNTER — Encounter: Payer: Self-pay | Admitting: Family Medicine

## 2019-05-01 ENCOUNTER — Ambulatory Visit (INDEPENDENT_AMBULATORY_CARE_PROVIDER_SITE_OTHER): Payer: Managed Care, Other (non HMO) | Admitting: Family Medicine

## 2019-05-01 ENCOUNTER — Other Ambulatory Visit: Payer: Self-pay

## 2019-05-01 VITALS — BP 118/62 | HR 88 | Temp 98.2°F | Resp 16 | Ht 67.0 in | Wt 136.0 lb

## 2019-05-01 DIAGNOSIS — E43 Unspecified severe protein-calorie malnutrition: Secondary | ICD-10-CM | POA: Diagnosis not present

## 2019-05-01 DIAGNOSIS — C7951 Secondary malignant neoplasm of bone: Secondary | ICD-10-CM | POA: Diagnosis not present

## 2019-05-01 DIAGNOSIS — S32030A Wedge compression fracture of third lumbar vertebra, initial encounter for closed fracture: Secondary | ICD-10-CM | POA: Diagnosis not present

## 2019-05-01 DIAGNOSIS — C7931 Secondary malignant neoplasm of brain: Secondary | ICD-10-CM

## 2019-05-01 DIAGNOSIS — C3492 Malignant neoplasm of unspecified part of left bronchus or lung: Secondary | ICD-10-CM

## 2019-05-01 DIAGNOSIS — J90 Pleural effusion, not elsewhere classified: Secondary | ICD-10-CM

## 2019-05-01 MED ORDER — ONDANSETRON 8 MG PO TBDP: 8.0000 mg | ORAL_TABLET | Freq: Three times a day (TID) | ORAL | 2 refills | Status: AC | PRN

## 2019-05-01 MED ORDER — FENTANYL 25 MCG/HR TD PT72: 1.0000 | MEDICATED_PATCH | TRANSDERMAL | 0 refills | Status: DC

## 2019-05-01 MED ORDER — LUBIPROSTONE 24 MCG PO CAPS: 24.0000 ug | ORAL_CAPSULE | Freq: Two times a day (BID) | ORAL | 2 refills | Status: DC

## 2019-05-01 MED ORDER — PANTOPRAZOLE SODIUM 40 MG PO TBEC: 40.0000 mg | DELAYED_RELEASE_TABLET | Freq: Every day | ORAL | 3 refills | Status: AC

## 2019-05-01 MED ORDER — LEVOTHYROXINE SODIUM 112 MCG PO TABS: 112.0000 ug | ORAL_TABLET | Freq: Every day | ORAL | 1 refills | Status: AC

## 2019-05-01 NOTE — Patient Instructions (Addendum)
Take Zofran for nausea Start Fentayl patch Until you get it, you can take 2 of the oxycodone ( 10mg ) every 6 hours as needed  Cancel follow up for August  I will send a message to your oncologist

## 2019-05-01 NOTE — Assessment & Plan Note (Addendum)
He has metastatic adenocarcinoma.  With bone mets brain mets as well as multiple organ mets.  She has significant pain from her cancer.  Since she has difficulty with nausea and vomiting poor appetite.  I am going to change her to a fentanyl patch 51mcg after discussion with her and her husband.  Until she receives the patch she can take 10 mg of the oxycodone every 6 hours. they are concentrating on pain control and quality of life at this time.  I will also give her Zofran 8 mg ODT to use for the nausea.  She has follow-up with her oncologist next week they are not sure the next steps.  She has recurrent malignant pleural effusion but does not want another Pleurx catheter she would prefer they just draw off the fluid as needed for her symptoms.  Nothing unfortunately can be down with a compression fracture in the setting of her other metastatic lesions so it is more pain control.  She will continue with the oxygen therapy which she benefits.  She is getting physical therapy occupational therapy.  Their home is assessable for her and husband has access to a Rollator which can also help with her ambulating to her doctor's appointments.  I think that her husband needs to be in on her visits that she is not making most of these decisions by herself and he is her caregiver.  I will reach out to oncology to see if her husband can be present for their visits going forward.

## 2019-05-01 NOTE — Progress Notes (Signed)
Subjective:    Patient ID: Tina Cruz, female    DOB: [birth year], 58 y.o.   MRN: [id number]  Patient presents for Hospital F/U Patient here for hospital follow-up.  She was admitted to the hospital secondary to pneumonia.  This is been her known malignant left pleural effusion and she had to have left thoracentesis performed.  She has underlying small cell lung cancer as well as metastatic lesions to the bone and adrenal gland.  She had intractable nausea vomiting abdominal pain when she presented to the ER. All of them portions of her discharge summary were copied to this note secondary to the complexity of her medical illnesses.  History of present illness:  58 year old female with PMH of stage IV non-small cell lung cancer, adenocarcinoma with metastasis to bone, brain, right adrenal gland, malignant left pleural effusion, GERD, hypothyroid, presented with intractable nausea, nonbloody emesis that began on the afternoon of admission, associated with periumbilical abdominal pain which she thinks may be related to her nausea and vomiting, chronic cough and dyspnea but now with productive cough and dyspnea on minimal exertion, inability to keep anything down. She was admitted for intractable nausea, vomiting, abdominal pain, splenic and right renal infarcts, suspected community-acquired pneumonia versus aspiration pneumonia and recurrent left malignant pleural effusion. IR and oncology were consulted. S/p 100 mL ultrasound-guided left thoracentesis 04/17/19.  Hospital Course:  Principal Problem:   Pneumonia  Resolved intractable nausea, vomiting and abdominal pain No sign of obstruction on CT abdomen and pelvis Able to tolerate a soft diet without difficulty. Encouraged to increase oral protein calorie intake.  Resolving acute hypoxic respiratory failure likely multifactorial secondary tomalignant left pleural effusion fromstage IV lung cancer versus radiation changes versus  community-acquired pneumonia O2 saturation 93% on room air on the monitor in the room Not on oxygen supplementation at baseline. Independently reviewed chest x-ray done on 04/17/2017 which showed extensive consolidation throughout the left lung field. Right lung field is essentially clear. Repeat chest x-ray on 04/21/2019 due to persistent coughindependently reviewed showed worsening left sided pleural effusion. Due to persistent cough, IRconsulted for possible left thoracentesis in order to improve symptomatology.  Not amenable due to loculated effusion. Home O2 evaluation prior to discharge.  Improving intractable cough likely secondary toleft pleural effusion versuslung cancer Continue to treat symptomatically Continue Tussionex and Tessalon perles as needed  Resolved community-acquired pneumonia versus post radiation changes Completed 5 days of Rocephin and IV azithromycin Procalcitonin 0.14 on 04/19/2019 Vital signs and labs reviewed and are stable Afebrile with no leukocytosis  Improving acute dehydration secondary to poor oral intake Stopped IV fluid on 04/21/2019  Patient advised to increase oral intake  Splenic and right renal infarct in the setting of stage IV lung cancer on Eliquis Previously on Eliquis with compliance. Oncology recommends subcu Lovenox full dose Continuesubcu Lovenox 100 mg daily started on 04/18/2019 No valvular lesions on TTE  Recurrent malignant L pleural affusion Postpreviouslyleft thoracentesis on 7/6/2020with 100 cc of bloody pleural fluid removed CytologyRARE MALIGNANT CELLS CONSISTENT WITH ADENOCARCINOMA IR consulted for possible repeat thoracentesis on 04/22/2019.  Not amenable due to loculated effusion.  L3 compression fracture Likely pathologic compression fracture in the setting of stage IV lung cancer with bone mets  Stage IV non-small cell lung cancer, adenocarcinoma with brain mets  On [place]  Follow-up with oncology and  radiation oncology.  Per oncology her treated brain mets responded well to the radiation treatment and there are no new lesions. The plan is to repeat a  brain MRI in 3 months and follow-up with neurosurgeon.  History of pulmonary embolism Continuefull dosesubcu Lovenox daily  Prolonged QTC  QTC improved from 497 on admission EKG to 475 on 7/6.  Minimize QT prolonging medications. Monitor EKG periodically.  Repeat twelve-lead EKG on 04/23/2019  Physical debility/ambulatory dysfunction--family initially declined this will want to get home they needed help therefore orders were written by my office for physical therapy occupational therapy and the commode PT OT assessed and recommended home health PT OT Continue physical therapy DME 3 in 1 bedside commode  Fall precautions  GERD Continue Protonix p.o. daily   She is here with her husband who is been her main caregiver.  She requires assistance with bathing preparing meals she is unable to drive he helps with her medications. Her pain is uncontrollable.  She has it in her back where she has a compression fracture she also has it in her left upper quadrant she has multiple splenic mets she is fatigued.  She has a very poor appetite.  She is on 2 L of oxygen to help with her breathing and stamina.  She was given oxycodone which I refilled on the 15th but states that this medication wears off very quickly.  She has severe nausea and episodes of vomiting.  She does not have any antinausea medication at home.  She is also very constipated with her pain medications.  They did try stool softeners and magnesia but this has not helped.   Review Of Systems:  GEN- denies fatigue, fever, weight loss,weakness, recent illness HEENT- denies eye drainage, change in vision, nasal discharge, CVS- denies chest pain, palpitations RESP- denies SOB, cough, wheeze ABD- + N/V, +change in stools, abd pain GU- denies dysuria, hematuria, dribbling,  incontinence MSK- denies joint pain, muscle aches, injury Neuro- denies headache, dizziness, syncope, seizure activity       Objective:    BP 118/62   Pulse 88   Temp 98.2 F (36.8 C) (Oral)   Resp 16   Ht 5\' 7"  (1.702 m)   Wt 136 lb (61.7 kg)   LMP  (LMP Unknown)   SpO2 95% Comment: 2L/min via [place]  BMI 21.30 kg/m  GEN- NAD, alert and oriented x3, chronically ill appearing, sitting in chair in pain  HEENT- PERRL, EOMI, non injected sclera, pink conjunctiva, MMM, oropharynx clear Neck- Supple, no thyromegaly CVS- RRR, no murmur RESP-CTAB 2L oxygen, decreased at bases  ABD-NABS,soft,TTP Upper quadrants, LUQ > RUQ, no guarding EXT- ankle  Edema bilat  Pulses- Radial  2+        Assessment & Plan:      Problem List Items Addressed This Visit      Unprioritized   Adenocarcinoma of left lung, stage 4 (HCC) (Chronic)   Relevant Medications   ondansetron (ZOFRAN ODT) 8 MG disintegrating tablet   Bone metastases (HCC)   Relevant Medications   ondansetron (ZOFRAN ODT) 8 MG disintegrating tablet   Brain metastasis (HCC)   Relevant Medications   ondansetron (ZOFRAN ODT) 8 MG disintegrating tablet   Lumbar compression fracture (HCC) - Primary   Protein-calorie malnutrition (HCC)   Recurrent left pleural effusion (Chronic)    He has metastatic adenocarcinoma.  With bone mets brain mets as well as multiple organ mets.  She has significant pain from her cancer.  Since she has difficulty with nausea and vomiting poor appetite.  I am going to change her to a fentanyl patch 51mcg after discussion with her and her husband.  Until she receives the patch she can take 10 mg of the oxycodone every 6 hours. they are concentrating on pain control and quality of life at this time.  I will also give her Zofran 8 mg ODT to use for the nausea.  She has follow-up with her oncologist next week they are not sure the next steps.  She has recurrent malignant pleural effusion but does not want another  Pleurx catheter she would prefer they just draw off the fluid as needed for her symptoms.  Nothing unfortunately can be down with a compression fracture in the setting of her other metastatic lesions so it is more pain control.  She will continue with the oxygen therapy which she benefits.  She is getting physical therapy occupational therapy.  Their home is assessable for her and husband has access to a Rollator which can also help with her ambulating to her doctor's appointments.  I think that her husband needs to be in on her visits that she is not making most of these decisions by herself and he is her caregiver.  I will reach out to oncology to see if her husband can be present for their visits going forward.         Note: This dictation was prepared with Dragon dictation along with smaller phrase technology. Any transcriptional errors that result from this process are unintentional.

## 2019-05-02 ENCOUNTER — Telehealth: Payer: Self-pay | Admitting: *Deleted

## 2019-05-02 NOTE — Telephone Encounter (Signed)
Received PA determination.   PA Case: [id number] approved 05/02/2019- 05/01/2020.  Pharmacy made aware.

## 2019-05-02 NOTE — Telephone Encounter (Signed)
Received request from pharmacy for PA on Fentanyl Patches.  PA submitted.   Dx: Compression Fx, L3 (S32.030A), Bone Mets (C79.51)

## 2019-05-02 NOTE — Telephone Encounter (Signed)
Your information has been submitted and will be reviewed by [place]. You may close this dialog, return to your dashboard, and perform other tasks. An electronic determination will be received in CoverMyMeds within 72-120 hours. You can see the latest determination by locating this request on your dashboard or by reopening this request. You will receive a fax copy of the determination. If Christella Scheuermann has not responded in 120 hours, contact [employer] at [phone].

## 2019-05-04 ENCOUNTER — Other Ambulatory Visit: Payer: Self-pay | Admitting: *Deleted

## 2019-05-04 MED ORDER — PROMETHAZINE HCL 25 MG RE SUPP: 25.0000 mg | Freq: Four times a day (QID) | RECTAL | 0 refills | Status: DC | PRN

## 2019-05-08 ENCOUNTER — Inpatient Hospital Stay (HOSPITAL_BASED_OUTPATIENT_CLINIC_OR_DEPARTMENT_OTHER): Payer: Managed Care, Other (non HMO) | Admitting: Internal Medicine

## 2019-05-08 ENCOUNTER — Other Ambulatory Visit: Payer: Self-pay

## 2019-05-08 ENCOUNTER — Telehealth: Payer: Self-pay | Admitting: Internal Medicine

## 2019-05-08 ENCOUNTER — Inpatient Hospital Stay: Payer: Managed Care, Other (non HMO)

## 2019-05-08 ENCOUNTER — Encounter: Payer: Self-pay | Admitting: Internal Medicine

## 2019-05-08 VITALS — BP 107/65 | HR 106 | Temp 98.2°F | Ht 67.0 in | Wt 145.4 lb

## 2019-05-08 DIAGNOSIS — C782 Secondary malignant neoplasm of pleura: Secondary | ICD-10-CM

## 2019-05-08 DIAGNOSIS — C7931 Secondary malignant neoplasm of brain: Secondary | ICD-10-CM

## 2019-05-08 DIAGNOSIS — K449 Diaphragmatic hernia without obstruction or gangrene: Secondary | ICD-10-CM

## 2019-05-08 DIAGNOSIS — C778 Secondary and unspecified malignant neoplasm of lymph nodes of multiple regions: Secondary | ICD-10-CM | POA: Diagnosis not present

## 2019-05-08 DIAGNOSIS — J90 Pleural effusion, not elsewhere classified: Secondary | ICD-10-CM

## 2019-05-08 DIAGNOSIS — K760 Fatty (change of) liver, not elsewhere classified: Secondary | ICD-10-CM | POA: Diagnosis not present

## 2019-05-08 DIAGNOSIS — I2510 Atherosclerotic heart disease of native coronary artery without angina pectoris: Secondary | ICD-10-CM

## 2019-05-08 DIAGNOSIS — C3492 Malignant neoplasm of unspecified part of left bronchus or lung: Secondary | ICD-10-CM

## 2019-05-08 DIAGNOSIS — J910 Malignant pleural effusion: Secondary | ICD-10-CM

## 2019-05-08 DIAGNOSIS — E039 Hypothyroidism, unspecified: Secondary | ICD-10-CM

## 2019-05-08 DIAGNOSIS — F329 Major depressive disorder, single episode, unspecified: Secondary | ICD-10-CM

## 2019-05-08 DIAGNOSIS — C7971 Secondary malignant neoplasm of right adrenal gland: Secondary | ICD-10-CM

## 2019-05-08 DIAGNOSIS — C3412 Malignant neoplasm of upper lobe, left bronchus or lung: Secondary | ICD-10-CM

## 2019-05-08 DIAGNOSIS — Z79899 Other long term (current) drug therapy: Secondary | ICD-10-CM | POA: Diagnosis not present

## 2019-05-08 DIAGNOSIS — K219 Gastro-esophageal reflux disease without esophagitis: Secondary | ICD-10-CM | POA: Diagnosis not present

## 2019-05-08 DIAGNOSIS — C7951 Secondary malignant neoplasm of bone: Secondary | ICD-10-CM

## 2019-05-08 DIAGNOSIS — Z7901 Long term (current) use of anticoagulants: Secondary | ICD-10-CM

## 2019-05-08 DIAGNOSIS — Z5111 Encounter for antineoplastic chemotherapy: Secondary | ICD-10-CM

## 2019-05-08 DIAGNOSIS — K5730 Diverticulosis of large intestine without perforation or abscess without bleeding: Secondary | ICD-10-CM | POA: Diagnosis not present

## 2019-05-08 DIAGNOSIS — I2699 Other pulmonary embolism without acute cor pulmonale: Secondary | ICD-10-CM

## 2019-05-08 DIAGNOSIS — I700 Atherosclerosis of aorta: Secondary | ICD-10-CM | POA: Diagnosis not present

## 2019-05-08 DIAGNOSIS — D735 Infarction of spleen: Secondary | ICD-10-CM

## 2019-05-08 LAB — CBC WITH DIFFERENTIAL (CANCER CENTER ONLY)
Abs Immature Granulocytes: 0.03 10*3/uL (ref 0.00–0.07)
Basophils Absolute: 0 10*3/uL (ref 0.0–0.1)
Basophils Relative: 0 %
Eosinophils Absolute: 0.2 10*3/uL (ref 0.0–0.5)
Eosinophils Relative: 2 %
HCT: 38.1 % (ref 36.0–46.0)
Hemoglobin: 12.5 g/dL (ref 12.0–15.0)
Immature Granulocytes: 0 %
Lymphocytes Relative: 11 %
Lymphs Abs: 0.8 10*3/uL (ref 0.7–4.0)
MCH: 28.8 pg (ref 26.0–34.0)
MCHC: 32.8 g/dL (ref 30.0–36.0)
MCV: 87.8 fL (ref 80.0–100.0)
Monocytes Absolute: 0.8 10*3/uL (ref 0.1–1.0)
Monocytes Relative: 11 %
Neutro Abs: 5.3 10*3/uL (ref 1.7–7.7)
Neutrophils Relative %: 76 %
Platelet Count: 199 10*3/uL (ref 150–400)
RBC: 4.34 MIL/uL (ref 3.87–5.11)
RDW: 12.7 % (ref 11.5–15.5)
WBC Count: 7.1 10*3/uL (ref 4.0–10.5)
nRBC: 0 % (ref 0.0–0.2)

## 2019-05-08 LAB — CMP (CANCER CENTER ONLY)
ALT: 10 U/L (ref 0–44)
AST: 22 U/L (ref 15–41)
Albumin: 3.3 g/dL — ABNORMAL LOW (ref 3.5–5.0)
Alkaline Phosphatase: 77 U/L (ref 38–126)
Anion gap: 11 (ref 5–15)
BUN: 10 mg/dL (ref 6–20)
CO2: 25 mmol/L (ref 22–32)
Calcium: 9.5 mg/dL (ref 8.9–10.3)
Chloride: 102 mmol/L (ref 98–111)
Creatinine: 0.78 mg/dL (ref 0.44–1.00)
GFR, Est AFR Am: >60 mL/min (ref >60)
GFR, Estimated: >60 mL/min (ref >60)
Glucose, Bld: 106 mg/dL — ABNORMAL HIGH (ref 70–99)
Potassium: 3.7 mmol/L (ref 3.5–5.1)
Sodium: 138 mmol/L (ref 135–145)
Total Bilirubin: 0.8 mg/dL (ref 0.3–1.2)
Total Protein: 6.5 g/dL (ref 6.5–8.1)

## 2019-05-08 MED ORDER — FENTANYL 50 MCG/HR TD PT72: 1.0000 | MEDICATED_PATCH | TRANSDERMAL | 0 refills | Status: AC

## 2019-05-08 NOTE — Telephone Encounter (Signed)
Scheduled appt per 7/27 los - mailed letter with appt date and time

## 2019-05-08 NOTE — Progress Notes (Signed)
[place] [phone]   [phone]  OFFICE PROGRESS NOTE  Tina Rossetti, MD [address] [place] [address]  DIAGNOSIS: stage IV (T2b, N3, M1 C) non-small cell lung cancer, adenocarcinoma presented with left suprahilar mass with postobstructive collapse of the left upper lobe in addition to mediastinal and left supraclavicular lymphadenopathy as well as malignant left pleural effusion with pleural-based metastasis and metastatic disease to the bone, right adrenal gland and brain diagnosed in February 2020. Molecular studies: POSITIVE for the Exon 21 [id number] mutation  POSITIVE for the Exon 18 [id number] mutation  NEGATIVE for the Exon 20 [id number] mutation   PRIOR THERAPY: 1) palliative radiotherapy to the brain as well as the metastatic bone disease and the obstructive left suprahilar mass. 2) left Pleurx catheter placement under the care of Dr. Prescott Gum.  CURRENT THERAPY: Tagrisso 80 mg p.o. daily.  First dose started January 13, 2019.  Status post 3 months of treatment.  INTERVAL HISTORY: Tina Cruz 58 y.o. female returns to the clinic today for follow-up visit.  The patient is feeling fine today with no concerning complaints except for the baseline shortness of breath and she is currently on home oxygen.  She was admitted to [facility] earlier this months with left-sided pneumonia and during her evaluation she was found to have multiple splenic infarct.  Her anticoagulation was a change it to Lovenox and she is tolerating it much better.  She continues to have pain in the left upper quadrant.  She is currently on fentanyl patch 25 mcg/hour every 3 days in addition to [facility] for breakthrough pain.  She has no recent weight loss or night sweats.  She has intermittent nausea and she is currently on Phenergan which is working better for her than Zofran.  She denied having any headache or visual changes.  During her hospitalization she  had repeat CT scan of the chest, abdomen pelvis.  She is here for evaluation and recommendation regarding her condition.  MEDICAL HISTORY: Past Medical History:  Diagnosis Date   Active smoker    quit 2016   Cancer [facility])    left lung stage IV non-cell carcinoma/adenocarcinoma with diagnosis made from left pleural effusion.     Cancer ([place])    Brain metastasis    Constipation    Depression    Dyspnea    GERD (gastroesophageal reflux disease)    H/O chronic bronchitis    History of radiation therapy 12/30/2018   PTV1 right frontal 31m// 20 Gy in 1 fraction, PTV2 Left frontal// 20 Gy in 1 fraction   Hypothyroidism    Seasonal allergies    SVD (spontaneous vaginal delivery)    x 2   Thyroid disease    hypothyroid   Wears partial dentures     ALLERGIES:  is allergic to albuterol.  MEDICATIONS:  Current Outpatient Medications  Medication Sig Dispense Refill   acetaminophen (TYLENOL) 325 MG tablet Take 325 mg by mouth every 6 (six) hours as needed for mild pain or headache.     azelastine (ASTELIN) 0.1 % nasal spray Place 1 spray into both nostrils 2 (two) times daily as needed for rhinitis. Use in each nostril as directed (Patient taking differently: Place 1 spray into both nostrils 2 (two) times daily as needed for rhinitis. ) 30 mL 12   Benzonatate 150 MG CAPS Take 1 capsule (150 mg total) by mouth 3 (three) times daily as needed (  cough). (Patient not taking: Reported on 05/01/2019) 20 capsule 1   Cetirizine HCl (ZYRTEC ALLERGY) 10 MG CAPS Take 1 capsule (10 mg total) by mouth daily as needed (allergies). (Patient taking differently: Take 1 capsule by mouth daily. ) 30 capsule 11   chlorpheniramine-HYDROcodone (TUSSIONEX) 10-8 MG/5ML SUER Take 5 mLs by mouth every 12 (twelve) hours as needed for cough. (Patient not taking: Reported on 05/01/2019) 140 mL 0   enoxaparin (LOVENOX) 120 MG/0.8ML injection Inject 0.67 mLs (100 mg total) into the skin daily. 20.1 mL 0    fentaNYL (DURAGESIC) 25 MCG/HR Place 1 patch onto the skin every 3 (three) days. 10 patch 0   levalbuterol (XOPENEX HFA) 45 MCG/ACT inhaler Inhale 1-2 puffs into the lungs every 4 (four) hours as needed for wheezing or shortness of breath. 1 Inhaler 0   levothyroxine (SYNTHROID) 112 MCG tablet Take 1 tablet (112 mcg total) by mouth daily before breakfast. 90 tablet 1   lubiprostone (AMITIZA) 24 MCG capsule Take 1 capsule (24 mcg total) by mouth 2 (two) times daily with a meal. 60 capsule 2   magnesium citrate SOLN Take 0.5 Bottles by mouth daily as needed for moderate constipation or severe constipation.     montelukast (SINGULAIR) 10 MG tablet Take 10 mg by mouth at bedtime.     Multiple Vitamin (MULTIVITAMIN WITH MINERALS) TABS tablet Take 1 tablet by mouth daily. 30 tablet 0   ondansetron (ZOFRAN ODT) 8 MG disintegrating tablet Take 1 tablet (8 mg total) by mouth every 8 (eight) hours as needed for nausea or vomiting. 45 tablet 2   oxyCODONE (OXY IR/ROXICODONE) 5 MG immediate release tablet Take 1 tablet (5 mg total) by mouth 2 (two) times daily as needed for moderate pain or severe pain. 30 tablet 0   pantoprazole (PROTONIX) 40 MG tablet Take 1 tablet (40 mg total) by mouth daily. 30 tablet 3   polyethylene glycol (MIRALAX / GLYCOLAX) packet Take 17 g by mouth daily.      promethazine (PHENERGAN) 25 MG suppository Place 1 suppository (25 mg total) rectally every 6 (six) hours as needed for nausea or vomiting. 12 each 0   TAGRISSO 80 MG tablet TAKE 1 TABLET DAILY (Patient taking differently: Take 80 mg by mouth daily. ) 30 tablet 0   No current facility-administered medications for this visit.     SURGICAL HISTORY:  Past Surgical History:  Procedure Laterality Date   COLONOSCOPY     x 2   IR PERC PLEURAL DRAIN W/INDWELL CATH W/IMG GUIDE  01/06/2019   IR REMOVAL OF PLURAL CATH W/CUFF  02/23/2019   TONSILLECTOMY     TUBAL LIGATION      REVIEW OF SYSTEMS:  Constitutional:  positive for fatigue Eyes: negative Ears, nose, mouth, throat, and face: negative Respiratory: positive for dyspnea on exertion Cardiovascular: negative Gastrointestinal: positive for abdominal pain Genitourinary:negative Integument/breast: negative Hematologic/lymphatic: negative Musculoskeletal:negative Neurological: negative Behavioral/Psych: negative Endocrine: negative Allergic/Immunologic: negative   PHYSICAL EXAMINATION: General appearance: alert, cooperative, fatigued and no distress Head: Normocephalic, without obvious abnormality, atraumatic Neck: no adenopathy, no JVD, supple, symmetrical, trachea midline and thyroid not enlarged, symmetric, no tenderness/mass/nodules Lymph nodes: Cervical, supraclavicular, and axillary nodes normal. Resp: clear to auscultation bilaterally Back: symmetric, no curvature. ROM normal. No CVA tenderness. Cardio: regular rate and rhythm, S1, S2 normal, no murmur, click, rub or gallop GI: soft, non-tender; bowel sounds normal; no masses,  no organomegaly Extremities: extremities normal, atraumatic, no cyanosis or edema Neurologic: Alert and oriented X 3,  normal strength and tone. Normal symmetric reflexes. Normal coordination and gait  ECOG PERFORMANCE STATUS: 1 - Symptomatic but completely ambulatory  Blood pressure 107/65, pulse (!) 106, temperature 98.2 F (36.8 C), height '5\' 7"'  (1.702 m), weight 145 lb 6.4 oz (66 kg), SpO2 96 %.  LABORATORY DATA: Lab Results  Component Value Date   WBC 7.1 05/08/2019   HGB 12.5 05/08/2019   HCT 38.1 05/08/2019   MCV 87.8 05/08/2019   PLT 199 05/08/2019      Chemistry      Component Value Date/Time   NA 136 04/21/2019 0623   K 3.8 04/21/2019 0623   CL 103 04/21/2019 0623   CO2 22 04/21/2019 0623   BUN 9 04/21/2019 0623   CREATININE 0.60 04/21/2019 0623   CREATININE 0.79 03/17/2019 0959   CREATININE 0.63 02/13/2019 1302      Component Value Date/Time   CALCIUM 8.1 (L) 04/21/2019 0623    ALKPHOS 77 04/16/2019 2103   AST 41 04/16/2019 2103   AST 20 03/17/2019 0959   ALT 16 04/16/2019 2103   ALT 13 03/17/2019 0959   BILITOT 0.8 04/16/2019 2103   BILITOT 0.8 03/17/2019 0959       RADIOGRAPHIC STUDIES: Ct Chest Wo Contrast  Result Date: 04/17/2019 CLINICAL DATA:  Pt s/p thoracentesis. "medical history significant of metastatic non-small cell lung cancer, with metastasis to bone, brain, right adrenal gland and malignant left pleural effusion presents with persistent nausea and vomiting since 3 a.m. today. Periumbilical pain without radiation. Chronic cough and shortness of breath, similar to baseline. EXAM: CT CHEST WITHOUT CONTRAST TECHNIQUE: Multidetector CT imaging of the chest was performed following the standard protocol without IV contrast. COMPARISON:  CT of the chest on 03/24/2019 FINDINGS: Cardiovascular: Heart size is normal. There is atherosclerotic calcification of the coronary arteries. Normal noncontrast appearance of the pulmonary arteries. There is minimal atherosclerosis of the thoracic aorta, not associated with aneurysm. Mediastinum/Nodes: The visualized portion of the thyroid gland has a normal appearance. Esophagus is normal in appearance. Enlarged LEFT supraclavicular lymph nodes, largest 1.4 centimeters. Previously, largest was 1.0 centimeters. Small mediastinal and hilar lymph nodes, none of which reach criteria for enlargement. Lungs/Pleura: There is no pneumothorax following thoracentesis. Persistent pleural collection identified posteriorly at the LEFT lung base. Although the study is not performed with intravenous contrast, suspect pleural thickening particularly at the LEFT lung base. Focal consolidations/mass in the LEFT lung apex is 1.6 x 2.2 centimeters, previously 2.3 x 1.8 centimeters. Extensive consolidations seen throughout the LEFT UPPER lobe with a extensive air bronchograms. Ground-glass opacities are identified in the LEFT LOWER lobe. The LEFT UPPER  lobe opacities are more confluent when compared with the prior study. There is focal consolidations/atelectasis at the MEDIAL RIGHT lung base, associated with air bronchograms and new since the prior study. The RIGHT lung is hyperinflated. Upper Abdomen: Contrast identified within the gallbladder and collecting systems of the kidneys. Colonic diverticulosis is noted. The adrenal glands are normal in appearance. Musculoskeletal: Numerous blastic lesions within the thoracic and UPPER lumbar spine, ribs, and LEFT scapula. Stable compression fracture of T12. IMPRESSION: 1. No pneumothorax following thoracentesis. 2. Persistent pleural collection and pleural thickening at the LEFT lung base. 3. Interval development of focal consolidation RIGHT lung base. 4. More confluent opacity within the LEFT UPPER lobe. 5. Interval enlargement of LEFT supraclavicular lymph nodes, suspicious for metastatic disease. 6. Stable compression fracture of T12. Sclerotic osseous metastases. 7. Coronary artery disease. 8. Colonic diverticulosis. 9.  Aortic atherosclerosis.  (  ICD10-I70.0) Electronically Signed   By: Nolon Nations M.D.   On: 04/17/2019 12:31   [place] Chest (pleural Effusion)  Result Date: 04/23/2019 CLINICAL DATA:  History of metastatic lung cancer with complex left-sided pleural effusion. Request made for chest ultrasound and guided thoracentesis as indicated. Note, patient underwent attempted ultrasound-guided thoracentesis on 04/17/2019 yielding only 100 cc of pleural fluid secondary to extensive loculations. EXAM: CHEST ULTRASOUND COMPARISON:  Ultrasound-guided left-sided thoracentesis-04/17/2019; chest CT-04/17/2019; 03/24/2019 FINDINGS: Sonographic evaluation of the right chest is negative for any significant pleural fluid (image 3). Sonographic evaluation of the left chest demonstrates multiple dense loculations within small left-sided pleural effusion. The number and conspicuity of the loculations has increased  compared to the 04/17/2019 thoracentesis. As such, patient not a candidate for ultrasound-guided thoracentesis. No thoracentesis attempted. IMPRESSION: Worsening loculations within small left-sided pleural effusion. Number and conspicuity of loculation/increased compared to the 04/17/2019 thoracentesis and as such the patient is not a candidate for ultrasound-guided thoracentesis. No thoracentesis attempted. Electronically Signed   By: Sandi Mariscal M.D.   On: 04/23/2019 08:45   Ct Abdomen Pelvis W Contrast  Result Date: 04/16/2019 CLINICAL DATA:  Acute abdominal pain. Nausea and vomiting. Neutropenia. EXAM: CT ABDOMEN AND PELVIS WITH CONTRAST TECHNIQUE: Multidetector CT imaging of the abdomen and pelvis was performed using the standard protocol following bolus administration of intravenous contrast. CONTRAST:  160m OMNIPAQUE IOHEXOL 300 MG/ML  SOLN COMPARISON:  Most recent imaging CT 03/24/2019, PET CT 12/12/2018 FINDINGS: Lower chest: Left pleural effusion with enhancement, similar to slightly decreased from prior exam. Unchanged compressive atelectasis and ground-glass opacities throughout the left lung base. New linear consolidation with air bronchograms in the medial right lower lobe. Hepatobiliary: Hepatic steatosis. No focal lesion. Gallbladder physiologically distended, no calcified stone. No biliary dilatation. Pancreas: 17 mm low-density lesion in the pancreatic neck with central quest occasion, unchanged parenchymal atrophy. No ductal dilatation or inflammation. Spleen: Multiple wedge-shaped low-density defects primarily in the mid lower spleen consistent with infarcts. No perisplenic fluid. Adrenals/Urinary Tract: No adrenal nodule. No hydronephrosis or perinephric edema. Small slightly wedge-shaped defect of the superior right kidney, best appreciated on sagittal image 57 series 6, not present on prior exam. Symmetric excretion on delayed phase imaging. Urinary bladder is physiologically distended  without wall thickening. Stomach/Bowel: Small hiatal hernia. Stomach physiologically distended. No bowel wall thickening, inflammatory change, or obstruction. Mild colonic diverticulosis without diverticulitis. Normal appendix. Vascular/Lymphatic: Calcified aortic atherosclerosis. No aneurysm. Patent portal vein and mesenteric vessels. No enlarged lymph nodes in the abdomen or pelvis. Reproductive: Uterus and bilateral adnexa are unremarkable. Other: No ascites. No free air or intra-abdominal fluid collection. Musculoskeletal: Known multifocal sclerotic metastatic disease which appears similar to prior exam. L4 and T12 compression fractures are unchanged from prior. Mild central depression of superior L3 may represent interval mild pathologic compression fracture. IMPRESSION: 1. Multiple splenic infarcts, new from prior exam. 2. Small wedge-shaped defect in the superior right kidney, new from prior exam. Given splenic infarcts, small infarct is favored over pyelonephritis. 3. Unchanged cystic lesion in the pancreatic neck with internal calcification. 4. Mild central depression of superior L3 is new and may reflect interval mild pathologic compression fracture. Unchanged multifocal sclerotic osseous metastatic disease. Mild L4 and T12 compression fractures are unchanged. 5. Linear consolidation in the medial right lower lobe, not seen previously, this may represent postradiation change versus pneumonia. 6. Chronic partially loculated left pleural effusion. 7. Colonic diverticulosis without diverticulitis. Aortic Atherosclerosis (ICD10-I70.0). Electronically Signed   By: MAurther LoftD.  On: 04/16/2019 23:52   Dg Chest Port 1 View  Result Date: 04/21/2019 CLINICAL DATA:  58 year old with left lung cancer, pneumonia and cough. EXAM: PORTABLE CHEST 1 VIEW COMPARISON:  Chest CT 04/17/2019 and chest radiograph 04/18/2019 FINDINGS: Persistent volume loss in the left hemithorax. There continues to be a large  amount of airspace disease and consolidation in the left lung. Left lung disease may have mildly progressed in the apical region. Right lung remains clear. Heart and mediastinum are grossly stable. Negative for pneumothorax. IMPRESSION: Persistent airspace densities and consolidation in the left lung. Minimal change but there may be slight progression in the left apical region. Electronically Signed   By: Markus Daft M.D.   On: 04/21/2019 11:58   Dg Chest Port 1 View  Result Date: 04/18/2019 CLINICAL DATA:  Pleural effusion. EXAM: PORTABLE CHEST 1 VIEW COMPARISON:  April 17, 2019 FINDINGS: Again noted is extensive consolidation throughout the left lung field. There is no pneumothorax. There is a probable small left-sided pleural effusion. The mediastinum is shifted to the left consistent with left-sided volume loss. The right lung field is essentially clear. IMPRESSION: No significant interval change. Electronically Signed   By: Constance Holster M.D.   On: 04/18/2019 22:37   Dg Chest Portable 1 View  Result Date: 04/17/2019 CLINICAL DATA:  Shortness of breath. History of lung cancer. Questionable pna on CT EXAM: PORTABLE CHEST 1 VIEW COMPARISON:  Chest CT 03/24/2019. Lung bases from abdominal CT earlier this day. FINDINGS: Increased volume loss in left hemithorax with left perihilar opacity from prior chest CT. Left pleural effusion. Grossly unchanged heart size and mediastinal contours, left heart border obscured. No pulmonary edema. The consolidation in the medial right lower lobe on CT is not seen radiographically. Right lung is otherwise clear. IMPRESSION: 1. Increased volume loss in the left hemithorax and perihilar density since prior CT. Findings may be due to post treatment related or postobstructive change, patient with known left lung cancer. Left pleural effusion. 2. The right lung base consolidation on abdominal CT earlier today is not visualized radiographically. Electronically Signed   By: Keith Rake M.D.   On: 04/17/2019 01:36   US Thoracentesis Asp Pleural Space W/img Guide  Result Date: 04/17/2019 INDICATION: Patient with history of metastatic lung cancer, recurrent malignant left pleural effusion; request made for diagnostic and therapeutic left thoracentesis. EXAM: ULTRASOUND GUIDED DIAGNOSTIC AND THERAPEUTIC LEFT THORACENTESIS MEDICATIONS: None COMPLICATIONS: None immediate. PROCEDURE: An ultrasound guided thoracentesis was thoroughly discussed with the patient and questions answered. The benefits, risks, alternatives and complications were also discussed. The patient understands and wishes to proceed with the procedure. Written consent was obtained. Ultrasound was performed to localize and mark an adequate pocket of fluid in the left chest. The area was then prepped and draped in the normal sterile fashion. 1% Lidocaine was used for local anesthesia. Under ultrasound guidance a 6 Fr Safe-T-Centesis catheter was introduced. Thoracentesis was performed. The catheter was removed and a dressing applied. FINDINGS: A total of approximately 100 cc of amber/blood-tinged fluid was removed. Samples were sent to the laboratory as requested by the clinical team. The amount of free pleural fluid was small on today's ultrasound and multiloculated. IMPRESSION: Successful ultrasound guided diagnostic and therapeutic left thoracentesis yielding 100 cc of pleural fluid. Read by: Rowe Robert, PA-C Electronically Signed   By: Jerilynn Mages.  Shick M.D.   On: 04/17/2019 11:56    ASSESSMENT AND PLAN: This is a very pleasant 58 years old white female with a  stage IV non-small cell lung cancer, adenocarcinoma with positive EGFR mutation in exon 21 ( [id number]) mutation as well as  Exon 48 [id number] mutation  The patient is status post stereotactic radiotherapy to metastatic brain lesion as well as palliative radiotherapy to the left lung mass and bone metastasis. She also had left Pleurx catheter placed recently. I had a lengthy  discussion with the patient today about her current disease stage, prognosis and treatment options.  The patient has positive for EGFR mutation in exon 21 as well as exon 18.   The patient was started on treatment with Tagrisso 80 mg p.o. daily on January 13, 2019. The patient has been tolerating this treatment well with no concerning adverse effect except for intermittent nausea. She had repeat CT scan of the chest, abdomen pelvis during her hospitalization that showed no concerning findings for disease progression except for slightly enlarged with left supraclavicular lymph node that could be secondary to her concurrent pneumonia at this time but disease progression could not be excluded. I recommended for the patient to continue her current treatment with Tagrisso with the same dose. For the splenic infarct, she will continue her current treatment with Lovenox. For the left upper quadrant abdominal pain secondary to the splenic infarct, she will continue on her pain medication but I will increase the fentanyl patch to 50 mcg/hour every 3 days and she will continue on OxyIR for breakthrough pain. I will see her back for follow-up visit in 1 months for evaluation with repeat lab work. She was advised to call immediately if she has any concerning symptoms in the interval. The patient voices understanding of current disease status and treatment options and is in agreement with the current care plan. All questions were answered. The patient knows to call the clinic with any problems, questions or concerns. We can certainly see the patient much sooner if necessary.   Disclaimer: This note was dictated with voice recognition software. Similar sounding words can inadvertently be transcribed and may not be corrected upon review.

## 2019-05-09 ENCOUNTER — Telehealth: Payer: Self-pay | Admitting: Medical Oncology

## 2019-05-09 ENCOUNTER — Other Ambulatory Visit: Payer: Self-pay | Admitting: Internal Medicine

## 2019-05-09 ENCOUNTER — Other Ambulatory Visit: Payer: Self-pay | Admitting: Medical Oncology

## 2019-05-09 DIAGNOSIS — C3492 Malignant neoplasm of unspecified part of left bronchus or lung: Secondary | ICD-10-CM

## 2019-05-09 MED ORDER — PROMETHAZINE HCL 25 MG RE SUPP: 25.0000 mg | Freq: Four times a day (QID) | RECTAL | 0 refills | Status: DC | PRN

## 2019-05-09 MED ORDER — OXYCODONE HCL 5 MG PO TABS: 5.0000 mg | ORAL_TABLET | Freq: Two times a day (BID) | ORAL | 0 refills | Status: DC | PRN

## 2019-05-09 NOTE — Telephone Encounter (Addendum)
Home Health-Susan RN from a  new agency , [facility], going out twice a week to see pt. "Tina Cruz is going down fast". Pt with uncontrolled nausea pain 9/10. She added the second 25 mcg patch today for a dose of 50 mcg. Tina Cruz requesting  oxycodone and phenergan  refills  (  last written by PCP).   She also requested hospice referral. Tina Cruz, pt sister-in law , is staying with pt 9-3 am while husband works long hours . Pt lives in a trailer and resources are limited.   Tina Cruz talked to husband over the phone and instructed him to give her phenergan supp 30 minutes prior to eating. He is NOT referring her to hospice at this time. Tina Cruz aware.   Community resources -Referral to Education officer, museum to assist with getting aide and meals on wheels.   Marland Kitchen

## 2019-05-09 NOTE — Telephone Encounter (Signed)
Pt update-Husband asking for update on pt -Dr Julien Nordmann will call him today. He does not know name of home health agency.  [employer]- LVM to return my call and identify home health provider. ( it is not [facility])

## 2019-05-10 ENCOUNTER — Telehealth: Payer: Self-pay | Admitting: Medical Oncology

## 2019-05-10 ENCOUNTER — Telehealth: Payer: Self-pay | Admitting: Family Medicine

## 2019-05-10 ENCOUNTER — Telehealth: Payer: Self-pay | Admitting: *Deleted

## 2019-05-10 MED ORDER — CEPHALEXIN 500 MG PO CAPS: 500.0000 mg | ORAL_CAPSULE | Freq: Three times a day (TID) | ORAL | 0 refills | Status: AC

## 2019-05-10 NOTE — Telephone Encounter (Signed)
Spoke with patient and informed her that we are sending Keflex 500 mg. Patient verbalized understanding.

## 2019-05-10 NOTE — Telephone Encounter (Signed)
Tina Cruz  Clinical Social Cruz was referred by medical oncology RN for assessment of psychosocial needs; specifically home care needs.  Clinical Social Worker contacted patient by phone  to offer support and assess for needs.  Patient expressed experiencing a lot of pain currently and also reported symptoms of a UTI she was getting managed by PCP.   Patient shared she has a [facility] nurse 2x week and occupational therapy.  Her sister in law helps care for her during the day while her spouse is at [place].  Mrs. Douse could not express other needs at this time and had to end call due to someone else trying to reach her.  CSW encouraged patient/family to call with questions or concerns.  RN requested assistance with home care aide (CSW explained to patient this would be an out of pocket expense) and [facility] (they are not accepting new referrals until 2021).      Gwinda Maine, LCSW  Clinical Social Worker [facility]

## 2019-05-10 NOTE — Telephone Encounter (Signed)
Go ahead and send Keflex 500mg  1 po TID for 7 days  If does not improve can then leave sample Pt high risk for COVID, active metastic cancer, will treat based on symptoms

## 2019-05-10 NOTE — Telephone Encounter (Signed)
Pt declined to pay for home aide pout of pocket . [facility] is not operating now.

## 2019-05-10 NOTE — Telephone Encounter (Signed)
Patient called in stating that for the past day she has been experiencing frequent urination with pain. She believes that she has an UTI. Denies any fevers. She would like to know could she drop in for a UA. Please advise

## 2019-05-11 ENCOUNTER — Telehealth: Payer: Self-pay | Admitting: *Deleted

## 2019-05-11 NOTE — Telephone Encounter (Signed)
TCT pt's husband, Ronalee Belts and Demetrius Revel that the prior auth for the fentanyl patches has been approved. Advised mike to stop by the [facility] in [place] to pick up the patches.  Ronalee Belts voiced understanding. He states his wife seems to have had a better day today-sleeping at interval, drinking fluids etc.  No questions at this time.

## 2019-05-11 NOTE — Telephone Encounter (Signed)
Received vm message from pt's husband, Ronalee Belts, regarding ongoing symptoms that are becoming difficult for him to manage. TCT [employer]. Spoke with him at length. The main symptoms she is confronting are lower back pain and some lower abdominal pain, nausea with some vomiting, constipation, nervousness and irritability.  He states she is not eating much though drinking fluids fairly well. Passing her urine well but she is becoming reluctant to get out of bed to go to the bathroom d/t pain and has decreased her intake somewhat. Reviewed her pain managment. He will make sure her fentanyl patches are in place, advised that the prior authorization is in process for refills of her patches.  She is on 50 mcg every 3 days.  Her breakthrough pain med is oxycodone @ 5mg  2 x a day.  She is using that 2 x a day but still has 9-10/10 pain.  Reviewed nausea meds- she has zofran 8 mg every 8 hours and phenergan 25 mg suppositories every 6 hours as needed.  She has not been taking these as often as she could. Advised to have her take her nausea meds on a regular basis for now-advised that the pain meds and the zofran can worsen the constipation. He is unclear as to when he last good BM was-at least 2-3 days. Discussed daily bowel regime with miralax if that works for her. Advised to give her either zofran or phenergan when he gets home and then after about 30 minutes, give her 1/2 bottle of magnesium citrate. If no BM after 4 hours, give the other half.  If no BM after, he is to call on call for further instructions. She is to start Keflex for presumed UTI per her PCP. Ronalee Belts has picked up the prescription and will give her the first dose today. Advised to check urine output/comfort voiding etc and to check her temperature a couple of times a day. Ronalee Belts is trying very hard to take care of her and work as well.  He does have a family friend stay with her while he is at work. He voiced understanding of the above instructions.  Also  advised that he can take her to the ED if her symptoms are not controllable at home.  He voiced understanding.  He asked that all calls come to him as Lyrik becomes very agitated and upset when she has to talk on the phone.  Mike's cell # is in Patient Care coordination notes.  Advised that  A nurse would call him in the morning to see how the night went for her. Ronalee Belts again voiced understanding and appreciation of call back.

## 2019-05-11 NOTE — Telephone Encounter (Signed)
TCT pt's husband, Tina Cruz. Spoke with him.  He states Tina Cruz is feeling a little bit better this am. She used the phenergan suppository with some relief of the nausea. No bowel movement yet-mike gave her miralax last night and this am. Pt is on the [facility] now.  Advised to use Mag Citrate I she does not have a BM. Tina Cruz states she will likely need to use oxycodone 3 x a day, but will try tylenol middday today.  He did pick up new bottle of oxycodone recently.  Tina Cruz also states that he will need refill of lovenox. [facility] [place] Pt has 10 more syringes available for pick up. Will check with Dr,. Mohamed to see who will manage refills of the lovenox.  Tina Cruz voices understanding of instructions and will call with any further concerns.  Appreciative of call this am.

## 2019-05-15 ENCOUNTER — Other Ambulatory Visit: Payer: Self-pay | Admitting: Internal Medicine

## 2019-05-15 ENCOUNTER — Telehealth: Payer: Self-pay | Admitting: Family Medicine

## 2019-05-15 MED ORDER — TRAZODONE HCL 50 MG PO TABS: 25.0000 mg | ORAL_TABLET | Freq: Every evening | ORAL | 3 refills | Status: AC | PRN

## 2019-05-15 NOTE — Telephone Encounter (Signed)
Pt needs anxiety med called in to pharmacy [facility] [place]

## 2019-05-15 NOTE — Telephone Encounter (Signed)
Try Trazadone 25-50mg  at bedtime for sleep/nerves

## 2019-05-15 NOTE — Telephone Encounter (Signed)
Call placed to patient and patient spouse made aware.   Prescription sent to pharmacy.

## 2019-05-15 NOTE — Telephone Encounter (Signed)
Per patient spouse, patient is extremely tearful and anxious. He states he would like something to help her rest.   MD please advise.

## 2019-05-16 ENCOUNTER — Ambulatory Visit: Payer: Managed Care, Other (non HMO) | Admitting: Family Medicine

## 2019-05-16 ENCOUNTER — Other Ambulatory Visit: Payer: Self-pay | Admitting: Internal Medicine

## 2019-05-16 ENCOUNTER — Telehealth: Payer: Self-pay | Admitting: Medical Oncology

## 2019-05-16 MED ORDER — OXYCODONE HCL 5 MG PO TABS: 5.0000 mg | ORAL_TABLET | Freq: Two times a day (BID) | ORAL | 0 refills | Status: AC | PRN

## 2019-05-16 NOTE — Telephone Encounter (Signed)
Pain escalating and not well controlled. Pain is on her  left side "where my spleen is located" and pain is controlled for only 3 hours. -Fentanyl patch 50 mcg ( last placed on Sunday) Percocet -taking 2 /day  Nausea-.  She is having nausea and is not sure what is causing it. She is taking phenergan supp 30 minutes prior to eating.  Per Dr Julien Nordmann I instructed pt to increase oxycodone  to every 6 hours prn. Husband notified .   Pt  needs refills   oxycodone refill for new instructions/quantity q 6 prn  phenergan refill with increased quantity requested  -current quantity only lasts 3 days.  She is also taking zofran in between.

## 2019-05-18 ENCOUNTER — Telehealth: Payer: Self-pay | Admitting: *Deleted

## 2019-05-18 NOTE — Telephone Encounter (Signed)
Received call from pt's husband, Legrand Como.  He states he called a couple of days ago requesting that pt's oxycodone needed to be increased to 3-4 x a day. Refill was sent for previous 2 x a day.  This current prescription, if given 3 x a day will only last 10 days (05/26/19)  Legrand Como is requesting next prescription to be written as every 6 hours as discussed on 05/16/19

## 2019-05-18 NOTE — Telephone Encounter (Signed)
We will change the frequency with the upcoming prescription.  He can use the current prescription every 6 hours until it is close to running out.

## 2019-05-19 ENCOUNTER — Other Ambulatory Visit: Payer: Self-pay | Admitting: Family Medicine

## 2019-05-19 ENCOUNTER — Other Ambulatory Visit: Payer: Self-pay | Admitting: Internal Medicine

## 2019-05-22 ENCOUNTER — Telehealth: Payer: Self-pay | Admitting: *Deleted

## 2019-05-22 NOTE — Telephone Encounter (Signed)
Call from [place] states " Pt and spouse have decided she needs hospice and would like to stop taking Tagrisso." Called pt and confirmed pt would like to go on Hospice and has requested MD to increase pain medication. Pt currently taking Oxycodone 5mg  IR BID.Will review with MD.

## 2019-05-22 NOTE — Telephone Encounter (Signed)
[name], MD  Physician  Specialty:  Oncology  Telephone Encounter  Signed  Encounter Date:  05/18/2019          Signed         Show:Clear all [x] Manual[] Template[] Copied  Added by: [x] [name], MD  [] Hover for details We will change the frequency with the upcoming prescription.  He can use the current prescription every 6 hours until it is close to running out         Tina Cruz. This is my previous reply on 8/6. Thank you.

## 2019-05-23 ENCOUNTER — Telehealth: Payer: Self-pay | Admitting: *Deleted

## 2019-05-23 NOTE — Telephone Encounter (Signed)
Referral to [facility] made. Pt has requested to stop Tagrisso and requested to start with Hospice services. TCT pt's husband Legrand Como and made him aware of referral. Ronalee Belts voiced his appreciation.

## 2019-05-24 ENCOUNTER — Other Ambulatory Visit: Payer: Self-pay | Admitting: *Deleted

## 2019-05-24 DIAGNOSIS — C7949 Secondary malignant neoplasm of other parts of nervous system: Secondary | ICD-10-CM

## 2019-05-24 DIAGNOSIS — C7931 Secondary malignant neoplasm of brain: Secondary | ICD-10-CM

## 2019-06-02 ENCOUNTER — Other Ambulatory Visit: Payer: Self-pay | Admitting: Family Medicine

## 2019-06-07 ENCOUNTER — Other Ambulatory Visit: Payer: Self-pay | Admitting: Family Medicine

## 2019-06-07 ENCOUNTER — Telehealth: Payer: Self-pay | Admitting: *Deleted

## 2019-06-12 ENCOUNTER — Inpatient Hospital Stay: Payer: Managed Care, Other (non HMO) | Admitting: Internal Medicine

## 2019-06-12 ENCOUNTER — Inpatient Hospital Stay: Payer: Managed Care, Other (non HMO)

## 2019-06-13 NOTE — Telephone Encounter (Signed)
Fax rcvd from [facility]. Pt passed 2019/06/12 at 12:45 am.

## 2019-06-13 DEATH — deceased

## 2019-07-10 ENCOUNTER — Other Ambulatory Visit: Payer: Managed Care, Other (non HMO)
